# Patient Record
Sex: Male | Born: 1950 | Race: Black or African American | Hispanic: No | Smoking: Current every day smoker
Health system: Southern US, Community
[De-identification: ages and names within clinical notes are randomized; demographics above are authoritative.]

## PROBLEM LIST (undated history)

## (undated) DIAGNOSIS — Z8709 Personal history of other diseases of the respiratory system: Secondary | ICD-10-CM

## (undated) DIAGNOSIS — K219 Gastro-esophageal reflux disease without esophagitis: Secondary | ICD-10-CM

## (undated) DIAGNOSIS — J45909 Unspecified asthma, uncomplicated: Secondary | ICD-10-CM

## (undated) DIAGNOSIS — I10 Essential (primary) hypertension: Secondary | ICD-10-CM

## (undated) DIAGNOSIS — F1010 Alcohol abuse, uncomplicated: Secondary | ICD-10-CM

## (undated) DIAGNOSIS — M1990 Unspecified osteoarthritis, unspecified site: Secondary | ICD-10-CM

## (undated) DIAGNOSIS — R0600 Dyspnea, unspecified: Secondary | ICD-10-CM

## (undated) DIAGNOSIS — C801 Malignant (primary) neoplasm, unspecified: Secondary | ICD-10-CM

## (undated) DIAGNOSIS — K4090 Unilateral inguinal hernia, without obstruction or gangrene, not specified as recurrent: Secondary | ICD-10-CM

## (undated) DIAGNOSIS — R3912 Poor urinary stream: Secondary | ICD-10-CM

## (undated) DIAGNOSIS — Z9189 Other specified personal risk factors, not elsewhere classified: Secondary | ICD-10-CM

## (undated) DIAGNOSIS — R351 Nocturia: Secondary | ICD-10-CM

## (undated) SURGERY — SIGMOIDOSCOPY, FLEXIBLE
Anesthesia: Monitor Anesthesia Care

---

## 1998-03-15 ENCOUNTER — Emergency Department (HOSPITAL_COMMUNITY): Admission: EM | Admit: 1998-03-15 | Discharge: 1998-03-15 | Payer: Self-pay | Admitting: Emergency Medicine

## 2006-04-28 ENCOUNTER — Inpatient Hospital Stay (HOSPITAL_COMMUNITY): Admission: EM | Admit: 2006-04-28 | Discharge: 2006-05-01 | Payer: Self-pay | Admitting: Emergency Medicine

## 2006-04-29 IMAGING — CR DG CHEST 1V PORT
1 series · 1 of 1 positions shown · non-contrast
Comparison: [DATE]

CLINICAL DATA: Fall, no pneumothorax

PORTABLE CHEST - 1 VIEW:

[view not recorded]
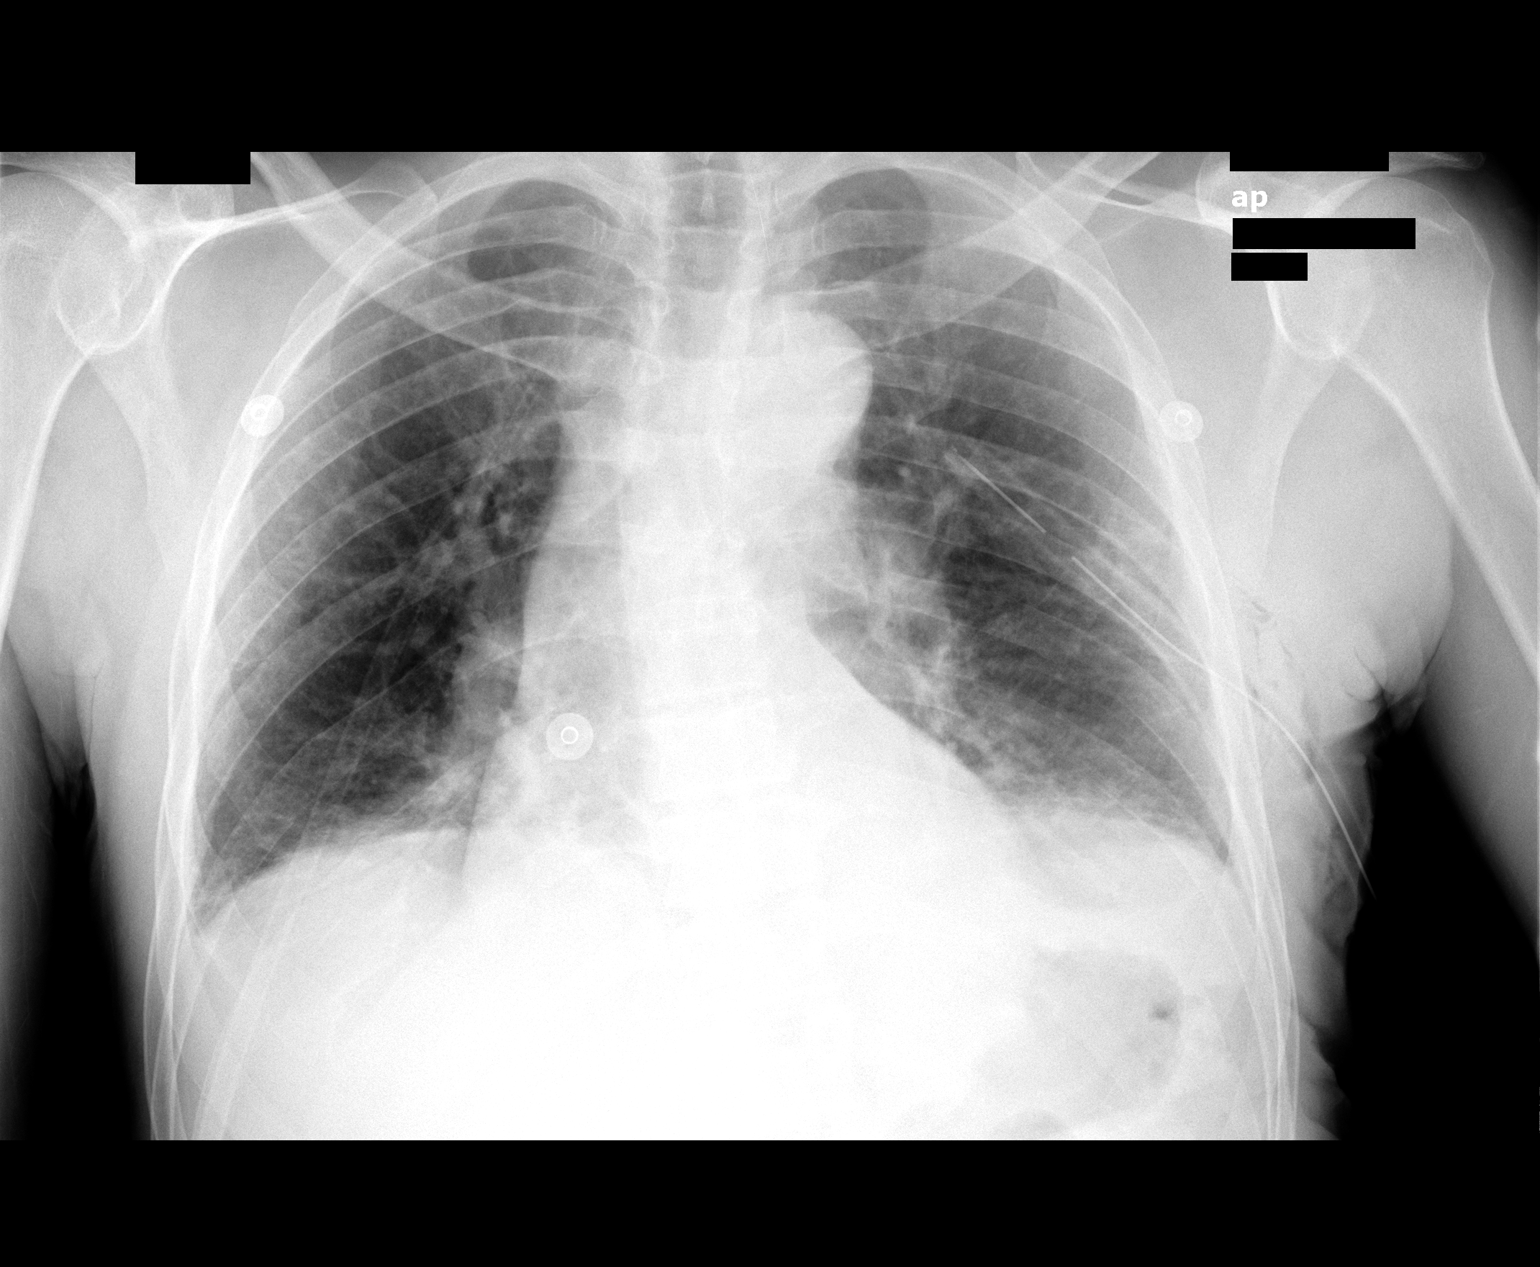

[1 of 1 positions shown; findings below may reference images not displayed]

FINDINGS: Left chest tube remains in place, unchanged. No pneumothorax. Small
amount of subcutaneous air noted on the left, unchanged. There are decreasing
lung volumes with increasing bibasilar atelectasis.
IMPRESSION: No pneumothorax.

Increasing bibasilar atelectasis.

## 2008-03-16 ENCOUNTER — Emergency Department (HOSPITAL_COMMUNITY): Admission: EM | Admit: 2008-03-16 | Discharge: 2008-03-16 | Payer: Self-pay | Admitting: Emergency Medicine

## 2010-10-26 NOTE — Op Note (Signed)
[name], [name] NO.:  [id number]   MEDICAL RECORD NO.:  [id number]          PATIENT TYPE:  INP   LOCATION:  5705                         FACILITY:  [facility]   PHYSICIAN:  Reesa Chew, MD       DATE OF BIRTH:  1951-05-26   DATE OF PROCEDURE:  DATE OF DISCHARGE:                                 OPERATIVE REPORT   PROCEDURE:  Left tube thoracostomy.   SURGEON:  Earney Hamburg, PA-C   ASSIST:  None.   ANESTHESIA:  Versed 5 mg IV, fentanyl 100 mcg IV, and 5 mL lidocaine 1%  without epinephrine subcutaneously.   ESTIMATED BLOOD LOSS:  None.   COMPLICATIONS:  None.   PROCEDURE:  The procedure, its risks and benefits were explained to the  patient, who expressed understanding and signed the consent form.  The  patient had his IV sedation administered, and he was prepped and draped in a  sterile fashion.  Following this, approximately 5 mL of lidocaine was  administered subcutaneously, periosteally, and down to the pleural line.  Then, a 1.5-cm incision was made over rib, and blunt dissection was taken  down to the pleural cavity.  The blunt hemostat was used to puncture the  pleura and was then used to widen the pleural opening.  There was an initial  rush of air from the opening.  This was removed, and a finger was inserted  and swept the chest wall and pleural lining, where there was no signs of  adherence.  Then, a 28-French chest tube with trocar was inserted into the  opening.  Once this made it through the ribs, the trocar was withdrawn and  the chest tube advanced.  This was then sutured in place and connected to a  Pleur-evac at 20 cm of suction.  The patient had a continuous air leak  noted.  Breath sounds improved afterwards, and a portable chest x-ray was  pending.      Earney Hamburg, P.A.    ______________________________  Reesa Chew, MD    MJ/MEDQ  D:  04/28/2006  T:  04/29/2006  Job:  [id number]

## 2010-10-26 NOTE — H&P (Signed)
NAMEDEIDRICK, RAINEY NO.:  [id number]   MEDICAL RECORD NO.:  [id number]          PATIENT TYPE:  INP   LOCATION:  5705                         FACILITY:  [facility]   PHYSICIAN:  Cherylynn Ridges, M.D.    DATE OF BIRTH:  [birth year]   DATE OF ADMISSION:  04/28/2006  DATE OF DISCHARGE:                                HISTORY & PHYSICAL   CHIEF COMPLAINT:  Fall.   HISTORY OF PRESENT ILLNESS:  This is a 60 year old black male who was  walking down a hill when he fell.  There was no loss of consciousness.  He  had immediate left chest wall pain.  However, he was able to get up and go  home.  This fall happened on April 27, 2006.  Over the next 24 hours he  had increasing shortness of breath and chest wall pain and came to the  emergency department for evaluation.  Chest x-ray showed a 30% pneumothorax  on the left side and we were consulted.   PAST MEDICAL HISTORY:  Negative.   SURGICAL HISTORY:  Significant for a left herniorrhaphy.   SOCIAL HISTORY:  Positive for tobacco use and alcohol use of about 40 ounces  of beer per day.  Patient works as a Health visitor.   ALLERGIES:  None.   MEDICATIONS:  None.   REVIEW OF SYSTEMS:  Left chest wall pain and shortness of breath, otherwise  negative for 15 systems.   PHYSICAL EXAM:  VITALS:  Temperature is 99.2, pulse 78, respirations 22 and  mildly labored, blood pressure 125/81, O2 sat is 93% on room air.  GENERAL:  Well-developed, well-nourished black male.  SKIN:  Was warm and dry without ecchymosis, laceration, or edema.  HEAD:  Normocephalic, atraumatic.  Eyes:  Pupils PERRL.  Extraocular  movements intact bilaterally.  No injection, hemorrhage, edema, ecchymosis,  and vision was grossly intact.  Ears:  TMs clear bilaterally.  IACs clear  bilaterally.  Oral mucosa without lesions.  Hearing grossly intact.  Face:  No lesions, edema, ecchymosis.  Facial movement and strength grossly intact.  No obvious oral  trauma or malocclusion.  NECK:  Nontender without lesions.  Range of motion grossly intact without  pain.  LUNGS:  Clear to auscultation but diminished on the left side.  Chest  excursion seemed normal and equal.  CV:  Normal S1, S2.  Regular rate and rhythm without murmurs, rubs, or  gallops.  No auscultated bruits.  Peripheral pulses were palpable.  ABDOMEN:  Soft and nontender with normoactive bowel sounds, no distention  noted.  PELVIC EXAM:  Deferred with the exception of gross pelvic girdle stability  which was intact without tenderness.  MUSCULOSKELETAL:  Patient moves all extremities and there were no deficits  in terms of sensation noted.  No tenderness or deformity.  Back exam was  normal.  NEURO:  GCS was 15.  Patient was alert and oriented x3 with no amnesia or  focal deficits.   X-RAYS:  Chest x-ray showed at least one left-sided rib fracture with a  proximal 30 to 35% pneumothorax noted on the  left side.  He also had a left  clavicle fracture which is likely old.   IMPRESSION:  1. Fall.  2. Left rib fractures with 30 to 35% pneumothorax.  3. Alcohol use.  4. Tobacco use.   PLAN:  Left chest tube will be placed.  Admit to the floor for observation  and chest tube management.      Earney Hamburg, P.A.      Cherylynn Ridges, M.D.  Electronically Signed    MJ/MEDQ  D:  04/28/2006  T:  04/28/2006  Job:  [phone] [phone]

## 2010-10-26 NOTE — Discharge Summary (Signed)
NAMEREADE, TREFZ NO.:  [id number]   MEDICAL RECORD NO.:  [id number]          PATIENT TYPE:  INP   LOCATION:  5705                         FACILITY:  [facility]   PHYSICIAN:  Cherylynn Ridges, M.D.    DATE OF BIRTH:  [birth year]   DATE OF ADMISSION:  04/28/2006  DATE OF DISCHARGE:  05/01/2006                                 DISCHARGE SUMMARY   CONSULTANTS:  None.   DISCHARGE DIAGNOSES:  1. Status post a fall.  2. Left rib fracture x1.  3. Left 30% pneumothorax.  4. EtOH abuse.  5. Tobacco abuse.   PROCEDURES:  Left tube thoracostomy per Dr. Lindie Spruce.   HISTORY ON ADMISSION:  This is a 60 year old black male who was walking down  his hill when he fell down.  There was no loss of consciousness.  He  complained of immediate left chest wall pain but was able to get up and  ambulate and go home.  The fall happened on the day prior to presentation.  He then developed increasing shortness of breath and chest wall pain and  presented to the emergency department for evaluation.  Chest x-ray showed a  left rib fracture x1 at least, and a 30% pneumothorax on the left.  Trauma  services was consulted, and the patient underwent placement of a left chest  tube.  This initially placed to suction but was able to be watersealed after  24 hours.  The chest tube was removed the day following this, and a follow-  up chest x-ray shows no residual pneumothorax.  The patient is ambulatory  and tolerating a regular diet and felt ready for discharge home.   MEDICATIONS ON DISCHARGE:  Vicodin 5/500 mg 1-2 p.o. q.6 h p.r.n. pain #60  no refill.   He will be followed up on May 15, 2006 in trauma clinic for assessment  for return to work.      Shawn Rayburn, P.A.      Cherylynn Ridges, M.D.  Electronically Signed    SR/MEDQ  D:  05/01/2006  T:  05/01/2006  Job:  [id number]

## 2011-03-12 LAB — URINALYSIS, ROUTINE W REFLEX MICROSCOPIC
Bilirubin Urine: NEGATIVE
Hgb urine dipstick: NEGATIVE
Nitrite: NEGATIVE
Protein, ur: NEGATIVE
Specific Gravity, Urine: 1.022
Urobilinogen, UA: 0.2

## 2012-01-18 ENCOUNTER — Encounter (HOSPITAL_COMMUNITY): Payer: Self-pay | Admitting: *Deleted

## 2012-01-18 ENCOUNTER — Emergency Department (HOSPITAL_COMMUNITY): Payer: No Typology Code available for payment source

## 2012-01-18 ENCOUNTER — Emergency Department (HOSPITAL_COMMUNITY): Payer: Self-pay

## 2012-01-18 ENCOUNTER — Emergency Department (HOSPITAL_COMMUNITY)
Admission: EM | Admit: 2012-01-18 | Discharge: 2012-01-18 | Disposition: A | Discharge status: Home / Self Care | Payer: No Typology Code available for payment source | Attending: Emergency Medicine | Admitting: Emergency Medicine

## 2012-01-18 DIAGNOSIS — F17200 Nicotine dependence, unspecified, uncomplicated: Secondary | ICD-10-CM | POA: Insufficient documentation

## 2012-01-18 DIAGNOSIS — R220 Localized swelling, mass and lump, head: Secondary | ICD-10-CM | POA: Insufficient documentation

## 2012-01-18 DIAGNOSIS — IMO0002 Reserved for concepts with insufficient information to code with codable children: Secondary | ICD-10-CM

## 2012-01-18 DIAGNOSIS — S01119A Laceration without foreign body of unspecified eyelid and periocular area, initial encounter: Secondary | ICD-10-CM | POA: Insufficient documentation

## 2012-01-18 DIAGNOSIS — J45909 Unspecified asthma, uncomplicated: Secondary | ICD-10-CM | POA: Insufficient documentation

## 2012-01-18 DIAGNOSIS — M25519 Pain in unspecified shoulder: Secondary | ICD-10-CM | POA: Insufficient documentation

## 2012-01-18 DIAGNOSIS — Z23 Encounter for immunization: Secondary | ICD-10-CM | POA: Insufficient documentation

## 2012-01-18 MED ORDER — SODIUM CHLORIDE 0.9 % IV BOLUS (SEPSIS)
1000.0000 mL | Freq: Once | INTRAVENOUS | Status: AC
  Administered 2012-01-18: 1000 mL via INTRAVENOUS

## 2012-01-18 MED ORDER — TETANUS-DIPHTH-ACELL PERTUSSIS 5-2.5-18.5 LF-MCG/0.5 IM SUSP
0.5000 mL | Freq: Once | INTRAMUSCULAR | Status: AC
  Administered 2012-01-18: 0.5 mL via INTRAMUSCULAR
  Filled 2012-01-18: qty 0.5

## 2012-01-18 MED ORDER — MORPHINE SULFATE 4 MG/ML IJ SOLN
4.0000 mg | Freq: Once | INTRAMUSCULAR | Status: AC
  Administered 2012-01-18: 4 mg via INTRAVENOUS
  Filled 2012-01-18: qty 1

## 2012-01-18 MED ORDER — ONDANSETRON HCL 4 MG/2ML IJ SOLN
4.0000 mg | Freq: Once | INTRAMUSCULAR | Status: AC
  Administered 2012-01-18: 4 mg via INTRAVENOUS
  Filled 2012-01-18: qty 2
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
## 2012-01-19 NOTE — ED Provider Notes (Signed)
Medical screening examination/treatment/procedure(s) were conducted as a shared visit with non-physician practitioner(s) and myself.  I personally evaluated the patient during the encounter.  Derwood Kaplan, MD 01/19/12 1721

## 2014-04-11 ENCOUNTER — Emergency Department (HOSPITAL_COMMUNITY)
Admission: EM | Admit: 2014-04-11 | Discharge: 2014-04-11 | Disposition: A | Discharge status: Home / Self Care | Payer: Medicaid Other | Attending: Emergency Medicine | Admitting: Emergency Medicine

## 2014-04-11 ENCOUNTER — Encounter (HOSPITAL_COMMUNITY): Payer: Self-pay

## 2014-04-11 DIAGNOSIS — L729 Follicular cyst of the skin and subcutaneous tissue, unspecified: Secondary | ICD-10-CM | POA: Diagnosis present

## 2014-04-11 DIAGNOSIS — D1779 Benign lipomatous neoplasm of other sites: Secondary | ICD-10-CM | POA: Insufficient documentation

## 2014-04-11 DIAGNOSIS — Z87828 Personal history of other (healed) physical injury and trauma: Secondary | ICD-10-CM | POA: Insufficient documentation

## 2014-04-11 DIAGNOSIS — Z720 Tobacco use: Secondary | ICD-10-CM | POA: Diagnosis not present

## 2014-04-11 DIAGNOSIS — D1720 Benign lipomatous neoplasm of skin and subcutaneous tissue of unspecified limb: Secondary | ICD-10-CM

## 2014-04-11 MED ORDER — TRAMADOL HCL 50 MG PO TABS: 50.0000 mg | ORAL_TABLET | Freq: Four times a day (QID) | ORAL | Status: DC | PRN

## 2014-04-11 NOTE — Discharge Instructions (Signed)
Take Tramadol as needed for pain. Refer to attached documents for more information. Follow up with a primary care provider from the resource guide below.    Emergency Department Resource Guide 1) Find a Doctor and Pay Out of Pocket Although you won't have to find out who is covered by your insurance plan, it is a good idea to ask around and get recommendations. You will then need to call the office and see if the doctor you have chosen will accept you as a new patient and what types of options they offer for patients who are self-pay. Some doctors offer discounts or will set up payment plans for their patients who do not have insurance, but you will need to ask so you aren't surprised when you get to your appointment.  2) Contact Your Local Health Department Not all health departments have doctors that can see patients for sick visits, but many do, so it is worth a call to see if yours does. If you don't know where your local health department is, you can check in your phone book. The CDC also has a tool to help you locate your state's health department, and many state websites also have listings of all of their local health departments.  3) Find a [facility] If your illness is not likely to be very severe or complicated, you may want to try a walk in clinic. These are popping up all over the country in pharmacies, drugstores, and shopping centers. They're usually staffed by nurse practitioners or physician assistants that have been trained to treat common illnesses and complaints. They're usually fairly quick and inexpensive. However, if you have serious medical issues or chronic medical problems, these are probably not your best option.  No Primary Care Doctor: - Call Health Connect at  [phone] - they can help you locate a primary care doctor that  accepts your insurance, provides certain services, etc. - Physician Referral Service- [phone]  Chronic Pain Problems: Organization          Address  Phone   Notes  [facility]  [phone] Patients need to be referred by their primary care doctor.   Medication Assistance: Organization         Address  Phone   Notes  [facility] Medication [facility] [address] [place], [place] [address] [phone] --Must be a resident of [facility] -- Must have NO insurance coverage whatsoever (no Medicaid/ Medicare, etc.) -- The pt. MUST have a primary care doctor that directs their care regularly and follows them in the community   MedAssist  [phone]   [employer]  [phone]    Agencies that provide inexpensive medical care: Organization         Address  Phone   Notes  [place]  [phone]   [facility]    [phone]   [facility] [place], [place] [address] [phone]   [place] [address] [place] [phone]   Planned Parenthood    [phone]   [facility]    [phone]   [place] and [place] [address],  Phone:  [phone], Fax:  [phone] Hours of Operation:  9 am - 6 pm, M-F.  Also accepts Medicaid/Medicare and self-pay.  [facility] for [place] [address], [address],  [place] Phone: [phone], Fax: [phone]. Hours of Operation:  8:30 am - 5:30 pm, M-F.  Also accepts Medicaid and self-pay.  [facility] [address], [place] Phone: [phone]   [place], [place], [place] [phone], Ext. [phone] Mondays & Thursdays: 7-9 AM.  First 15 patients are seen on a first come, first serve basis.    [place] Providers:  Organization         Address  Phone   Notes  [facility] [address], [place] [phone] Also accepts self-pay patients.  [facility] [address], [place]  [phone]   [place], [address], [place] [phone]   [facility] [address], [place] [phone]   Lucianne Lei [address], [place]   [phone] Only accepts [place] Access [place] patients after they have their name applied to their card.   Self-Pay (no insurance) in [facility]:  Organization         Address  Phone   Notes  Sickle Cell Patients, [facility] [place] [phone]   [facility] [place] [phone]   [facility] [place]  [place], [place], [place] [phone]   [facility]/Dr. Osei-Bonsu  [address], [place] or [address], [place] [phone] Phone number for both [place] and [place] locations is the same.  [facility] [address], [place] [phone]   [facility] [address] [place] or [address] [phone]   [facility] [address], [place] [phone], phone; [phone], fax Sees patients 1st and 3rd Saturday of every month.  Must not qualify for public or private insurance (i.e. Medicaid, Medicare, [place] Health Choice, Veterans' Benefits)  Household income should be no more than 200% of the poverty level The clinic cannot treat you if you are pregnant or think you are pregnant  Sexually transmitted diseases are not treated at the clinic.    Dental Care: Organization         Address  Phone  Notes  [facility] Department of [facility] [phone] Accepts children up to age 47 who are enrolled in [place] or [place]; pregnant women with a Medicaid card; and  children who have applied for Medicaid or [place] Health Choice, but were declined, whose parents can pay a reduced fee at time of service.  [facility] Department of Operating Room Services  [address], [place] [phone] Accepts children up to age 20 who are enrolled in [place] or [place]; pregnant women with a Medicaid card; and children who have applied for Medicaid or [place] Health Choice, but were declined, whose parents can pay a reduced fee at time of service.  [place] Adult [facility] Access PROGRAM  [place] [phone] Patients are seen by appointment only. Walk-ins are not accepted. [place] will see patients 24 years of age and older. Monday - Tuesday (8am-5pm) Most Wednesdays (8:30-5pm) $30 per visit, cash only  [place]  [place]  [address], [facility] [phone] Patients are seen by appointment only. Walk-ins are not accepted. [place] will see patients 60 years of age and older. One Wednesday Evening (Monthly: Volunteer Based).  $30 per visit, cash only  [place]  [phone] for adults; Children under age 70, call Graduate Pediatric Dentistry at [phone]. Children aged 74-14, please call [phone] to request a pediatric application.  Dental services are provided in all areas of dental care including fillings, crowns and bridges, complete and partial dentures, implants, gum treatment, root canals, and extractions. Preventive care is also provided. Treatment is provided to both adults and children. Patients are selected via a lottery and there is often a waiting list.   [facility] [address] [place]  [phone] [email or web address]   [facility] [address] [place], [place] [phone], Ext. [phone] Second and Fourth Thursday of each month, opens at 6:30 AM; Clinic ends at 9 AM.  Patients are seen on a first-come first-served  basis, and a limited number are seen during each clinic.   [facility]  [address] Hillard Danker [place], [place] [phone]   Eligibility Requirements You must have lived in [place], [place], or [place] counties for at least the last three months.   You cannot be eligible for state or federal sponsored [employer], including [employer], [place], or [employer].   You generally cannot be eligible for healthcare insurance through your employer.    How to apply: Eligibility screenings are held every Tuesday and Wednesday afternoon from 1:00 pm until 4:00 pm. You do not need an appointment for the interview!  [facility] [address] [place], [place]   [place]  [place] Department  [place]  [phone]    Behavioral Health Resources in the Community: Intensive Outpatient Programs Organization         Address  Phone  Notes  Floris Vancleave. [address] [place], [place] [phone]   [facility] [address] [place], [place]   ADS: Alcohol & Drug Svcs [address] [place], [place]   [place] [address],  [place], [place] or [phone]   Substance Abuse Resources Organization         Address  Phone  Notes  Alcohol and Drug Services  [phone]   [place]  [phone]   The [place]   Chinita Pester  [phone]   Residential & Outpatient Substance Abuse Program  [phone]   Psychological Services Organization         Address  Phone  Notes  [facility] [place]  [place]  [phone]   [place] [address], [place] or [phone]    Mobile Crisis Teams Organization          Address  Phone  Notes  [facility], Mobile Crisis Care Unit  [phone]   [facility]  [address] [place], [place]   [place] [address], [place] [phone]    Self-Help/Support Groups Organization         Address  Phone             Notes  [place]. of [place] - variety  of support groups  336- [phone] Call for more information  Narcotics Anonymous (NA), [facility] [address], [employer] [place]  2 meetings at this location   Residential Facilities manager         Address  Phone  Notes  [facility] [place],    [place]  [phone]   [facility]  [address], [place] [address], [place], [place]   [place], [place] [phone] Admissions: 8am-3pm M-F  Incentives Substance [place] [address]    [place], [place] [phone]   [facility] [address] [place], [place], [place]   [facility] [address],  [place], [place]   [facility] - Intensive Outpatient [address] Kristeen [address], [place], [place]   [facility] ([place].,  [place], [place] [phone] or [phone]   [facility] Services (RTS) [address] [place], [place] Accepts Medicaid  [facility] [place] [address],  [place] [phone] Substance Abuse/Addiction Treatment   [facility] Organization         Address  Phone  Notes  [facility]  [phone]   Domenic Schwab, PhD [address] Arlis Porta [place], [place]   [phone] or [phone]   [place], [place] [phone]   [facility] [address], [place], [place] [phone] Insurance/Medicaid/sponsorship  through [facility] [place] and Families [address] [place]                                    [place], [place] [phone] [place] [address], [place] [phone]    Dr. Adele Schilder  [phone]   [facility] Dept. 1) [address], [place] 2) [place] 3)  [place] [address], [place] [phone]  [phone]   [place] [phone] or [phone] (After Hours)
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
## 2015-01-26 ENCOUNTER — Ambulatory Visit: Payer: Self-pay | Admitting: General Surgery

## 2015-02-16 ENCOUNTER — Encounter (HOSPITAL_BASED_OUTPATIENT_CLINIC_OR_DEPARTMENT_OTHER): Payer: Self-pay | Admitting: *Deleted

## 2015-02-17 ENCOUNTER — Encounter (HOSPITAL_BASED_OUTPATIENT_CLINIC_OR_DEPARTMENT_OTHER): Payer: Self-pay | Admitting: *Deleted

## 2015-02-17 NOTE — Progress Notes (Addendum)
NPO AFTER MN. ARRIVE AT 1000. WHICH PT STATES IS COMING ON BUS.  AND HIS BROTHER Jason Jones IS PICKING HIM UP.  PT STATES IS GOING TO BACK OFF ON ALCOHOL INTAKE STARTING THIS WEEKEND, AND PT VERBALIZED UNDERSTANDING THAT IF HE CAME IN INTOXICATED , ANESTHESIA WOULD PROBABLY CANCEL HIS CASE.  NEEDS HG.  HAD PT REPEAT INSTRUCTIONS BACK TO ME, PT WAS ABLE TO RECALL THE INSTRUCTIONS.

## 2015-02-22 ENCOUNTER — Encounter (HOSPITAL_BASED_OUTPATIENT_CLINIC_OR_DEPARTMENT_OTHER)
Admission: RE | Disposition: A | Discharge status: Home / Self Care | Payer: Self-pay | Source: Ambulatory Visit | Attending: General Surgery

## 2015-02-22 ENCOUNTER — Ambulatory Visit (HOSPITAL_BASED_OUTPATIENT_CLINIC_OR_DEPARTMENT_OTHER): Payer: Medicare Other | Admitting: Anesthesiology

## 2015-02-22 ENCOUNTER — Encounter (HOSPITAL_BASED_OUTPATIENT_CLINIC_OR_DEPARTMENT_OTHER): Payer: Self-pay | Admitting: *Deleted

## 2015-02-22 ENCOUNTER — Ambulatory Visit (HOSPITAL_BASED_OUTPATIENT_CLINIC_OR_DEPARTMENT_OTHER)
Admission: RE | Admit: 2015-02-22 | Discharge: 2015-02-22 | Disposition: A | Discharge status: Home / Self Care | Payer: Medicare Other | Source: Ambulatory Visit | Attending: General Surgery | Admitting: General Surgery

## 2015-02-22 DIAGNOSIS — D1721 Benign lipomatous neoplasm of skin and subcutaneous tissue of right arm: Secondary | ICD-10-CM | POA: Diagnosis not present

## 2015-02-22 DIAGNOSIS — Z79899 Other long term (current) drug therapy: Secondary | ICD-10-CM | POA: Diagnosis not present

## 2015-02-22 DIAGNOSIS — K219 Gastro-esophageal reflux disease without esophagitis: Secondary | ICD-10-CM | POA: Diagnosis not present

## 2015-02-22 DIAGNOSIS — F17210 Nicotine dependence, cigarettes, uncomplicated: Secondary | ICD-10-CM | POA: Diagnosis not present

## 2015-02-22 DIAGNOSIS — R222 Localized swelling, mass and lump, trunk: Secondary | ICD-10-CM | POA: Diagnosis present

## 2015-02-22 HISTORY — DX: Personal history of other diseases of the respiratory system: Z87.09

## 2015-02-22 HISTORY — DX: Other specified personal risk factors, not elsewhere classified: Z91.89

## 2015-02-22 HISTORY — PX: LIPOMA EXCISION: SHX5283

## 2015-02-22 HISTORY — DX: Gastro-esophageal reflux disease without esophagitis: K21.9

## 2015-02-22 LAB — POCT HEMOGLOBIN-HEMACUE: Hemoglobin: 14.1 g/dL (ref 13.0–17.0)

## 2015-02-22 SURGERY — EXCISION LIPOMA
Anesthesia: General | Site: Shoulder | Laterality: Right

## 2015-02-22 MED ORDER — CEFAZOLIN SODIUM-DEXTROSE 2-3 GM-% IV SOLR
INTRAVENOUS | Status: AC
  Filled 2015-02-22: qty 50

## 2015-02-22 MED ORDER — KETOROLAC TROMETHAMINE 30 MG/ML IJ SOLN
INTRAMUSCULAR | Status: DC | PRN
  Administered 2015-02-22: 30 mg via INTRAVENOUS

## 2015-02-22 MED ORDER — CITRIC ACID-SODIUM CITRATE 334-500 MG/5ML PO SOLN
ORAL | Status: AC
  Filled 2015-02-22: qty 15

## 2015-02-22 MED ORDER — LIDOCAINE HCL (CARDIAC) 20 MG/ML IV SOLN
INTRAVENOUS | Status: DC | PRN
  Administered 2015-02-22: 80 mg via INTRAVENOUS

## 2015-02-22 MED ORDER — METOCLOPRAMIDE HCL 5 MG/ML IJ SOLN
INTRAMUSCULAR | Status: DC | PRN
  Administered 2015-02-22: 10 mg via INTRAVENOUS

## 2015-02-22 MED ORDER — FENTANYL CITRATE (PF) 100 MCG/2ML IJ SOLN
INTRAMUSCULAR | Status: AC
  Filled 2015-02-22: qty 4

## 2015-02-22 MED ORDER — GLYCOPYRROLATE 0.2 MG/ML IJ SOLN
INTRAMUSCULAR | Status: DC | PRN
  Administered 2015-02-22: 0.2 mg via INTRAVENOUS

## 2015-02-22 MED ORDER — FENTANYL CITRATE (PF) 100 MCG/2ML IJ SOLN
25.0000 ug | INTRAMUSCULAR | Status: DC | PRN
  Filled 2015-02-22: qty 1

## 2015-02-22 MED ORDER — CEFAZOLIN SODIUM-DEXTROSE 2-3 GM-% IV SOLR
2.0000 g | INTRAVENOUS | Status: AC
  Administered 2015-02-22: 2 g via INTRAVENOUS
  Filled 2015-02-22: qty 50

## 2015-02-22 MED ORDER — EPHEDRINE SULFATE 50 MG/ML IJ SOLN
INTRAMUSCULAR | Status: DC | PRN
  Administered 2015-02-22: 10 mg via INTRAVENOUS
  Administered 2015-02-22: 20 mg via INTRAVENOUS

## 2015-02-22 MED ORDER — MIDAZOLAM HCL 5 MG/5ML IJ SOLN
INTRAMUSCULAR | Status: DC | PRN
  Administered 2015-02-22 (×2): 1 mg via INTRAVENOUS

## 2015-02-22 MED ORDER — OXYCODONE-ACETAMINOPHEN 5-325 MG PO TABS: 1.0000 | ORAL_TABLET | ORAL | Status: DC | PRN

## 2015-02-22 MED ORDER — SUCCINYLCHOLINE CHLORIDE 20 MG/ML IJ SOLN
INTRAMUSCULAR | Status: DC | PRN
  Administered 2015-02-22: 100 mg via INTRAVENOUS

## 2015-02-22 MED ORDER — PROMETHAZINE HCL 25 MG/ML IJ SOLN
6.2500 mg | INTRAMUSCULAR | Status: DC | PRN
Start: 2015-02-22 — End: 2015-02-22
  Filled 2015-02-22: qty 1

## 2015-02-22 MED ORDER — DEXAMETHASONE SODIUM PHOSPHATE 4 MG/ML IJ SOLN
INTRAMUSCULAR | Status: DC | PRN
  Administered 2015-02-22: 10 mg via INTRAVENOUS

## 2015-02-22 MED ORDER — LACTATED RINGERS IV SOLN
INTRAVENOUS | Status: DC
  Filled 2015-02-22: qty 1000

## 2015-02-22 MED ORDER — PROPOFOL 10 MG/ML IV BOLUS
INTRAVENOUS | Status: DC | PRN
  Administered 2015-02-22: 200 mg via INTRAVENOUS

## 2015-02-22 MED ORDER — CITRIC ACID-SODIUM CITRATE 334-500 MG/5ML PO SOLN
30.0000 mL | Freq: Three times a day (TID) | ORAL | Status: DC
  Filled 2015-02-22: qty 30

## 2015-02-22 MED ORDER — CITRIC ACID-SODIUM CITRATE 334-500 MG/5ML PO SOLN
15.0000 mL | Freq: Three times a day (TID) | ORAL | Status: DC
  Administered 2015-02-22: 15 mL via ORAL
  Filled 2015-02-22: qty 15

## 2015-02-22 MED ORDER — MIDAZOLAM HCL 2 MG/2ML IJ SOLN
INTRAMUSCULAR | Status: AC
  Filled 2015-02-22: qty 2

## 2015-02-22 MED ORDER — FENTANYL CITRATE (PF) 100 MCG/2ML IJ SOLN
INTRAMUSCULAR | Status: DC | PRN
  Administered 2015-02-22: 50 ug via INTRAVENOUS
  Administered 2015-02-22 (×6): 25 ug via INTRAVENOUS

## 2015-02-22 MED ORDER — BUPIVACAINE-EPINEPHRINE (PF) 0.25% -1:200000 IJ SOLN
INTRAMUSCULAR | Status: DC | PRN
  Administered 2015-02-22: 10 mL

## 2015-02-22 MED ORDER — LACTATED RINGERS IV SOLN
INTRAVENOUS | Status: DC
  Administered 2015-02-22 (×3): via INTRAVENOUS
  Filled 2015-02-22: qty 1000

## 2015-02-22 MED ORDER — MEPERIDINE HCL 25 MG/ML IJ SOLN
6.2500 mg | INTRAMUSCULAR | Status: DC | PRN
  Filled 2015-02-22: qty 1

## 2015-02-22 MED ORDER — ONDANSETRON HCL 4 MG/2ML IJ SOLN
INTRAMUSCULAR | Status: DC | PRN
  Administered 2015-02-22: 4 mg via INTRAVENOUS

## 2015-02-22 SURGICAL SUPPLY — 42 items
APL SKNCLS STERI-STRIP NONHPOA (GAUZE/BANDAGES/DRESSINGS) ×1
BENZOIN TINCTURE PRP APPL 2/3 (GAUZE/BANDAGES/DRESSINGS) ×1 IMPLANT
BLADE HEX COATED 2.75 (ELECTRODE) ×2 IMPLANT
BLADE SURG 15 STRL LF DISP TIS (BLADE) ×1 IMPLANT
BLADE SURG 15 STRL SS (BLADE) ×2
CANISTER SUCTION 2500CC (MISCELLANEOUS) ×1 IMPLANT
CHLORAPREP W/TINT 26ML (MISCELLANEOUS) ×2 IMPLANT
COVER BACK TABLE 60X90IN (DRAPES) ×2 IMPLANT
COVER MAYO STAND STRL (DRAPES) ×2 IMPLANT
DRAPE PED LAPAROTOMY (DRAPES) ×2 IMPLANT
DRAPE UTILITY XL STRL (DRAPES) ×2 IMPLANT
DRSG TEGADERM 4X4.75 (GAUZE/BANDAGES/DRESSINGS) ×3 IMPLANT
ELECT REM PT RETURN 9FT ADLT (ELECTROSURGICAL) ×2
ELECTRODE REM PT RTRN 9FT ADLT (ELECTROSURGICAL) ×1 IMPLANT
GLOVE BIO SURGEON STRL SZ 6.5 (GLOVE) ×1 IMPLANT
GLOVE BIO SURGEON STRL SZ7 (GLOVE) ×2 IMPLANT
GLOVE INDICATOR 6.5 STRL GRN (GLOVE) ×1 IMPLANT
GLOVE INDICATOR 7.0 STRL GRN (GLOVE) ×1 IMPLANT
GLOVE INDICATOR 7.5 STRL GRN (GLOVE) ×1 IMPLANT
GOWN STRL REUS W/ TWL LRG LVL3 (GOWN DISPOSABLE) ×1 IMPLANT
GOWN STRL REUS W/TWL LRG LVL3 (GOWN DISPOSABLE) ×4
NDL HYPO 25X1 1.5 SAFETY (NEEDLE) ×1 IMPLANT
NEEDLE HYPO 25X1 1.5 SAFETY (NEEDLE) ×2 IMPLANT
NS IRRIG 500ML POUR BTL (IV SOLUTION) ×1 IMPLANT
PACK BASIN DAY SURGERY FS (CUSTOM PROCEDURE TRAY) ×2 IMPLANT
PENCIL BUTTON HOLSTER BLD 10FT (ELECTRODE) ×2 IMPLANT
SPONGE GAUZE 4X4 12PLY STER LF (GAUZE/BANDAGES/DRESSINGS) ×1 IMPLANT
SPONGE LAP 4X18 X RAY DECT (DISPOSABLE) ×1 IMPLANT
STRIP CLOSURE SKIN 1/2X4 (GAUZE/BANDAGES/DRESSINGS) ×2 IMPLANT
SUCTION FRAZIER TIP 10 FR DISP (SUCTIONS) ×1 IMPLANT
SUT MNCRL AB 4-0 PS2 18 (SUTURE) ×1 IMPLANT
SUT PROLENE 3 0 PS 2 (SUTURE) ×2 IMPLANT
SUT VIC AB 0 CT2 27 (SUTURE) ×1 IMPLANT
SUT VIC AB 2-0 SH 27 (SUTURE)
SUT VIC AB 2-0 SH 27XBRD (SUTURE) IMPLANT
SUT VIC AB 3-0 SH 27 (SUTURE)
SUT VIC AB 3-0 SH 27X BRD (SUTURE) IMPLANT
SYR BULB 3OZ (MISCELLANEOUS) ×1 IMPLANT
SYR CONTROL 10ML LL (SYRINGE) ×2 IMPLANT
TOWEL OR 17X24 6PK STRL BLUE (TOWEL DISPOSABLE) ×2 IMPLANT
TUBE CONNECTING 12X1/4 (SUCTIONS) ×1 IMPLANT
YANKAUER SUCT BULB TIP NO VENT (SUCTIONS) IMPLANT

## 2015-02-22 NOTE — H&P (Signed)
Jason Jones is an 64 y.o. male.   Chief Complaint: right shoulder mass HPI: 64 yo male with right shoulder mass. Mass has slowly enlarged over last 3 years. He notes discomfort and occasional shooting pains.  Past Medical History  Diagnosis Date  . History of pneumothorax     04-28-2006  fell, left fx rib--  resolved w/ chest tube  . Mass of skin of right shoulder   . Alcohol abuse   . GERD (gastroesophageal reflux disease)     PRN  ---  TAKES BAKING SODA IN WATER  . Poor dental hygiene     Past Surgical History  Procedure Laterality Date  . No past surgeries      History reviewed. No pertinent family history. Social History:  reports that he has been smoking Cigarettes.  He has a 67.5 pack-year smoking history. He has never used smokeless tobacco. He reports that he drinks alcohol. He reports that he uses illicit drugs (Marijuana).  Allergies: No Known Allergies  Medications Prior to Admission  Medication Sig Dispense Refill  . Multiple Vitamins-Minerals (CENTRUM SILVER ADULT 50+ PO) Take 1 tablet by mouth daily.    . traMADol (ULTRAM) 50 MG tablet Take 50 mg by mouth every 6 (six) hours as needed.      Results for orders placed or performed during the hospital encounter of 02/22/15 (from the past 48 hour(s))  Hemoglobin-hemacue, POC     Status: None   Collection Time: 02/22/15 11:04 AM  Result Value Ref Range   Hemoglobin 14.1 13.0 - 17.0 g/dL   No results found.  Review of Systems  Constitutional: Negative for fever and chills.  HENT: Negative for hearing loss and tinnitus.   Eyes: Negative for blurred vision and double vision.  Respiratory: Negative for cough and hemoptysis.   Cardiovascular: Negative for chest pain and palpitations.  Gastrointestinal: Negative for heartburn, nausea and abdominal pain.  Genitourinary: Negative for urgency.  Musculoskeletal: Negative for back pain and neck pain.  Skin: Negative for itching and rash.  Neurological: Negative for  dizziness and tingling.  Endo/Heme/Allergies: Negative for environmental allergies. Does not bruise/bleed easily.    Blood pressure 143/92, pulse 89, temperature 98.9 F (37.2 C), temperature source Oral, resp. rate 16, height 5\' 11"  (1.803 m), weight 67.359 kg (148 lb 8 oz), SpO2 98 %. Physical Exam  Constitutional: He is oriented to person, place, and time. He appears well-developed and well-nourished.  HENT:  Head: Normocephalic and atraumatic.  Eyes: Pupils are equal, round, and reactive to light.  Neck: Normal range of motion. Neck supple.  Cardiovascular: Normal rate and regular rhythm.   Respiratory: Effort normal and breath sounds normal.  GI: Soft. Bowel sounds are normal.  Musculoskeletal: Normal range of motion.  Neurological: He is alert and oriented to person, place, and time.  Skin: Skin is warm and dry. Lesion noted.  Right shoulder mass, mobile to underlying structures, 10cm in largest axis.  Psychiatric: He has a normal mood and affect. His behavior is normal.     Assessment/Plan 64 yo male with subcutaneous right shoulder mass. -discussed risks alternatives and benefits including recurrence, infection, postoperative pain, injury to joint capsule or tendons/ligaments/nerves and that this is likely not malignant. He showed understanding and agreed to proceed with surgery. -excise mass in day surgery.  Arta Bruce Kinsinger 02/22/2015, 11:13 AM

## 2015-02-22 NOTE — Anesthesia Preprocedure Evaluation (Signed)
Anesthesia Evaluation  Patient identified by MRN, date of birth, ID band Patient awake    Reviewed: Allergy & Precautions, NPO status , Patient's Chart, lab work & pertinent test results  Airway Mallampati: II  TM Distance: >3 FB Neck ROM: Full    Dental no notable dental hx. (+) Missing, Poor Dentition   Pulmonary Current Smoker,    Pulmonary exam normal breath sounds clear to auscultation       Cardiovascular negative cardio ROS Normal cardiovascular exam Rhythm:Regular Rate:Normal     Neuro/Psych negative neurological ROS  negative psych ROS   GI/Hepatic GERD  ,(+)     substance abuse  alcohol use,   Endo/Other  negative endocrine ROS  Renal/GU negative Renal ROS  negative genitourinary   Musculoskeletal negative musculoskeletal ROS (+)   Abdominal   Peds negative pediatric ROS (+)  Hematology negative hematology ROS (+)   Anesthesia Other Findings   Reproductive/Obstetrics negative OB ROS                             Anesthesia Physical Anesthesia Plan  ASA: III  Anesthesia Plan: General   Post-op Pain Management:    Induction: Intravenous, Rapid sequence and Cricoid pressure planned  Airway Management Planned: Oral ETT  Additional Equipment:   Intra-op Plan:   Post-operative Plan: Extubation in OR  Informed Consent: I have reviewed the patients History and Physical, chart, labs and discussed the procedure including the risks, benefits and alternatives for the proposed anesthesia with the patient or authorized representative who has indicated his/her understanding and acceptance.   Dental advisory given  Plan Discussed with: CRNA  Anesthesia Plan Comments: (bicitra pre-op)        Anesthesia Quick Evaluation

## 2015-02-22 NOTE — Anesthesia Postprocedure Evaluation (Signed)
  Anesthesia Post-op Note  Patient: Jason Jones  Procedure(s) Performed: Procedure(s) (LRB): RIGHT SHOULDER MASS EXCISION 15 CM SQ (Right)  Patient Location: PACU  Anesthesia Type: General  Level of Consciousness: awake and alert   Airway and Oxygen Therapy: Patient Spontanous Breathing  Post-op Pain: mild  Post-op Assessment: Post-op Vital signs reviewed, Patient's Cardiovascular Status Stable, Respiratory Function Stable, Patent Airway and No signs of Nausea or vomiting  Last Vitals:  Filed Vitals:   02/22/15 1400  BP: 133/101  Pulse: 98  Temp:   Resp: 15    Post-op Vital Signs: stable   Complications: No apparent anesthesia complications

## 2015-02-22 NOTE — Discharge Instructions (Signed)

## 2015-02-22 NOTE — Transfer of Care (Signed)
Immediate Anesthesia Transfer of Care Note  Patient: Jason Jones  Procedure(s) Performed: Procedure(s) (LRB): RIGHT SHOULDER MASS EXCISION 15 CM SQ (Right)  Patient Location: PACU  Anesthesia Type: General  Level of Consciousness: awake, sedated, patient cooperative and responds to stimulation  Airway & Oxygen Therapy: Patient Spontanous Breathing and Patient connected to face mask oxygen  Post-op Assessment: Report given to PACU RN, Post -op Vital signs reviewed and stable and Patient moving all extremities  Post vital signs: Reviewed and stable  Complications: No apparent anesthesia complications

## 2015-02-22 NOTE — Anesthesia Procedure Notes (Signed)
Procedure Name: Intubation Date/Time: 02/22/2015 12:30 PM Performed by: Justice Rocher Pre-anesthesia Checklist: Patient identified, Emergency Drugs available, Suction available and Patient being monitored Patient Re-evaluated:Patient Re-evaluated prior to inductionOxygen Delivery Method: Circle System Utilized Preoxygenation: Pre-oxygenation with 100% oxygen Intubation Type: IV induction, Rapid sequence and Cricoid Pressure applied Ventilation: Mask ventilation without difficulty Laryngoscope Size: Mac and 4 Tube type: Oral Tube size: 7.0 mm Number of attempts: 1 Airway Equipment and Method: Stylet and Oral airway Placement Confirmation: ETT inserted through vocal cords under direct vision,  positive ETCO2 and breath sounds checked- equal and bilateral Secured at: 22 cm Tube secured with: Tape Dental Injury: Teeth and Oropharynx as per pre-operative assessment

## 2015-02-22 NOTE — Op Note (Signed)
Preoperative diagnosis: R shoulder mass Postoperative diagnosis: lipoma of R shoulder  Procedure: excision of 15cm subcutaneous mass of right shoulder. Intermediate incision closure  Surgeon: Gurney Maxin, M.D.   Anesthesia: Gen.   Indications for procedure: Jason Jones is a 64 y.o. male with symptoms of R shoulder pain and slowly enlarging mass.  Description of procedure: The patient was brought into the operative suite, placed supine. Anesthesia was administered with endotracheal tube. Patient was strapped in place and foot board was secured. All pressure points were offloaded by foam padding. The patient was prepped and draped in the usual sterile fashion.  Next marcaine was infiltrated into the dermal layer and a longitudinal incision was made directly over the mass. Once past the dermis, blunt dissection was used to completely encircle the mass. There was a large fibrous capsule which was separated from the dermis and surrounding tissue with cautery. Finally, cautery was used to dissect the mass away from the fascia of the deltoid muscle without entering the muscle body. The mass was removed and sent to pathology in formalin. Hemostasis was applied with cautery. A 0 vicryl was used for a deep dermal suture and also sutured the dermal layer to the deltoid fascia in 2 places. Finally, a 4-0 monocryl was placed as a running subcuticular suture. Steristrips, gauze and tegaderm were used for dressing. Patient awoke from anesthesia and was brought to pacu in stable condition.   Findings: 15cm yellow mass  Specimen: right shoulder mass (short stitch superior, long stitch posterior)  Blood loss: <50cc  Local anesthesia: 10cc 0.25% marcaine with epi  Complications: none  Gurney Maxin, M.D. General, Bariatric, & Minimally Invasive Surgery [facility] [place]

## 2015-02-22 NOTE — OR Nursing (Signed)
Specimen: right shoulder mass: long stitch = posterior, short stitch = superior. Pathology notified of marking and written on printed requistion.

## 2015-02-23 ENCOUNTER — Encounter (HOSPITAL_BASED_OUTPATIENT_CLINIC_OR_DEPARTMENT_OTHER): Payer: Self-pay | Admitting: General Surgery

## 2017-03-27 ENCOUNTER — Ambulatory Visit: Payer: Self-pay | Admitting: General Surgery

## 2017-04-17 ENCOUNTER — Other Ambulatory Visit: Payer: Self-pay

## 2017-04-17 ENCOUNTER — Encounter (HOSPITAL_BASED_OUTPATIENT_CLINIC_OR_DEPARTMENT_OTHER): Payer: Self-pay | Admitting: *Deleted

## 2017-04-17 NOTE — Progress Notes (Signed)
NPO AFTER MN.  ARRIVE AT 0830.  GETTING  LAB WORK DONE Monday 04-21-2017 (CBCdiff, BMET).   PT ADVISED TO CUT DOWN ALCOHOL INTAKE NOW UNTIL SURGERY BECAUSE ANESTHESIA WILL POSSIBLY CANCEL HIS SURGERY IF THEY FEEL HE IS INTOXICATED.  PT VERBALIZED UNDERSTANDING OF ADVISEMENT AND INSTRUCTIONS.  PT BROTHER IS DROPPING HIM OFF AND PICKING UP AT DISCHARGE.

## 2017-04-21 ENCOUNTER — Encounter (HOSPITAL_COMMUNITY)
Admission: RE | Admit: 2017-04-21 | Discharge: 2017-04-21 | Disposition: A | Discharge status: Home / Self Care | Payer: Medicare HMO | Source: Ambulatory Visit | Attending: General Surgery | Admitting: General Surgery

## 2017-04-21 DIAGNOSIS — J449 Chronic obstructive pulmonary disease, unspecified: Secondary | ICD-10-CM | POA: Diagnosis not present

## 2017-04-21 DIAGNOSIS — Z7982 Long term (current) use of aspirin: Secondary | ICD-10-CM | POA: Diagnosis not present

## 2017-04-21 DIAGNOSIS — K469 Unspecified abdominal hernia without obstruction or gangrene: Secondary | ICD-10-CM | POA: Diagnosis present

## 2017-04-21 DIAGNOSIS — F17210 Nicotine dependence, cigarettes, uncomplicated: Secondary | ICD-10-CM | POA: Diagnosis not present

## 2017-04-21 DIAGNOSIS — K4090 Unilateral inguinal hernia, without obstruction or gangrene, not specified as recurrent: Secondary | ICD-10-CM | POA: Diagnosis not present

## 2017-04-21 LAB — BASIC METABOLIC PANEL
ANION GAP: 6 (ref 5–15)
BUN: 17 mg/dL (ref 6–20)
CALCIUM: 9.6 mg/dL (ref 8.9–10.3)
CO2: 28 mmol/L (ref 22–32)
Chloride: 106 mmol/L (ref 101–111)
Creatinine, Ser: 1.22 mg/dL (ref 0.61–1.24)
GFR calc non Af Amer: 60 mL/min — ABNORMAL LOW (ref >60)
Glucose, Bld: 101 mg/dL — ABNORMAL HIGH (ref 65–99)
Potassium: 4 mmol/L (ref 3.5–5.1)
Sodium: 140 mmol/L (ref 135–145)

## 2017-04-21 LAB — CBC WITH DIFFERENTIAL/PLATELET
BASOS ABS: 0 10*3/uL (ref 0.0–0.1)
BASOS PCT: 0 %
Eosinophils Absolute: 0.2 10*3/uL (ref 0.0–0.7)
Eosinophils Relative: 4 %
HEMATOCRIT: 44.5 % (ref 39.0–52.0)
HEMOGLOBIN: 15 g/dL (ref 13.0–17.0)
Lymphocytes Relative: 44 %
Lymphs Abs: 2.4 10*3/uL (ref 0.7–4.0)
MCH: 31.6 pg (ref 26.0–34.0)
MCHC: 33.7 g/dL (ref 30.0–36.0)
MCV: 93.7 fL (ref 78.0–100.0)
MONOS PCT: 10 %
Monocytes Absolute: 0.5 10*3/uL (ref 0.1–1.0)
NEUTROS ABS: 2.3 10*3/uL (ref 1.7–7.7)
NEUTROS PCT: 42 %
PLATELETS: 249 10*3/uL (ref 150–400)
RBC: 4.75 MIL/uL (ref 4.22–5.81)
RDW: 14.4 % (ref 11.5–15.5)
WBC: 5.5 10*3/uL (ref 4.0–10.5)

## 2017-04-23 ENCOUNTER — Ambulatory Visit (HOSPITAL_BASED_OUTPATIENT_CLINIC_OR_DEPARTMENT_OTHER)
Admission: RE | Admit: 2017-04-23 | Discharge: 2017-04-23 | Disposition: A | Discharge status: Home / Self Care | Payer: Medicare HMO | Source: Ambulatory Visit | Attending: General Surgery | Admitting: General Surgery

## 2017-04-23 ENCOUNTER — Encounter (HOSPITAL_BASED_OUTPATIENT_CLINIC_OR_DEPARTMENT_OTHER): Payer: Self-pay

## 2017-04-23 ENCOUNTER — Ambulatory Visit (HOSPITAL_BASED_OUTPATIENT_CLINIC_OR_DEPARTMENT_OTHER): Payer: Medicare HMO | Admitting: Anesthesiology

## 2017-04-23 ENCOUNTER — Encounter (HOSPITAL_BASED_OUTPATIENT_CLINIC_OR_DEPARTMENT_OTHER)
Admission: RE | Disposition: A | Discharge status: Home / Self Care | Payer: Self-pay | Source: Ambulatory Visit | Attending: General Surgery

## 2017-04-23 DIAGNOSIS — K4090 Unilateral inguinal hernia, without obstruction or gangrene, not specified as recurrent: Secondary | ICD-10-CM | POA: Diagnosis not present

## 2017-04-23 DIAGNOSIS — J449 Chronic obstructive pulmonary disease, unspecified: Secondary | ICD-10-CM | POA: Insufficient documentation

## 2017-04-23 DIAGNOSIS — F17210 Nicotine dependence, cigarettes, uncomplicated: Secondary | ICD-10-CM | POA: Diagnosis not present

## 2017-04-23 DIAGNOSIS — Z7982 Long term (current) use of aspirin: Secondary | ICD-10-CM | POA: Diagnosis not present

## 2017-04-23 HISTORY — DX: Unilateral inguinal hernia, without obstruction or gangrene, not specified as recurrent: K40.90

## 2017-04-23 HISTORY — PX: INGUINAL HERNIA REPAIR: SHX194

## 2017-04-23 HISTORY — PX: INSERTION OF MESH: SHX5868

## 2017-04-23 HISTORY — DX: Poor urinary stream: R39.12

## 2017-04-23 HISTORY — DX: Alcohol abuse, uncomplicated: F10.10

## 2017-04-23 HISTORY — DX: Unspecified osteoarthritis, unspecified site: M19.90

## 2017-04-23 HISTORY — DX: Nocturia: R35.1

## 2017-04-23 SURGERY — REPAIR, HERNIA, INGUINAL, ADULT
Anesthesia: General | Site: Inguinal | Laterality: Right

## 2017-04-23 MED ORDER — GABAPENTIN 300 MG PO CAPS
ORAL_CAPSULE | ORAL | Status: AC
  Filled 2017-04-23: qty 1

## 2017-04-23 MED ORDER — LABETALOL HCL 5 MG/ML IV SOLN
INTRAVENOUS | Status: AC
  Filled 2017-04-23: qty 4

## 2017-04-23 MED ORDER — ACETAMINOPHEN 500 MG PO TABS
ORAL_TABLET | ORAL | Status: AC
  Filled 2017-04-23: qty 2

## 2017-04-23 MED ORDER — PROPOFOL 10 MG/ML IV BOLUS
INTRAVENOUS | Status: AC
  Filled 2017-04-23: qty 40

## 2017-04-23 MED ORDER — IBUPROFEN 800 MG PO TABS: 800.0000 mg | ORAL_TABLET | Freq: Three times a day (TID) | ORAL | 0 refills | Status: DC | PRN

## 2017-04-23 MED ORDER — DEXAMETHASONE SODIUM PHOSPHATE 10 MG/ML IJ SOLN
INTRAMUSCULAR | Status: DC | PRN
  Administered 2017-04-23: 10 mg via INTRAVENOUS

## 2017-04-23 MED ORDER — MEPERIDINE HCL 25 MG/ML IJ SOLN
6.2500 mg | INTRAMUSCULAR | Status: DC | PRN
  Filled 2017-04-23: qty 1

## 2017-04-23 MED ORDER — FENTANYL CITRATE (PF) 100 MCG/2ML IJ SOLN
INTRAMUSCULAR | Status: AC
  Filled 2017-04-23: qty 2

## 2017-04-23 MED ORDER — LIDOCAINE 2% (20 MG/ML) 5 ML SYRINGE
INTRAMUSCULAR | Status: AC
  Filled 2017-04-23: qty 5

## 2017-04-23 MED ORDER — LIDOCAINE 2% (20 MG/ML) 5 ML SYRINGE
INTRAMUSCULAR | Status: DC | PRN
  Administered 2017-04-23: 100 mg via INTRAVENOUS

## 2017-04-23 MED ORDER — MIDAZOLAM HCL 2 MG/2ML IJ SOLN
0.5000 mg | Freq: Once | INTRAMUSCULAR | Status: DC | PRN
  Filled 2017-04-23: qty 2

## 2017-04-23 MED ORDER — BUPIVACAINE-EPINEPHRINE (PF) 0.5% -1:200000 IJ SOLN
INTRAMUSCULAR | Status: DC | PRN
  Administered 2017-04-23: 30 mL

## 2017-04-23 MED ORDER — SODIUM CHLORIDE 0.9 % IR SOLN
Status: DC | PRN
  Administered 2017-04-23: 500 mL

## 2017-04-23 MED ORDER — PHENYLEPHRINE 40 MCG/ML (10ML) SYRINGE FOR IV PUSH (FOR BLOOD PRESSURE SUPPORT)
PREFILLED_SYRINGE | INTRAVENOUS | Status: AC
  Filled 2017-04-23: qty 10

## 2017-04-23 MED ORDER — OXYCODONE HCL 5 MG PO TABS
5.0000 mg | ORAL_TABLET | Freq: Once | ORAL | Status: AC
  Administered 2017-04-23: 5 mg via ORAL
  Filled 2017-04-23: qty 1

## 2017-04-23 MED ORDER — ONDANSETRON HCL 4 MG/2ML IJ SOLN
INTRAMUSCULAR | Status: AC
Start: 2017-04-23 — End: ?
  Filled 2017-04-23: qty 2

## 2017-04-23 MED ORDER — HYDROCODONE-ACETAMINOPHEN 5-325 MG PO TABS: 1.0000 | ORAL_TABLET | Freq: Four times a day (QID) | ORAL | 0 refills | Status: DC | PRN

## 2017-04-23 MED ORDER — GABAPENTIN 300 MG PO CAPS
300.0000 mg | ORAL_CAPSULE | ORAL | Status: AC
  Administered 2017-04-23: 300 mg via ORAL
  Filled 2017-04-23: qty 1

## 2017-04-23 MED ORDER — CEFAZOLIN SODIUM-DEXTROSE 2-4 GM/100ML-% IV SOLN
2.0000 g | INTRAVENOUS | Status: AC
  Administered 2017-04-23: 2 g via INTRAVENOUS
  Filled 2017-04-23: qty 100

## 2017-04-23 MED ORDER — FENTANYL CITRATE (PF) 100 MCG/2ML IJ SOLN
INTRAMUSCULAR | Status: DC | PRN
  Administered 2017-04-23: 25 ug via INTRAVENOUS
  Administered 2017-04-23: 50 ug via INTRAVENOUS
  Administered 2017-04-23: 25 ug via INTRAVENOUS

## 2017-04-23 MED ORDER — MIDAZOLAM HCL 2 MG/2ML IJ SOLN
INTRAMUSCULAR | Status: AC
  Filled 2017-04-23: qty 2

## 2017-04-23 MED ORDER — PROMETHAZINE HCL 25 MG/ML IJ SOLN
6.2500 mg | INTRAMUSCULAR | Status: DC | PRN
  Filled 2017-04-23: qty 1

## 2017-04-23 MED ORDER — ACETAMINOPHEN 500 MG PO TABS
1000.0000 mg | ORAL_TABLET | ORAL | Status: AC
  Administered 2017-04-23: 1000 mg via ORAL
  Filled 2017-04-23: qty 2

## 2017-04-23 MED ORDER — PHENYLEPHRINE 40 MCG/ML (10ML) SYRINGE FOR IV PUSH (FOR BLOOD PRESSURE SUPPORT)
PREFILLED_SYRINGE | INTRAVENOUS | Status: DC | PRN
  Administered 2017-04-23: 80 ug via INTRAVENOUS

## 2017-04-23 MED ORDER — EPHEDRINE 5 MG/ML INJ
INTRAVENOUS | Status: AC
  Filled 2017-04-23: qty 10

## 2017-04-23 MED ORDER — LACTATED RINGERS IV SOLN
INTRAVENOUS | Status: DC
  Administered 2017-04-23 (×3): via INTRAVENOUS
  Filled 2017-04-23: qty 1000

## 2017-04-23 MED ORDER — FENTANYL CITRATE (PF) 100 MCG/2ML IJ SOLN
25.0000 ug | INTRAMUSCULAR | Status: DC | PRN
  Administered 2017-04-23: 50 ug via INTRAVENOUS
  Administered 2017-04-23: 25 ug via INTRAVENOUS
  Filled 2017-04-23: qty 1

## 2017-04-23 MED ORDER — LABETALOL HCL 5 MG/ML IV SOLN
5.0000 mg | INTRAVENOUS | Status: DC | PRN
  Administered 2017-04-23: 5 mg via INTRAVENOUS
  Filled 2017-04-23: qty 4

## 2017-04-23 MED ORDER — PROPOFOL 10 MG/ML IV BOLUS
INTRAVENOUS | Status: DC | PRN
  Administered 2017-04-23: 170 mg via INTRAVENOUS

## 2017-04-23 MED ORDER — OXYCODONE HCL 5 MG PO TABS
ORAL_TABLET | ORAL | Status: AC
  Filled 2017-04-23: qty 1

## 2017-04-23 MED ORDER — CEFAZOLIN SODIUM-DEXTROSE 2-4 GM/100ML-% IV SOLN
INTRAVENOUS | Status: AC
  Filled 2017-04-23: qty 100

## 2017-04-23 MED ORDER — EPHEDRINE SULFATE-NACL 50-0.9 MG/10ML-% IV SOSY
PREFILLED_SYRINGE | INTRAVENOUS | Status: DC | PRN
  Administered 2017-04-23 (×3): 10 mg via INTRAVENOUS

## 2017-04-23 MED ORDER — MIDAZOLAM HCL 2 MG/2ML IJ SOLN
2.0000 mg | Freq: Once | INTRAMUSCULAR | Status: AC
  Administered 2017-04-23: 2 mg via INTRAVENOUS
  Filled 2017-04-23: qty 2

## 2017-04-23 MED ORDER — FENTANYL CITRATE (PF) 100 MCG/2ML IJ SOLN
100.0000 ug | Freq: Once | INTRAMUSCULAR | Status: AC
  Administered 2017-04-23: 100 ug via INTRAVENOUS
  Filled 2017-04-23: qty 2

## 2017-04-23 MED ORDER — CHLORHEXIDINE GLUCONATE CLOTH 2 % EX PADS
6.0000 | MEDICATED_PAD | Freq: Once | CUTANEOUS | Status: DC
  Filled 2017-04-23: qty 6

## 2017-04-23 MED ORDER — ONDANSETRON HCL 4 MG/2ML IJ SOLN
INTRAMUSCULAR | Status: DC | PRN
  Administered 2017-04-23: 4 mg via INTRAVENOUS

## 2017-04-23 MED ORDER — DEXAMETHASONE SODIUM PHOSPHATE 10 MG/ML IJ SOLN
INTRAMUSCULAR | Status: AC
  Filled 2017-04-23: qty 1

## 2017-04-23 SURGICAL SUPPLY — 46 items
ADH SKN CLS APL DERMABOND .7 (GAUZE/BANDAGES/DRESSINGS) ×1
BLADE CLIPPER SENSICLIP SURGIC (BLADE) ×2 IMPLANT
BLADE HEX COATED 2.75 (ELECTRODE) ×2 IMPLANT
BLADE SURG 15 STRL LF DISP TIS (BLADE) ×1 IMPLANT
BLADE SURG 15 STRL SS (BLADE) ×2
CANISTER SUCT 3000ML PPV (MISCELLANEOUS) IMPLANT
CELLS DAT CNTRL 66122 CELL SVR (MISCELLANEOUS) IMPLANT
CHLORAPREP W/TINT 26ML (MISCELLANEOUS) ×2 IMPLANT
COVER BACK TABLE 60X90IN (DRAPES) ×2 IMPLANT
COVER MAYO STAND STRL (DRAPES) ×2 IMPLANT
DERMABOND ADVANCED (GAUZE/BANDAGES/DRESSINGS) ×1
DERMABOND ADVANCED .7 DNX12 (GAUZE/BANDAGES/DRESSINGS) ×1 IMPLANT
DRAIN PENROSE 18X1/4 LTX STRL (WOUND CARE) IMPLANT
DRAPE LAPAROSCOPIC ABDOMINAL (DRAPES) ×2 IMPLANT
DRAPE UTILITY XL STRL (DRAPES) ×2 IMPLANT
ELECT REM PT RETURN 9FT ADLT (ELECTROSURGICAL) ×2
ELECTRODE REM PT RTRN 9FT ADLT (ELECTROSURGICAL) ×1 IMPLANT
GLOVE BIOGEL PI IND STRL 7.0 (GLOVE) ×1 IMPLANT
GLOVE BIOGEL PI INDICATOR 7.0 (GLOVE) ×1
GLOVE SURG SS PI 7.0 STRL IVOR (GLOVE) ×2 IMPLANT
GOWN STRL REUS W/ TWL LRG LVL3 (GOWN DISPOSABLE) ×2 IMPLANT
GOWN STRL REUS W/TWL LRG LVL3 (GOWN DISPOSABLE) ×4
KIT RM TURNOVER CYSTO AR (KITS) ×2 IMPLANT
MESH BARD SOFT 3X6IN (Mesh General) ×1 IMPLANT
NDL HYPO 25X1 1.5 SAFETY (NEEDLE) ×1 IMPLANT
NEEDLE HYPO 25X1 1.5 SAFETY (NEEDLE) IMPLANT
NS IRRIG 500ML POUR BTL (IV SOLUTION) ×2 IMPLANT
PACK BASIN DAY SURGERY FS (CUSTOM PROCEDURE TRAY) ×2 IMPLANT
PAD ARMBOARD 7.5X6 YLW CONV (MISCELLANEOUS) ×1 IMPLANT
PENCIL BUTTON HOLSTER BLD 10FT (ELECTRODE) ×2 IMPLANT
RETRACTOR WND ALEXIS 18 MED (MISCELLANEOUS) IMPLANT
RTRCTR WOUND ALEXIS 18CM MED (MISCELLANEOUS)
SPONGE LAP 4X18 X RAY DECT (DISPOSABLE) ×1 IMPLANT
SUT MNCRL AB 4-0 PS2 18 (SUTURE) ×2 IMPLANT
SUT PROLENE 2 0 CT2 30 (SUTURE) ×4 IMPLANT
SUT SILK 3 0 TIES 17X18 (SUTURE)
SUT SILK 3-0 18XBRD TIE BLK (SUTURE) IMPLANT
SUT VIC AB 2-0 SH 27 (SUTURE) ×4
SUT VIC AB 2-0 SH 27XBRD (SUTURE) ×1 IMPLANT
SUT VIC AB 3-0 SH 27 (SUTURE) ×2
SUT VIC AB 3-0 SH 27X BRD (SUTURE) ×1 IMPLANT
SYR BULB IRRIGATION 50ML (SYRINGE) ×2 IMPLANT
SYR CONTROL 10ML LL (SYRINGE) ×1 IMPLANT
TOWEL OR 17X24 6PK STRL BLUE (TOWEL DISPOSABLE) ×3 IMPLANT
TUBE CONNECTING 12X1/4 (SUCTIONS) ×2 IMPLANT
YANKAUER SUCT BULB TIP NO VENT (SUCTIONS) ×2 IMPLANT

## 2017-04-23 NOTE — Op Note (Signed)
Preop diagnosis: right inguinal hernia  Postop diagnosis: right indirect inguinal hernia  Procedure: open Right inguinal hernia repair with mesh  Surgeon: Gurney Maxin, M.D.  Asst: none  Anesthesia: Gen.   Indications for procedure: Jason Jones is a 66 y.o. male with symptoms of pain and enlarging Right inguinal hernia(s). After discussing risks, alternatives and benefits he decided on open repair and was brought to day surgery for repair. He underwent preoperative block by anesthesia.  Description of procedure: The patient was brought into the operative suite, placed supine. Anesthesia was administered with endotracheal tube. Patient was strapped in place. The patient was prepped and draped in the usual sterile fashion.  The anterior superior iliac spine and pubic tubercle were identified on the Right side. An incision was made 1cm above the connecting line, representative of the location of the inguinal ligament. The subcutaneous tissue was bluntly dissected, scarpa's fascia was dissected away. The external abdominal oblique fascia was identified and sharply opened down to the external inguinal ring. The conjoint tendon and inguinal ligament were identified. The cord structures and sac were dissected free of the surrounding tissue in 360 degrees. A penrose drain was used to encircle the contents. The cremasteric fibers were dissected free of the contents of the cord and hernia sac. The cord structures (vessels and vas deferens) were identified and carefully dissected away from the hernia sac. The hernia sac was fatty in nature. The hernia sac was dissected down to the internal inguinal ring. Preperitoneal fat was identified showing appropriate dissection. The hernia sac was opened to ensure reduction and ligated and a running 2-0 used to close the sac. The sac was then reduced into the preperitoneal space. A 3x6 Bard Soft mesh was then used to close the defect and reinforce the floor. The mesh  was sutured to the lacunar ligament and inguinal ligament using a 2-0 prolene in running fashion. Next the superior edge of the mesh was sutured to the conjoined tendon using a 2-0 running Prolene. An additional 2-0 Prolene was used to suture the tail ends of the mesh together re-creating the deep ring. Cord structures are running in a neutral position through the mesh. Next the external abdominal oblique fascia was closed with a 2-0 Vicryl in running fashion to re-create the external inguinal ring. Scarpa's fascia was closed with 3-0 Vicryl in running fashion. Skin was closed with a 4-0 Monocryl subcuticular stitch in running fashion. Dermabond place for dressing. Patient woke from anesthesia and brought to PACU in stable condition. All counts are correct.    Findings: right indirect inguinal hernia  Specimen: none  Blood loss: <30 ml  Local anesthesia: none  Complications: none  Implant: bard soft 3x6in mesh  Gurney Maxin, M.D. General, Bariatric, & Minimally Invasive Surgery [facility], [place] 11:37 AM 04/23/2017

## 2017-04-23 NOTE — H&P (Signed)
Jason Jones is an 66 y.o. male.   Chief Complaint: hernia HPI: 66 yo male with mutliple years of hernia. It has been enlarging and more painful and he presents for repair.  Past Medical History:  Diagnosis Date  . Arthritis   . Chronic alcohol abuse   . GERD (gastroesophageal reflux disease)    PRN  ---  TAKES BAKING SODA IN WATER  . History of pneumothorax    04-28-2006  fell, left fx rib--  resolved w/ chest tube  . Nocturia   . Poor dental hygiene   . Right inguinal hernia   . Weak urinary stream     History reviewed. No pertinent surgical history.  History reviewed. No pertinent family history. Social History:  reports that he has been smoking cigarettes.  He has a 7.00 pack-year smoking history. he has never used smokeless tobacco. He reports that he drinks alcohol. He reports that he uses drugs. Drug: Marijuana.  Allergies: No Known Allergies  Medications Prior to Admission  Medication Sig Dispense Refill  . aspirin 325 MG EC tablet Take 325 mg every 6 (six) hours as needed by mouth for pain.    Marland Kitchen loratadine (ALLERGY) 10 MG tablet Take 10 mg daily as needed by mouth for allergies.    . Multiple Vitamins-Minerals (CENTRUM SILVER ADULT 50+ PO) Take 1 tablet by mouth daily.    . traMADol (ULTRAM) 50 MG tablet Take 50 mg by mouth every 6 (six) hours as needed.      Results for orders placed or performed during the hospital encounter of 04/23/17 (from the past 48 hour(s))  Basic metabolic panel     Status: Abnormal   Collection Time: 04/21/17  9:57 AM  Result Value Ref Range   Sodium 140 135 - 145 mmol/L   Potassium 4.0 3.5 - 5.1 mmol/L   Chloride 106 101 - 111 mmol/L   CO2 28 22 - 32 mmol/L   Glucose, Bld 101 (H) 65 - 99 mg/dL   BUN 17 6 - 20 mg/dL   Creatinine, Ser 1.22 0.61 - 1.24 mg/dL   Calcium 9.6 8.9 - 10.3 mg/dL   GFR calc non Af Amer 60 (L) >60 mL/min   GFR calc Af Amer >60 >60 mL/min    Comment: (NOTE) The eGFR has been calculated using the CKD EPI  equation. This calculation has not been validated in all clinical situations. eGFR's persistently <60 mL/min signify possible Chronic Kidney Disease.    Anion gap 6 5 - 15  CBC WITH DIFFERENTIAL     Status: None   Collection Time: 04/21/17  9:57 AM  Result Value Ref Range   WBC 5.5 4.0 - 10.5 K/uL   RBC 4.75 4.22 - 5.81 MIL/uL   Hemoglobin 15.0 13.0 - 17.0 g/dL   HCT 44.5 39.0 - 52.0 %   MCV 93.7 78.0 - 100.0 fL   MCH 31.6 26.0 - 34.0 pg   MCHC 33.7 30.0 - 36.0 g/dL   RDW 14.4 11.5 - 15.5 %   Platelets 249 150 - 400 K/uL   Neutrophils Relative % 42 %   Neutro Abs 2.3 1.7 - 7.7 K/uL   Lymphocytes Relative 44 %   Lymphs Abs 2.4 0.7 - 4.0 K/uL   Monocytes Relative 10 %   Monocytes Absolute 0.5 0.1 - 1.0 K/uL   Eosinophils Relative 4 %   Eosinophils Absolute 0.2 0.0 - 0.7 K/uL   Basophils Relative 0 %   Basophils Absolute 0.0 0.0 - 0.1  K/uL   No results found.  Review of Systems  Constitutional: Negative for chills and fever.  HENT: Negative for hearing loss.   Eyes: Negative for blurred vision and double vision.  Respiratory: Negative for cough and hemoptysis.   Cardiovascular: Negative for chest pain and palpitations.  Gastrointestinal: Negative for abdominal pain, nausea and vomiting.  Genitourinary: Negative for dysuria and urgency.  Musculoskeletal: Negative for myalgias and neck pain.  Skin: Negative for itching and rash.  Neurological: Negative for dizziness, tingling and headaches.  Endo/Heme/Allergies: Does not bruise/bleed easily.  Psychiatric/Behavioral: Negative for depression and suicidal ideas.    Blood pressure (!) 142/93, pulse 84, temperature 98.4 F (36.9 C), temperature source Oral, resp. rate 16, height 5' 11" (1.803 m), weight 66.2 kg (146 lb), SpO2 99 %. Physical Exam  Vitals reviewed. Constitutional: He is oriented to person, place, and time. He appears well-developed and well-nourished.  HENT:  Head: Normocephalic and atraumatic.  Eyes:  Conjunctivae and EOM are normal. Pupils are equal, round, and reactive to light.  Neck: Normal range of motion. Neck supple.  Cardiovascular: Normal rate and regular rhythm.  Respiratory: Effort normal and breath sounds normal.  GI: Soft. Bowel sounds are normal. He exhibits no distension. There is no tenderness.  Right inguinal hernia  Musculoskeletal: Normal range of motion.  Neurological: He is alert and oriented to person, place, and time.  Skin: Skin is warm and dry.  Psychiatric: He has a normal mood and affect. His behavior is normal.     Assessment/Plan 66 yo male with right inguinal hernia -open right inguinal hernia repair with mesh -planned outpatient procedure -He continues to smoke a small amount and he is aware this puts him at a higher risk for recurrence and other complications  Mickeal Skinner, MD 04/23/2017, 8:54 AM

## 2017-04-23 NOTE — Anesthesia Procedure Notes (Signed)
Procedure Name: LMA Insertion Date/Time: 04/23/2017 10:46 AM Performed by: Bonney Aid, CRNA Pre-anesthesia Checklist: Patient identified, Emergency Drugs available, Suction available and Patient being monitored Patient Re-evaluated:Patient Re-evaluated prior to induction Oxygen Delivery Method: Circle system utilized Preoxygenation: Pre-oxygenation with 100% oxygen Induction Type: IV induction Ventilation: Mask ventilation without difficulty LMA: LMA inserted LMA Size: 4.0 Number of attempts: 1 Airway Equipment and Method: Bite block Placement Confirmation: positive ETCO2 Tube secured with: Tape Dental Injury: Teeth and Oropharynx as per pre-operative assessment

## 2017-04-23 NOTE — Addendum Note (Signed)
Addendum  created 04/23/17 1251 by Annye Asa, MD   Order list changed

## 2017-04-23 NOTE — Anesthesia Procedure Notes (Signed)
Anesthesia Regional Block: TAP block   Pre-Anesthetic Checklist: ,, timeout performed, Correct Patient, Correct Site, Correct Laterality, Correct Procedure, Correct Position, site marked, Risks and benefits discussed,  Surgical consent,  Pre-op evaluation,  At surgeon's request and post-op pain management  Laterality: Right and Lower  Prep: chloraprep       Needles:  Injection technique: Single-shot  Needle Type: Echogenic Needle     Needle Length: 9cm  Needle Gauge: 21     Additional Needles:   Procedures:,,,, ultrasound used (permanent image in chart),,,,  Narrative:  Start time: 04/23/2017 10:10 AM End time: 04/23/2017 10:18 AM Injection made incrementally with aspirations every 5 mL.  Performed by: Personally  Anesthesiologist: Annye Asa, MD  Additional Notes: Pt identified in Holding room.  Monitors applied. Working IV access confirmed. Sterile prep R flank.  #21ga ECHOgenic needle into TAP with US guidance.  30cc 0.5% Bupivacaine with 1:200k epi injected incrementally after negative test dose.  Patient asymptomatic, VSS, no heme aspirated, tolerated well.  Raechel Ache, MD assisted.  Jenita Seashore, MD

## 2017-04-23 NOTE — Anesthesia Preprocedure Evaluation (Addendum)
Anesthesia Evaluation  Patient identified by MRN, date of birth, ID band Patient awake    Reviewed: Allergy & Precautions, NPO status , Patient's Chart, lab work & pertinent test results  History of Anesthesia Complications Negative for: history of anesthetic complications  Airway Mallampati: II  TM Distance: >3 FB Neck ROM: Full    Dental  (+) Edentulous Upper, Edentulous Lower   Pulmonary COPD, Current Smoker,    breath sounds clear to auscultation       Cardiovascular negative cardio ROS   Rhythm:Regular Rate:Normal     Neuro/Psych negative neurological ROS  negative psych ROS   GI/Hepatic GERD  Controlled,(+)     substance abuse  alcohol use and marijuana use,   Endo/Other  negative endocrine ROS  Renal/GU negative Renal ROS     Musculoskeletal   Abdominal   Peds  Hematology negative hematology ROS (+)   Anesthesia Other Findings   Reproductive/Obstetrics                            Anesthesia Physical Anesthesia Plan  ASA: II  Anesthesia Plan: General   Post-op Pain Management: GA combined w/ Regional for post-op pain   Induction: Intravenous  PONV Risk Score and Plan: 1 and Ondansetron and Dexamethasone  Airway Management Planned: LMA  Additional Equipment:   Intra-op Plan:   Post-operative Plan:   Informed Consent: I have reviewed the patients History and Physical, chart, labs and discussed the procedure including the risks, benefits and alternatives for the proposed anesthesia with the patient or authorized representative who has indicated his/her understanding and acceptance.   Dental advisory given  Plan Discussed with: CRNA and Surgeon  Anesthesia Plan Comments: (Plan routine monitors, GA- LMA OK, TAP block for post op analgesia)        Anesthesia Quick Evaluation

## 2017-04-23 NOTE — Discharge Instructions (Signed)

## 2017-04-23 NOTE — Progress Notes (Signed)
1017 monitors on with O2 at 2l/m time out for block with Dr. Jenita Seashore.  1018 Versed 2 mg and Fent. 100 mcg IV given for sedation. 1035 block comlpeted. Tolerated well.

## 2017-04-23 NOTE — Transfer of Care (Signed)
Immediate Anesthesia Transfer of Care Note  Patient: Jason Jones  Procedure(s) Performed: OPEN RIGHT INGUINAL HERNIA REPAIR WITH MESH (Right Inguinal) INSERTION OF MESH (Right Inguinal)  Patient Location: PACU  Anesthesia Type:General  Level of Consciousness: sedated and responds to stimulation  Airway & Oxygen Therapy: Patient Spontanous Breathing and Patient connected to nasal cannula oxygen  Post-op Assessment: Report given to RN  Post vital signs: Reviewed and stable  Last Vitals: 98% Vitals:   04/23/17 1027 04/23/17 1032  BP: 133/86 (!) 132/96  Pulse: (!) 52 (!) 53  Resp: 13 12  Temp:    SpO2:      Last Pain:  Vitals:   04/23/17 0834  TempSrc: Oral      Patients Stated Pain Goal: 2 (45/99/77 4142)  Complications: No apparent anesthesia complications

## 2017-04-23 NOTE — Anesthesia Postprocedure Evaluation (Signed)
Anesthesia Post Note  Patient: Wayman Hoard  Procedure(s) Performed: OPEN RIGHT INGUINAL HERNIA REPAIR WITH MESH (Right Inguinal) INSERTION OF MESH (Right Inguinal)     Patient location during evaluation: PACU Anesthesia Type: General and Regional Level of consciousness: awake and alert, patient cooperative and oriented Pain management: pain level controlled (pt eating, states is comfortable) Vital Signs Assessment: post-procedure vital signs reviewed and stable Respiratory status: spontaneous breathing, nonlabored ventilation, respiratory function stable and patient connected to nasal cannula oxygen Cardiovascular status: blood pressure returned to baseline and stable Postop Assessment: no apparent nausea or vomiting Anesthetic complications: no    Last Vitals:  Vitals:   04/23/17 1215 04/23/17 1230  BP: (!) 153/115 (!) 154/110  Pulse: 84 87  Resp: 19 16  Temp:    SpO2: 97% 98%    Last Pain:  Vitals:   04/23/17 1215  TempSrc:   PainSc: 8                  Yumiko Alkins,E. Loni Abdon

## 2017-04-24 ENCOUNTER — Encounter (HOSPITAL_BASED_OUTPATIENT_CLINIC_OR_DEPARTMENT_OTHER): Payer: Self-pay | Admitting: General Surgery

## 2020-04-20 ENCOUNTER — Other Ambulatory Visit: Payer: Self-pay | Admitting: Registered Nurse

## 2020-04-24 ENCOUNTER — Other Ambulatory Visit: Payer: Self-pay | Admitting: Registered Nurse

## 2020-04-24 DIAGNOSIS — R0989 Other specified symptoms and signs involving the circulatory and respiratory systems: Secondary | ICD-10-CM

## 2020-04-24 DIAGNOSIS — B351 Tinea unguium: Secondary | ICD-10-CM

## 2020-05-01 ENCOUNTER — Ambulatory Visit
Admission: RE | Admit: 2020-05-01 | Discharge: 2020-05-01 | Disposition: A | Discharge status: Home / Self Care | Payer: Medicare Other | Source: Ambulatory Visit | Attending: Registered Nurse | Admitting: Registered Nurse

## 2020-05-01 DIAGNOSIS — B351 Tinea unguium: Secondary | ICD-10-CM

## 2020-05-01 DIAGNOSIS — R0989 Other specified symptoms and signs involving the circulatory and respiratory systems: Secondary | ICD-10-CM

## 2020-12-07 ENCOUNTER — Other Ambulatory Visit: Payer: Self-pay | Admitting: Urology

## 2020-12-07 DIAGNOSIS — R972 Elevated prostate specific antigen [PSA]: Secondary | ICD-10-CM

## 2020-12-21 ENCOUNTER — Other Ambulatory Visit: Payer: Self-pay

## 2020-12-21 ENCOUNTER — Ambulatory Visit
Admission: RE | Admit: 2020-12-21 | Discharge: 2020-12-21 | Disposition: A | Discharge status: Home / Self Care | Payer: Medicare Other | Source: Ambulatory Visit | Attending: Urology | Admitting: Urology

## 2020-12-21 DIAGNOSIS — R972 Elevated prostate specific antigen [PSA]: Secondary | ICD-10-CM

## 2020-12-21 MED ORDER — GADOBENATE DIMEGLUMINE 529 MG/ML IV SOLN
14.0000 mL | Freq: Once | INTRAVENOUS | Status: AC | PRN
  Administered 2020-12-21: 14 mL via INTRAVENOUS

## 2021-01-08 HISTORY — PX: COLONOSCOPY: SHX174

## 2021-02-19 NOTE — Progress Notes (Signed)
GU Location of Tumor / Histology: Prostate Ca  If Prostate Cancer, Gleason Score is (4 + 3) and PSA is (13.30)   Biopsies    Past/Anticipated interventions by urology, if any:   Dr. Gilford Rile:   Consultation with Dr. Louis Meckel to discuss RALP.  Consultation with Dr. Tammi Klippel to discuss radiation treatment options.  F/U 03/13/2021.  Past/Anticipated interventions by medical oncology, if any:   IPSS:  7 SHIM: 6  Weight changes, if any: Patient states possibly loss/gain 20lbs over 90 days.  Bowel/Bladder complaints, if any: Constipation mostly is taking Colace.  Patient is taking Tamsulosin Bid and he has gross hematuria.   Nausea/Vomiting, if any: No  Pain issues, if any:  0  SAFETY ISSUES: Prior radiation? No Pacemaker/ICD? No Possible current pregnancy? No Is the patient on methotrexate? No  Current Complaints / other details:

## 2021-02-20 ENCOUNTER — Encounter: Payer: Self-pay | Admitting: Radiation Oncology

## 2021-02-20 ENCOUNTER — Ambulatory Visit
Admission: RE | Admit: 2021-02-20 | Discharge: 2021-02-20 | Disposition: A | Discharge status: Home / Self Care | Payer: Medicare Other | Source: Ambulatory Visit | Attending: Radiation Oncology | Admitting: Radiation Oncology

## 2021-02-20 ENCOUNTER — Other Ambulatory Visit: Payer: Self-pay

## 2021-02-20 VITALS — BP 124/72 | HR 99 | Temp 97.4°F | Resp 20 | Ht 71.0 in | Wt 150.1 lb

## 2021-02-20 DIAGNOSIS — C61 Malignant neoplasm of prostate: Secondary | ICD-10-CM

## 2021-02-20 NOTE — Progress Notes (Signed)
Radiation Oncology         [phone] ________________________________  Initial Outpatient Consultation  Name: Jason Jones MRN: [id number]  Date: 02/20/2021  DOB: 28-Jan-1951  [id number], Maudie Mercury, NP  Davis Gourd*   REFERRING PHYSICIAN: Davis Gourd*  DIAGNOSIS: 70 y.o. gentleman with Stage T1c adenocarcinoma of the prostate with Gleason score of 4+3, and PSA of 13.3.    ICD-10-CM   1. Malignant neoplasm of prostate ([place])  C61       HISTORY OF PRESENT ILLNESS: Jason Jones is a 70 y.o. male with a diagnosis of prostate cancer. He has been followed by Dr. Lovena Neighbours since at least 2019 for an elevated PSA in the 9 range. He previously underwent prostate biopsy on 05/18/18, which was entirely benign. PSA at the time of biopsy was 9.46. Since that time, his PSA reached 13.4 in 05/2020 where it has remained stable-elevated, most recently 13.3 in 11/2020. He underwent prostate MRI on 12/21/20 showing  two small PI-RADS 4 lesions bilaterally in the peripheral zone as well as a PI-RADS 3 lesion in the right peripheral zone.  The prostate volume was estimated at 99.55 cc. The patient proceeded to MRI fusion biopsy of the prostate on 01/23/21.  The prostate volume measured 81 cc by ultrasound.  Out of 21 core biopsies, 3 were positive.  The maximum Gleason score was 4+3, and this was seen in the right mid. Additionally, Gleason 3+4 was seen in the left base lateral, and Gleason 3+3 in the left apex lateral. All 9/9 ROI MRI lesion samples were benign.  The patient reviewed the biopsy results with his urologist and he has kindly been referred today for discussion of potential radiation treatment options.   PREVIOUS RADIATION THERAPY: No  PAST MEDICAL HISTORY:  Past Medical History:  Diagnosis Date   Arthritis    Chronic alcohol abuse    GERD (gastroesophageal reflux disease)    PRN  ---  TAKES BAKING SODA IN WATER   History of pneumothorax    04-28-2006  fell, left fx rib--   resolved w/ chest tube   Nocturia    Poor dental hygiene    Right inguinal hernia    Weak urinary stream       PAST SURGICAL HISTORY: Past Surgical History:  Procedure Laterality Date   COLONOSCOPY  01/08/2021   INGUINAL HERNIA REPAIR Right 04/23/2017   Procedure: OPEN RIGHT INGUINAL HERNIA REPAIR WITH MESH;  Surgeon: Kinsinger, Arta Bruce, MD;  Location: [place];  Service: General;  Laterality: Right;  GENERAL COMBINED WITH REGIONAL FOR POST OP PAIN    INSERTION OF MESH Right 04/23/2017   Procedure: INSERTION OF MESH;  Surgeon: Kinsinger, Arta Bruce, MD;  Location: [place];  Service: General;  Laterality: Right;  GENERAL COMBINED WITH REGIONAL FOR POST OP PAIN    LIPOMA EXCISION Right 02/22/2015   Procedure: RIGHT SHOULDER MASS EXCISION 15 CM SQ;  Surgeon: Arta Bruce Kinsinger, MD;  Location: [place];  Service: General;  Laterality: Right;    FAMILY HISTORY:  Family History  Problem Relation Age of Onset   Prostate cancer Cousin    Prostate cancer Cousin     SOCIAL HISTORY:  Social History   Socioeconomic History   Marital status: Divorced    Spouse name: Not on file   Number of children: Not on file   Years of education: Not on file   Highest education level: Not on file  Occupational History  Not on file  Tobacco Use   Smoking status: Every Day    Packs/day: 1.00    Years: 7.00    Pack years: 7.00    Types: Cigarettes   Smokeless tobacco: Never  Substance and Sexual Activity   Alcohol use: Yes    Comment: 40 beer and liquor per day or every other day   Drug use: Not Currently    Types: Marijuana    Comment: occasional use (per pt once every 4-5 months )   Sexual activity: Not on file  Other Topics Concern   Not on file  Social History Narrative   Not on file   Social Determinants of Health   Financial Resource Strain: Not on file  Food Insecurity: Not on file  Transportation Needs: Not on file   Physical Activity: Not on file  Stress: Not on file  Social Connections: Not on file  Intimate Partner Violence: Not At Risk   Fear of Current or Ex-Partner: No   Emotionally Abused: No   Physically Abused: No   Sexually Abused: No    ALLERGIES: Seasonal ic [cholestatin]  MEDICATIONS:  Current Outpatient Medications  Medication Sig Dispense Refill   amLODipine (NORVASC) 2.5 MG tablet Take 2.5 mg by mouth daily.     aspirin 325 MG EC tablet Take 325 mg every 6 (six) hours as needed by mouth for pain.     fluticasone (FLONASE) 50 MCG/ACT nasal spray SMARTSIG:1-2 Spray(s) Both Nares Daily PRN     HYDROcodone-acetaminophen (NORCO/VICODIN) 5-325 MG tablet Take 1-2 tablets every 6 (six) hours as needed by mouth for moderate pain. 30 tablet 0   ibuprofen (ADVIL,MOTRIN) 800 MG tablet Take 1 tablet (800 mg total) every 8 (eight) hours as needed by mouth. 30 tablet 0   loratadine (CLARITIN) 10 MG tablet Take 10 mg daily as needed by mouth for allergies.     meloxicam (MOBIC) 15 MG tablet Take 15 mg by mouth daily.     Multiple Vitamins-Minerals (CENTRUM SILVER ADULT 50+ PO) Take 1 tablet by mouth daily.     tamsulosin (FLOMAX) 0.4 MG CAPS capsule Take 0.4 mg by mouth every 12 (twelve) hours.     traMADol (ULTRAM) 50 MG tablet Take 50 mg by mouth every 6 (six) hours as needed.     No current facility-administered medications for this encounter.    REVIEW OF SYSTEMS:  On review of systems, the patient reports that he is doing well overall. He denies any chest pain, shortness of breath, cough, fevers, chills, night sweats, unintended weight changes. He denies abdominal pain, nausea or vomiting. He denies any new musculoskeletal or joint aches or pains. His IPSS was 7, indicating mild urinary symptoms with nocturia x3 despite taking Flomax BID as prescribed. He reports some residual hematuria from biopsy, now just an intermittent, scant pink tinge in the urine.  He also reports constipation, for  which he is using Colace prn. His SHIM was 6, indicating he has severe erectile dysfunction. A complete review of systems is obtained and is otherwise negative.    PHYSICAL EXAM:  Wt Readings from Last 3 Encounters:  02/20/21 150 lb 2 oz (68.1 kg)  04/23/17 146 lb (66.2 kg)  02/22/15 148 lb 8 oz (67.4 kg)   Temp Readings from Last 3 Encounters:  02/20/21 (!) 97.4 F (36.3 C) (Temporal)  04/23/17 97.7 F (36.5 C)  02/22/15 97.9 F (36.6 C) (Oral)   BP Readings from Last 3 Encounters:  02/20/21 124/72  04/23/17  117/85  02/22/15 (!) 144/81   Pulse Readings from Last 3 Encounters:  02/20/21 99  04/23/17 (!) 45  02/22/15 90   Pain Assessment Pain Score: 0-No pain/10  In general this is a well appearing African-American male in no acute distress. He's alert and oriented x4 and appropriate throughout the examination. Cardiopulmonary assessment is negative for acute distress, and he exhibits normal effort.     KPS = 100  100 - Normal; no complaints; no evidence of disease. 90   - Able to carry on normal activity; minor signs or symptoms of disease. 80   - Normal activity with effort; some signs or symptoms of disease. 75   - Cares for self; unable to carry on normal activity or to do active work. 60   - Requires occasional assistance, but is able to care for most of his personal needs. 50   - Requires considerable assistance and frequent medical care. 51   - Disabled; requires special care and assistance. 70   - Severely disabled; hospital admission is indicated although death not imminent. 60   - Very sick; hospital admission necessary; active supportive treatment necessary. 10   - Moribund; fatal processes progressing rapidly. 0     - Dead  Karnofsky DA, Abelmann [place], Craver LS and Burchenal [facility] [phone]) The use of the nitrogen mustards in the palliative treatment of carcinoma: with particular reference to bronchogenic carcinoma Cancer 1 634-56  LABORATORY DATA:  Lab Results   Component Value Date   WBC 5.5 04/21/2017   HGB 15.0 04/21/2017   HCT 44.5 04/21/2017   MCV 93.7 04/21/2017   PLT 249 04/21/2017   Lab Results  Component Value Date   NA 140 04/21/2017   K 4.0 04/21/2017   CL 106 04/21/2017   CO2 28 04/21/2017   No results found for: ALT, AST, GGT, ALKPHOS, BILITOT   RADIOGRAPHY: No results found.    IMPRESSION/PLAN: 1. 70 y.o. gentleman with Stage T1c adenocarcinoma of the prostate with Gleason Score of 4+3, and PSA of 13.3. We discussed the patient's workup and outlined the nature of prostate cancer in this setting. The patient's T stage, Gleason's score, and PSA put him into the unfavorable intermediate risk group. Accordingly, he is eligible for a variety of potential treatment options including brachytherapy, 5.5 weeks of external radiation, or prostatectomy. We discussed the available radiation techniques, and focused on the details and logistics of delivery. The patient is not felt to be an ideal candidate for brachytherapy with a prostate volume of 80-90 cc and LUTS despite maximal medical therapy. We discussed and outlined the risks, benefits, short and long-term effects associated with radiotherapy and compared and contrasted these with prostatectomy. We discussed the role of SpaceOAR gel in reducing the rectal toxicity associated with radiotherapy.  He appears to have a good understanding of his disease and our treatment recommendations which are of curative intent.  He was encouraged to ask questions that were answered to his stated satisfaction.  At the conclusion of our conversation, the patient is leaning towards moving forward with 5.5 weeks of prostate IMRT but plans to keep his scheduled consult visit with Dr. Louis Meckel on 03/13/21 to discuss RALP prior to making his final decision. We will share our discussion with Dr. Lovena Neighbours and look forward to following along in his care. He has our contact information and will let us know if he ultimately  decides to proceed with radiotherapy and we can coordinate for fiducial markers and SpaceOAR gel  at that time. He did mention that he would prefer to have those placed in the outpatient surgical setting, under anesthesia, if he decides to move forward with radiation.   We personally spent 70 minutes in this encounter including chart review, reviewing radiological studies, meeting face-to-face with the patient, entering orders and completing documentation.    Nicholos Johns, PA-C    Tyler Pita, MD  [facility] Direct Dial: [phone]  Fax: [phone] [email or web address]  Skype  LinkedIn   This document serves as a record of services personally performed by Tyler Pita, MD and Freeman Caldron, PA-C. It was created on their behalf by Wilburn Mylar, a trained medical scribe. The creation of this record is based on the scribe's personal observations and the provider's statements to them. This document has been checked and approved by the attending provider.

## 2021-03-19 ENCOUNTER — Telehealth: Payer: Self-pay | Admitting: *Deleted

## 2021-03-19 NOTE — Telephone Encounter (Signed)
RETURNED PATIENT'S PHONE CALL, SPOKE WITH PATIENT. ?

## 2021-03-29 ENCOUNTER — Other Ambulatory Visit: Payer: Self-pay | Admitting: Urology

## 2021-03-29 DIAGNOSIS — C61 Malignant neoplasm of prostate: Secondary | ICD-10-CM

## 2021-03-30 ENCOUNTER — Other Ambulatory Visit (HOSPITAL_COMMUNITY): Payer: Self-pay | Admitting: Radiation Oncology

## 2021-03-30 ENCOUNTER — Other Ambulatory Visit: Payer: Self-pay | Admitting: Radiation Oncology

## 2021-03-30 DIAGNOSIS — C61 Malignant neoplasm of prostate: Secondary | ICD-10-CM

## 2021-04-06 ENCOUNTER — Ambulatory Visit
Admission: RE | Admit: 2021-04-06 | Discharge: 2021-04-06 | Disposition: A | Discharge status: Home / Self Care | Payer: Medicare Other | Source: Ambulatory Visit | Attending: Radiation Oncology | Admitting: Radiation Oncology

## 2021-04-06 ENCOUNTER — Other Ambulatory Visit: Payer: Self-pay

## 2021-04-06 ENCOUNTER — Telehealth: Payer: Self-pay | Admitting: *Deleted

## 2021-04-06 ENCOUNTER — Ambulatory Visit
Admission: RE | Admit: 2021-04-06 | Discharge: 2021-04-06 | Disposition: A | Discharge status: Home / Self Care | Payer: Medicare Other | Source: Ambulatory Visit | Attending: Urology | Admitting: Urology

## 2021-04-06 ENCOUNTER — Encounter: Payer: Self-pay | Admitting: Urology

## 2021-04-06 VITALS — BP 143/89 | HR 100 | Temp 96.4°F | Resp 20 | Ht 71.0 in | Wt 150.5 lb

## 2021-04-06 DIAGNOSIS — Z8042 Family history of malignant neoplasm of prostate: Secondary | ICD-10-CM | POA: Diagnosis not present

## 2021-04-06 DIAGNOSIS — K219 Gastro-esophageal reflux disease without esophagitis: Secondary | ICD-10-CM | POA: Insufficient documentation

## 2021-04-06 DIAGNOSIS — C61 Malignant neoplasm of prostate: Secondary | ICD-10-CM | POA: Insufficient documentation

## 2021-04-06 DIAGNOSIS — F17210 Nicotine dependence, cigarettes, uncomplicated: Secondary | ICD-10-CM | POA: Insufficient documentation

## 2021-04-06 DIAGNOSIS — F1010 Alcohol abuse, uncomplicated: Secondary | ICD-10-CM | POA: Diagnosis not present

## 2021-04-06 DIAGNOSIS — R3912 Poor urinary stream: Secondary | ICD-10-CM | POA: Insufficient documentation

## 2021-04-06 DIAGNOSIS — K449 Diaphragmatic hernia without obstruction or gangrene: Secondary | ICD-10-CM | POA: Diagnosis not present

## 2021-04-06 DIAGNOSIS — M129 Arthropathy, unspecified: Secondary | ICD-10-CM | POA: Insufficient documentation

## 2021-04-06 NOTE — Progress Notes (Signed)
Radiation Oncology         [phone] ________________________________  Outpatient Re-Evaluation  Name: Jason Jones MRN: [id number]  Date: 04/06/2021  DOB: 10-Oct-1950  [id number], Maudie Mercury, NP  Ardis Hughs, MD   REFERRING PHYSICIAN: Ardis Hughs, MD  DIAGNOSIS: 70 y.o. gentleman with Stage T1c adenocarcinoma of the prostate with Gleason score of 4+3, and PSA of 13.3.    ICD-10-CM   1. Malignant neoplasm of prostate ([place])  C61       HISTORY OF PRESENT ILLNESS: Jason Jones is a 70 y.o. male with a diagnosis of prostate cancer. He has been followed by Dr. Lovena Neighbours since at least 2019 for an elevated PSA in the 9 range. He previously underwent prostate biopsy on 05/18/18, which was entirely benign. PSA at the time of biopsy was 9.46. Since that time, his PSA reached 13.4 in 05/2020 where it has remained stable-elevated, most recently 13.3 in 11/2020. He underwent prostate MRI on 12/21/20 showing  two small PI-RADS 4 lesions bilaterally in the peripheral zone as well as a PI-RADS 3 lesion in the right peripheral zone.  The prostate volume was estimated at 99.55 cc. The patient proceeded to MRI fusion biopsy of the prostate on 01/23/21.  The prostate volume measured 81 cc by ultrasound.  Out of 21 core biopsies, 3 were positive.  The maximum Gleason score was 4+3, and this was seen in the right mid. Additionally, Gleason 3+4 was seen in the left base lateral, and Gleason 3+3 in the left apex lateral. All 9/9 ROI MRI lesion samples were benign.  We met with the patient on 02/20/21. At that time, he was undecided but leaning towards proceeding with external beam radiotherapy. Since that time, he met with Dr. Louis Meckel to discuss RALP and following their discussion, he is most interested in radiation treatment.  The patient has kindly been referred back today to review radiation treatment options.  PREVIOUS RADIATION THERAPY: No  PAST MEDICAL HISTORY:  Past Medical History:  Diagnosis  Date   Arthritis    Chronic alcohol abuse    GERD (gastroesophageal reflux disease)    PRN  ---  TAKES BAKING SODA IN WATER   History of pneumothorax    04-28-2006  fell, left fx rib--  resolved w/ chest tube   Nocturia    Poor dental hygiene    Right inguinal hernia    Weak urinary stream       PAST SURGICAL HISTORY: Past Surgical History:  Procedure Laterality Date   COLONOSCOPY  01/08/2021   INGUINAL HERNIA REPAIR Right 04/23/2017   Procedure: OPEN RIGHT INGUINAL HERNIA REPAIR WITH MESH;  Surgeon: Kinsinger, Arta Bruce, MD;  Location: [place];  Service: General;  Laterality: Right;  GENERAL COMBINED WITH REGIONAL FOR POST OP PAIN    INSERTION OF MESH Right 04/23/2017   Procedure: INSERTION OF MESH;  Surgeon: Kinsinger, Arta Bruce, MD;  Location: [place];  Service: General;  Laterality: Right;  GENERAL COMBINED WITH REGIONAL FOR POST OP PAIN    LIPOMA EXCISION Right 02/22/2015   Procedure: RIGHT SHOULDER MASS EXCISION 15 CM SQ;  Surgeon: Arta Bruce Kinsinger, MD;  Location: [place];  Service: General;  Laterality: Right;    FAMILY HISTORY:  Family History  Problem Relation Age of Onset   Prostate cancer Cousin    Prostate cancer Cousin     SOCIAL HISTORY:  Social History   Socioeconomic History   Marital status: Divorced  Spouse name: Not on file   Number of children: Not on file   Years of education: Not on file   Highest education level: Not on file  Occupational History   Not on file  Tobacco Use   Smoking status: Every Day    Packs/day: 1.00    Years: 7.00    Pack years: 7.00    Types: Cigarettes   Smokeless tobacco: Never  Substance and Sexual Activity   Alcohol use: Yes    Comment: 40 beer and liquor per day or every other day   Drug use: Not Currently    Types: Marijuana    Comment: occasional use (per pt once every 4-5 months )   Sexual activity: Not on file  Other Topics Concern   Not on  file  Social History Narrative   Not on file   Social Determinants of Health   Financial Resource Strain: Not on file  Food Insecurity: Not on file  Transportation Needs: Not on file  Physical Activity: Not on file  Stress: Not on file  Social Connections: Not on file  Intimate Partner Violence: Not At Risk   Fear of Current or Ex-Partner: No   Emotionally Abused: No   Physically Abused: No   Sexually Abused: No    ALLERGIES: Seasonal ic [cholestatin]  MEDICATIONS:  Current Outpatient Medications  Medication Sig Dispense Refill   amLODipine (NORVASC) 2.5 MG tablet Take 2.5 mg by mouth daily.     aspirin 325 MG EC tablet Take 325 mg every 6 (six) hours as needed by mouth for pain.     fluticasone (FLONASE) 50 MCG/ACT nasal spray SMARTSIG:1-2 Spray(s) Both Nares Daily PRN     HYDROcodone-acetaminophen (NORCO/VICODIN) 5-325 MG tablet Take 1-2 tablets every 6 (six) hours as needed by mouth for moderate pain. 30 tablet 0   ibuprofen (ADVIL,MOTRIN) 800 MG tablet Take 1 tablet (800 mg total) every 8 (eight) hours as needed by mouth. 30 tablet 0   loratadine (CLARITIN) 10 MG tablet Take 10 mg daily as needed by mouth for allergies.     meloxicam (MOBIC) 15 MG tablet Take 15 mg by mouth daily.     Multiple Vitamins-Minerals (CENTRUM SILVER ADULT 50+ PO) Take 1 tablet by mouth daily.     tamsulosin (FLOMAX) 0.4 MG CAPS capsule Take 0.4 mg by mouth every 12 (twelve) hours.     traMADol (ULTRAM) 50 MG tablet Take 50 mg by mouth every 6 (six) hours as needed.     No current facility-administered medications for this encounter.    REVIEW OF SYSTEMS:  On review of systems, the patient reports that he is doing well overall. He denies any chest pain, shortness of breath, cough, fevers, chills, night sweats, unintended weight changes. He denies abdominal pain, nausea or vomiting. He denies any new musculoskeletal or joint aches or pains. His IPSS was 2 today, indicating mild urinary symptoms. He  is taking Flomax BID as prescribed. He reports upper abdominal pain, rated 4/10 today. A complete review of systems is obtained and is otherwise negative.    PHYSICAL EXAM:  Wt Readings from Last 3 Encounters:  04/06/21 150 lb 8 oz (68.3 kg)  02/20/21 150 lb 2 oz (68.1 kg)  04/23/17 146 lb (66.2 kg)   Temp Readings from Last 3 Encounters:  04/06/21 (!) 96.4 F (35.8 C) (Temporal)  02/20/21 (!) 97.4 F (36.3 C) (Temporal)  04/23/17 97.7 F (36.5 C)   BP Readings from Last 3 Encounters:  04/06/21 Marland Kitchen)  143/89  02/20/21 124/72  04/23/17 117/85   Pulse Readings from Last 3 Encounters:  04/06/21 100  02/20/21 99  04/23/17 (!) 45   Pain Assessment Pain Score: 0-No pain/10  In general this is a well appearing African-American male in no acute distress. He's alert and oriented x4 and appropriate throughout the examination. Cardiopulmonary assessment is negative for acute distress, and he exhibits normal effort.     KPS = 100  100 - Normal; no complaints; no evidence of disease. 90   - Able to carry on normal activity; minor signs or symptoms of disease. 80   - Normal activity with effort; some signs or symptoms of disease. 29   - Cares for self; unable to carry on normal activity or to do active work. 60   - Requires occasional assistance, but is able to care for most of his personal needs. 50   - Requires considerable assistance and frequent medical care. 47   - Disabled; requires special care and assistance. 70   - Severely disabled; hospital admission is indicated although death not imminent. 49   - Very sick; hospital admission necessary; active supportive treatment necessary. 10   - Moribund; fatal processes progressing rapidly. 0     - Dead  Karnofsky DA, Abelmann [place], Craver LS and Burchenal [facility] [phone]) The use of the nitrogen mustards in the palliative treatment of carcinoma: with particular reference to bronchogenic carcinoma Cancer 1 634-56  LABORATORY DATA:  Lab  Results  Component Value Date   WBC 5.5 04/21/2017   HGB 15.0 04/21/2017   HCT 44.5 04/21/2017   MCV 93.7 04/21/2017   PLT 249 04/21/2017   Lab Results  Component Value Date   NA 140 04/21/2017   K 4.0 04/21/2017   CL 106 04/21/2017   CO2 28 04/21/2017   No results found for: ALT, AST, GGT, ALKPHOS, BILITOT   RADIOGRAPHY: No results found.    IMPRESSION/PLAN: 1. 70 y.o. gentleman with Stage T1c adenocarcinoma of the prostate with Gleason Score of 4+3, and PSA of 13.3. We discussed the patient's workup and outlined the nature of prostate cancer in this setting. The patient's T stage, Gleason's score, and PSA put him into the unfavorable intermediate risk group. Accordingly, he is eligible for a variety of potential treatment options including brachytherapy, 5.5 weeks of external radiation, or prostatectomy. We again discussed the available radiation techniques, and focused on the details and logistics of delivery. The patient is not felt to be an ideal candidate for brachytherapy with a prostate volume of 100 cc and LUTS despite maximal medical therapy. We discussed and outlined the risks, benefits, short and long-term effects associated with radiotherapy and compared and contrasted these with prostatectomy. We discussed the role of SpaceOAR gel in reducing the rectal toxicity associated with radiotherapy. He was encouraged to ask questions that were answered to his stated satisfaction.  At the conclusion of our conversation, the patient is interested in proceeding with 5.5 weeks of external beam therapy.  We will share our discussion with Dr. Lovena Neighbours and proceed as planned with fiducial markers and SpaceOAR gel placement in the outpatient surgical setting, under anesthesia on 05/23/21, prior to simulation/treatment planning on 05/25/21, to reduce rectal toxicity from radiotherapy. The patient appears to have a good understanding of his disease and our treatment recommendations which are of  curative intent and is in agreement with the stated plan.  Therefore, we will move forward with treatment planning accordingly, in anticipation of beginning IMRT in  the near future.   We personally spent 45 minutes in this encounter including chart review, reviewing radiological studies, meeting face-to-face with the patient, entering orders and completing documentation.    Nicholos Johns, PA-C    Tyler Pita, MD  [facility] Direct Dial: [phone]  Fax: [phone] [email or web address]  Skype  LinkedIn   This document serves as a record of services personally performed by Tyler Pita, MD and Freeman Caldron, PA-C. It was created on their behalf by Wilburn Mylar, a trained medical scribe. The creation of this record is based on the scribe's personal observations and the provider's statements to them. This document has been checked and approved by the attending provider.

## 2021-04-06 NOTE — Progress Notes (Signed)
Patient reports RUQ and LUQ ABD pain 4/10. No other symptoms reported at this time.  I-PSS Score of 2 (mild).  Currently on Flomax 0.4mg  as directed. Urology follow-up w/ [facility] scheduled for 05/16/21 -per patient.  Meaningful use complete.  BP (!) 143/89 (BP Location: Left Arm, Patient Position: Sitting)   Pulse 100   Temp (!) 96.4 F (35.8 C) (Temporal)   Resp 20   Ht 5\' 11"  (1.803 m)   Wt 150 lb 8 oz (68.3 kg)   SpO2 97%   BMI 20.99 kg/m

## 2021-04-06 NOTE — Telephone Encounter (Signed)
CALLED PATIENT TO INFORM OF SIM APPT. FOR 05-25-21- ARRIVAL TIME- 8:15 AM @ [facility], SPOKE WITH PATIENT AND HE IS AWARE OF THIS APPT.

## 2021-05-01 NOTE — Progress Notes (Addendum)
Anesthesia Review:  PCP: [facility]  Cardiologist : none  Chest x-ray : EKG :05/07/21  Echo : Stress test: Cardiac Cath :  Activity level: can do a flight of stairs without difficulty  Sleep Study/ CPAP : none  Fasting Blood Sugar :      / Checks Blood Sugar -- times a day:   Blood Thinner/ Instructions /Last Dose: ASA / Instructions/ Last Dose :   No covid- ambulatory surgery  Positive smoker.  PT instructed to refrain from smoking for at least 24 hours prior to procedure.  PT voiced understanding.

## 2021-05-01 NOTE — Progress Notes (Addendum)
Jason Jones  05/01/2021   Your procedure is scheduled on:  05/23/2021   Report to [facility] Main  Entrance   Report to admitting at   1045 AM     Call this number if you have problems the morning of surgery [phone]    Remember: Do not eat food , candy gum or mints :After Midnight. You may have clear liquids from midnight until __  1000am   Fleets enema 2 hours prior to arrival am of surgery.   CLEAR LIQUID DIET   Foods Allowed                                                                       Coffee and tea, regular and decaf                              Plain Jell-O any favor except red or purple                                            Fruit ices (not with fruit pulp)                                      Iced Popsicles                                     Carbonated beverages, regular and diet                                    Cranberry, grape and apple juices Sports drinks like Gatorade Lightly seasoned clear broth or consume(fat free) Sugar   _____________________________________________________________________    BRUSH YOUR TEETH MORNING OF SURGERY AND RINSE YOUR MOUTH OUT, NO CHEWING GUM CANDY OR MINTS.     Take these medicines the morning of surgery with A SIP OF WATER:  amlodipine, flomax   DO NOT TAKE ANY DIABETIC MEDICATIONS DAY OF YOUR SURGERY                               You may not have any metal on your body including hair pins and              piercings  Do not wear jewelry, make-up, lotions, powders or perfumes, deodorant             Do not wear nail polish on your fingernails.  Do not shave  48 hours prior to surgery.              Men may shave face and neck.   Do not bring valuables to the hospital. [facility] IS NOT  RESPONSIBLE   FOR VALUABLES.  Contacts, dentures or bridgework may not be worn into surgery.  Leave suitcase in the car. After surgery it may be brought to your  room.     Patients discharged the day of surgery will not be allowed to drive home. IF YOU ARE HAVING SURGERY AND GOING HOME THE SAME DAY, YOU MUST HAVE AN ADULT TO DRIVE YOU HOME AND BE WITH YOU FOR 24 HOURS. YOU MAY GO HOME BY TAXI OR UBER OR ORTHERWISE, BUT AN ADULT MUST ACCOMPANY YOU HOME AND STAY WITH YOU FOR 24 HOURS.  Name and phone number of your driver:  Special Instructions: N/A              Please read over the following fact sheets you were given: _____________________________________________________________________  [facility] - Preparing for Surgery Before surgery, you can play an important role.  Because skin is not sterile, your skin needs to be as free of germs as possible.  You can reduce the number of germs on your skin by washing with CHG (chlorahexidine gluconate) soap before surgery.  CHG is an antiseptic cleaner which kills germs and bonds with the skin to continue killing germs even after washing. Please DO NOT use if you have an allergy to CHG or antibacterial soaps.  If your skin becomes reddened/irritated stop using the CHG and inform your nurse when you arrive at Short Stay. Do not shave (including legs and underarms) for at least 48 hours prior to the first CHG shower.  You may shave your face/neck. Please follow these instructions carefully:  1.  Shower with CHG Soap the night before surgery and the  morning of Surgery.  2.  If you choose to wash your hair, wash your hair first as usual with your  normal  shampoo.  3.  After you shampoo, rinse your hair and body thoroughly to remove the  shampoo.                           4.  Use CHG as you would any other liquid soap.  You can apply chg directly  to the skin and wash                       Gently with a scrungie or clean washcloth.  5.  Apply the CHG Soap to your body ONLY FROM THE NECK DOWN.   Do not use on face/ open                           Wound or open sores. Avoid contact with eyes, ears mouth and genitals  (private parts).                       Wash face,  Genitals (private parts) with your normal soap.             6.  Wash thoroughly, paying special attention to the area where your surgery  will be performed.  7.  Thoroughly rinse your body with warm water from the neck down.  8.  DO NOT shower/wash with your normal soap after using and rinsing off  the CHG Soap.                9.  Pat yourself dry with a clean towel.            10.  Wear clean pajamas.            11.  Place clean sheets on your bed the night of your first shower and do not  sleep with pets. Day of Surgery : Do not apply any lotions/deodorants the morning of surgery.  Please wear clean clothes to the hospital/surgery center.  FAILURE TO FOLLOW THESE INSTRUCTIONS MAY RESULT IN THE CANCELLATION OF YOUR SURGERY PATIENT SIGNATURE_________________________________  NURSE SIGNATURE__________________________________  ________________________________________________________________________

## 2021-05-07 ENCOUNTER — Encounter (HOSPITAL_COMMUNITY): Payer: Self-pay

## 2021-05-07 ENCOUNTER — Encounter (HOSPITAL_COMMUNITY)
Admission: RE | Admit: 2021-05-07 | Discharge: 2021-05-07 | Disposition: A | Discharge status: Home / Self Care | Payer: Medicare Other | Source: Ambulatory Visit | Attending: Urology | Admitting: Urology

## 2021-05-07 ENCOUNTER — Other Ambulatory Visit: Payer: Self-pay

## 2021-05-07 VITALS — BP 152/93 | HR 79 | Temp 98.2°F | Resp 16 | Ht 71.0 in | Wt 152.0 lb

## 2021-05-07 DIAGNOSIS — I10 Essential (primary) hypertension: Secondary | ICD-10-CM | POA: Diagnosis not present

## 2021-05-07 DIAGNOSIS — Z01818 Encounter for other preprocedural examination: Secondary | ICD-10-CM | POA: Insufficient documentation

## 2021-05-07 HISTORY — DX: Unspecified asthma, uncomplicated: J45.909

## 2021-05-07 HISTORY — DX: Essential (primary) hypertension: I10

## 2021-05-07 HISTORY — DX: Malignant (primary) neoplasm, unspecified: C80.1

## 2021-05-07 HISTORY — DX: Dyspnea, unspecified: R06.00

## 2021-05-07 LAB — BASIC METABOLIC PANEL
Anion gap: 8 (ref 5–15)
BUN: 14 mg/dL (ref 8–23)
CO2: 24 mmol/L (ref 22–32)
Calcium: 9.3 mg/dL (ref 8.9–10.3)
Chloride: 105 mmol/L (ref 98–111)
Creatinine, Ser: 1.03 mg/dL (ref 0.61–1.24)
GFR, Estimated: >60 mL/min (ref >60)
Glucose, Bld: 113 mg/dL — ABNORMAL HIGH (ref 70–99)
Potassium: 3.9 mmol/L (ref 3.5–5.1)
Sodium: 137 mmol/L (ref 135–145)

## 2021-05-07 LAB — CBC
HCT: 44.8 % (ref 39.0–52.0)
Hemoglobin: 14.7 g/dL (ref 13.0–17.0)
MCH: 29 pg (ref 26.0–34.0)
MCHC: 32.8 g/dL (ref 30.0–36.0)
MCV: 88.4 fL (ref 80.0–100.0)
Platelets: 239 10*3/uL (ref 150–400)
RBC: 5.07 MIL/uL (ref 4.22–5.81)
RDW: 14.2 % (ref 11.5–15.5)
WBC: 5.1 10*3/uL (ref 4.0–10.5)
nRBC: 0 % (ref 0.0–0.2)

## 2021-05-23 ENCOUNTER — Encounter (HOSPITAL_COMMUNITY)
Admission: RE | Disposition: A | Discharge status: Home / Self Care | Payer: Self-pay | Source: Ambulatory Visit | Attending: Urology

## 2021-05-23 ENCOUNTER — Ambulatory Visit (HOSPITAL_COMMUNITY): Payer: Medicare Other | Admitting: Certified Registered Nurse Anesthetist

## 2021-05-23 ENCOUNTER — Ambulatory Visit (HOSPITAL_COMMUNITY)
Admission: RE | Admit: 2021-05-23 | Discharge: 2021-05-23 | Disposition: A | Discharge status: Home / Self Care | Payer: Medicare Other | Source: Ambulatory Visit | Attending: Urology | Admitting: Urology

## 2021-05-23 ENCOUNTER — Ambulatory Visit (HOSPITAL_COMMUNITY): Payer: Medicare Other

## 2021-05-23 ENCOUNTER — Other Ambulatory Visit: Payer: Self-pay

## 2021-05-23 ENCOUNTER — Encounter (HOSPITAL_COMMUNITY): Payer: Self-pay | Admitting: Urology

## 2021-05-23 ENCOUNTER — Ambulatory Visit (HOSPITAL_COMMUNITY): Payer: Medicare Other | Admitting: Physician Assistant

## 2021-05-23 DIAGNOSIS — F17210 Nicotine dependence, cigarettes, uncomplicated: Secondary | ICD-10-CM | POA: Diagnosis not present

## 2021-05-23 DIAGNOSIS — C61 Malignant neoplasm of prostate: Secondary | ICD-10-CM | POA: Diagnosis present

## 2021-05-23 DIAGNOSIS — N401 Enlarged prostate with lower urinary tract symptoms: Secondary | ICD-10-CM | POA: Insufficient documentation

## 2021-05-23 DIAGNOSIS — R351 Nocturia: Secondary | ICD-10-CM | POA: Insufficient documentation

## 2021-05-23 DIAGNOSIS — R3912 Poor urinary stream: Secondary | ICD-10-CM | POA: Insufficient documentation

## 2021-05-23 HISTORY — PX: GOLD SEED IMPLANT: SHX6343

## 2021-05-23 HISTORY — PX: TRANSRECTAL ULTRASOUND: SHX5146

## 2021-05-23 HISTORY — PX: SPACE OAR INSTILLATION: SHX6769

## 2021-05-23 SURGERY — INSERTION, GOLD SEEDS
Anesthesia: General | Site: Prostate

## 2021-05-23 MED ORDER — CEFAZOLIN SODIUM-DEXTROSE 2-4 GM/100ML-% IV SOLN
2.0000 g | Freq: Once | INTRAVENOUS | Status: AC
  Administered 2021-05-23: 14:00:00 2 g via INTRAVENOUS

## 2021-05-23 MED ORDER — PROPOFOL 10 MG/ML IV BOLUS
INTRAVENOUS | Status: DC | PRN
  Administered 2021-05-23: 150 mg via INTRAVENOUS

## 2021-05-23 MED ORDER — MIDAZOLAM HCL 2 MG/2ML IJ SOLN
INTRAMUSCULAR | Status: AC
  Filled 2021-05-23: qty 2

## 2021-05-23 MED ORDER — MIDAZOLAM HCL 5 MG/5ML IJ SOLN
INTRAMUSCULAR | Status: DC | PRN
  Administered 2021-05-23 (×2): 1 mg via INTRAVENOUS

## 2021-05-23 MED ORDER — ONDANSETRON HCL 4 MG/2ML IJ SOLN
INTRAMUSCULAR | Status: AC
  Filled 2021-05-23: qty 2

## 2021-05-23 MED ORDER — 0.9 % SODIUM CHLORIDE (POUR BTL) OPTIME
TOPICAL | Status: DC | PRN
  Administered 2021-05-23: 14:00:00 1000 mL

## 2021-05-23 MED ORDER — SODIUM CHLORIDE FLUSH 0.9 % IV SOLN
INTRAVENOUS | Status: DC | PRN
  Administered 2021-05-23: 10 mL

## 2021-05-23 MED ORDER — DEXAMETHASONE SODIUM PHOSPHATE 10 MG/ML IJ SOLN
INTRAMUSCULAR | Status: DC | PRN
Start: 2021-05-23 — End: 2021-05-23
  Administered 2021-05-23: 10 mg via INTRAVENOUS

## 2021-05-23 MED ORDER — FENTANYL CITRATE (PF) 100 MCG/2ML IJ SOLN
INTRAMUSCULAR | Status: AC
  Filled 2021-05-23: qty 2

## 2021-05-23 MED ORDER — SUGAMMADEX SODIUM 200 MG/2ML IV SOLN
INTRAVENOUS | Status: DC | PRN
  Administered 2021-05-23: 300 mg via INTRAVENOUS

## 2021-05-23 MED ORDER — SODIUM CHLORIDE (PF) 0.9 % IJ SOLN
INTRAMUSCULAR | Status: AC
  Filled 2021-05-23: qty 10

## 2021-05-23 MED ORDER — PHENYLEPHRINE 40 MCG/ML (10ML) SYRINGE FOR IV PUSH (FOR BLOOD PRESSURE SUPPORT)
PREFILLED_SYRINGE | INTRAVENOUS | Status: AC
  Filled 2021-05-23: qty 10

## 2021-05-23 MED ORDER — LIDOCAINE 2% (20 MG/ML) 5 ML SYRINGE
INTRAMUSCULAR | Status: DC | PRN
  Administered 2021-05-23: 100 mg via INTRAVENOUS

## 2021-05-23 MED ORDER — ACETAMINOPHEN 500 MG PO TABS: 1000.0000 mg | ORAL_TABLET | Freq: Once | ORAL | Status: AC

## 2021-05-23 MED ORDER — DEXAMETHASONE SODIUM PHOSPHATE 10 MG/ML IJ SOLN
INTRAMUSCULAR | Status: AC
  Filled 2021-05-23: qty 1

## 2021-05-23 MED ORDER — PHENYLEPHRINE HCL-NACL 20-0.9 MG/250ML-% IV SOLN
INTRAVENOUS | Status: DC | PRN
  Administered 2021-05-23: 35 ug/min via INTRAVENOUS

## 2021-05-23 MED ORDER — PHENYLEPHRINE 40 MCG/ML (10ML) SYRINGE FOR IV PUSH (FOR BLOOD PRESSURE SUPPORT)
PREFILLED_SYRINGE | INTRAVENOUS | Status: DC | PRN
  Administered 2021-05-23: 120 ug via INTRAVENOUS

## 2021-05-23 MED ORDER — LIDOCAINE HCL (PF) 2 % IJ SOLN
INTRAMUSCULAR | Status: AC
  Filled 2021-05-23: qty 5

## 2021-05-23 MED ORDER — FENTANYL CITRATE PF 50 MCG/ML IJ SOSY: 25.0000 ug | PREFILLED_SYRINGE | INTRAMUSCULAR | Status: DC | PRN

## 2021-05-23 MED ORDER — CHLORHEXIDINE GLUCONATE 0.12 % MT SOLN
15.0000 mL | Freq: Once | OROMUCOSAL | Status: AC
  Administered 2021-05-23: 11:00:00 15 mL via OROMUCOSAL

## 2021-05-23 MED ORDER — LACTATED RINGERS IV SOLN: INTRAVENOUS | Status: DC

## 2021-05-23 MED ORDER — FENTANYL CITRATE (PF) 100 MCG/2ML IJ SOLN
INTRAMUSCULAR | Status: DC | PRN
  Administered 2021-05-23 (×2): 50 ug via INTRAVENOUS

## 2021-05-23 MED ORDER — ORAL CARE MOUTH RINSE: 15.0000 mL | Freq: Once | OROMUCOSAL | Status: AC

## 2021-05-23 MED ORDER — CEFAZOLIN SODIUM-DEXTROSE 2-4 GM/100ML-% IV SOLN
INTRAVENOUS | Status: AC
  Filled 2021-05-23: qty 100

## 2021-05-23 MED ORDER — OXYCODONE HCL 5 MG/5ML PO SOLN: 5.0000 mg | Freq: Once | ORAL | Status: DC | PRN

## 2021-05-23 MED ORDER — PROPOFOL 10 MG/ML IV BOLUS
INTRAVENOUS | Status: AC
  Filled 2021-05-23: qty 20

## 2021-05-23 MED ORDER — ROCURONIUM BROMIDE 10 MG/ML (PF) SYRINGE
PREFILLED_SYRINGE | INTRAVENOUS | Status: DC | PRN
  Administered 2021-05-23: 60 mg via INTRAVENOUS

## 2021-05-23 MED ORDER — ONDANSETRON HCL 4 MG/2ML IJ SOLN: 4.0000 mg | Freq: Once | INTRAMUSCULAR | Status: DC | PRN

## 2021-05-23 MED ORDER — ONDANSETRON HCL 4 MG/2ML IJ SOLN
INTRAMUSCULAR | Status: DC | PRN
  Administered 2021-05-23: 4 mg via INTRAVENOUS

## 2021-05-23 MED ORDER — OXYCODONE HCL 5 MG PO TABS: 5.0000 mg | ORAL_TABLET | Freq: Once | ORAL | Status: DC | PRN

## 2021-05-23 MED ORDER — AMISULPRIDE (ANTIEMETIC) 5 MG/2ML IV SOLN: 10.0000 mg | Freq: Once | INTRAVENOUS | Status: DC | PRN

## 2021-05-23 MED ORDER — ACETAMINOPHEN 500 MG PO TABS
ORAL_TABLET | ORAL | Status: AC
  Administered 2021-05-23: 11:00:00 1000 mg via ORAL
  Filled 2021-05-23: qty 2

## 2021-05-23 MED ORDER — FLEET ENEMA 7-19 GM/118ML RE ENEM
1.0000 | ENEMA | Freq: Once | RECTAL | Status: AC
  Administered 2021-05-23: 11:00:00 1 via RECTAL
  Filled 2021-05-23: qty 1

## 2021-05-23 SURGICAL SUPPLY — 27 items
BAG COUNTER SPONGE SURGICOUNT (BAG) IMPLANT
BAG SPNG CNTER NS LX DISP (BAG)
COVER SURGICAL LIGHT HANDLE (MISCELLANEOUS) ×2 IMPLANT
DRSG TEGADERM 8X12 (GAUZE/BANDAGES/DRESSINGS) ×2 IMPLANT
DRSG TELFA 3X8 NADH (GAUZE/BANDAGES/DRESSINGS) ×2 IMPLANT
GLOVE SURG ENC MOIS LTX SZ7.5 (GLOVE) ×2 IMPLANT
GLOVE SURG NEOPR MICRO LF SZ8 (GLOVE) ×1 IMPLANT
GOWN STRL REUS W/TWL LRG LVL3 (GOWN DISPOSABLE) ×2 IMPLANT
IMPL SPACEOAR VUE SYSTEM (Spacer) IMPLANT
IMPLANT SPACEOAR VUE SYSTEM (Spacer) ×2 IMPLANT
INST BIOPSY MAXCORE 18GX25 (NEEDLE) IMPLANT
INSTR BIOPSY MAXCORE 18GX20 (NEEDLE) IMPLANT
MARKER GOLD PRELOAD 1.2X3 (Urological Implant) ×1 IMPLANT
NDL SAFETY ECLIPSE 18X1.5 (NEEDLE) IMPLANT
NDL SPNL 22GX7 QUINCKE BK (NEEDLE) IMPLANT
NEEDLE HYPO 18GX1.5 SHARP (NEEDLE)
NEEDLE SPNL 22GX7 QUINCKE BK (NEEDLE) ×2 IMPLANT
PACK CYSTO (CUSTOM PROCEDURE TRAY) ×2 IMPLANT
PAD DRESSING TELFA 3X8 NADH (GAUZE/BANDAGES/DRESSINGS) ×1 IMPLANT
PENCIL SMOKE EVACUATOR (MISCELLANEOUS) IMPLANT
SEED GOLD PRELOAD 1.2X3 (Urological Implant) ×2 IMPLANT
SURGILUBE 2OZ TUBE FLIPTOP (MISCELLANEOUS) ×2 IMPLANT
SYR 20ML LL LF (SYRINGE) IMPLANT
TOWEL OR 17X26 10 PK STRL BLUE (TOWEL DISPOSABLE) ×2 IMPLANT
TUBING CONNECTING 10 (TUBING) IMPLANT
UNDERPAD 30X36 HEAVY ABSORB (UNDERPADS AND DIAPERS) ×4 IMPLANT
WATER STERILE IRR 500ML POUR (IV SOLUTION) ×2 IMPLANT

## 2021-05-23 NOTE — Transfer of Care (Signed)
Immediate Anesthesia Transfer of Care Note  Patient: Emoni Yang  Procedure(s) Performed: GOLD SEED IMPLANT (Prostate) SPACE OAR INSTILLATION (Prostate) TRANSRECTAL ULTRASOUND (Prostate)  Patient Location: PACU  Anesthesia Type:General  Level of Consciousness: awake, alert  and oriented  Airway & Oxygen Therapy: Patient Spontanous Breathing and Patient connected to face mask oxygen  Post-op Assessment: Report given to RN and Post -op Vital signs reviewed and stable  Post vital signs: Reviewed and stable  Last Vitals:  Vitals Value Taken Time  BP 142/97 05/23/21 1407  Temp    Pulse 82 05/23/21 1408  Resp 17 05/23/21 1408  SpO2 100 % 05/23/21 1408  Vitals shown include unvalidated device data.  Last Pain:  Vitals:   05/23/21 1109  TempSrc:   PainSc: 0-No pain      Patients Stated Pain Goal: 5 (19/82/42 [id number])  Complications: No notable events documented.

## 2021-05-23 NOTE — Anesthesia Procedure Notes (Signed)
Procedure Name: Intubation Date/Time: 05/23/2021 1:34 PM Performed by: Maxwell Caul, CRNA Pre-anesthesia Checklist: Patient identified, Emergency Drugs available, Suction available and Patient being monitored Patient Re-evaluated:Patient Re-evaluated prior to induction Oxygen Delivery Method: Circle system utilized Preoxygenation: Pre-oxygenation with 100% oxygen Induction Type: IV induction Ventilation: Mask ventilation without difficulty Laryngoscope Size: Mac and 4 Grade View: Grade I Tube type: Oral Number of attempts: 1 Airway Equipment and Method: Stylet Placement Confirmation: ETT inserted through vocal cords under direct vision, positive ETCO2 and breath sounds checked- equal and bilateral Secured at: 21 cm Tube secured with: Tape Dental Injury: Teeth and Oropharynx as per pre-operative assessment

## 2021-05-23 NOTE — Anesthesia Postprocedure Evaluation (Signed)
Anesthesia Post Note  Patient: Jason Jones  Procedure(s) Performed: GOLD SEED IMPLANT (Prostate) SPACE OAR INSTILLATION (Prostate) TRANSRECTAL ULTRASOUND (Prostate)     Patient location during evaluation: PACU Anesthesia Type: General Level of consciousness: awake and alert Pain management: pain level controlled Vital Signs Assessment: post-procedure vital signs reviewed and stable Respiratory status: spontaneous breathing, nonlabored ventilation and respiratory function stable Cardiovascular status: blood pressure returned to baseline and stable Postop Assessment: no apparent nausea or vomiting Anesthetic complications: no   No notable events documented.  Last Vitals:  Vitals:   05/23/21 1445 05/23/21 1500  BP: (!) 137/98 (!) 136/96  Pulse:  84  Resp: 18 20  Temp: 36.4 C 36.5 C  SpO2: 94% 94%    Last Pain:  Vitals:   05/23/21 1445  TempSrc:   PainSc: 0-No pain                 Lidia Collum

## 2021-05-23 NOTE — Anesthesia Preprocedure Evaluation (Addendum)
Anesthesia Evaluation  Patient identified by MRN, date of birth, ID band Patient awake    Reviewed: Allergy & Precautions, NPO status , Patient's Chart, lab work & pertinent test results  History of Anesthesia Complications Negative for: history of anesthetic complications  Airway Mallampati: II  TM Distance: >3 FB Neck ROM: Full    Dental  (+) Dental Advisory Given, Edentulous Upper,    Pulmonary asthma , Current SmokerPatient did not abstain from smoking.,    Pulmonary exam normal        Cardiovascular hypertension, Pt. on medications Normal cardiovascular exam     Neuro/Psych negative neurological ROS     GI/Hepatic Neg liver ROS, GERD  ,  Endo/Other  negative endocrine ROS  Renal/GU negative Renal ROS   Prostate cancer    Musculoskeletal negative musculoskeletal ROS (+)   Abdominal   Peds  Hematology negative hematology ROS (+)   Anesthesia Other Findings   Reproductive/Obstetrics                          Anesthesia Physical Anesthesia Plan  ASA: 2  Anesthesia Plan: General   Post-op Pain Management: Toradol IV (intra-op) and Tylenol PO (pre-op)   Induction: Intravenous  PONV Risk Score and Plan: 1 and Ondansetron, Dexamethasone, Treatment may vary due to age or medical condition and Midazolam  Airway Management Planned: Oral ETT  Additional Equipment: None  Intra-op Plan:   Post-operative Plan: Extubation in OR  Informed Consent: I have reviewed the patients History and Physical, chart, labs and discussed the procedure including the risks, benefits and alternatives for the proposed anesthesia with the patient or authorized representative who has indicated his/her understanding and acceptance.     Dental advisory given  Plan Discussed with:   Anesthesia Plan Comments:         Anesthesia Quick Evaluation

## 2021-05-23 NOTE — Progress Notes (Signed)
°  Radiation Oncology         [phone]) [phone] ________________________________  Name: Jason Jones MRN: [id number]  Date: 03/29/2021  DOB: 10/18/1950  Chart Note:  I assisted the Urologist using the radiation oncology transrectal ultrasound stepper in the OR.  The procedure went well with good placement and positioning of three fiducial markers and SpaceOAR gel.  Plan:  At this point, the patient is set up to proceed with IMRT simulation on 05/25/21.  ________________________________  Sheral Apley. Tammi Klippel, M.D.

## 2021-05-23 NOTE — Op Note (Signed)
Operative Note   Preoperative diagnosis:  1.  Grade 3 prostate cancer    Postoperative diagnosis: 1.  Same    Procedure(s): 1.  Transrectal ultrasound guided prostatic gold seed fiducial marker and Space OAR placement   Surgeon: Ellison Hughs, MD   Assistants:  None    Anesthesia: Endotracheal   Complications:  None   EBL:  <5 mL   Specimens: 1. None   Drains/Catheters: 1.  None   Intraoperative findings:   Fiducial markers were placed at the prostatic base, mid-gland and apex.  SpaceOAR was placed with good separation of the prostate and rectum.    Indication: Jason Jones is a 70 year old male with a history of grade 3 prostate cancer.  He is planning undergo external beam radiation with Dr. Tammi Klippel as primary treatment of his cancer.  He is here today for gold seed fiducial marker and SpaceOAR placement.  He has been consented for the above procedures, voices understanding and wishes to proceed.   Description of procedure:   After informed consent the patient was brought to the major OR, placed on the table and administered general anesthesia. He was then moved to the modified lithotomy position with his perineum perpendicular to the floor. His perineum and genitalia were then sterilely prepped. An official timeout was then performed.    Real time transrectal ultrasonography was used visualize the prostate.  Gold seed fiducial markers were then placed transperineally at the prostatic base, mid gland and apex.   I then proceeded with placement of SpaceOAR by introducing a needle with the bevel angled inferiorly approximately 2 cm superior to the anus. This was angled downward and under direct ultrasound was placed within the space between the prostatic capsule and rectum. This was confirmed with a small amount of sterile saline injected and this was performed under direct ultrasound. I then attached the SpaceOAR to the needle and injected this in the space between the  prostate and rectum with good placement noted.  The patient tolerated the procedure well and was transferred to the postanesthesia in stable condition.   Plan: Follow-up in 4 months with PSA

## 2021-05-23 NOTE — H&P (Signed)
PRE-OP H&P  Office Visit Report     05/16/2021   --------------------------------------------------------------------------------   Jason Jones  MRN: [id number]  DOB: 11-26-1950, 70 year old Male PRIMARY CARE:  Lillia Corporal Lady Gary), MD  REFERRING:  Lillia Corporal Lady Gary), MD  PROVIDER:  Ellison Hughs, M.D.  TREATING:  Jiles Crocker, NP  LOCATION:  [facility] [phone]     --------------------------------------------------------------------------------   CC/HPI: Prostate cancer profile  Stage: T1c  PSA:13.3  Biopsy 01/23/21 , 3/21 cores positive: Left lateral base - 3+4=7 (6%), left lateral apex 3+3=6 (3%), right medial mid 4+3=7 (13%) 3 regions of interest on the MRI, all negative Prostate volume: 90gm   Patient is intermittently able to obtain erections, often not good enough for intercourse.   The patient takes tamsulosin for his voiding symptoms. He intermittently has a weak stream, but denies incontinence. Has no history of recurring urinary tract infections.   The patient has past surgical history of inguinal hernia repair and shoulder repair. The patient had a MRI prior to his most recent biopsies demonstrating 3 regions of interest, 2 of these were PI-RADS 4 lesions 1 was a PI-RADS 3 lesion. These were all biopsied and were negative.   The patient smokes a half pack of cigarettes per day, but has reasonable exercise tolerance. Patient is current divorced and lives alone.   05/16/2021: Here for preoperative appointment prior to undergoing fiducial marker placement and SpaceOAR in anticipation of beginning XRT for management of his underlying prostate cancer diagnosis. Overall doing well, no changes in past medical history, no new prescription medications taken on a daily basis, no interval surgical or procedural intervention. Denies any recent fevers or chills, nausea/vomiting, interval treatment for UTI or other infection. Denies recent  chest pain, shortness of breath, headaches/lightheadedness or dizziness, numbness or tingling in the extremities. He continues tamsulosin for management of underlying BPH symptoms. He does have an intermittent weak stream but voiding symptoms are grossly stable with nonbothersome frequency and nocturia. He has had no interval dysuria or gross hematuria. He does have some intermittent constipation but managed well with over-the-counter stool softeners.     ALLERGIES: None   MEDICATIONS: Levaquin 500 mg tablet 1 tablet PO As Directed  Hydrocodone-Acetaminophen 5 mg-325 mg tablet 1 tablet PO As Directed  Levofloxacin 750 mg tablet 1 tablet PO As Directed Take one hour prior to your scheduled procedure.     GU PSH: Prostate Needle Biopsy - 01/23/2021, 2019     NON-GU PSH: Shoulder Arthroscopy/surgery, Right - 2018 Surgical Pathology, Gross And Microscopic Examination For Prostate Needle - 01/23/2021, 2019     GU PMH: ED due to arterial insufficiency - 03/13/2021 Prostate Cancer - 03/13/2021, - 02/05/2021 Weak Urinary Stream - 03/13/2021, - 02/05/2021, - 05/11/2020, - 2018 BPH w/LUTS - 02/05/2021, - 05/11/2020, - 2018 Elevated PSA - 01/23/2021, - 05/11/2020, - 2018 Nocturia - 2018    NON-GU PMH: Constipation, unspecified - 05/11/2020 Arthritis    FAMILY HISTORY: nephrolithiasis - Mother throat cancer - Mother   SOCIAL HISTORY: Marital Status: Married Preferred Language: English; Ethnicity: Not Hispanic Or Latino; Race: Black or African American Current Smoking Status: Patient smokes. Has smoked since 02/08/1970. Smokes 1/2 pack per day.   Tobacco Use Assessment Completed: Used Tobacco in last 30 days? Drinks 4 drinks per week. Types of alcohol consumed: Beer. Moderate Drinker.  Drinks 1 caffeinated drink per day. Patient's occupation is/was Retired.    REVIEW OF SYSTEMS:    GU Review Male:   Patient  reports get up at night to urinate and trouble starting your stream. Patient denies  frequent urination, hard to postpone urination, burning/ pain with urination, leakage of urine, stream starts and stops, have to strain to urinate , erection problems, and penile pain.  Gastrointestinal (Upper):   Patient denies vomiting, indigestion/ heartburn, and nausea.  Gastrointestinal (Lower):   Patient reports constipation. Patient denies diarrhea.  Constitutional:   Patient denies fever, night sweats, weight loss, and fatigue.  Skin:   Patient denies skin rash/ lesion and itching.  Eyes:   Patient denies blurred vision and double vision.  Ears/ Nose/ Throat:   Patient denies sore throat and sinus problems.  Hematologic/Lymphatic:   Patient denies swollen glands and easy bruising.  Cardiovascular:   Patient denies leg swelling and chest pains.  Respiratory:   Patient denies cough and shortness of breath.  Endocrine:   Patient denies excessive thirst.  Musculoskeletal:   Patient reports joint pain. Patient denies back pain.  Neurological:   Patient denies headaches and dizziness.  Psychologic:   Patient denies depression and anxiety.   Notes: pt c/o burning right side abdomen  Updated from previous visit 05/11/2020 with review from patient as noted above.   VITAL SIGNS:      05/16/2021 08:05 AM  Weight 153 lb / 69.4 kg  Height 71 in / 180.34 cm  BP 126/85 mmHg  Pulse 108 /min  Temperature 98.0 F / 36.6 C  BMI 21.3 kg/m   MULTI-SYSTEM PHYSICAL EXAMINATION:    Constitutional: Well-nourished. No physical deformities. Normally developed. Good grooming.  Neck: Neck symmetrical, not swollen. Normal tracheal position.  Respiratory: No labored breathing, no use of accessory muscles.   Cardiovascular: Normal temperature, normal extremity pulses, no swelling, no varicosities.  Skin: No paleness, no jaundice, no cyanosis. No lesion, no ulcer, no rash.  Neurologic / Psychiatric: Oriented to time, oriented to place, oriented to person. No depression, no anxiety, no agitation.   Gastrointestinal: No mass, no tenderness, no rigidity, non obese abdomen.  Musculoskeletal: Normal gait and station of head and neck.     Complexity of Data:  Source Of History:  Patient, Medical Record Summary  Lab Test Review:   PSA  Records Review:   Pathology Reports, Previous Doctor Records, Previous Hospital Records, Previous Patient Records  Urine Test Review:   Urinalysis   11/27/20 05/11/20 05/18/18 10/16/17 03/06/17  PSA  Total PSA 13.30 ng/mL 13.40 ng/mL 8.40 ng/mL 9.46 ng/mL 9.46 ng/mL  Free PSA     2.19 ng/mL  % Free PSA     23 % PSA    05/16/21  Urinalysis  Urine Appearance Clear   Urine Color Yellow   Urine Glucose Neg mg/dL  Urine Bilirubin Neg mg/dL  Urine Ketones Neg mg/dL  Urine Specific Gravity 1.015   Urine Blood Neg ery/uL  Urine pH 6.0   Urine Protein Neg mg/dL  Urine Urobilinogen 0.2 mg/dL  Urine Nitrites Neg   Urine Leukocyte Esterase Neg leu/uL   PROCEDURES:          Urinalysis Dipstick Dipstick Cont'd  Color: Yellow Bilirubin: Neg mg/dL  Appearance: Clear Ketones: Neg mg/dL  Specific Gravity: 1.015 Blood: Neg ery/uL  pH: 6.0 Protein: Neg mg/dL  Glucose: Neg mg/dL Urobilinogen: 0.2 mg/dL    Nitrites: Neg    Leukocyte Esterase: Neg leu/uL    ASSESSMENT:      ICD-10 Details  1 GU:   Prostate Cancer - C61 Chronic, Threat to Bodily Function  2  Weak Urinary Stream - R39.12 Chronic, Stable   PLAN:           Orders Labs Urine Culture          Document Letter(s):  Created for Patient: Clinical Summary         Notes: 70 year old male with T1c, grade 3 prostate cancer who has elected to proceed with external beam radiation therapy for primary treatment  The risk, benefits and alternatives of transrectal ultrasound guided cold seed fiducial marker and SpaceOAR was discussed in detail.  Risk include, but are not limited to, bleeding, infection, rectal injury, changes in bowel habits, MI, CVA, DVT and the inherent risk of general  anesthesia.  He voices understanding and wishes to proceed.

## 2021-05-24 ENCOUNTER — Telehealth: Payer: Self-pay | Admitting: *Deleted

## 2021-05-24 ENCOUNTER — Encounter (HOSPITAL_COMMUNITY): Payer: Self-pay | Admitting: Urology

## 2021-05-24 NOTE — Telephone Encounter (Signed)
CALLED PATIENT TO REMIND OF SIM APPT. FOR 05-25-21- ARRIVAL TIME- 8:45 AM @ [facility], SPOKE WITH PATIENT AND HE IS AWARE OF THIS APPT.

## 2021-05-24 NOTE — Progress Notes (Signed)
°  Radiation Oncology         [phone]) [phone] ________________________________  Name: Jason Jones MRN: [id number]  Date: 05/25/2021  DOB: [birth year]  SIMULATION AND TREATMENT PLANNING NOTE    ICD-10-CM   1. Malignant neoplasm of prostate ([place])  C61       DIAGNOSIS:  70 y.o. gentleman with Stage T1c adenocarcinoma of the prostate with Gleason score of 4+3, and PSA of 13.3.  NARRATIVE:  The patient was brought to the [facility].  Identity was confirmed.  All relevant records and images related to the planned course of therapy were reviewed.  The patient freely provided informed written consent to proceed with treatment after reviewing the details related to the planned course of therapy. The consent form was witnessed and verified by the simulation staff.  Then, the patient was set-up in a stable reproducible supine position for radiation therapy.  A vacuum lock pillow device was custom fabricated to position his legs in a reproducible immobilized position.  Then, I performed a urethrogram under sterile conditions to identify the prostatic apex.  CT images were obtained.  Surface markings were placed.  The CT images were loaded into the planning software.  Then the prostate target and avoidance structures including the rectum, bladder, bowel and hips were contoured.  Treatment planning then occurred.  The radiation prescription was entered and confirmed.  A total of one complex treatment devices was fabricated. I have requested : Intensity Modulated Radiotherapy (IMRT) is medically necessary for this case for the following reason:  Rectal sparing.Marland Kitchen  PLAN:  The patient will receive 70 Gy in 28 fractions.  ________________________________  Sheral Apley Tammi Klippel, M.D.

## 2021-05-25 ENCOUNTER — Other Ambulatory Visit: Payer: Self-pay

## 2021-05-25 ENCOUNTER — Ambulatory Visit
Admission: RE | Admit: 2021-05-25 | Discharge: 2021-05-25 | Disposition: A | Discharge status: Home / Self Care | Payer: Medicare Other | Source: Ambulatory Visit | Attending: Radiation Oncology | Admitting: Radiation Oncology

## 2021-05-25 DIAGNOSIS — C61 Malignant neoplasm of prostate: Secondary | ICD-10-CM | POA: Diagnosis present

## 2021-05-25 DIAGNOSIS — Z510 Encounter for antineoplastic radiation therapy: Secondary | ICD-10-CM | POA: Diagnosis present

## 2021-06-05 DIAGNOSIS — Z510 Encounter for antineoplastic radiation therapy: Secondary | ICD-10-CM | POA: Diagnosis not present

## 2021-06-06 ENCOUNTER — Other Ambulatory Visit: Payer: Self-pay

## 2021-06-06 ENCOUNTER — Ambulatory Visit
Admission: RE | Admit: 2021-06-06 | Discharge: 2021-06-06 | Disposition: A | Discharge status: Home / Self Care | Payer: Medicare Other | Source: Ambulatory Visit | Attending: Radiation Oncology | Admitting: Radiation Oncology

## 2021-06-06 DIAGNOSIS — Z510 Encounter for antineoplastic radiation therapy: Secondary | ICD-10-CM | POA: Diagnosis not present

## 2021-06-07 ENCOUNTER — Ambulatory Visit
Admission: RE | Admit: 2021-06-07 | Discharge: 2021-06-07 | Disposition: A | Discharge status: Home / Self Care | Payer: Medicare Other | Source: Ambulatory Visit | Attending: Radiation Oncology | Admitting: Radiation Oncology

## 2021-06-07 DIAGNOSIS — Z510 Encounter for antineoplastic radiation therapy: Secondary | ICD-10-CM | POA: Diagnosis not present

## 2021-06-08 ENCOUNTER — Ambulatory Visit
Admission: RE | Admit: 2021-06-08 | Discharge: 2021-06-08 | Disposition: A | Discharge status: Home / Self Care | Payer: Medicare Other | Source: Ambulatory Visit | Attending: Radiation Oncology | Admitting: Radiation Oncology

## 2021-06-08 ENCOUNTER — Other Ambulatory Visit: Payer: Self-pay

## 2021-06-08 DIAGNOSIS — Z510 Encounter for antineoplastic radiation therapy: Secondary | ICD-10-CM | POA: Diagnosis not present

## 2021-06-12 ENCOUNTER — Ambulatory Visit
Admission: RE | Admit: 2021-06-12 | Discharge: 2021-06-12 | Disposition: A | Discharge status: Home / Self Care | Payer: Medicare Other | Source: Ambulatory Visit | Attending: Radiation Oncology | Admitting: Radiation Oncology

## 2021-06-12 ENCOUNTER — Other Ambulatory Visit: Payer: Self-pay

## 2021-06-12 DIAGNOSIS — C61 Malignant neoplasm of prostate: Secondary | ICD-10-CM | POA: Insufficient documentation

## 2021-06-12 DIAGNOSIS — Z510 Encounter for antineoplastic radiation therapy: Secondary | ICD-10-CM | POA: Insufficient documentation

## 2021-06-13 ENCOUNTER — Ambulatory Visit
Admission: RE | Admit: 2021-06-13 | Discharge: 2021-06-13 | Disposition: A | Discharge status: Home / Self Care | Payer: Medicare Other | Source: Ambulatory Visit | Attending: Radiation Oncology | Admitting: Radiation Oncology

## 2021-06-13 DIAGNOSIS — Z510 Encounter for antineoplastic radiation therapy: Secondary | ICD-10-CM | POA: Diagnosis not present

## 2021-06-14 ENCOUNTER — Other Ambulatory Visit: Payer: Self-pay

## 2021-06-14 ENCOUNTER — Ambulatory Visit
Admission: RE | Admit: 2021-06-14 | Discharge: 2021-06-14 | Disposition: A | Discharge status: Home / Self Care | Payer: Medicare Other | Source: Ambulatory Visit | Attending: Radiation Oncology | Admitting: Radiation Oncology

## 2021-06-14 DIAGNOSIS — Z510 Encounter for antineoplastic radiation therapy: Secondary | ICD-10-CM | POA: Diagnosis not present

## 2021-06-15 ENCOUNTER — Ambulatory Visit
Admission: RE | Admit: 2021-06-15 | Discharge: 2021-06-15 | Disposition: A | Discharge status: Home / Self Care | Payer: Medicare Other | Source: Ambulatory Visit | Attending: Radiation Oncology | Admitting: Radiation Oncology

## 2021-06-15 DIAGNOSIS — Z510 Encounter for antineoplastic radiation therapy: Secondary | ICD-10-CM | POA: Diagnosis not present

## 2021-06-18 ENCOUNTER — Ambulatory Visit
Admission: RE | Admit: 2021-06-18 | Discharge: 2021-06-18 | Disposition: A | Discharge status: Home / Self Care | Payer: Medicare Other | Source: Ambulatory Visit | Attending: Radiation Oncology | Admitting: Radiation Oncology

## 2021-06-18 ENCOUNTER — Other Ambulatory Visit: Payer: Self-pay

## 2021-06-18 DIAGNOSIS — Z510 Encounter for antineoplastic radiation therapy: Secondary | ICD-10-CM | POA: Diagnosis not present

## 2021-06-19 ENCOUNTER — Ambulatory Visit
Admission: RE | Admit: 2021-06-19 | Discharge: 2021-06-19 | Disposition: A | Discharge status: Home / Self Care | Payer: Medicare Other | Source: Ambulatory Visit | Attending: Radiation Oncology | Admitting: Radiation Oncology

## 2021-06-19 DIAGNOSIS — Z510 Encounter for antineoplastic radiation therapy: Secondary | ICD-10-CM | POA: Diagnosis not present

## 2021-06-20 ENCOUNTER — Other Ambulatory Visit: Payer: Self-pay

## 2021-06-20 ENCOUNTER — Ambulatory Visit
Admission: RE | Admit: 2021-06-20 | Discharge: 2021-06-20 | Disposition: A | Discharge status: Home / Self Care | Payer: Medicare Other | Source: Ambulatory Visit | Attending: Radiation Oncology | Admitting: Radiation Oncology

## 2021-06-20 DIAGNOSIS — Z510 Encounter for antineoplastic radiation therapy: Secondary | ICD-10-CM | POA: Diagnosis not present

## 2021-06-21 ENCOUNTER — Ambulatory Visit
Admission: RE | Admit: 2021-06-21 | Discharge: 2021-06-21 | Disposition: A | Discharge status: Home / Self Care | Payer: Medicare Other | Source: Ambulatory Visit | Attending: Radiation Oncology | Admitting: Radiation Oncology

## 2021-06-21 DIAGNOSIS — Z510 Encounter for antineoplastic radiation therapy: Secondary | ICD-10-CM | POA: Diagnosis not present

## 2021-06-22 ENCOUNTER — Ambulatory Visit
Admission: RE | Admit: 2021-06-22 | Discharge: 2021-06-22 | Disposition: A | Discharge status: Home / Self Care | Payer: Medicare Other | Source: Ambulatory Visit | Attending: Radiation Oncology | Admitting: Radiation Oncology

## 2021-06-22 ENCOUNTER — Other Ambulatory Visit: Payer: Self-pay

## 2021-06-22 DIAGNOSIS — Z510 Encounter for antineoplastic radiation therapy: Secondary | ICD-10-CM | POA: Diagnosis not present

## 2021-06-25 ENCOUNTER — Other Ambulatory Visit: Payer: Self-pay

## 2021-06-25 ENCOUNTER — Ambulatory Visit
Admission: RE | Admit: 2021-06-25 | Discharge: 2021-06-25 | Disposition: A | Discharge status: Home / Self Care | Payer: Medicare Other | Source: Ambulatory Visit | Attending: Radiation Oncology | Admitting: Radiation Oncology

## 2021-06-25 DIAGNOSIS — Z510 Encounter for antineoplastic radiation therapy: Secondary | ICD-10-CM | POA: Diagnosis not present

## 2021-06-26 ENCOUNTER — Ambulatory Visit
Admission: RE | Admit: 2021-06-26 | Discharge: 2021-06-26 | Disposition: A | Discharge status: Home / Self Care | Payer: Medicare Other | Source: Ambulatory Visit | Attending: Radiation Oncology | Admitting: Radiation Oncology

## 2021-06-26 DIAGNOSIS — Z510 Encounter for antineoplastic radiation therapy: Secondary | ICD-10-CM | POA: Diagnosis not present

## 2021-06-27 ENCOUNTER — Other Ambulatory Visit: Payer: Self-pay

## 2021-06-27 ENCOUNTER — Ambulatory Visit
Admission: RE | Admit: 2021-06-27 | Discharge: 2021-06-27 | Disposition: A | Discharge status: Home / Self Care | Payer: Medicare Other | Source: Ambulatory Visit | Attending: Radiation Oncology | Admitting: Radiation Oncology

## 2021-06-27 DIAGNOSIS — Z510 Encounter for antineoplastic radiation therapy: Secondary | ICD-10-CM | POA: Diagnosis not present

## 2021-06-28 ENCOUNTER — Ambulatory Visit
Admission: RE | Admit: 2021-06-28 | Discharge: 2021-06-28 | Disposition: A | Discharge status: Home / Self Care | Payer: Medicare Other | Source: Ambulatory Visit | Attending: Radiation Oncology | Admitting: Radiation Oncology

## 2021-06-28 DIAGNOSIS — Z510 Encounter for antineoplastic radiation therapy: Secondary | ICD-10-CM | POA: Diagnosis not present

## 2021-06-29 ENCOUNTER — Ambulatory Visit
Admission: RE | Admit: 2021-06-29 | Discharge: 2021-06-29 | Disposition: A | Discharge status: Home / Self Care | Payer: Medicare Other | Source: Ambulatory Visit | Attending: Radiation Oncology | Admitting: Radiation Oncology

## 2021-06-29 ENCOUNTER — Other Ambulatory Visit: Payer: Self-pay

## 2021-06-29 DIAGNOSIS — Z510 Encounter for antineoplastic radiation therapy: Secondary | ICD-10-CM | POA: Diagnosis not present

## 2021-07-02 ENCOUNTER — Ambulatory Visit
Admission: RE | Admit: 2021-07-02 | Discharge: 2021-07-02 | Disposition: A | Discharge status: Home / Self Care | Payer: Medicare Other | Source: Ambulatory Visit | Attending: Radiation Oncology | Admitting: Radiation Oncology

## 2021-07-02 ENCOUNTER — Other Ambulatory Visit: Payer: Self-pay

## 2021-07-02 DIAGNOSIS — Z510 Encounter for antineoplastic radiation therapy: Secondary | ICD-10-CM | POA: Diagnosis not present

## 2021-07-03 ENCOUNTER — Ambulatory Visit
Admission: RE | Admit: 2021-07-03 | Discharge: 2021-07-03 | Disposition: A | Discharge status: Home / Self Care | Payer: Medicare Other | Source: Ambulatory Visit | Attending: Radiation Oncology | Admitting: Radiation Oncology

## 2021-07-03 DIAGNOSIS — Z510 Encounter for antineoplastic radiation therapy: Secondary | ICD-10-CM | POA: Diagnosis not present

## 2021-07-04 ENCOUNTER — Other Ambulatory Visit: Payer: Self-pay

## 2021-07-04 ENCOUNTER — Ambulatory Visit
Admission: RE | Admit: 2021-07-04 | Discharge: 2021-07-04 | Disposition: A | Discharge status: Home / Self Care | Payer: Medicare Other | Source: Ambulatory Visit | Attending: Radiation Oncology | Admitting: Radiation Oncology

## 2021-07-04 DIAGNOSIS — Z510 Encounter for antineoplastic radiation therapy: Secondary | ICD-10-CM | POA: Diagnosis not present

## 2021-07-05 ENCOUNTER — Ambulatory Visit
Admission: RE | Admit: 2021-07-05 | Discharge: 2021-07-05 | Disposition: A | Discharge status: Home / Self Care | Payer: Medicare Other | Source: Ambulatory Visit | Attending: Radiation Oncology | Admitting: Radiation Oncology

## 2021-07-05 DIAGNOSIS — Z510 Encounter for antineoplastic radiation therapy: Secondary | ICD-10-CM | POA: Diagnosis not present

## 2021-07-06 ENCOUNTER — Ambulatory Visit
Admission: RE | Admit: 2021-07-06 | Discharge: 2021-07-06 | Disposition: A | Discharge status: Home / Self Care | Payer: Medicare Other | Source: Ambulatory Visit | Attending: Radiation Oncology | Admitting: Radiation Oncology

## 2021-07-06 DIAGNOSIS — Z510 Encounter for antineoplastic radiation therapy: Secondary | ICD-10-CM | POA: Diagnosis not present

## 2021-07-09 ENCOUNTER — Other Ambulatory Visit: Payer: Self-pay

## 2021-07-09 ENCOUNTER — Ambulatory Visit
Admission: RE | Admit: 2021-07-09 | Discharge: 2021-07-09 | Disposition: A | Discharge status: Home / Self Care | Payer: Medicare Other | Source: Ambulatory Visit | Attending: Radiation Oncology | Admitting: Radiation Oncology

## 2021-07-09 DIAGNOSIS — Z510 Encounter for antineoplastic radiation therapy: Secondary | ICD-10-CM | POA: Diagnosis not present

## 2021-07-10 ENCOUNTER — Ambulatory Visit
Admission: RE | Admit: 2021-07-10 | Discharge: 2021-07-10 | Disposition: A | Discharge status: Home / Self Care | Payer: Medicare Other | Source: Ambulatory Visit | Attending: Radiation Oncology | Admitting: Radiation Oncology

## 2021-07-10 DIAGNOSIS — Z510 Encounter for antineoplastic radiation therapy: Secondary | ICD-10-CM | POA: Diagnosis not present

## 2021-07-11 ENCOUNTER — Other Ambulatory Visit: Payer: Self-pay

## 2021-07-11 ENCOUNTER — Ambulatory Visit
Admission: RE | Admit: 2021-07-11 | Discharge: 2021-07-11 | Disposition: A | Discharge status: Home / Self Care | Payer: Medicare Other | Source: Ambulatory Visit | Attending: Radiation Oncology | Admitting: Radiation Oncology

## 2021-07-11 DIAGNOSIS — C61 Malignant neoplasm of prostate: Secondary | ICD-10-CM | POA: Diagnosis present

## 2021-07-11 DIAGNOSIS — Z510 Encounter for antineoplastic radiation therapy: Secondary | ICD-10-CM | POA: Insufficient documentation

## 2021-07-12 ENCOUNTER — Ambulatory Visit
Admission: RE | Admit: 2021-07-12 | Discharge: 2021-07-12 | Disposition: A | Discharge status: Home / Self Care | Payer: Medicare Other | Source: Ambulatory Visit | Attending: Radiation Oncology | Admitting: Radiation Oncology

## 2021-07-12 DIAGNOSIS — Z510 Encounter for antineoplastic radiation therapy: Secondary | ICD-10-CM | POA: Diagnosis not present

## 2021-07-13 ENCOUNTER — Ambulatory Visit
Admission: RE | Admit: 2021-07-13 | Discharge: 2021-07-13 | Disposition: A | Discharge status: Home / Self Care | Payer: Medicare Other | Source: Ambulatory Visit | Attending: Radiation Oncology | Admitting: Radiation Oncology

## 2021-07-13 ENCOUNTER — Other Ambulatory Visit: Payer: Self-pay

## 2021-07-13 DIAGNOSIS — Z510 Encounter for antineoplastic radiation therapy: Secondary | ICD-10-CM | POA: Diagnosis not present

## 2021-07-16 ENCOUNTER — Ambulatory Visit
Admission: RE | Admit: 2021-07-16 | Discharge: 2021-07-16 | Disposition: A | Discharge status: Home / Self Care | Payer: Medicare Other | Source: Ambulatory Visit | Attending: Radiation Oncology | Admitting: Radiation Oncology

## 2021-07-16 ENCOUNTER — Encounter: Payer: Self-pay | Admitting: Urology

## 2021-07-16 ENCOUNTER — Other Ambulatory Visit: Payer: Self-pay

## 2021-07-16 DIAGNOSIS — Z510 Encounter for antineoplastic radiation therapy: Secondary | ICD-10-CM | POA: Diagnosis not present

## 2021-07-16 DIAGNOSIS — C61 Malignant neoplasm of prostate: Secondary | ICD-10-CM

## 2021-08-07 ENCOUNTER — Encounter: Payer: Self-pay | Admitting: Urology

## 2021-08-07 NOTE — Progress Notes (Signed)
Spoke w/ patient, verified identity, and began nursing interview. Patient reports slight dysuria post eating spicy foods. No other issues reported at this time. ? ?Meaningful use complete. ?I-PSS score of 6 (mild). ?Currently on Flomax 0.4 as directed. ?Urology appointment- None currently -per patient. ? ?Reminded patient of his 10:00am-08/15/21 telephone appointment w/ Ashlyn Bruning PA-C. I left my extension [phone] in case patient needs anything. Patient verbalized understanding of information. ? ?Patient contact [phone] ?

## 2021-08-15 ENCOUNTER — Ambulatory Visit
Admission: RE | Admit: 2021-08-15 | Discharge: 2021-08-15 | Disposition: A | Discharge status: Home / Self Care | Payer: Medicare Other | Source: Ambulatory Visit | Attending: Urology | Admitting: Urology

## 2021-08-15 ENCOUNTER — Other Ambulatory Visit: Payer: Self-pay

## 2021-08-15 DIAGNOSIS — C61 Malignant neoplasm of prostate: Secondary | ICD-10-CM | POA: Insufficient documentation

## 2021-08-15 NOTE — Progress Notes (Signed)
?Radiation Oncology         ([phone]) [phone] ?________________________________ ? ?Name: Jason Jones MRN: [id number]  ?Date: 08/15/2021  DOB: [birth year] ? ?Post Treatment Note ? ?CC: Arthur Holms, NP  [facility]* ? ?Diagnosis:     71 y.o. gentleman with Stage T1c adenocarcinoma of the prostate with Gleason score of 4+3, and PSA of 13.3. ? ?Interval Since Last Radiation:  4.5 weeks  ?06/06/21 - 07/16/21:   The prostate was treated to 70 Gy in 28 fractions of 2.5 Gy ? ?Narrative:  I spoke with the patient to conduct his routine scheduled 1 month follow up visit via telephone to spare the patient unnecessary potential exposure in the healthcare setting during the current COVID-19 pandemic.  The patient was notified in advance and gave permission to proceed with this visit format. ? ?He tolerated radiation treatment relatively well with only minor urinary irritation and modest fatigue.  He did report increased nocturia 4-5 times per night despite continuing to take his Flomax twice daily as prescribed.  He also reported mild skin irritation and mild diarrhea with occasional abdominal bloating and cramping.                             ? ?On review of systems, the patient states that he is doing well in general.  He reports gradual improvement in his LUTS and is almost back to his baseline at this point, still taking the Flomax twice daily as prescribed.  He denies dysuria, gross hematuria, straining to void, incomplete bladder emptying or incontinence.  He has continued with occasional loose stool/diarrhea but reports that this is gradually improving as well.  He specifically denies abdominal pain, nausea, vomiting or constipation.  He reports a healthy appetite and is maintaining his weight.  He continues with mild fatigue but reports that the skin irritation has completely resolved and overall, he is quite pleased with his progress to date. ? ?ALLERGIES:  has No Known Allergies. ? ?Meds: ?Current Outpatient  Medications  ?Medication Sig Dispense Refill  ? amLODipine (NORVASC) 2.5 MG tablet Take 2.5 mg by mouth daily.    ? cholecalciferol (VITAMIN D3) 25 MCG (1000 UNIT) tablet Take 1,000 Units by mouth daily.    ? fluticasone (FLONASE) 50 MCG/ACT nasal spray Place 1 spray into both nostrils daily as needed for allergies.    ? meloxicam (MOBIC) 15 MG tablet Take 15 mg by mouth daily.    ? sodium chloride (OCEAN) 0.65 % SOLN nasal spray Place 1 spray into both nostrils as needed for congestion.    ? tamsulosin (FLOMAX) 0.4 MG CAPS capsule Take 0.4 mg by mouth every 12 (twelve) hours.    ? ?No current facility-administered medications for this encounter.  ? ? ?Physical Findings: ? vitals were not taken for this visit.  ?Pain Assessment ?Pain Score: 0-No pain/10 ?Unable to assess due to telephone follow-up visit format. ? ?Lab Findings: ?Lab Results  ?Component Value Date  ? WBC 5.1 05/07/2021  ? HGB 14.7 05/07/2021  ? HCT 44.8 05/07/2021  ? MCV 88.4 05/07/2021  ? PLT 239 05/07/2021  ? ? ? ?Radiographic Findings: ?No results found. ? ?Impression/Plan: ?1.   71 y.o. gentleman with Stage T1c adenocarcinoma of the prostate with Gleason score of 4+3, and PSA of 13.3. ?He will continue to follow up with urology for ongoing PSA determinations but does not currently have any follow-up appointment scheduled with Dr. Lovena Neighbours to his knowledge.  He understands what to expect with regards to PSA monitoring going forward and will continue taking the Flomax twice daily as prescribed. I will look forward to following his response to treatment via correspondence with urology, and would be happy to continue to participate in his care if clinically indicated. I talked to the patient about what to expect in the future, including his risk for erectile dysfunction and rectal bleeding. I encouraged him to call or return to the office if he has any questions regarding his previous radiation or possible radiation side effects. He was comfortable with  this plan and will follow up as needed.  ? ? ? ?Nicholos Johns, PA-C  ?

## 2021-08-15 NOTE — Progress Notes (Signed)
°  Radiation Oncology         [phone]) [phone] ________________________________  Name: Jason Jones MRN: [id number]  Date: 07/16/2021  DOB: 08/28/1950  End of Treatment Note  Diagnosis:   71 y.o. gentleman with Stage T1c adenocarcinoma of the prostate with Gleason score of 4+3, and PSA of 13.3.     Indication for treatment:  Curative, Definitive Radiotherapy       Radiation treatment dates:   06/06/21 - 07/16/21  Site/dose:   The prostate was treated to 70 Gy in 28 fractions of 2.5 Gy  Beams/energy:   The patient was treated with IMRT using volumetric arc therapy delivering 6 MV X-rays to clockwise and counterclockwise circumferential arcs with a 90 degree collimator offset to avoid dose scalloping.  Image guidance was performed with daily cone beam CT prior to each fraction to align to gold markers in the prostate and assure proper bladder and rectal fill volumes.  Immobilization was achieved with [employer] custom mold.  Narrative: The patient tolerated radiation treatment relatively well with only minor urinary irritation and modest fatigue.  He did report increased nocturia 4-5 times per night despite continuing to take his Flomax twice daily as prescribed.  He also reported mild skin irritation and mild diarrhea with occasional abdominal bloating and cramping.  Plan: The patient has completed radiation treatment. He will return to radiation oncology clinic for routine followup in one month. I advised him to call or return sooner if he has any questions or concerns related to his recovery or treatment. ________________________________  Sheral Apley. Tammi Klippel, M.D.

## 2021-09-10 ENCOUNTER — Telehealth: Payer: Self-pay | Admitting: *Deleted

## 2021-10-09 ENCOUNTER — Telehealth: Payer: Self-pay | Admitting: *Deleted

## 2021-11-16 ENCOUNTER — Telehealth: Payer: Self-pay | Admitting: *Deleted

## 2021-11-23 ENCOUNTER — Inpatient Hospital Stay: Payer: Medicare Other | Admitting: *Deleted

## 2021-11-23 ENCOUNTER — Telehealth: Payer: Self-pay | Admitting: *Deleted

## 2021-11-23 NOTE — Telephone Encounter (Signed)
Rescheduled appointment per provider. Patient is aware of the changes made to his upcoming appointment.

## 2021-11-30 ENCOUNTER — Other Ambulatory Visit: Payer: Self-pay

## 2021-11-30 ENCOUNTER — Inpatient Hospital Stay: Payer: Medicare Other | Attending: Adult Health | Admitting: *Deleted

## 2021-11-30 DIAGNOSIS — C61 Malignant neoplasm of prostate: Secondary | ICD-10-CM | POA: Diagnosis not present

## 2021-11-30 NOTE — Progress Notes (Signed)
  2 Identifiers used for verification purposes only. No vitals taken as this was a telephone visit. Allergies and medications reviewed/ updated. Pt denies pain today. Pt states he is doing well. He has no issues with urine flow nor urgency. He is using Flomax and says it is working well for him. Denies burning with urination. He is sleeping through the night mostly, only getting up to bathroom maybe once, he says. Pt is smoking cigarettes, a pack every 2 days. He has been smoking over 50 years. I have  placed an order for a low dose CT of lungs today. Pt does not exercise regularly. We discussed the importance of exercise and I sent him a copy of some online chair exercises he can do at home. Last saw PCP a month ago. Last colonoscopy 06/2020 at [facility]. SP reviewed and completed.

## 2021-12-18 ENCOUNTER — Ambulatory Visit (HOSPITAL_COMMUNITY)
Admission: RE | Admit: 2021-12-18 | Discharge: 2021-12-18 | Disposition: A | Discharge status: Home / Self Care | Payer: Medicare Other | Source: Ambulatory Visit | Attending: Adult Health | Admitting: Adult Health

## 2021-12-18 DIAGNOSIS — K209 Esophagitis, unspecified without bleeding: Secondary | ICD-10-CM | POA: Diagnosis not present

## 2021-12-18 DIAGNOSIS — J439 Emphysema, unspecified: Secondary | ICD-10-CM | POA: Insufficient documentation

## 2021-12-18 DIAGNOSIS — K2289 Other specified disease of esophagus: Secondary | ICD-10-CM | POA: Insufficient documentation

## 2021-12-18 DIAGNOSIS — I700 Atherosclerosis of aorta: Secondary | ICD-10-CM | POA: Insufficient documentation

## 2021-12-18 DIAGNOSIS — Z122 Encounter for screening for malignant neoplasm of respiratory organs: Secondary | ICD-10-CM | POA: Insufficient documentation

## 2021-12-18 DIAGNOSIS — C61 Malignant neoplasm of prostate: Secondary | ICD-10-CM | POA: Diagnosis present

## 2021-12-18 DIAGNOSIS — Q408 Other specified congenital malformations of upper alimentary tract: Secondary | ICD-10-CM | POA: Insufficient documentation

## 2021-12-18 DIAGNOSIS — F17210 Nicotine dependence, cigarettes, uncomplicated: Secondary | ICD-10-CM | POA: Insufficient documentation

## 2021-12-19 ENCOUNTER — Encounter: Payer: Self-pay | Admitting: Surgery

## 2021-12-19 NOTE — Progress Notes (Signed)
Per Wilber Bihari, NP, I faxed the pt's primary care provider, Arthur Holms at [facility], the results of the pt's low dose chest CT.  Mendel Ryder asked [facility] to repeat the low dose chest CT next year when due per the recommendations listed in the scan results.

## 2021-12-21 ENCOUNTER — Other Ambulatory Visit: Payer: Self-pay | Admitting: Student

## 2021-12-21 DIAGNOSIS — L602 Onychogryphosis: Secondary | ICD-10-CM

## 2021-12-27 ENCOUNTER — Ambulatory Visit
Admission: RE | Admit: 2021-12-27 | Discharge: 2021-12-27 | Disposition: A | Discharge status: Home / Self Care | Payer: Medicare Other | Source: Ambulatory Visit | Attending: Student | Admitting: Student

## 2021-12-27 DIAGNOSIS — L602 Onychogryphosis: Secondary | ICD-10-CM

## 2022-01-11 ENCOUNTER — Ambulatory Visit (INDEPENDENT_AMBULATORY_CARE_PROVIDER_SITE_OTHER): Payer: Medicare Other | Admitting: Podiatry

## 2022-01-11 ENCOUNTER — Encounter: Payer: Self-pay | Admitting: Podiatry

## 2022-01-11 DIAGNOSIS — L600 Ingrowing nail: Secondary | ICD-10-CM | POA: Diagnosis not present

## 2022-01-11 DIAGNOSIS — M21619 Bunion of unspecified foot: Secondary | ICD-10-CM | POA: Diagnosis not present

## 2022-01-11 NOTE — Patient Instructions (Signed)

## 2022-01-11 NOTE — Progress Notes (Signed)
Subjective:   Patient ID: Jason Jones, male   DOB: 71 y.o.   MRN: [id number]   HPI Patient presents with nails that are generalized thick but the right hallux is very thickened deformed and painful from a dorsal direction.  Patient states that they cannot cut it and it is digging into the second toe and they like it removed.  Patient does smoke a half a pack of cigarettes per day and is not especially active   Review of Systems  All other systems reviewed and are negative.       Objective:  Physical Exam Vitals and nursing note reviewed.  Constitutional:      Appearance: He is well-developed.  Pulmonary:     Effort: Pulmonary effort is normal.  Musculoskeletal:        General: Normal range of motion.  Skin:    General: Skin is warm.  Neurological:     Mental Status: He is alert.     Neurovascular status intact muscle strength found to be adequate range of motion adequate with severe thickening dystrophic changes of the right hallux nail bed that is moderately tender when pressed and digging into the second toe due to the structural deformity.  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  They want this fixed understanding correction and I will out them to review and then signed consent form and I infiltrated the right hallux 60 mg like Marcaine mixture sterile prep done using sterile instrumentation remove the hallux nail exposed matrix applied phenol 5 applications 30 seconds followed by alcohol by sterile dressing gave instructions on soaks and to leave dressing on 24 hours take it off earlier if it should start throbbing and patient will call if any issues were  to occur

## 2022-03-24 ENCOUNTER — Inpatient Hospital Stay (HOSPITAL_COMMUNITY): Payer: Medicare Other

## 2022-03-24 ENCOUNTER — Inpatient Hospital Stay (HOSPITAL_COMMUNITY)
Admission: EM | Admit: 2022-03-24 | Discharge: 2022-03-28 | DRG: 065 | Disposition: A | Discharge status: Skilled Nursing Facility | Payer: Medicare Other | Attending: Internal Medicine | Admitting: Internal Medicine

## 2022-03-24 ENCOUNTER — Emergency Department (HOSPITAL_COMMUNITY): Payer: Medicare Other

## 2022-03-24 DIAGNOSIS — Z8546 Personal history of malignant neoplasm of prostate: Secondary | ICD-10-CM

## 2022-03-24 DIAGNOSIS — R29810 Facial weakness: Secondary | ICD-10-CM | POA: Diagnosis present

## 2022-03-24 DIAGNOSIS — G8194 Hemiplegia, unspecified affecting left nondominant side: Secondary | ICD-10-CM | POA: Diagnosis present

## 2022-03-24 DIAGNOSIS — B182 Chronic viral hepatitis C: Secondary | ICD-10-CM | POA: Diagnosis present

## 2022-03-24 DIAGNOSIS — K760 Fatty (change of) liver, not elsewhere classified: Secondary | ICD-10-CM | POA: Diagnosis present

## 2022-03-24 DIAGNOSIS — I69322 Dysarthria following cerebral infarction: Secondary | ICD-10-CM | POA: Diagnosis not present

## 2022-03-24 DIAGNOSIS — R748 Abnormal levels of other serum enzymes: Secondary | ICD-10-CM | POA: Diagnosis present

## 2022-03-24 DIAGNOSIS — I69352 Hemiplegia and hemiparesis following cerebral infarction affecting left dominant side: Secondary | ICD-10-CM | POA: Diagnosis not present

## 2022-03-24 DIAGNOSIS — M6282 Rhabdomyolysis: Secondary | ICD-10-CM | POA: Diagnosis present

## 2022-03-24 DIAGNOSIS — R338 Other retention of urine: Secondary | ICD-10-CM | POA: Diagnosis present

## 2022-03-24 DIAGNOSIS — Z8042 Family history of malignant neoplasm of prostate: Secondary | ICD-10-CM | POA: Diagnosis not present

## 2022-03-24 DIAGNOSIS — I10 Essential (primary) hypertension: Secondary | ICD-10-CM | POA: Diagnosis present

## 2022-03-24 DIAGNOSIS — R1312 Dysphagia, oropharyngeal phase: Secondary | ICD-10-CM | POA: Diagnosis present

## 2022-03-24 DIAGNOSIS — F17210 Nicotine dependence, cigarettes, uncomplicated: Secondary | ICD-10-CM | POA: Diagnosis present

## 2022-03-24 DIAGNOSIS — I69391 Dysphagia following cerebral infarction: Secondary | ICD-10-CM

## 2022-03-24 DIAGNOSIS — N401 Enlarged prostate with lower urinary tract symptoms: Secondary | ICD-10-CM | POA: Diagnosis present

## 2022-03-24 DIAGNOSIS — N179 Acute kidney failure, unspecified: Secondary | ICD-10-CM | POA: Diagnosis present

## 2022-03-24 DIAGNOSIS — Z0189 Encounter for other specified special examinations: Secondary | ICD-10-CM

## 2022-03-24 DIAGNOSIS — Z79899 Other long term (current) drug therapy: Secondary | ICD-10-CM

## 2022-03-24 DIAGNOSIS — J45909 Unspecified asthma, uncomplicated: Secondary | ICD-10-CM | POA: Diagnosis present

## 2022-03-24 DIAGNOSIS — E785 Hyperlipidemia, unspecified: Secondary | ICD-10-CM | POA: Diagnosis present

## 2022-03-24 DIAGNOSIS — Z7902 Long term (current) use of antithrombotics/antiplatelets: Secondary | ICD-10-CM | POA: Diagnosis not present

## 2022-03-24 DIAGNOSIS — Z8249 Family history of ischemic heart disease and other diseases of the circulatory system: Secondary | ICD-10-CM | POA: Diagnosis not present

## 2022-03-24 DIAGNOSIS — F17200 Nicotine dependence, unspecified, uncomplicated: Secondary | ICD-10-CM | POA: Diagnosis not present

## 2022-03-24 DIAGNOSIS — I639 Cerebral infarction, unspecified: Secondary | ICD-10-CM | POA: Diagnosis present

## 2022-03-24 DIAGNOSIS — N319 Neuromuscular dysfunction of bladder, unspecified: Secondary | ICD-10-CM | POA: Diagnosis present

## 2022-03-24 DIAGNOSIS — Z923 Personal history of irradiation: Secondary | ICD-10-CM | POA: Diagnosis not present

## 2022-03-24 DIAGNOSIS — I63531 Cerebral infarction due to unspecified occlusion or stenosis of right posterior cerebral artery: Secondary | ICD-10-CM | POA: Diagnosis present

## 2022-03-24 DIAGNOSIS — R29713 NIHSS score 13: Secondary | ICD-10-CM | POA: Diagnosis present

## 2022-03-24 DIAGNOSIS — Z7982 Long term (current) use of aspirin: Secondary | ICD-10-CM | POA: Diagnosis not present

## 2022-03-24 LAB — RAPID URINE DRUG SCREEN, HOSP PERFORMED
Amphetamines: NOT DETECTED
Barbiturates: NOT DETECTED
Benzodiazepines: NOT DETECTED
Cocaine: NOT DETECTED
Opiates: NOT DETECTED
Tetrahydrocannabinol: NOT DETECTED

## 2022-03-24 LAB — DIFFERENTIAL
Abs Immature Granulocytes: 0.02 10*3/uL (ref 0.00–0.07)
Basophils Absolute: 0 10*3/uL (ref 0.0–0.1)
Basophils Relative: 0 %
Eosinophils Absolute: 0.1 10*3/uL (ref 0.0–0.5)
Eosinophils Relative: 1 %
Immature Granulocytes: 0 %
Lymphocytes Relative: 11 %
Lymphs Abs: 0.9 10*3/uL (ref 0.7–4.0)
Monocytes Absolute: 0.5 10*3/uL (ref 0.1–1.0)
Monocytes Relative: 7 %
Neutro Abs: 6.3 10*3/uL (ref 1.7–7.7)
Neutrophils Relative %: 81 %

## 2022-03-24 LAB — COMPREHENSIVE METABOLIC PANEL
ALT: 96 U/L — ABNORMAL HIGH (ref 0–44)
AST: 201 U/L — ABNORMAL HIGH (ref 15–41)
Albumin: 4.1 g/dL (ref 3.5–5.0)
Alkaline Phosphatase: 44 U/L (ref 38–126)
Anion gap: 10 (ref 5–15)
BUN: 35 mg/dL — ABNORMAL HIGH (ref 8–23)
CO2: 23 mmol/L (ref 22–32)
Calcium: 9.8 mg/dL (ref 8.9–10.3)
Chloride: 113 mmol/L — ABNORMAL HIGH (ref 98–111)
Creatinine, Ser: 1.25 mg/dL — ABNORMAL HIGH (ref 0.61–1.24)
GFR, Estimated: >60 mL/min (ref >60)
Glucose, Bld: 118 mg/dL — ABNORMAL HIGH (ref 70–99)
Potassium: 3.8 mmol/L (ref 3.5–5.1)
Sodium: 146 mmol/L — ABNORMAL HIGH (ref 135–145)
Total Bilirubin: 0.8 mg/dL (ref 0.3–1.2)
Total Protein: 7.7 g/dL (ref 6.5–8.1)

## 2022-03-24 LAB — URINALYSIS, ROUTINE W REFLEX MICROSCOPIC
Bilirubin Urine: NEGATIVE
Glucose, UA: NEGATIVE mg/dL
Ketones, ur: 20 mg/dL — AB
Nitrite: NEGATIVE
Protein, ur: 100 mg/dL — AB
Specific Gravity, Urine: 1.024 (ref 1.005–1.030)
pH: 5 (ref 5.0–8.0)

## 2022-03-24 LAB — I-STAT CHEM 8, ED
BUN: 41 mg/dL — ABNORMAL HIGH (ref 8–23)
Calcium, Ion: 1.19 mmol/L (ref 1.15–1.40)
Chloride: 116 mmol/L — ABNORMAL HIGH (ref 98–111)
Creatinine, Ser: 1.1 mg/dL (ref 0.61–1.24)
Glucose, Bld: 114 mg/dL — ABNORMAL HIGH (ref 70–99)
HCT: 43 % (ref 39.0–52.0)
Hemoglobin: 14.6 g/dL (ref 13.0–17.0)
Potassium: 4 mmol/L (ref 3.5–5.1)
Sodium: 150 mmol/L — ABNORMAL HIGH (ref 135–145)
TCO2: 25 mmol/L (ref 22–32)

## 2022-03-24 LAB — CBC
HCT: 43 % (ref 39.0–52.0)
Hemoglobin: 14.3 g/dL (ref 13.0–17.0)
MCH: 30.6 pg (ref 26.0–34.0)
MCHC: 33.3 g/dL (ref 30.0–36.0)
MCV: 91.9 fL (ref 80.0–100.0)
Platelets: 287 10*3/uL (ref 150–400)
RBC: 4.68 MIL/uL (ref 4.22–5.81)
RDW: 14.6 % (ref 11.5–15.5)
WBC: 7.8 10*3/uL (ref 4.0–10.5)
nRBC: 0 % (ref 0.0–0.2)

## 2022-03-24 LAB — PROTIME-INR
INR: 1.2 (ref 0.8–1.2)
Prothrombin Time: 14.6 seconds (ref 11.4–15.2)

## 2022-03-24 LAB — APTT: aPTT: 26 seconds (ref 24–36)

## 2022-03-24 LAB — CK: Total CK: 8094 U/L — ABNORMAL HIGH (ref 49–397)

## 2022-03-24 LAB — ETHANOL: Alcohol, Ethyl (B): <10 mg/dL (ref <10)

## 2022-03-24 MED ORDER — ENOXAPARIN SODIUM 30 MG/0.3ML IJ SOSY
30.0000 mg | PREFILLED_SYRINGE | INTRAMUSCULAR | Status: DC
  Administered 2022-03-24 – 2022-03-28 (×5): 30 mg via SUBCUTANEOUS
  Filled 2022-03-24 (×5): qty 0.3

## 2022-03-24 MED ORDER — LORAZEPAM 2 MG/ML IJ SOLN
0.5000 mg | Freq: Once | INTRAMUSCULAR | Status: AC
  Administered 2022-03-24: 0.5 mg via INTRAVENOUS
  Filled 2022-03-24: qty 1

## 2022-03-24 MED ORDER — ACETAMINOPHEN 650 MG RE SUPP: 650.0000 mg | Freq: Four times a day (QID) | RECTAL | Status: DC | PRN

## 2022-03-24 MED ORDER — ACETAMINOPHEN 325 MG PO TABS
650.0000 mg | ORAL_TABLET | Freq: Four times a day (QID) | ORAL | Status: DC | PRN
  Filled 2022-03-24: qty 2

## 2022-03-24 MED ORDER — LACTATED RINGERS IV BOLUS
1000.0000 mL | Freq: Once | INTRAVENOUS | Status: AC
  Administered 2022-03-25: 1000 mL via INTRAVENOUS

## 2022-03-24 MED ORDER — TAMSULOSIN HCL 0.4 MG PO CAPS
0.4000 mg | ORAL_CAPSULE | Freq: Every day | ORAL | Status: DC
  Administered 2022-03-26: 0.4 mg via ORAL
  Filled 2022-03-24: qty 1

## 2022-03-24 MED ORDER — SODIUM CHLORIDE 0.9 % IV BOLUS
500.0000 mL | Freq: Once | INTRAVENOUS | Status: AC
  Administered 2022-03-24: 500 mL via INTRAVENOUS

## 2022-03-24 MED ORDER — SODIUM CHLORIDE 0.9 % IV BOLUS
1000.0000 mL | Freq: Once | INTRAVENOUS | Status: AC
  Administered 2022-03-24: 1000 mL via INTRAVENOUS

## 2022-03-24 MED ORDER — LACTATED RINGERS IV BOLUS
1000.0000 mL | Freq: Once | INTRAVENOUS | Status: AC
  Administered 2022-03-24: 1000 mL via INTRAVENOUS

## 2022-03-24 MED ORDER — ASPIRIN 325 MG PO TABS: 325.0000 mg | ORAL_TABLET | Freq: Every day | ORAL | Status: DC

## 2022-03-24 MED ORDER — LACTATED RINGERS IV SOLN: INTRAVENOUS | Status: AC

## 2022-03-24 NOTE — ED Provider Notes (Signed)
[facility] EMERGENCY DEPARTMENT Provider Note   CSN: [id number] Arrival date & time: 03/24/22  1450     History  Chief Complaint  Patient presents with   Fall   Extremity Weakness    Jason Jones is a 71 y.o. male.  71 year old male brought in by EMS from home where reportedly he had a stroke 2 days ago and has been laying on the floor, was able to crawl to the kitchen and pound on the floor where his neighbors heard him and called for help.  Denies cardiac history, is not anticoagulated, denies any chest pain, shortness of breath, belly pain or injuries from his fall.  Found to have left-sided deficits with decreased sensation to left arm and leg, left arm and leg weakness, visual disturbance affecting right eye, difficulty with speech.       Home Medications Prior to Admission medications   Medication Sig Start Date End Date Taking? Authorizing Provider  amLODipine (NORVASC) 2.5 MG tablet Take 2.5 mg by mouth daily. 02/11/21   [provider]  meloxicam (MOBIC) 15 MG tablet Take 15 mg by mouth daily. 02/11/21   [provider]  tamsulosin (FLOMAX) 0.4 MG CAPS capsule Take 0.4 mg by mouth daily. 01/30/21   [provider]      Allergies    No known allergies    Review of Systems   Review of Systems Level 5 caveat for difficulty with speech Physical Exam Updated Vital Signs BP 137/72 (BP Location: Right Arm)   Pulse 70   Resp 20   SpO2 95%  Physical Exam Vitals and nursing note reviewed.  Constitutional:      General: He is not in acute distress.    Appearance: He is well-developed. He is not diaphoretic.  HENT:     Head: Normocephalic and atraumatic.     Mouth/Throat:     Mouth: Mucous membranes are dry.  Cardiovascular:     Rate and Rhythm: Normal rate and regular rhythm.     Heart sounds: Normal heart sounds.  Pulmonary:     Effort: Pulmonary effort is normal.     Breath sounds: Normal breath sounds.  Abdominal:      Palpations: Abdomen is soft.     Tenderness: There is no abdominal tenderness.  Musculoskeletal:        General: No swelling or deformity.     Cervical back: Neck supple.  Skin:    General: Skin is warm and dry.     Findings: No erythema or rash.  Neurological:     Mental Status: He is alert.     Cranial Nerves: Cranial nerve deficit present.     Sensory: Sensory deficit present.     Motor: Weakness present.     Coordination: Coordination abnormal.     Comments: Left arm and leg weakness (significant- falls to gravity) with reported decreased sensation, facial asymmetry,   Psychiatric:        Behavior: Behavior normal.     ED Results / Procedures / Treatments   Labs (all labs ordered are listed, but only abnormal results are displayed) Labs Reviewed  COMPREHENSIVE METABOLIC PANEL - Abnormal; Notable for the following components:      Result Value   Sodium 146 (*)    Chloride 113 (*)    Glucose, Bld 118 (*)    BUN 35 (*)    Creatinine, Ser 1.25 (*)    AST 201 (*)    ALT 96 (*)  All other components within normal limits  CK - Abnormal; Notable for the following components:   Total CK 8,094 (*)    All other components within normal limits  I-STAT CHEM 8, ED - Abnormal; Notable for the following components:   Sodium 150 (*)    Chloride 116 (*)    BUN 41 (*)    Glucose, Bld 114 (*)    All other components within normal limits  ETHANOL  PROTIME-INR  APTT  CBC  DIFFERENTIAL  RAPID URINE DRUG SCREEN, HOSP PERFORMED  URINALYSIS, ROUTINE W REFLEX MICROSCOPIC  TROPONIN I (HIGH SENSITIVITY)  TROPONIN I (HIGH SENSITIVITY)    EKG EKG Interpretation  Date/Time:  Sunday March 24 2022 16:33:04 EDT Ventricular Rate:  61 PR Interval:  124 QRS Duration: 86 QT Interval:  436 QTC Calculation: 438 R Axis:   70 Text Interpretation: Normal sinus rhythm Nonspecific T wave abnormality  T wave changes new since 2022 Confirmed by Sherwood Gambler [phone]) on 03/24/2022  4:38:30 PM  Radiology CT HEAD WO CONTRAST  Result Date: 03/24/2022 CLINICAL DATA:  Found down, stroke 2 days ago EXAM: CT HEAD WITHOUT CONTRAST TECHNIQUE: Contiguous axial images were obtained from the base of the skull through the vertex without intravenous contrast. RADIATION DOSE REDUCTION: This exam was performed according to the departmental dose-optimization program which includes automated exposure control, adjustment of the mA and/or kV according to patient size and/or use of iterative reconstruction technique. COMPARISON:  01/18/2012 FINDINGS: Brain: No evidence of hemorrhage, hydrocephalus, extra-axial collection or mass lesion/mass effect. Hypodensity in the medial right occipital lobe, reflecting a subacute right PCA distribution infarct (series 3/image 14), concordant with the clinical history. Vascular: Intracranial atherosclerosis. Skull: Normal. Negative for fracture or focal lesion. Sinuses/Orbits: The visualized paranasal sinuses are essentially clear. The mastoid air cells are unopacified. Other: None. IMPRESSION: Subacute right PCA distribution infarct, concordant with the clinical history. No evidence of hemorrhagic transformation. Electronically Signed   By: Julian Hy M.D.   On: 03/24/2022 17:54    Procedures .Critical Care  Performed by: Tacy Learn, PA-C Authorized by: Tacy Learn, PA-C   Critical care provider statement:    Critical care time (minutes):  30   Critical care was time spent personally by me on the following activities:  Development of treatment plan with patient or surrogate, discussions with consultants, evaluation of patient's response to treatment, examination of patient, ordering and review of laboratory studies, ordering and review of radiographic studies, ordering and performing treatments and interventions, pulse oximetry, re-evaluation of patient's condition and review of old charts     Medications Ordered in ED Medications  sodium  chloride 0.9 % bolus 500 mL (0 mLs Intravenous Stopped 03/24/22 1733)  sodium chloride 0.9 % bolus 1,000 mL (1,000 mLs Intravenous New Bag/Given 03/24/22 1726)    ED Course/ Medical Decision Making/ A&P                           Medical Decision Making Amount and/or Complexity of Data Reviewed Labs: ordered. Radiology: ordered.  Risk Decision regarding hospitalization.   This patient presents to the ED for concern of left-sided weakness after fall 2 days ago, this involves an extensive number of treatment options, and is a complaint that carries with it a high risk of complications and morbidity.  The differential diagnosis includes but not limited to CVA, metabolic derangement, rhabdomyolysis   Co morbidities that complicate the patient evaluation  Hypertension, prior alcohol  use, quit drinking 3 years ago   Additional history obtained:  Additional history obtained from EMS who notes patient fell 2 days ago, had left-sided weakness that provided him from getting up to walk, was able to crawl to the kitchen where he pounded on the floor to get neighbors attention and call for help External records from outside source obtained and reviewed including prior note from podiatry visit dated 01/11/2022 for ingrown toenail.  At that visit, case was discussed with patient's wife, does not have wife on file in his demographics.   Lab Tests:  I Ordered, and personally interpreted labs.  The pertinent results include: CBC unremarkable.  CMP with sodium of 146, creatinine mildly elevated at 1.25.  Elevated AST at 201, ALT of 96.  INR normal, alcohol negative.  CK elevated at 8,094   Imaging Studies ordered:  I ordered imaging studies including CT head I independently visualized and interpreted imaging which showed subacute right PCA infarct I agree with the radiologist interpretation   Cardiac Monitoring: / EKG:  The patient was maintained on a cardiac monitor.  I personally viewed and  interpreted the cardiac monitored which showed an underlying rhythm of: Normal sinus rhythm, rate 61, T wave changes, add on troponin   Consultations Obtained:  I requested consultation with the neuro hospitalist, Dr. Curly Shores,  and discussed lab and imaging findings as well as pertinent plan - they recommend: Neuro will see, request hospitalist to admit Discussed with internal medicine teaching service who will consult for admission   Problem List / ED Course / Critical interventions / Medication management  71 year old male brought in by EMS from patient's apartment.  Patient states that he had a stroke 2 days ago, fell and was unable to call for help due to profound left-sided weakness.  Patient was able to crawl along the floor today to the kitchen area where he banged on the floor, got his neighbor's attention and EMS was contacted.  He is found to have significant left arm and leg weakness.  He reports altered sensation in his left arm and leg.  He has some left-sided facial weakness as well and reports diminished vision out of his right eye.  Reports prior alcohol use, states he has not had any alcohol in 3 years, no history of seizures related to alcohol use.  He is found to have an acute right PCA infarct, I discussed with neuro hospitalist Dr. Curly Shores who will see the patient, request hospitalist admit.  Patient is also found to be in rhabdo with CK of 8,000, treated with IV fluids.  Discussed with internal medicine service who will consult for admission. I ordered medication including IV fluids for dehydration/rhabdomyolysis Reevaluation of the patient after these medicines showed that the patient stayed the same I have reviewed the patients home medicines and have made adjustments as needed   Social Determinants of Health:  Lives alone, has out of network PCP   Test / Admission - Considered:  Admit for further management         Final Clinical Impression(s) / ED  Diagnoses Final diagnoses:  Non-traumatic rhabdomyolysis  Cerebrovascular accident (CVA), unspecified mechanism ([place])    Rx / DC Orders ED Discharge Orders     None         Tacy Learn, PA-C 03/24/22 1819    Sherwood Gambler, MD 03/26/22 [phone]

## 2022-03-24 NOTE — Consult Note (Signed)
NEUROLOGY CONSULTATION NOTE   Date of service: March 24, 2022 Patient Name: Jason Jones MRN:  [id number] DOB:  [birth year] Reason for consult: "R PCA stroke with L sided weakness" Requesting Provider: Aldine Contes, MD _ _ _   _ __   _ __ _ _  __ __   _ __   __ _  History of Present Illness  Jason Jones is a 71 y.o. male with PMH significant for alcohol use, GERD, hypertension hepatitis C, smoker who was brought in by EMS for being down on the floor at his home for the last 2 days.  Patient reports that on Friday 10/13, he had sudden onset left-sided weakness and slid out of his couch and onto the floor.  For the last 2 days has been banging on the wall and his neighbors eventually heard him and called 911.  He was noted to be weak on the left side.  CT head demonstrated a subacute right PCA infarct.  He received Ativan before MRI and is somnolent and somewhat difficult to get history out of.  LKW: 03/22/2022. mRS: 0 tNKASE: Not offered he is outside the window. Thrombectomy: Not on for days outside the window. NIHSS components Score: Comment  1a Level of Conscious 0'[]'$  1'[x]'$  2'[]'$  3'[]'$      1b LOC Questions 0'[]'$  1'[]'$  2'[x]'$       1c LOC Commands 0'[x]'$  1'[]'$  2'[]'$       2 Best Gaze 0'[x]'$  1'[]'$  2'[]'$       3 Visual 0'[]'$  1'[]'$  2'[x]'$  3'[]'$      4 Facial Palsy 0'[]'$  1'[x]'$  2'[]'$  3'[]'$      5a Motor Arm - left 0'[]'$  1'[]'$  2'[]'$  3'[x]'$  4'[]'$  UN'[]'$    5b Motor Arm - Right 0'[x]'$  1'[]'$  2'[]'$  3'[]'$  4'[]'$  UN'[]'$    6a Motor Leg - Left 0'[]'$  1'[]'$  2'[]'$  3'[x]'$  4'[]'$  UN'[]'$    6b Motor Leg - Right 0'[x]'$  1'[]'$  2'[]'$  3'[]'$  4'[]'$  UN'[]'$    7 Limb Ataxia 0'[x]'$  1'[]'$  2'[]'$  3'[]'$  UN'[]'$     8 Sensory 0'[x]'$  1'[]'$  2'[]'$  UN'[]'$      9 Best Language 0'[x]'$  1'[]'$  2'[]'$  3'[]'$      10 Dysarthria 0'[]'$  1'[x]'$  2'[]'$  UN'[]'$      11 Extinct. and Inattention 0'[x]'$  1'[]'$  2'[]'$       TOTAL: 13       ROS   Constitutional Denies weight loss, fever and chills.   HEENT + changes in vision with intact hearing.   Respiratory Denies SOB and cough.   CV Denies palpitations and CP   GI Denies abdominal pain, nausea, vomiting and  diarrhea.   GU Denies dysuria and urinary frequency.   MSK Denies myalgia and joint pain.   Skin Denies rash and pruritus.   Neurological Denies headache and syncope.  Psychiatric Denies recent changes in mood. Denies anxiety and depression.   Past History   Past Medical History:  Diagnosis Date   Arthritis    Asthma    Cancer ([place])    Chronic alcohol abuse    Dyspnea    GERD (gastroesophageal reflux disease)    PRN  ---  TAKES BAKING SODA IN WATER   History of pneumothorax    04-28-2006  fell, left fx rib--  resolved w/ chest tube   Hypertension    Nocturia    Poor dental hygiene    Right inguinal hernia    Weak urinary stream    Past Surgical History:  Procedure Laterality Date   COLONOSCOPY  01/08/2021   GOLD SEED IMPLANT N/A 05/23/2021   Procedure: GOLD  SEED IMPLANT;  Surgeon: Ceasar Mons, MD;  Location: [facility];  Service: Urology;  Laterality: N/A;   INGUINAL HERNIA REPAIR Right 04/23/2017   Procedure: OPEN RIGHT INGUINAL HERNIA REPAIR WITH MESH;  Surgeon: Kinsinger, Arta Bruce, MD;  Location: [place];  Service: General;  Laterality: Right;  GENERAL COMBINED WITH REGIONAL FOR POST OP PAIN    INSERTION OF MESH Right 04/23/2017   Procedure: INSERTION OF MESH;  Surgeon: Kinsinger, Arta Bruce, MD;  Location: [place];  Service: General;  Laterality: Right;  GENERAL COMBINED WITH REGIONAL FOR POST OP PAIN    LIPOMA EXCISION Right 02/22/2015   Procedure: RIGHT SHOULDER MASS EXCISION 15 CM SQ;  Surgeon: Mickeal Skinner, MD;  Location: [facility];  Service: General;  Laterality: Right;   SPACE OAR INSTILLATION N/A 05/23/2021   Procedure: SPACE OAR INSTILLATION;  Surgeon: Ceasar Mons, MD;  Location: [facility];  Service: Urology;  Laterality: N/A;   TRANSRECTAL ULTRASOUND N/A 05/23/2021   Procedure: TRANSRECTAL ULTRASOUND;  Surgeon: Ceasar Mons, MD;  Location: [facility];  Service: Urology;   Laterality: N/A;   Family History  Problem Relation Age of Onset   Prostate cancer Cousin    Prostate cancer Cousin    Social History   Socioeconomic History   Marital status: Divorced    Spouse name: Not on file   Number of children: Not on file   Years of education: Not on file   Highest education level: Not on file  Occupational History   Not on file  Tobacco Use   Smoking status: Every Day    Packs/day: 0.50    Years: 50.00    Total pack years: 25.00    Types: Cigarettes   Smokeless tobacco: Never  Vaping Use   Vaping Use: Never used  Substance and Sexual Activity   Alcohol use: Not Currently    Comment: last use 2-3 months ago ,   Drug use: Not Currently    Types: Marijuana    Comment: last use 2 years ago per pt   Sexual activity: Not on file  Other Topics Concern   Not on file  Social History Narrative   Not on file   Social Determinants of Health   Financial Resource Strain: Not on file  Food Insecurity: Not on file  Transportation Needs: Not on file  Physical Activity: Not on file  Stress: Not on file  Social Connections: Not on file   Allergies  Allergen Reactions   No Known Allergies     Medications  (Not in a hospital admission)    Vitals   Vitals:   03/24/22 1450 03/24/22 1500 03/24/22 1614 03/24/22 2029  BP:   137/72   Pulse:   70   Resp:   20   Temp:    98.5 F (36.9 C)  TempSrc:    Oral  SpO2: 96% 100% 95%      There is no height or weight on file to calculate BMI.  Physical Exam   General: Laying comfortably in bed; in no acute distress.  HENT: Normal oropharynx and mucosa. Normal external appearance of ears and nose.  Neck: Supple, no pain or tenderness  CV: No JVD. No peripheral edema.  Pulmonary: Symmetric Chest rise. Normal respiratory effort.  Abdomen: Soft to touch, non-tender.  Ext: No cyanosis, edema, or deformity  Skin: No rash. Normal palpation of skin.   Musculoskeletal: Normal digits and nails by inspection.  No  clubbing.   Neurologic Examination  Mental status/Cognition: Somnolent with poor attention.  Opens eyes to vigorous tactile stimulation and will stay awake for a few seconds before dozing off again.  oriented to self, place, thinks this is July 1920.   Speech/language: Limited due to somnolence.  Nonfluent, dysarthric, comprehension intact to simple commands.  Able to name a few objects.  Able to repeat.   Cranial nerves:   CN II Pupils equal and reactive to light, left hemianopsia.   CN III,IV,VI EOM intact, no gaze preference or deviation, no nystagmus    CN V normal sensation in V1, V2, and V3 segments bilaterally    CN VII Mild left facial droop.   CN VIII normal hearing to speech   CN IX & X normal palatal elevation, no uvular deviation. Mouth dry   CN XI 5/5 head turn and 5/5 shoulder shrug bilaterally   CN XII midline tongue protrusion   Motor:  Muscle bulk: poor, tone flaccid in LUE  Mvmt Root Nerve  Muscle Right Left Comments  SA C5/6 Ax Deltoid 4+ 1   EF C5/6 Mc Biceps 5 0   EE C6/7/8 Rad Triceps 5 0   WF C6/7 Med FCR     WE C7/8 PIN ECU     F Ab C8/T1 U ADM/FDI 5 0   HF L1/2/3 Fem Illopsoas 5 1   KE L2/3/4 Fem Quad 5 0   DF L4/5 D Peron Tib Ant 5 0   PF S1/2 Tibial Grc/Sol 5 0    Sensation:  Light touch Grossly intact to light touch.   Pin prick    Temperature    Vibration   Proprioception    Coordination/Complex Motor:  - Finger to Nose intact in the right upper extremity.  Unable to do the left upper extremity. - Heel to shin unable to do - Rapid alternating movement unable to do - Gait: Deferred for patient's safety. Labs   CBC:  Recent Labs  Lab 03/24/22 1521 03/24/22 1550  WBC 7.8  --   NEUTROABS 6.3  --   HGB 14.3 14.6  HCT 43.0 43.0  MCV 91.9  --   PLT 287  --     Basic Metabolic Panel:  Lab Results  Component Value Date   NA 150 (H) 03/24/2022   K 4.0 03/24/2022   CO2 23 03/24/2022   GLUCOSE 114 (H) 03/24/2022   BUN 41 (H)  03/24/2022   CREATININE 1.10 03/24/2022   CALCIUM 9.8 03/24/2022   GFRNONAA >60 03/24/2022   GFRAA >60 04/21/2017   Lipid Panel: No results found for: "LDLCALC" HgbA1c: No results found for: "HGBA1C" Urine Drug Screen:     Component Value Date/Time   LABOPIA NONE DETECTED 03/24/2022 1510   COCAINSCRNUR NONE DETECTED 03/24/2022 1510   LABBENZ NONE DETECTED 03/24/2022 1510   AMPHETMU NONE DETECTED 03/24/2022 1510   THCU NONE DETECTED 03/24/2022 1510   LABBARB NONE DETECTED 03/24/2022 1510    Alcohol Level     Component Value Date/Time   ETH <10 03/24/2022 1521    CT Head without contrast(Personally reviewed): Subacute right PCA territory infarct.  MR Angio head without contrast and MR angio neck with without contrast (Personally reviewed): Right PCA P1 occlusion.  PCA does not appear to be a fetal origin.  MRI Brain(Personally reviewed): R PCA stroke  Impression   Garner Dullea is a 71 y.o. male with PMH significant for with PMH significant for alcohol use, GERD, hypertension hepatitis C, smoker  who was brought in by EMS for being down on the floor at his home for the last 2 days with left hemianopsia and left-sided weakness.  He was found to have a large right PCA infarct with right PCA P1 occlusion.  Etiology of his stroke is either large artery atherosclerosis versus embolic.  Recommendations   - Frequent Neuro checks per stroke unit protocol - Recommend obtaining TTE  - Recommend obtaining Lipid panel with LDL - Please start statin if LDL > 70 - Recommend HbA1c - Antithrombotic -aspirin 81 mg daily along with Plavix 75 mg daily for 21 days, followed by aspirin 81 mg daily alone. However, failed swallow so ordered Aspirin '300mg'$  rectal daily. - Recommend DVT ppx - SBP goal - permissive hypertension first 24 h < 220/110. Held home meds.  - Recommend Telemetry monitoring for arrythmia - Recommend bedside swallow screen prior to PO intake. - Stroke education  booklet - Recommend PT/OT/SLP consult - Recommend Urine Tox screen.  ______________________________________________________________________   Thank you for the opportunity to take part in the care of this patient. If you have any further questions, please contact the neurology consultation attending.  Signed,  [place] Pager Number [phone] _ _ _   _ __   _ __ _ _  __ __   _ __   __ _

## 2022-03-24 NOTE — ED Triage Notes (Signed)
Pt BIB [facility]. Pt found by neighbor in floor in kitchen. Pt reports "I had a stroke two days ago and had to crawl to the kitchen to get help."

## 2022-03-24 NOTE — ED Notes (Signed)
Patient transported to MRI 

## 2022-03-25 ENCOUNTER — Inpatient Hospital Stay (HOSPITAL_COMMUNITY): Payer: Medicare Other

## 2022-03-25 ENCOUNTER — Encounter (HOSPITAL_COMMUNITY): Payer: Medicare Other

## 2022-03-25 DIAGNOSIS — I63531 Cerebral infarction due to unspecified occlusion or stenosis of right posterior cerebral artery: Secondary | ICD-10-CM | POA: Diagnosis present

## 2022-03-25 DIAGNOSIS — F17210 Nicotine dependence, cigarettes, uncomplicated: Secondary | ICD-10-CM

## 2022-03-25 DIAGNOSIS — I69391 Dysphagia following cerebral infarction: Secondary | ICD-10-CM

## 2022-03-25 DIAGNOSIS — M6282 Rhabdomyolysis: Secondary | ICD-10-CM | POA: Diagnosis present

## 2022-03-25 DIAGNOSIS — F17200 Nicotine dependence, unspecified, uncomplicated: Secondary | ICD-10-CM

## 2022-03-25 DIAGNOSIS — N179 Acute kidney failure, unspecified: Secondary | ICD-10-CM | POA: Diagnosis present

## 2022-03-25 DIAGNOSIS — R748 Abnormal levels of other serum enzymes: Secondary | ICD-10-CM | POA: Diagnosis present

## 2022-03-25 DIAGNOSIS — I10 Essential (primary) hypertension: Secondary | ICD-10-CM | POA: Diagnosis present

## 2022-03-25 HISTORY — DX: Cerebral infarction due to unspecified occlusion or stenosis of right posterior cerebral artery: I63.531

## 2022-03-25 LAB — CBC
HCT: 40.4 % (ref 39.0–52.0)
Hemoglobin: 12.8 g/dL — ABNORMAL LOW (ref 13.0–17.0)
MCH: 29.8 pg (ref 26.0–34.0)
MCHC: 31.7 g/dL (ref 30.0–36.0)
MCV: 94.2 fL (ref 80.0–100.0)
Platelets: 255 10*3/uL (ref 150–400)
RBC: 4.29 MIL/uL (ref 4.22–5.81)
RDW: 14.6 % (ref 11.5–15.5)
WBC: 7 10*3/uL (ref 4.0–10.5)
nRBC: 0 % (ref 0.0–0.2)

## 2022-03-25 LAB — URINALYSIS, COMPLETE (UACMP) WITH MICROSCOPIC
Bilirubin Urine: NEGATIVE
Glucose, UA: NEGATIVE mg/dL
Ketones, ur: 5 mg/dL — AB
Nitrite: NEGATIVE
Protein, ur: 100 mg/dL — AB
Specific Gravity, Urine: 1.023 (ref 1.005–1.030)
pH: 6 (ref 5.0–8.0)

## 2022-03-25 LAB — LIPID PANEL
Cholesterol: 130 mg/dL (ref 0–200)
HDL: 29 mg/dL — ABNORMAL LOW (ref >40)
LDL Cholesterol: 84 mg/dL (ref 0–99)
Total CHOL/HDL Ratio: 4.5 RATIO
Triglycerides: 83 mg/dL (ref <150)
VLDL: 17 mg/dL (ref 0–40)

## 2022-03-25 LAB — ECHOCARDIOGRAM COMPLETE
Area-P 1/2: 5.66 cm2
S' Lateral: 3.1 cm

## 2022-03-25 LAB — CK
Total CK: 4695 U/L — ABNORMAL HIGH (ref 49–397)
Total CK: 3695 U/L — ABNORMAL HIGH (ref 49–397)

## 2022-03-25 LAB — TSH: TSH: 0.299 u[IU]/mL — ABNORMAL LOW (ref 0.350–4.500)

## 2022-03-25 LAB — COMPREHENSIVE METABOLIC PANEL
ALT: 70 U/L — ABNORMAL HIGH (ref 0–44)
AST: 123 U/L — ABNORMAL HIGH (ref 15–41)
Albumin: 3 g/dL — ABNORMAL LOW (ref 3.5–5.0)
Alkaline Phosphatase: 35 U/L — ABNORMAL LOW (ref 38–126)
Anion gap: 9 (ref 5–15)
BUN: 26 mg/dL — ABNORMAL HIGH (ref 8–23)
CO2: 21 mmol/L — ABNORMAL LOW (ref 22–32)
Calcium: 8.6 mg/dL — ABNORMAL LOW (ref 8.9–10.3)
Chloride: 115 mmol/L — ABNORMAL HIGH (ref 98–111)
Creatinine, Ser: 0.9 mg/dL (ref 0.61–1.24)
GFR, Estimated: >60 mL/min (ref >60)
Glucose, Bld: 101 mg/dL — ABNORMAL HIGH (ref 70–99)
Potassium: 4.3 mmol/L (ref 3.5–5.1)
Sodium: 145 mmol/L (ref 135–145)
Total Bilirubin: 1 mg/dL (ref 0.3–1.2)
Total Protein: 5.9 g/dL — ABNORMAL LOW (ref 6.5–8.1)

## 2022-03-25 LAB — HEMOGLOBIN A1C
Hgb A1c MFr Bld: 6 % — ABNORMAL HIGH (ref 4.8–5.6)
Mean Plasma Glucose: 125.5 mg/dL

## 2022-03-25 LAB — PHOSPHORUS: Phosphorus: 3.4 mg/dL (ref 2.5–4.6)

## 2022-03-25 LAB — T4, FREE: Free T4: 0.78 ng/dL (ref 0.61–1.12)

## 2022-03-25 LAB — URIC ACID: Uric Acid, Serum: 6.8 mg/dL (ref 3.7–8.6)

## 2022-03-25 LAB — MAGNESIUM: Magnesium: 2.3 mg/dL (ref 1.7–2.4)

## 2022-03-25 LAB — TROPONIN I (HIGH SENSITIVITY): Troponin I (High Sensitivity): 12 ng/L (ref <18)

## 2022-03-25 MED ORDER — AMLODIPINE BESYLATE 5 MG PO TABS
5.0000 mg | ORAL_TABLET | Freq: Every day | ORAL | Status: DC
  Administered 2022-03-26 – 2022-03-28 (×3): 5 mg via ORAL
  Filled 2022-03-25 (×3): qty 1

## 2022-03-25 MED ORDER — FOOD THICKENER (SIMPLYTHICK): 1.0000 | ORAL | Status: DC | PRN

## 2022-03-25 MED ORDER — ROSUVASTATIN CALCIUM 20 MG PO TABS: 20.0000 mg | ORAL_TABLET | Freq: Every day | ORAL | Status: DC

## 2022-03-25 MED ORDER — STROKE: EARLY STAGES OF RECOVERY BOOK
Freq: Once | Status: AC
  Filled 2022-03-25: qty 1

## 2022-03-25 MED ORDER — ASPIRIN 300 MG RE SUPP
300.0000 mg | Freq: Every day | RECTAL | Status: DC
  Administered 2022-03-25: 300 mg via RECTAL
  Filled 2022-03-25: qty 1

## 2022-03-25 MED ORDER — ALBUTEROL SULFATE HFA 108 (90 BASE) MCG/ACT IN AERS: 2.0000 | INHALATION_SPRAY | RESPIRATORY_TRACT | Status: DC | PRN

## 2022-03-25 MED ORDER — ALBUTEROL SULFATE (2.5 MG/3ML) 0.083% IN NEBU
2.5000 mg | INHALATION_SOLUTION | RESPIRATORY_TRACT | Status: DC | PRN
  Administered 2022-03-25: 2.5 mg via RESPIRATORY_TRACT
  Filled 2022-03-25: qty 3

## 2022-03-25 MED ORDER — ASPIRIN 81 MG PO CHEW
81.0000 mg | CHEWABLE_TABLET | Freq: Every day | ORAL | Status: DC
  Administered 2022-03-26 – 2022-03-28 (×3): 81 mg via ORAL
  Filled 2022-03-25 (×3): qty 1

## 2022-03-25 MED ORDER — LACTATED RINGERS IV SOLN: INTRAVENOUS | Status: AC

## 2022-03-25 MED ORDER — GADOBUTROL 1 MMOL/ML IV SOLN
6.5000 mL | Freq: Once | INTRAVENOUS | Status: AC | PRN
  Administered 2022-03-25: 6.5 mL via INTRAVENOUS

## 2022-03-25 MED ORDER — CLOPIDOGREL BISULFATE 75 MG PO TABS
75.0000 mg | ORAL_TABLET | Freq: Every day | ORAL | Status: DC
  Administered 2022-03-26 – 2022-03-28 (×3): 75 mg via ORAL
  Filled 2022-03-25 (×3): qty 1

## 2022-03-25 NOTE — Progress Notes (Addendum)
Subjective:   Hospital day 1.  Interval events: Patient agitated due to thirst.  Patient is upset and requesting something to drink. He is aware of the reason for his hospital admission. We discussed his diagnosis of stroke. I was at bedside to watch him eat some ice and take a sip of water, which caused some choking.  Patient reports numbness on his left side. He reports a complete inability to move his left arm or left leg.  Objective:  Vital signs: Blood pressure 129/74, pulse (!) 44, temperature 98.2 F (36.8 C), temperature source Oral, resp. rate 19, SpO2 99 %.  Weight change:   Supplemental O2: 2 L [place]  Physical exam: Constitutional: Laying in bed, tugging at cords and lines, grossly weak on left side. Cardiovascular: Regular rate and rhythm. Pulmonary: Normal work of breathing. Anterior lung fields clear to auscultation. Skin: Warm and dry. Extremities: No lower extremity swelling. Neuro: Alert and oriented. Left sided hemiparesis. Left sided facial droop. Dysarthric speech. Tongue midline.   Intake/Output Summary (Last 24 hours) at 03/25/2022 1442 Last data filed at 03/25/2022 0253 Gross per 24 hour  Intake 3300 ml  Output --  Net 3300 ml    Pertinent Labs:    Latest Ref Rng & Units 03/25/2022    4:13 AM 03/24/2022    3:50 PM 03/24/2022    3:21 PM  CBC  WBC 4.0 - 10.5 K/uL 7.0   7.8   Hemoglobin 13.0 - 17.0 g/dL 12.8  14.6  14.3   Hematocrit 39.0 - 52.0 % 40.4  43.0  43.0   Platelets 150 - 400 K/uL 255   287        Latest Ref Rng & Units 03/25/2022    4:13 AM 03/24/2022    3:50 PM 03/24/2022    3:21 PM  CMP  Glucose 70 - 99 mg/dL 101  114  118   BUN 8 - 23 mg/dL 26  41  35   Creatinine 0.61 - 1.24 mg/dL 0.90  1.10  1.25   Sodium 135 - 145 mmol/L 145  150  146   Potassium 3.5 - 5.1 mmol/L 4.3  4.0  3.8   Chloride 98 - 111 mmol/L 115  116  113   CO2 22 - 32 mmol/L 21   23   Calcium 8.9 - 10.3 mg/dL 8.6   9.8   Total Protein  6.5 - 8.1 g/dL 5.9   7.7   Total Bilirubin 0.3 - 1.2 mg/dL 1.0   0.8   Alkaline Phos 38 - 126 U/L 35   44   AST 15 - 41 U/L 123   201   ALT 0 - 44 U/L 70   96    CK: 3695 TSH: 0.299  Assessment/Plan:   Principal Problem:   Acute ischemic right PCA stroke (HCC) Active Problems:   Hypertension   Dysphagia due to recent cerebrovascular accident (CVA)   Elevated liver enzymes   Rhabdomyolysis   Patient Summary: Jason Jones is a 71 y.o. with a PMH of hypertension, tobacco use disorder, prostate cancer, who presented with left-sided hemiparesis, left-sided facial droop and dysarthria, found on the floor of his home after 2 days down and was admitted for acute stroke and AKI secondary to rhabdomyolysis.   Acute CVA right-sided PCA territory Dysarthria and left-sided hemiparesis MRI brain shows large acute stroke in right PCA territory that correlates with  the patient's focal neurologic deficits.  MRA shows occlusion of right PCA P1 segment and 50% stenosis of proximal left ICA, suggestive of significant ASCVD burden.  Risk factors for same include tobacco use disorder and hypertension.  TTE without evidence of cardioembolic source.  No known history of atrial fibrillation. - Continue aspirin 81 mg daily indefinitely - Continue Plavix 75 mg daily until 04/15/2022 - Holding atorvastatin for elevated liver enzymes - Telemetry for arrhythmia detection - Appreciate PT/OT/SLP recommendations  Oropharyngeal dysphagia Failed bedside swallow.  SLP recommending no more than ice chips and crushed meds for now.  Pending modified barium swallow study.  Continue IVF while patient is n.p.o. - LR IV infusion 100 mL/h  Hypertension Outside of the permissive hypertension window.  Recommend slow normalization of blood pressure with ultimate goal < 130 SBP. - Continue amlodipine 5 mg daily  BPH Chronic and stable. - Continue tamsulosin 0.4 mg daily  Elevated liver enzymes Hepatic  steatosis Chronic hepatitis C Improving from yesterday.  Patient has a history of hepatic steatosis and alcohol use disorder.  Per family, he has not been drinking as much since his cancer diagnosis.  Thus I do not think this is due to alcoholic hepatitis.  In 2021, and hepatitis C RNA was undetectable.  Elevated liver enzymes may be due to rhabdomyolysis. - Follow-up morning CMP  AKI secondary to rhabdomyolysis, resolved Serum creatinine back to normal.  CK downtrending to 3695 from a high of approximately 8000 on admission.  Diet: NPO IVF: LR,100cc/hr VTE: Enoxaparin Code: Full PT/OT recs: Pending. Family Update: Called and spoke to daughter, Jason Jones  Dispo: Anticipate discharge to SNF or acute rehab pending work-up and treatment of acute stroke.   Nani Gasser MD 03/25/2022, 2:42 PM  Pager: [phone] After 5pm on weekdays and 1pm on weekends: On Call pager: [phone]

## 2022-03-25 NOTE — Plan of Care (Signed)
  Problem: Education: Goal: Knowledge of General Education information will improve Description: Including pain rating scale, medication(s)/side effects and non-pharmacologic comfort measures Outcome: Progressing   Problem: Health Behavior/Discharge Planning: Goal: Ability to manage health-related needs will improve Outcome: Progressing   Problem: Clinical Measurements: Goal: Ability to maintain clinical measurements within normal limits will improve Outcome: Progressing Goal: Will remain free from infection Outcome: Progressing Goal: Diagnostic test results will improve Outcome: Progressing Goal: Respiratory complications will improve Outcome: Progressing Goal: Cardiovascular complication will be avoided Outcome: Progressing   Problem: Activity: Goal: Risk for activity intolerance will decrease Outcome: Progressing   Problem: Nutrition: Goal: Adequate nutrition will be maintained Outcome: Progressing   Problem: Coping: Goal: Level of anxiety will decrease Outcome: Progressing   Problem: Elimination: Goal: Will not experience complications related to bowel motility Outcome: Progressing Goal: Will not experience complications related to urinary retention Outcome: Progressing   Problem: Pain Managment: Goal: General experience of comfort will improve Outcome: Progressing   Problem: Safety: Goal: Ability to remain free from injury will improve Outcome: Progressing   Problem: Skin Integrity: Goal: Risk for impaired skin integrity will decrease Outcome: Progressing   Problem: Education: Goal: Knowledge of disease or condition will improve Outcome: Progressing Goal: Knowledge of secondary prevention will improve (SELECT ALL) Outcome: Progressing Goal: Knowledge of patient specific risk factors will improve (INDIVIDUALIZE FOR PATIENT) Outcome: Progressing Goal: Individualized Educational Video(s) Outcome: Progressing   Problem: Coping: Goal: Will verbalize  positive feelings about self Outcome: Progressing Goal: Will identify appropriate support needs Outcome: Progressing   Problem: Health Behavior/Discharge Planning: Goal: Ability to manage health-related needs will improve Outcome: Progressing   Problem: Self-Care: Goal: Ability to participate in self-care as condition permits will improve Outcome: Progressing Goal: Verbalization of feelings and concerns over difficulty with self-care will improve Outcome: Progressing Goal: Ability to communicate needs accurately will improve Outcome: Progressing   Problem: Nutrition: Goal: Risk of aspiration will decrease Outcome: Progressing Goal: Dietary intake will improve Outcome: Progressing   Problem: Ischemic Stroke/TIA Tissue Perfusion: Goal: Complications of ischemic stroke/TIA will be minimized Outcome: Progressing

## 2022-03-25 NOTE — ED Notes (Signed)
Unit Secretary, Rose called at this time. Pt brother at bedside requesting pt keys and wallet. Spoke to pt and pt family at this time and discussed pt belongings upon arrival consisting of long john pants, a cut off shirt via EMS, a cell phone and a charger that pt kept at bedside. No wallet or keys were found upon arrival via EMS. Pt brother verbalizes understanding.

## 2022-03-25 NOTE — Progress Notes (Signed)
Pt noted to have difficulty chewing dys 3 diet - pt stated he only has 2 teeth.  Diet downgraded to Dys 2 diet for easier chewing.

## 2022-03-25 NOTE — Evaluation (Addendum)
Clinical/Bedside Swallow Evaluation Patient Details  Name: Jason Jones MRN: [id number] Date of Birth: 1950-08-21  Today's Date: 03/25/2022 Time: SLP Start Time (ACUTE ONLY): 1042 SLP Stop Time (ACUTE ONLY): 1100 SLP Time Calculation (min) (ACUTE ONLY): 18 min  Past Medical History:  Past Medical History:  Diagnosis Date   Arthritis    Asthma    Cancer ([place])    Chronic alcohol abuse    Dyspnea    GERD (gastroesophageal reflux disease)    PRN  ---  TAKES BAKING SODA IN WATER   History of pneumothorax    04-28-2006  fell, left fx rib--  resolved w/ chest tube   Hypertension    Nocturia    Poor dental hygiene    Right inguinal hernia    Weak urinary stream    Past Surgical History:  Past Surgical History:  Procedure Laterality Date   COLONOSCOPY  01/08/2021   GOLD SEED IMPLANT N/A 05/23/2021   Procedure: GOLD SEED IMPLANT;  Surgeon: Ceasar Mons, MD;  Location: [facility];  Service: Urology;  Laterality: N/A;   INGUINAL HERNIA REPAIR Right 04/23/2017   Procedure: OPEN RIGHT INGUINAL HERNIA REPAIR WITH MESH;  Surgeon: Kinsinger, Arta Bruce, MD;  Location: [facility];  Service: General;  Laterality: Right;  GENERAL COMBINED WITH REGIONAL FOR POST OP PAIN    INSERTION OF MESH Right 04/23/2017   Procedure: INSERTION OF MESH;  Surgeon: Kinsinger, Arta Bruce, MD;  Location: [facility];  Service: General;  Laterality: Right;  GENERAL COMBINED WITH REGIONAL FOR POST OP PAIN    LIPOMA EXCISION Right 02/22/2015   Procedure: RIGHT SHOULDER MASS EXCISION 15 CM SQ;  Surgeon: Mickeal Skinner, MD;  Location[facility];  Service: General;  Laterality: Right;   SPACE OAR INSTILLATION N/A 05/23/2021   Procedure: SPACE OAR INSTILLATION;  Surgeon: Ceasar Mons, MD;  Location: [facility];  Service: Urology;  Laterality: N/A;   TRANSRECTAL ULTRASOUND N/A 05/23/2021   Procedure: TRANSRECTAL ULTRASOUND;  Surgeon: Ceasar Mons, MD;  Location: [facility];  Service: Urology;  Laterality: N/A;   HPI:  Pt is a 71 yo male wtih acute onset L sided weakness and fall 10/13, found down  and brought in by EMS 10/15. MRI revealed large R PCA CVA. PMH includes: GERD, alcohol use, prostate ca (2022) s/p external beam radiation (completed Feb 2023), prior Hepatitis C, HTN, tobacco use disorder    Assessment / Plan / Recommendation  Clinical Impression  Pt presents with L facial weakness (CN VII) that impacts labial seal, resulting in anterior loss of thin liquids via cup more than spoon, improving the most with straw. Pt also has prolonged mastication and oral residue with regular solid, for which pt asks for multiple liquid washes. Thin liquids, primarily after solids, result in coughing though. Question if his mastication abilities may be near baseline given his edentulous status, but in the setting of acute neurological changes, it is likely that he is not managing boluses as well as he might typically at home. Recommend proceeding with MBS as can be scheduled with radiology. In the interim, would provide meds crushed in puree. Pt can also have small amounts of ice chips, but would perform oral care provide ice chips only with staff given pt's impulsivity. SLP Visit Diagnosis: Dysphagia, unspecified (R13.10)    Aspiration Risk  Moderate aspiration risk    Diet Recommendation NPO except meds;Ice chips PRN after oral care   Medication Administration: Crushed with  puree Supervision: Staff to assist with self feeding;Full supervision/cueing for compensatory strategies Compensations: Minimize environmental distractions;Slow rate;Small sips/bites Postural Changes: Seated upright at 90 degrees;Remain upright for at least 30 minutes after po intake    Other  Recommendations Oral Care Recommendations: Oral care BID    Recommendations for follow up therapy are one component of a multi-disciplinary discharge planning  process, led by the attending physician.  Recommendations may be updated based on patient status, additional functional criteria and insurance authorization.  Follow up Recommendations  (tba)      Assistance Recommended at Discharge Frequent or constant Supervision/Assistance  Functional Status Assessment Patient has had a recent decline in their functional status and demonstrates the ability to make significant improvements in function in a reasonable and predictable amount of time.  Frequency and Duration            Prognosis Prognosis for Safe Diet Advancement: Good      Swallow Study   General HPI: Pt is a 71 yo male wtih acute onset L sided weakness and fall 10/13, found down  and brought in by EMS 10/15. MRI revealed large R PCA CVA. PMH includes: GERD, alcohol use, prostate ca (2022) s/p external beam radiation (completed Feb 2023), prior Hepatitis C, HTN, tobacco use disorder Type of Study: Bedside Swallow Evaluation Previous Swallow Assessment: none in chart Diet Prior to this Study: NPO Temperature Spikes Noted: No Respiratory Status: Nasal cannula History of Recent Intubation: No Behavior/Cognition: Alert;Cooperative;Impulsive Oral Cavity Assessment: Within Functional Limits Oral Care Completed by SLP: No Oral Cavity - Dentition: Edentulous Self-Feeding Abilities: Needs assist Patient Positioning: Upright in bed Baseline Vocal Quality: Hoarse Volitional Cough: Weak Volitional Swallow: Able to elicit    Oral/Motor/Sensory Function Overall Oral Motor/Sensory Function: Moderate impairment Facial ROM: Reduced left;Suspected CN VII (facial) dysfunction Facial Symmetry: Abnormal symmetry left;Suspected CN VII (facial) dysfunction Facial Strength: Reduced left;Suspected CN VII (facial) dysfunction Lingual ROM: Within Functional Limits Lingual Symmetry: Within Functional Limits Lingual Strength: Reduced (bilaterally)   Ice Chips Ice chips: Within functional  limits Presentation: Spoon   Thin Liquid Thin Liquid: Impaired Presentation: Cup;Spoon;Straw Oral Phase Functional Implications: Left anterior spillage Pharyngeal  Phase Impairments: Cough - Immediate    Nectar Thick Nectar Thick Liquid: Not tested   Honey Thick Honey Thick Liquid: Not tested   Puree Puree: Within functional limits Presentation: Spoon   Solid     Solid: Impaired Oral Phase Impairments: Impaired mastication Oral Phase Functional Implications: Oral residue;Impaired mastication      Osie Bond., M.A. [place] Office [phone]  Secure chat preferred  03/25/2022,11:20 AM

## 2022-03-25 NOTE — ED Notes (Signed)
Speech at bedside

## 2022-03-25 NOTE — Evaluation (Signed)
Speech Language Pathology Evaluation Patient Details Name: Jason Jones MRN: [id number] DOB: 22-Jul-1950 Today's Date: 03/25/2022 Time: 1100-1113 SLP Time Calculation (min) (ACUTE ONLY): 13 min  Problem List:  Patient Active Problem List   Diagnosis Date Noted   Acute ischemic right PCA stroke ([place]) 03/25/2022   Stroke ([place]) 03/24/2022   Malignant neoplasm of prostate ([place]) 02/20/2021   Past Medical History:  Past Medical History:  Diagnosis Date   Arthritis    Asthma    Cancer ([place])    Chronic alcohol abuse    Dyspnea    GERD (gastroesophageal reflux disease)    PRN  ---  TAKES BAKING SODA IN WATER   History of pneumothorax    04-28-2006  fell, left fx rib--  resolved w/ chest tube   Hypertension    Nocturia    Poor dental hygiene    Right inguinal hernia    Weak urinary stream    Past Surgical History:  Past Surgical History:  Procedure Laterality Date   COLONOSCOPY  01/08/2021   GOLD SEED IMPLANT N/A 05/23/2021   Procedure: GOLD SEED IMPLANT;  Surgeon: Ceasar Mons, MD;  Location: [facility];  Service: Urology;  Laterality: N/A;   INGUINAL HERNIA REPAIR Right 04/23/2017   Procedure: OPEN RIGHT INGUINAL HERNIA REPAIR WITH MESH;  Surgeon: Kinsinger, Arta Bruce, MD;  Location: [facility];  Service: General;  Laterality: Right;  GENERAL COMBINED WITH REGIONAL FOR POST OP PAIN    INSERTION OF MESH Right 04/23/2017   Procedure: INSERTION OF MESH;  Surgeon: Kinsinger, Arta Bruce, MD;  Location: [facility];  Service: General;  Laterality: Right;  GENERAL COMBINED WITH REGIONAL FOR POST OP PAIN    LIPOMA EXCISION Right 02/22/2015   Procedure: RIGHT SHOULDER MASS EXCISION 15 CM SQ;  Surgeon: Mickeal Skinner, MD;  Location: [facility];  Service: General;  Laterality: Right;   SPACE OAR INSTILLATION N/A 05/23/2021   Procedure: SPACE OAR INSTILLATION;  Surgeon: Ceasar Mons, MD;  Location: [facility];  Service:  Urology;  Laterality: N/A;   TRANSRECTAL ULTRASOUND N/A 05/23/2021   Procedure: TRANSRECTAL ULTRASOUND;  Surgeon: Ceasar Mons, MD;  Location: [facility];  Service: Urology;  Laterality: N/A;   HPI:  Pt is a 71 yo male wtih acute onset L sided weakness and fall 10/13, found down  and brought in by EMS 10/15. MRI revealed large R PCA CVA. PMH includes: GERD, alcohol use, prostate ca (2022) s/p external beam radiation (completed Feb 2023), prior Hepatitis C, HTN, tobacco use disorder   Assessment / Plan / Recommendation Clinical Impression  Pt is impulsive, disoriented to time, and mixing up his left vs his right. He's partially oriented to situation, acknowleding that he's had a stroke but with some confusion in trying to recall other details. He demonstrates some intellectual awareness of acute deficits, without as much evidence of emergent or anticipatory awareness. He has significant difficulty with functional verbal problem solving, in part related to working memory. He recalled 1/4 words on delayed recall task which increased to 2/4 with cueing. His speech is characterized by rough vocal quality and imprecise articulation, impacting intelligibility of speech especially at the sentence level. Pt will benefit from ongoing SLP f/u for cognition, communication, and safety.    SLP Assessment  SLP Recommendation/Assessment: Patient needs continued Speech Lanaguage Pathology Services SLP Visit Diagnosis: Cognitive communication deficit (R41.841);Dysarthria and anarthria (R47.1)    Recommendations for follow up therapy are one component of a  multi-disciplinary discharge planning process, led by the attending physician.  Recommendations may be updated based on patient status, additional functional criteria and insurance authorization.    Follow Up Recommendations  Acute inpatient rehab (3hours/day)    Assistance Recommended at Discharge  Frequent or constant Supervision/Assistance   Functional Status Assessment Patient has had a recent decline in their functional status and demonstrates the ability to make significant improvements in function in a reasonable and predictable amount of time.  Frequency and Duration min 2x/week  2 weeks      SLP Evaluation Cognition  Overall Cognitive Status: No family/caregiver present to determine baseline cognitive functioning Orientation Level: Oriented to person;Oriented to place;Oriented to situation;Disoriented to time Month: September Day of Week: Incorrect Attention: Sustained Sustained Attention: Impaired Sustained Attention Impairment: Verbal basic Memory: Impaired Memory Impairment: Retrieval deficit;Decreased recall of new information Awareness: Impaired Awareness Impairment: Emergent impairment;Anticipatory impairment Problem Solving: Impaired Problem Solving Impairment: Verbal complex Behaviors: Impulsive Safety/Judgment: Impaired       Comprehension  Auditory Comprehension Overall Auditory Comprehension: Appears within functional limits for tasks assessed    Expression Expression Primary Mode of Expression: Verbal Verbal Expression Overall Verbal Expression: Appears within functional limits for tasks assessed   Oral / Motor  Oral Motor/Sensory Function Overall Oral Motor/Sensory Function: Moderate impairment Facial ROM: Reduced left;Suspected CN VII (facial) dysfunction Facial Symmetry: Abnormal symmetry left;Suspected CN VII (facial) dysfunction Facial Strength: Reduced left;Suspected CN VII (facial) dysfunction Lingual ROM: Within Functional Limits Lingual Symmetry: Within Functional Limits Lingual Strength: Reduced Motor Speech Overall Motor Speech: Impaired Respiration: Within functional limits Phonation: Hoarse Resonance: Within functional limits Articulation: Impaired Level of Impairment: Sentence Intelligibility: Intelligibility reduced Sentence: 50-74% accurate Motor Planning: Witnin  functional limits Motor Speech Errors: Not applicable            Osie Bond., M.A. [place] Office [phone]  Secure chat preferred  03/25/2022, 11:29 AM

## 2022-03-25 NOTE — Progress Notes (Addendum)
STROKE TEAM PROGRESS NOTE   INTERVAL HISTORY Seen in ED, he has been impulsive and easily agitated with staff, however is pleasant upon my exam. He does say that he is hungry  SLP to plan for MBS today May need a CTA head and neck  Vitals:   03/25/22 1230 03/25/22 1300 03/25/22 1315 03/25/22 1330  BP: 133/80 (!) 148/90 (!) 142/76 129/74  Pulse: (!) 49 (!) 56 (!) 41 (!) 44  Resp: '16 19 14 19  '$ Temp:      TempSrc:      SpO2: 98% 97% 99% 99%   CBC:  Recent Labs  Lab 03/24/22 1521 03/24/22 1550 03/25/22 0413  WBC 7.8  --  7.0  NEUTROABS 6.3  --   --   HGB 14.3 14.6 12.8*  HCT 43.0 43.0 40.4  MCV 91.9  --  94.2  PLT 287  --  664   Basic Metabolic Panel:  Recent Labs  Lab 03/24/22 1521 03/24/22 1550 03/25/22 0014 03/25/22 0413  NA 146* 150*  --  145  K 3.8 4.0  --  4.3  CL 113* 116*  --  115*  CO2 23  --   --  21*  GLUCOSE 118* 114*  --  101*  BUN 35* 41*  --  26*  CREATININE 1.25* 1.10  --  0.90  CALCIUM 9.8  --   --  8.6*  MG  --   --  2.3  --   PHOS  --   --  3.4  --    Lipid Panel:  Recent Labs  Lab 03/25/22 0011  CHOL 130  TRIG 83  HDL 29*  CHOLHDL 4.5  VLDL 17  LDLCALC 84   HgbA1c:  Recent Labs  Lab 03/25/22 0014  HGBA1C 6.0*   Urine Drug Screen:  Recent Labs  Lab 03/24/22 1510  LABOPIA NONE DETECTED  COCAINSCRNUR NONE DETECTED  LABBENZ NONE DETECTED  AMPHETMU NONE DETECTED  THCU NONE DETECTED  LABBARB NONE DETECTED    Alcohol Level  Recent Labs  Lab 03/24/22 1521  ETH <10    IMAGING past 24 hours DG Swallowing Func-Speech Pathology  Result Date: 03/25/2022 Table formatting from the original result was not included. Objective Swallowing Evaluation: Type of Study: Bedside Swallow Evaluation  Patient Details Name: Jason Jones MRN: [id number] Date of Birth: Jun 08, 1951 Today's Date: 03/25/2022 Time: SLP Start Time (ACUTE ONLY): 5638 -SLP Stop Time (ACUTE ONLY): 7564 SLP Time Calculation (min) (ACUTE ONLY): 20 min Past Medical History:  Past Medical History: Diagnosis Date  Arthritis   Asthma   Cancer ([place])   Chronic alcohol abuse   Dyspnea   GERD (gastroesophageal reflux disease)   PRN  ---  TAKES BAKING SODA IN WATER  History of pneumothorax   04-28-2006  fell, left fx rib--  resolved w/ chest tube  Hypertension   Nocturia   Poor dental hygiene   Right inguinal hernia   Weak urinary stream  Past Surgical History: Past Surgical History: Procedure Laterality Date  COLONOSCOPY  01/08/2021  GOLD SEED IMPLANT N/A 05/23/2021  Procedure: GOLD SEED IMPLANT;  Surgeon: Ceasar Mons, MD;  Location: [facility];  Service: Urology;  Laterality: N/A;  INGUINAL HERNIA REPAIR Right 04/23/2017  Procedure: OPEN RIGHT INGUINAL HERNIA REPAIR WITH MESH;  Surgeon: Kinsinger, Arta Bruce, MD;  Location: [facility];  Service: General;  Laterality: Right;  GENERAL COMBINED WITH REGIONAL FOR POST OP PAIN   INSERTION OF MESH Right 04/23/2017  Procedure: INSERTION  OF MESH;  Surgeon: Kinsinger, Arta Bruce, MD;  Location: [facility];  Service: General;  Laterality: Right;  GENERAL COMBINED WITH REGIONAL FOR POST OP PAIN   LIPOMA EXCISION Right 02/22/2015  Procedure: RIGHT SHOULDER MASS EXCISION 15 CM SQ;  Surgeon: Mickeal Skinner, MD;  Location: [facility];  Service: General;  Laterality: Right;  SPACE OAR INSTILLATION N/A 05/23/2021  Procedure: SPACE OAR INSTILLATION;  Surgeon: Ceasar Mons, MD;  Location: [facility];  Service: Urology;  Laterality: N/A;  TRANSRECTAL ULTRASOUND N/A 05/23/2021  Procedure: TRANSRECTAL ULTRASOUND;  Surgeon: Ceasar Mons, MD;  Location: [facility];  Service: Urology;  Laterality: N/A; HPI: Pt is a 71 yo male wtih acute onset L sided weakness and fall 10/13, found down  and brought in by EMS 10/15. MRI revealed large R PCA CVA. PMH includes: GERD, alcohol use, prostate ca (2022) s/p external beam radiation (completed Feb 2023), prior Hepatitis C, HTN, tobacco use  disorder  Subjective: asking for ice chips  Recommendations for follow up therapy are one component of a multi-disciplinary discharge planning process, led by the attending physician.  Recommendations may be updated based on patient status, additional functional criteria and insurance authorization. Assessment / Plan / Recommendation   03/25/2022   3:05 PM Clinical Impressions Clinical Impression Pt presents with oropharyngeal dysphagia characterized by a pharyngeal delay and reduction in labial seal, poterior propulsion, tongue base retraction, and bolus cohesion. Pt exhibited vallecular residue, difficulty with A-P transport (regular texture solids and of the barium tablet), and liquid boluses were often triggered with the head of the bolus at the level of the pyriform sinuses. Pt demonstrated penetration (PAS 3) with thin liquids via cup when boluses were administered by the SLP, but this progressed to PAS 5 and intermittently to aspiration (PAS 7) when pt self-fed. A single instance of aspiration (PAS 7) was noted with a large bolus of nectar thick liquids, due to spillover of prematurely spilled liquid in the pyriform sinuses, but this could not be replicated. Laryngeal invasion was eliminated with use of smaller bolus sizes combined with a chin tuck posture. However, often due to impulsivity, pt consistently demonstrated difficulty implementing one or both of these. A dysphagia 3 diet with nectar thick liquids will be initiated at this time. However, SLP is hopeful that the pt will be able to advanced to thin liquids this week with training for use of compensatory strategies. SLP will continue to follow pt. SLP Visit Diagnosis Dysphagia, oropharyngeal phase (R13.12) Impact on safety and function Mild aspiration risk     03/25/2022   3:05 PM Treatment Recommendations Treatment Recommendations Therapy as outlined in treatment plan below     03/25/2022   3:05 PM Prognosis Prognosis for Safe Diet Advancement Good  Barriers to Reach Goals Cognitive deficits   03/25/2022   3:05 PM Diet Recommendations SLP Diet Recommendations Dysphagia 3 (Mech soft) solids;Nectar thick liquid Liquid Administration via Cup;Straw Medication Administration Whole meds with liquid Compensations Minimize environmental distractions;Slow rate;Small sips/bites Postural Changes Seated upright at 90 degrees     03/25/2022   3:05 PM Other Recommendations Oral Care Recommendations Oral care BID;Staff/trained caregiver to provide oral care Other Recommendations Order thickener from pharmacy Follow Up Recommendations -- Assistance recommended at discharge Frequent or constant Supervision/Assistance Functional Status Assessment Patient has had a recent decline in their functional status and demonstrates the ability to make significant improvements in function in a reasonable and predictable amount of time.   03/25/2022  3:05 PM Frequency and Duration  Speech Therapy Frequency (ACUTE ONLY) min 2x/week Treatment Duration 2 weeks     03/25/2022   3:05 PM Oral Phase Oral Phase Impaired Oral - Nectar Cup Weak lingual manipulation;Reduced posterior propulsion;Decreased bolus cohesion;Premature spillage Oral - Nectar Straw Weak lingual manipulation;Reduced posterior propulsion;Decreased bolus cohesion;Premature spillage Oral - Thin Cup Weak lingual manipulation;Reduced posterior propulsion;Decreased bolus cohesion;Premature spillage Oral - Thin Straw Weak lingual manipulation;Reduced posterior propulsion;Decreased bolus cohesion;Premature spillage Oral - Puree Weak lingual manipulation;Reduced posterior propulsion;Decreased bolus cohesion;Premature spillage Oral - Regular Weak lingual manipulation;Reduced posterior propulsion;Decreased bolus cohesion;Premature spillage;Impaired mastication Oral - Pill Weak lingual manipulation;Reduced posterior propulsion;Decreased bolus cohesion;Premature spillage    03/25/2022   3:05 PM Pharyngeal Phase Pharyngeal Phase Impaired  Pharyngeal- Nectar Cup Delayed swallow initiation-pyriform sinuses;Pharyngeal residue - valleculae;Reduced tongue base retraction Pharyngeal- Nectar Straw Delayed swallow initiation-pyriform sinuses;Pharyngeal residue - valleculae;Reduced tongue base retraction;Penetration/Aspiration during swallow Pharyngeal Material enters airway, remains ABOVE vocal cords and not ejected out Pharyngeal- Thin Cup Delayed swallow initiation-pyriform sinuses;Pharyngeal residue - valleculae;Reduced tongue base retraction;Penetration/Aspiration during swallow Pharyngeal Material enters airway, remains ABOVE vocal cords and not ejected out;Material enters airway, CONTACTS cords and not ejected out;Material enters airway, passes BELOW cords and not ejected out despite cough attempt by patient Pharyngeal- Thin Straw Delayed swallow initiation-pyriform sinuses;Reduced tongue base retraction;Penetration/Aspiration during swallow Pharyngeal Material enters airway, remains ABOVE vocal cords and not ejected out;Material enters airway, CONTACTS cords and not ejected out;Material enters airway, passes BELOW cords and not ejected out despite cough attempt by patient Pharyngeal- Puree Delayed swallow initiation-pyriform sinuses;Pharyngeal residue - valleculae;Reduced tongue base retraction Pharyngeal- Regular Delayed swallow initiation-pyriform sinuses;Pharyngeal residue - valleculae;Reduced tongue base retraction Pharyngeal- Pill Delayed swallow initiation-pyriform sinuses;Reduced tongue base retraction    03/25/2022   3:05 PM Cervical Esophageal Phase  Cervical Esophageal Phase [facility] [address]ika I[address] Hardin Negus, [place], [place] Office number [phone] Horton Marshall 03/25/2022, 3:19 PM                     ECHOCARDIOGRAM COMPLETE  Result Date: 03/25/2022    ECHOCARDIOGRAM REPORT   Patient Name:   Jason Jones Date of Exam: 03/25/2022 Medical Rec #:  [id number]      Height:       71.0 in Accession #:    [id number]      Weight:       143.6 lb Date of Birth:  [birth year]      BSA:          1.832 m Patient Age:    19 years       BP:           159/96 mmHg Patient Gender: M              HR:           42 bpm. Exam Location:  Inpatient Procedure: 2D Echo, Color Doppler and Cardiac Doppler Indications:    stroke  History:        Patient has no prior history of Echocardiogram examinations.  Sonographer:    Melissa Morford RDCS (AE, PE) Referring Phys: [place]  1. Left ventricular ejection fraction, by estimation, is 60 to 65%. The left ventricle has normal function. The left ventricle has no regional wall motion abnormalities. Left ventricular diastolic parameters were normal.  2. Right ventricular systolic function is normal. The right ventricular size is normal.  3. The mitral valve is normal in structure. Trivial mitral valve regurgitation. No evidence of  mitral stenosis.  4. The aortic valve is tricuspid. Aortic valve regurgitation is not visualized. Aortic valve sclerosis/calcification is present, without any evidence of aortic stenosis. Comparison(s): No prior Echocardiogram. Conclusion(s)/Recommendation(s): No intracardiac source of embolism detected on this transthoracic study. Consider a transesophageal echocardiogram to exclude cardiac source of embolism if clinically indicated. FINDINGS  Left Ventricle: Left ventricular ejection fraction, by estimation, is 60 to 65%. The left ventricle has normal function. The left ventricle has no regional wall motion abnormalities. The left ventricular internal cavity size was normal in size. There is  no left ventricular hypertrophy. Left ventricular diastolic parameters were normal. Right Ventricle: The right ventricular size is normal. No increase in right ventricular wall thickness. Right ventricular systolic function is normal. Left Atrium: Left atrial size was normal in size. Right Atrium: Right atrial size was normal in size. Pericardium: There is no evidence of  pericardial effusion. Mitral Valve: The mitral valve is normal in structure. Trivial mitral valve regurgitation. No evidence of mitral valve stenosis. Tricuspid Valve: The tricuspid valve is normal in structure. Tricuspid valve regurgitation is trivial. Aortic Valve: The aortic valve is tricuspid. Aortic valve regurgitation is not visualized. Aortic valve sclerosis/calcification is present, without any evidence of aortic stenosis. Pulmonic Valve: The pulmonic valve was not well visualized. Pulmonic valve regurgitation is trivial. Aorta: The aortic root and ascending aorta are structurally normal, with no evidence of dilitation. Venous: The inferior vena cava was not well visualized. IAS/Shunts: The atrial septum is grossly normal.  LEFT VENTRICLE PLAX 2D LVIDd:         4.50 cm   Diastology LVIDs:         3.10 cm   LV e' medial:    11.20 cm/s LV PW:         0.80 cm   LV E/e' medial:  9.4 LV IVS:        0.90 cm   LV e' lateral:   12.90 cm/s LVOT diam:     2.30 cm   LV E/e' lateral: 8.1 LV SV:         86 LV SV Index:   47 LVOT Area:     4.15 cm  RIGHT VENTRICLE RV S prime:     14.80 cm/s TAPSE (M-mode): 2.4 cm LEFT ATRIUM           Index        RIGHT ATRIUM           Index LA diam:      3.00 cm 1.64 cm/m   RA Area:     13.10 cm LA Vol (A4C): 36.9 ml 20.15 ml/m  RA Volume:   32.70 ml  17.85 ml/m  AORTIC VALVE LVOT Vmax:   71.10 cm/s LVOT Vmean:  51.400 cm/s LVOT VTI:    0.208 m  AORTA Ao Root diam: 3.40 cm Ao Asc diam:  3.10 cm MITRAL VALVE                TRICUSPID VALVE MV Area (PHT): 5.66 cm     TR Peak grad:   30.9 mmHg MV Decel Time: 134 msec     TR Vmax:        278.00 cm/s MV E velocity: 105.00 cm/s MV A velocity: 68.60 cm/s   SHUNTS MV E/A ratio:  1.53         Systemic VTI:  0.21 m  Systemic Diam: 2.30 cm Gwyndolyn Kaufman MD Electronically signed by Gwyndolyn Kaufman MD Signature Date/Time: 03/25/2022/11:33:15 AM    Final    MR BRAIN WO CONTRAST  Result Date:  03/25/2022 CLINICAL DATA:  Acute neurologic deficit EXAM: MRI HEAD WITHOUT CONTRAST MRA HEAD WITHOUT CONTRAST MRA NECK WITHOUT AND WITH CONTRAST TECHNIQUE: Multiplanar, multi-echo pulse sequences of the brain and surrounding structures were acquired without intravenous contrast. Angiographic images of the Circle of Willis were acquired using MRA technique without intravenous contrast. Angiographic images of the neck were acquired using MRA technique without and with intravenous contrast. Carotid stenosis measurements (when applicable) are obtained utilizing NASCET criteria, using the distal internal carotid diameter as the denominator. CONTRAST:  6.24m GADAVIST GADOBUTROL 1 MMOL/ML IV SOLN COMPARISON:  None Available. 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 No acute or chronic hemorrhage. There is multifocal hyperintense T2-weighted signal within the white matter. Generalized volume loss. The midline structures are normal. Vascular: Major flow voids are preserved. Skull and upper cervical spine: Normal calvarium and skull base. Visualized upper cervical spine and soft tissues are normal. Sinuses/Orbits:No paranasal sinus fluid levels or advanced mucosal thickening. No mastoid or middle ear effusion. Normal orbits. MRA HEAD FINDINGS POSTERIOR CIRCULATION: --Vertebral arteries: Normal --Inferior cerebellar arteries: Normal. --Basilar artery: Normal. --Superior cerebellar arteries: Normal. --Posterior cerebral arteries: Right P1 segment is occluded. Left PCA is normal. ANTERIOR CIRCULATION: --Intracranial internal carotid arteries: Normal. --Anterior cerebral arteries (ACA): Normal. Absent left A1 segment, normal variant --Middle cerebral arteries (MCA): Normal. ANATOMIC VARIANTS: None MRA NECK FINDINGS Examination is degraded by motion and poor contrast. Aortic arch: Unremarkable Right  carotid system: Antegrade flow without significant stenosis. Left carotid system: Antegrade flow with probable 50% stenosis of the proximal ICA Vertebral arteries: Codominant.  Patent with antegrade flow. Other: None IMPRESSION: 1. 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 No hemorrhage or mass effect. 2. Occlusion of the right PCA P1 segment. 3. 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 Electronically Signed   By: KUlyses JarredM.D.   On: 03/25/2022 00:15   MR ANGIO NECK W WO CONTRAST  Result Date: 03/25/2022 CLINICAL DATA:  Acute neurologic deficit EXAM: MRI HEAD WITHOUT CONTRAST MRA HEAD WITHOUT CONTRAST MRA NECK WITHOUT AND WITH CONTRAST TECHNIQUE: Multiplanar, multi-echo pulse sequences of the brain and surrounding structures were acquired without intravenous contrast. Angiographic images of the Circle of Willis were acquired using MRA technique without intravenous contrast. Angiographic images of the neck were acquired using MRA technique without and with intravenous contrast. Carotid stenosis measurements (when applicable) are obtained utilizing NASCET criteria, using the distal internal carotid diameter as the denominator. CONTRAST:  6.562mGADAVIST GADOBUTROL 1 MMOL/ML IV SOLN COMPARISON:  None Available. 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 No acute or chronic hemorrhage. There is multifocal hyperintense T2-weighted signal within the white matter. Generalized volume loss. The midline structures are normal. Vascular: Major flow voids are preserved. Skull and upper cervical spine: Normal calvarium and skull base. Visualized upper cervical spine and soft tissues are normal. Sinuses/Orbits:No paranasal sinus fluid levels or advanced mucosal thickening. No mastoid or middle ear  effusion. Normal orbits. MRA HEAD FINDINGS POSTERIOR CIRCULATION: --Vertebral arteries: Normal --Inferior cerebellar arteries: Normal. --  Basilar artery: Normal. --Superior cerebellar arteries: Normal. --Posterior cerebral arteries: Right P1 segment is occluded. Left PCA is normal. ANTERIOR CIRCULATION: --Intracranial internal carotid arteries: Normal. --Anterior cerebral arteries (ACA): Normal. Absent left A1 segment, normal variant --Middle cerebral arteries (MCA): Normal. ANATOMIC VARIANTS: None MRA NECK FINDINGS Examination is degraded by motion and poor contrast. Aortic arch: Unremarkable Right carotid system: Antegrade flow without significant stenosis. Left carotid system: Antegrade flow with probable 50% stenosis of the proximal ICA Vertebral arteries: Codominant.  Patent with antegrade flow. Other: None IMPRESSION: 1. 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 No hemorrhage or mass effect. 2. Occlusion of the right PCA P1 segment. 3. 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 Electronically Signed   By: Ulyses Jarred M.D.   On: 03/25/2022 00:15   MR ANGIO HEAD WO CONTRAST  Result Date: 03/25/2022 CLINICAL DATA:  Acute neurologic deficit EXAM: MRI HEAD WITHOUT CONTRAST MRA HEAD WITHOUT CONTRAST MRA NECK WITHOUT AND WITH CONTRAST TECHNIQUE: Multiplanar, multi-echo pulse sequences of the brain and surrounding structures were acquired without intravenous contrast. Angiographic images of the Circle of Willis were acquired using MRA technique without intravenous contrast. Angiographic images of the neck were acquired using MRA technique without and with intravenous contrast. Carotid stenosis measurements (when applicable) are obtained utilizing NASCET criteria, using the distal internal carotid diameter as the denominator. CONTRAST:  6.69m GADAVIST GADOBUTROL 1 MMOL/ML IV SOLN COMPARISON:  None Available. FINDINGS:  MRI HEAD FINDINGS Brain: Large area of acute infarction in the right PCA territory, including portions of the right thalamus and the right cerebral peduncle. No acute or chronic hemorrhage. There is multifocal hyperintense T2-weighted signal within the white matter. Generalized volume loss. The midline structures are normal. Vascular: Major flow voids are preserved. Skull and upper cervical spine: Normal calvarium and skull base. Visualized upper cervical spine and soft tissues are normal. Sinuses/Orbits:No paranasal sinus fluid levels or advanced mucosal thickening. No mastoid or middle ear effusion. Normal orbits. MRA HEAD FINDINGS POSTERIOR CIRCULATION: --Vertebral arteries: Normal --Inferior cerebellar arteries: Normal. --Basilar artery: Normal. --Superior cerebellar arteries: Normal. --Posterior cerebral arteries: Right P1 segment is occluded. Left PCA is normal. ANTERIOR CIRCULATION: --Intracranial internal carotid arteries: Normal. --Anterior cerebral arteries (ACA): Normal. Absent left A1 segment, normal variant --Middle cerebral arteries (MCA): Normal. ANATOMIC VARIANTS: None MRA NECK FINDINGS Examination is degraded by motion and poor contrast. Aortic arch: Unremarkable Right carotid system: Antegrade flow without significant stenosis. Left carotid system: Antegrade flow with probable 50% stenosis of the proximal ICA Vertebral arteries: Codominant.  Patent with antegrade flow. Other: None IMPRESSION: 1. 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 No hemorrhage or mass effect. 2. Occlusion of the right PCA P1 segment. 3. 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 Electronically Signed   By: KUlyses JarredM.D.   On: 03/25/2022 00:15   DG Chest 1 View  Result Date: 03/24/2022 CLINICAL DATA:  [id number] for imaging to screen for metal prior to magnetic resonance imaging (MRI) [id number]: CHEST  1 VIEW  COMPARISON:  None Available. FINDINGS: Heart and mediastinal contours are within normal limits. No focal opacities or effusions. No acute bony abnormality. No radiopaque foreign bodies. Old healed left rib fractures. IMPRESSION: No active cardiopulmonary disease. Electronically Signed   By: KRolm BaptiseM.D.   On: 03/24/2022 20:10   DG Abd  1 View  Result Date: 03/24/2022 CLINICAL DATA:  [id number] Encounter for imaging to screen for metal prior to magnetic resonance imaging (MRI) [id number] EXAM: ABDOMEN - 1 VIEW COMPARISON:  None Available. FINDINGS: Small clips project over the lower pelvis. No additional radiopaque foreign bodies. No organomegaly or free air. Nonobstructive bowel gas pattern. IMPRESSION: Small surgical clips project over the lower pelvis which are likely safe for MRI imaging. Electronically Signed   By: Rolm Baptise M.D.   On: 03/24/2022 20:10   CT HEAD WO CONTRAST  Result Date: 03/24/2022 CLINICAL DATA:  Found down, stroke 2 days ago EXAM: CT HEAD WITHOUT CONTRAST TECHNIQUE: Contiguous axial images were obtained from the base of the skull through the vertex without intravenous contrast. RADIATION DOSE REDUCTION: This exam was performed according to the departmental dose-optimization program which includes automated exposure control, adjustment of the mA and/or kV according to patient size and/or use of iterative reconstruction technique. COMPARISON:  01/18/2012 FINDINGS: Brain: No evidence of hemorrhage, hydrocephalus, extra-axial collection or mass lesion/mass effect. Hypodensity in the medial right occipital lobe, reflecting a subacute right PCA distribution infarct (series 3/image 14), concordant with the clinical history. Vascular: Intracranial atherosclerosis. Skull: Normal. Negative for fracture or focal lesion. Sinuses/Orbits: The visualized paranasal sinuses are essentially clear. The mastoid air cells are unopacified. Other: None. IMPRESSION: Subacute right PCA distribution infarct,  concordant with the clinical history. No evidence of hemorrhagic transformation. Electronically Signed   By: Julian Hy M.D.   On: 03/24/2022 17:54    PHYSICAL EXAM  Physical Exam  Constitutional: Appears well-developed and well-nourished.   Cardiovascular: Normal rate and regular rhythm.  Respiratory: Effort normal, non-labored breathing  Neuro: Mental Status: Patient is awake, alert, oriented to person, place, month, and general situation. Initially states its 2022 and then corrects himself and he tells me he is in the hospital for a stroke.  Speech is dysarthric.  Cranial Nerves: II: Visual Fields are full. Pupils are equal, round, and reactive to light.   III,IV, VI: EOMI without ptosis or diploplia.  V: Facial sensation is symmetric to temperature VII: Left facial droop VIII: Hearing is intact to voice X: Palate elevates symmetrically XI: Shoulder shrug is symmetric. XII: Tongue protrudes midline without atrophy or fasciculations.  Motor: Bulk is poor Full movement in right upper and lower extremities.  Flaccid in LUE LLE 1/5 Sensory: Sensation is symmetric to light touch and temperature in the arms and legs. No extinction to DSS present.  Unable to do HKS or FNF on the left Able to complete on the right with no ataxia   ASSESSMENT/PLAN Jason Jones is a 71 y.o. male with history of PMH significant for alcohol use, GERD, hypertension hepatitis C, smoker who was brought in by EMS for being down on the floor at his home for the last 2 days. MRI shows a right PCA territory infarct. DAPT initiated with ASA'81mg'$  and plavix '75mg'$ . MBS done by SLP today and he is now on a dysphagia 3 with nectar thickened liquids.   Stroke:  Right PCA territory infarct Etiology:  large vessel disease  Code Stroke CT head Subacute right PCA distribution infarct   MRI  Large area of acute infarction in the right PCA territory, including portions of the right thalamus and the right  cerebral peduncle MRA  Occlusion of the right PCA P1 segment. 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 2D Echo EF 60-65% Consider Ziopatch at discharge.  LDL 84 HgbA1c 6.0 VTE prophylaxis - lovenox  No antithrombotic prior to admission, now on aspirin 81 mg daily and clopidogrel 75 mg daily DAPT for 3 months and then aspirin alone given intracranial vascular occlusion. Therapy recommendations:  pending Disposition:  pending  Hypertension Home meds:  amlodipine Stable Permissive hypertension (OK if < 220/120) but gradually normalize in 5-7 days Long-term BP goal normotensive  Hyperlipidemia LDL 84, goal < 70 Will start crestor 20 once LFT improves Continue statin at discharge  Dysphagia Speech on board Passed swallow on dysphagia 3 and nectar thick liquid Aspiration precaution  Tobacco abuse Current smoker Smoking cessation counseling provided Pt is willing to quit  Other Stroke Risk Factors Advanced Age >/= 53  Alcohol abuse - family said he has cut down a lot  Other Active Problems Severe hepatic steatosis Increased Liver enzymes 2021 Fibroscan negative for fibrosis. His liver chemistries are significant for AST 201, ALT 96 on admission, increased from 2021. CK trending down  Chronic Hep C Patient was last evaluated by transplant hepatology in 2021. At the time, 2021 Hep C RNA undetectable, positive for Hep C antibody. No current concern for reinfection, thus no repeat Hep C viral load at this time.   Prostate adenocarcinoma , 2022 S/p external beam radiation 03/2021 -07/2021 Followed in Urology by Dr. Gilford Rile and Rad onc by Dr. Tammi Klippel. Unable to see all external documents, but per chart review patient was found to have T1c, grade 3 prostate cancer, and elected to undergo external beam radiation therapy for primary treatment. First treatment in December 2022, last treatment 07/2021. Last follow up was on 12/18/2021; patient s/p low dose CT chest without signs of  malignancy. Patient is to follow up with surveillance low dose CT scan in July 2024.   BPH Resume Tamsulosin 0.4 mg daily  Hospital day # 1  Patient seen and examined by NP/APP with MD. MD to update note as needed.   Janine Ores, DNP, FNP-BC [facility] Pager: [phone]  ATTENDING NOTE: I reviewed above note and agree with the assessment and plan. Pt was seen and examined.   71 year old male with history of smoker, hypertension, hep C, prostate cancer status post seed, alcohol abuse admitted for left-sided weakness, found down at home for 2 days.  CT showed right PCA subacute infarct.  MRI showed right PCA infarct involving right thalamus and right cerebral peduncle.  MRA head and neck showed right P1 occlusion and left ICA 50% stenosis.  EF 60 to 65%, LDL 84, A1c 6.0, UDS negative.  Creatinine 0.9, AST/ALT 123/70.  CK elevated.  On exam, patient nephew at bedside.  Patient lethargic, however awake alert, orientated x3 but severe dysarthria, however able to name and repeat no aphasia follows all simple commands.  Right hemianopia, no gaze palsy, peripheral left CN VII palsy, left upper extremity 0/5, left lower extremity 2/5 proximal, 0/5 distal.  Left lower extremity decreased light touch sensation.  Etiology for patient right PCA infarct likely due to large vessel disease with right PCA occlusion and uncontrolled risk factors.  Still recommend Zio patch at discharge to rule out A-fib.  Passed swallow, on diet, recommend aspirin 81 and Plavix 75 DAPT for 3 months and then aspirin alone given intracranial stenosis/occlusion.  We will start statin once AST/ALT normalized.  Smoking cessation education provided.  Aggressive risk factor modification and PT/OT.  On IV fluids for rhabdomyolysis which is improving.  May consider a Captiva trial if interested.  We will follow.  For detailed assessment and plan, please refer to above/below  as I have made changes wherever appropriate.    Rosalin Hawking, MD PhD Stroke Neurology 03/25/2022 6:37 PM   To contact Stroke Continuity provider, please refer to [email or web address] After hours, contact General Neurology

## 2022-03-25 NOTE — ED Notes (Signed)
Pt transferred into room 21. Pt cleaned up of urine at this time. Turned and rolled. Pt noted to have abrasion to R knee and L hip. Pt denies any pain with movement. Pt placed in a gown. An dback into the hallway. Side rails up bed in lowest position.

## 2022-03-25 NOTE — ED Notes (Signed)
Pt O2 sats dropping, placed on 2L oxygen

## 2022-03-25 NOTE — H&P (Addendum)
Date: 03/25/2022               Patient Name:  Jason Jones MRN: [id number]  DOB: 25-Sep-1950 Age / Sex: 71 y.o., male   PCP: Arthur Holms, NP         Medical Service: Internal Medicine Teaching Service         Attending Physician: : Dr. Dareen Piano    First Contact: Nani Gasser, MD      Pager: MM [phone]      Second Contact: Lorine Bears      Pager: Liliane Shi [phone]           After Hours (After 5p/  First Contact Pager: [phone]  weekends / holidays): Second Contact Pager: [phone]   SUBJECTIVE   Chief Complaint: Left sided weakness  History of Present Illness:  71yo male with hx of prostate cancer 2022  s/p external beam radiation completed in feb 2023, prior Hepatitis C, HTN, and tobacco use disorder presenting to ED after being found down at home with left-sided deficits concerning for acute stroke   Patient was in his usual state of health until Friday 10/13, patient was watching TV and felt weak on the L side of his face and arm. He could not move his body as usual, so she slid off the bed and attempted to crawl to his kitchen, where he started pounding on the wall. Patient was laying on the ground for 2 days until Sunday 10/15, when the neighbors heard him and called 911.  Patient's brother present at bedside reports that he was with patient earlier on Friday morning. Patient had been complaining of balance problems before he was dropped off at home.  Patient denies experiencing chest pain, sob, lightheadedness, chest palpitations, changes in vision, HA prior to noting his acute weakness. He denied hitting his head or any other body part.   Other than increased thirst, patient denies pain or any other acute symptom.   ED Course: Patient was found to be dysarthric, with L facial droop, L upper and lower extremity weakness.  CBC wnl, CMP: Na 146, Cr 1.25, mild transaminates, CK 8094, U/A+hbg, -RBC, +ketones, hyaline and granular casts. CT head with subacute R PCA  infarct. MR brain with large acute infarct of R PCA, R thalamus, R cerebral peduncle, and occlusion of the R PCA segment. 50% stenosis of the proximal L  ICA. Neurology was consulted; given timeline of stroke, patient not a candidate for acute interventions. Patient was given 1.5L IVF fluids and ASA 324 mg. Patient was admitted to IMTS for further work up and management.  Meds:  No outpatient medications have been marked as taking for the 03/24/22 encounter [facility] Encounter).    Past Medical History  Past Surgical History:  Procedure Laterality Date   COLONOSCOPY  01/08/2021   GOLD SEED IMPLANT N/A 05/23/2021   Procedure: GOLD SEED IMPLANT;  Surgeon: Ceasar Mons, MD;  Location: [facility];  Service: Urology;  Laterality: N/A;   INGUINAL HERNIA REPAIR Right 04/23/2017   Procedure: OPEN RIGHT INGUINAL HERNIA REPAIR WITH MESH;  Surgeon: Kinsinger, Arta Bruce, MD;  Location: [place];  Service: General;  Laterality: Right;  GENERAL COMBINED WITH REGIONAL FOR POST OP PAIN    INSERTION OF MESH Right 04/23/2017   Procedure: INSERTION OF MESH;  Surgeon: Kinsinger, Arta Bruce, MD;  Location: [place];  Service: General;  Laterality: Right;  GENERAL COMBINED WITH REGIONAL FOR POST OP PAIN    LIPOMA EXCISION Right  02/22/2015   Procedure: RIGHT SHOULDER MASS EXCISION 15 CM SQ;  Surgeon: Arta Bruce Kinsinger, MD;  Location: [facility];  Service: General;  Laterality: Right;   SPACE OAR INSTILLATION N/A 05/23/2021   Procedure: SPACE OAR INSTILLATION;  Surgeon: Ceasar Mons, MD;  Location: [facility];  Service: Urology;  Laterality: N/A;   TRANSRECTAL ULTRASOUND N/A 05/23/2021   Procedure: TRANSRECTAL ULTRASOUND;  Surgeon: Ceasar Mons, MD;  Location: [facility];  Service: Urology;  Laterality: N/A;    Social:  Lives by himself in [place], [place] Occupation: used to work at [employer] at [employer], now retired Support: His  brother and family Level of Function: Prior to stroke, patient independent with ADL and iADLs PCP: Arthur Holms, NP Substances: Current smoker, 2packs a day for the past 30+ years. Former EtOH user, 2 40oz per day, quit 3y ago, none since. No other drug use reported.  Family History:  Mother and siblings with history of CVA, HTN, HLD, and smoking.  Allergies: Allergies as of 03/24/2022 - Review Complete 01/11/2022  Allergen Reaction Noted   No known allergies  03/24/2022    Review of Systems: A complete ROS was negative except as per HPI.   OBJECTIVE:   Physical Exam: Blood pressure 137/72, pulse 70, temperature 98.5 F (36.9 C), temperature source Oral, resp. rate 20, SpO2 95 %.  Constitutional: ill-appearing man laying in bed, in no acute distress HENT: normocephalic atraumatic, mucous membranes moist Cardiovascular: regular rate and rhythm, no m/r/g, no JVD. 2+ bilateral  and symmetrical radial and DP pulses on exam Pulmonary/Chest: normal work of breathing on room air, lungs clear to auscultation bilaterally. No crackles  Abdominal: soft, non-tender, non-distended. No fluid wave Neurological: alert & oriented x 3, dysarthric speech, L sided facial weakness, and L sided tongue deviation on protrusion exam. Patient is able to speak in full sentences, responding appropriately. Full EOM and equal sensation bilaterally. Delayed FTN test on with R arm. No movement against gravity or at the joints on L upper and lower extremity. 5/5 strength on R upper and lower extremity. Bilateral sensation inctact and symmetrical. MSK: no gross abnormalities. No pitting edema Skin: warm and dry Psych: Normal mood and affect  Labs: CBC    Component Value Date/Time   WBC 7.8 03/24/2022 1521   RBC 4.68 03/24/2022 1521   HGB 14.6 03/24/2022 1550   HCT 43.0 03/24/2022 1550   PLT 287 03/24/2022 1521   MCV 91.9 03/24/2022 1521   MCH 30.6 03/24/2022 1521   MCHC 33.3 03/24/2022 1521   RDW 14.6  03/24/2022 1521   LYMPHSABS 0.9 03/24/2022 1521   MONOABS 0.5 03/24/2022 1521   EOSABS 0.1 03/24/2022 1521   BASOSABS 0.0 03/24/2022 1521     CMP     Component Value Date/Time   NA 150 (H) 03/24/2022 1550   K 4.0 03/24/2022 1550   CL 116 (H) 03/24/2022 1550   CO2 23 03/24/2022 1521   GLUCOSE 114 (H) 03/24/2022 1550   BUN 41 (H) 03/24/2022 1550   CREATININE 1.10 03/24/2022 1550   CALCIUM 9.8 03/24/2022 1521   PROT 7.7 03/24/2022 1521   ALBUMIN 4.1 03/24/2022 1521   AST 201 (H) 03/24/2022 1521   ALT 96 (H) 03/24/2022 1521   ALKPHOS 44 03/24/2022 1521   BILITOT 0.8 03/24/2022 1521   GFRNONAA >60 03/24/2022 1521   GFRAA >60 04/21/2017 0957    Imaging: MR BRAIN WO CONTRAST  Result Date: 03/25/2022 CLINICAL DATA:  Acute neurologic deficit  EXAM: MRI HEAD WITHOUT CONTRAST MRA HEAD WITHOUT CONTRAST MRA NECK WITHOUT AND WITH CONTRAST TECHNIQUE: Multiplanar, multi-echo pulse sequences of the brain and surrounding structures were acquired without intravenous contrast. Angiographic images of the Circle of Willis were acquired using MRA technique without intravenous contrast. Angiographic images of the neck were acquired using MRA technique without and with intravenous contrast. Carotid stenosis measurements (when applicable) are obtained utilizing NASCET criteria, using the distal internal carotid diameter as the denominator. CONTRAST:  6.62m GADAVIST GADOBUTROL 1 MMOL/ML IV SOLN COMPARISON:  None Available. 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 No acute or chronic hemorrhage. There is multifocal hyperintense T2-weighted signal within the white matter. Generalized volume loss. The midline structures are normal. Vascular: Major flow voids are preserved. Skull and upper cervical spine: Normal calvarium and skull base. Visualized upper cervical spine and soft tissues are  normal. Sinuses/Orbits:No paranasal sinus fluid levels or advanced mucosal thickening. No mastoid or middle ear effusion. Normal orbits. MRA HEAD FINDINGS POSTERIOR CIRCULATION: --Vertebral arteries: Normal --Inferior cerebellar arteries: Normal. --Basilar artery: Normal. --Superior cerebellar arteries: Normal. --Posterior cerebral arteries: Right P1 segment is occluded. Left PCA is normal. ANTERIOR CIRCULATION: --Intracranial internal carotid arteries: Normal. --Anterior cerebral arteries (ACA): Normal. Absent left A1 segment, normal variant --Middle cerebral arteries (MCA): Normal. ANATOMIC VARIANTS: None MRA NECK FINDINGS Examination is degraded by motion and poor contrast. Aortic arch: Unremarkable Right carotid system: Antegrade flow without significant stenosis. Left carotid system: Antegrade flow with probable 50% stenosis of the proximal ICA Vertebral arteries: Codominant.  Patent with antegrade flow. Other: None IMPRESSION: 1. 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 No hemorrhage or mass effect. 2. Occlusion of the right PCA P1 segment. 3. 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 Electronically Signed   By: KUlyses JarredM.D.   On: 03/25/2022 00:15   MR ANGIO NECK W WO CONTRAST  Result Date: 03/25/2022 CLINICAL DATA:  Acute neurologic deficit EXAM: MRI HEAD WITHOUT CONTRAST MRA HEAD WITHOUT CONTRAST MRA NECK WITHOUT AND WITH CONTRAST TECHNIQUE: Multiplanar, multi-echo pulse sequences of the brain and surrounding structures were acquired without intravenous contrast. Angiographic images of the Circle of Willis were acquired using MRA technique without intravenous contrast. Angiographic images of the neck were acquired using MRA technique without and with intravenous contrast. Carotid stenosis measurements (when applicable) are obtained utilizing NASCET criteria, using the distal internal carotid diameter  as the denominator. CONTRAST:  6.555mGADAVIST GADOBUTROL 1 MMOL/ML IV SOLN COMPARISON:  None Available. 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 No acute or chronic hemorrhage. There is multifocal hyperintense T2-weighted signal within the white matter. Generalized volume loss. The midline structures are normal. Vascular: Major flow voids are preserved. Skull and upper cervical spine: Normal calvarium and skull base. Visualized upper cervical spine and soft tissues are normal. Sinuses/Orbits:No paranasal sinus fluid levels or advanced mucosal thickening. No mastoid or middle ear effusion. Normal orbits. MRA HEAD FINDINGS POSTERIOR CIRCULATION: --Vertebral arteries: Normal --Inferior cerebellar arteries: Normal. --Basilar artery: Normal. --Superior cerebellar arteries: Normal. --Posterior cerebral arteries: Right P1 segment is occluded. Left PCA is normal. ANTERIOR CIRCULATION: --Intracranial internal carotid arteries: Normal. --Anterior cerebral arteries (ACA): Normal. Absent left A1 segment, normal variant --Middle cerebral  arteries (MCA): Normal. ANATOMIC VARIANTS: None MRA NECK FINDINGS Examination is degraded by motion and poor contrast. Aortic arch: Unremarkable Right carotid system: Antegrade flow without significant stenosis. Left carotid system: Antegrade flow with probable 50% stenosis of the proximal ICA Vertebral arteries: Codominant.  Patent with antegrade flow. Other: None IMPRESSION: 1. 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 No hemorrhage or mass effect. 2. Occlusion of the right PCA P1 segment. 3. 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 Electronically Signed   By: Ulyses Jarred M.D.   On: 03/25/2022 00:15   MR ANGIO HEAD WO CONTRAST  Result Date: 03/25/2022 CLINICAL DATA:   Acute neurologic deficit EXAM: MRI HEAD WITHOUT CONTRAST MRA HEAD WITHOUT CONTRAST MRA NECK WITHOUT AND WITH CONTRAST TECHNIQUE: Multiplanar, multi-echo pulse sequences of the brain and surrounding structures were acquired without intravenous contrast. Angiographic images of the Circle of Willis were acquired using MRA technique without intravenous contrast. Angiographic images of the neck were acquired using MRA technique without and with intravenous contrast. Carotid stenosis measurements (when applicable) are obtained utilizing NASCET criteria, using the distal internal carotid diameter as the denominator. CONTRAST:  6.53m GADAVIST GADOBUTROL 1 MMOL/ML IV SOLN COMPARISON:  None Available. 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 No acute or chronic hemorrhage. There is multifocal hyperintense T2-weighted signal within the white matter. Generalized volume loss. The midline structures are normal. Vascular: Major flow voids are preserved. Skull and upper cervical spine: Normal calvarium and skull base. Visualized upper cervical spine and soft tissues are normal. Sinuses/Orbits:No paranasal sinus fluid levels or advanced mucosal thickening. No mastoid or middle ear effusion. Normal orbits. MRA HEAD FINDINGS POSTERIOR CIRCULATION: --Vertebral arteries: Normal --Inferior cerebellar arteries: Normal. --Basilar artery: Normal. --Superior cerebellar arteries: Normal. --Posterior cerebral arteries: Right P1 segment is occluded. Left PCA is normal. ANTERIOR CIRCULATION: --Intracranial internal carotid arteries: Normal. --Anterior cerebral arteries (ACA): Normal. Absent left A1 segment, normal variant --Middle cerebral arteries (MCA): Normal. ANATOMIC VARIANTS: None MRA NECK FINDINGS Examination is degraded by motion and poor contrast. Aortic arch: Unremarkable Right carotid system: Antegrade flow  without significant stenosis. Left carotid system: Antegrade flow with probable 50% stenosis of the proximal ICA Vertebral arteries: Codominant.  Patent with antegrade flow. Other: None IMPRESSION: 1. 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 No hemorrhage or mass effect. 2. Occlusion of the right PCA P1 segment. 3. 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 Electronically Signed   By: KUlyses JarredM.D.   On: 03/25/2022 00:15   DG Chest 1 View  Result Date: 03/24/2022 CLINICAL DATA:  [id number] for imaging to screen for metal prior to magnetic resonance imaging (MRI) [id number]: CHEST  1 VIEW COMPARISON:  None Available. FINDINGS: Heart and mediastinal contours are within normal limits. No focal opacities or effusions. No acute bony abnormality. No radiopaque foreign bodies. Old healed left rib fractures. IMPRESSION: No active cardiopulmonary disease. Electronically Signed   By: KRolm BaptiseM.D.   On: 03/24/2022 20:10   DG Abd 1 View  Result Date: 03/24/2022 CLINICAL DATA:  [id number] for imaging to screen for metal prior to magnetic resonance imaging (MRI) [id number]: ABDOMEN - 1 VIEW COMPARISON:  None Available. FINDINGS: Small clips project over the  lower pelvis. No additional radiopaque foreign bodies. No organomegaly or free air. Nonobstructive bowel gas pattern. IMPRESSION: Small surgical clips project over the lower pelvis which are likely safe for MRI imaging. Electronically Signed   By: Rolm Baptise M.D.   On: 03/24/2022 20:10   CT HEAD WO CONTRAST  Result Date: 03/24/2022 CLINICAL DATA:  Found down, stroke 2 days ago EXAM: CT HEAD WITHOUT CONTRAST TECHNIQUE: Contiguous axial images were obtained from the base of the skull through the vertex without intravenous contrast. RADIATION DOSE REDUCTION: This exam was performed according to the departmental dose-optimization program which  includes automated exposure control, adjustment of the mA and/or kV according to patient size and/or use of iterative reconstruction technique. COMPARISON:  01/18/2012 FINDINGS: Brain: No evidence of hemorrhage, hydrocephalus, extra-axial collection or mass lesion/mass effect. Hypodensity in the medial right occipital lobe, reflecting a subacute right PCA distribution infarct (series 3/image 14), concordant with the clinical history. Vascular: Intracranial atherosclerosis. Skull: Normal. Negative for fracture or focal lesion. Sinuses/Orbits: The visualized paranasal sinuses are essentially clear. The mastoid air cells are unopacified. Other: None. IMPRESSION: Subacute right PCA distribution infarct, concordant with the clinical history. No evidence of hemorrhagic transformation. Electronically Signed   By: Julian Hy M.D.   On: 03/24/2022 17:54      EKG: personally reviewed my interpretation is NSR with nonspecific T wave abnormalities and no acute ischemia. T wave abnormalities not seen on previous EKG 2022. ASSESSMENT & PLAN:   Assessment & Plan by Problem: Principal Problem:   Stroke ([place])   Earl Losee is a 71 y.o. person living with a history of prostate cancer s/p external beam radiation completed in feb 2023, chronic hepatitis C, hepatic steatosis, HTN, and BPH who presenting with L sided weakness and admitted for R PCA CVA on hospital day 1  R PCA CVA Dysarthria and L sided weakness Patient  found down at home s/p 2 days of L sided weakness. CT head with subacute R PCA infarct and MR brain with large area of acute R PCA infarct, including portions of the R thalamus and the R cerebral peduncle, which correlate with patient's clinical presentation. Patient was also noted to have occlusion of the R PCA P1 segment. Given known tobacco use hx, HTN, and occlusion of the R PCA, CVA likely in the setting of athero-thrombotic event. Neurology was consulted, appreciate recommendations. Patient  not a candidate for acute interventions at this time. No baseline lipid panel, A1c on record. Patient is not on lipid lowering medications currently. Will initiate stroke work up and manage patient for secondary prevention. Patient did not pass swallow study at bedside. Will consult PT/OT/SLP for further management and discharge planning -A1c: 6.0 -Lipid panel and TSH, pending -TTE, pending -q4 neuro checks -Fall precautions -PT/OT/SLP, pending  AKI, resolved Rhabdomyolysis Cr. 1.25, K 3.8 on admission. CK 8094, U/A with +hemoglobin and -RBC, consistent with myoglobinuria. Granular and hyaline casts were also present on U/A. EKG with nonspecific T waves abnormalities since prior EKG but normal intervals. Patient is s/p AKI resolved after IV fluid resuscitation. Pt received 1.5L IVF in the ED, followed by 2L IVF by IMTS team. He is currently on 131m continuous infusion for rhabdomyolysis. Repeat CK and uric acid, pending.  -Continue 150cc/HR -Strict I/O -Uric acid and repeat CK, pending   HTN SBP 130s since admission. Patient is outside window for permissive hypertension. Home regimen includes Amlodipine 2.5 mg. Patient BP goal should be <130/80. -CTM -Start therapy with 5 mg Amlodipine  in AM  Severe hepatic steatosis Prior EtOH use disorder Increased Liver enzymes 2021 Fibroscan negative for fibrosis. Patient with chronic HTN. Per transplant hepatology note in 2021, patient to be yearly screened for HLD and DM. Unable to see whether patient has followed up with PCP or elsewhere. Current A1c is 6.0/ HLD is pending. His liver chemistries are significant for AST 201, ALT 96 on admission, increased from 2021. Suspect rhabdomyolysis is contributing to current transaminitis, though difficult to predict trend without recent liver chemistries.  -CTM liver function  Chronic Hep C Patient was last evaluated by transplant hepatology in 2021. At the time, 2021 Hep C RNA undetectable, positive for Hep  C antibody. No current concern for reinfection, thus no repeat Hep C viral load at this time.   Prostate adenocarcinoma , 2022 S/p external beam radiation 03/2021 -07/2021 Followed in Urology by Dr. Gilford Rile and Rad onc by Dr. Tammi Klippel. Unable to see all external documents, but per chart review patient was found to have T1c, grade 3 prostate cancer, and elected to undergo external beam radiation therapy for primary treatment. First treatment in December 2022, last treatment 07/2021. Last follow up was on 12/18/2021; patient s/p low dose CT chest without signs of malignancy. Patient is to follow up with surveillance low dose CT scan in July 2024. No current lesions on brain scan.  BPH Chronic, stable as seen per MR prostate on 12/21/2020. -Resume Tamsulosin 0.4 mg daily  Diet: NPO VTE: Enoxaparin Code: Full  Prior to Admission Living Arrangement: Home, living alone Anticipated Discharge Location:  pending PT/OT recommendations Barriers to Discharge: Clinical stability/improvement  Dispo: Admit patient to Observation with expected length of stay less than 2 midnights.  Signed:   Romana Juniper, MD Internal Medicine Resident PGY-1 03/25/2022, 12:23 AM

## 2022-03-25 NOTE — Progress Notes (Addendum)
OT Cancellation Note  Patient Details Name: Jason Jones MRN: [id number] DOB: 1951-02-21   Cancelled Treatment:    Reason Eval/Treat Not Completed: Other (comment) MD team rounding on pt now. Will follow up as schedule permits to initiate OT eval.  Re-attempted around 1:45PM for OT eval with pt currently out of room  Layla Maw 03/25/2022, 9:08 AM

## 2022-03-25 NOTE — Progress Notes (Signed)
Modified Barium Swallow Progress Note  Patient Details  Name: Jason Jones MRN: [id number] Date of Birth: 1950-07-14  Today's Date: 03/25/2022  Modified Barium Swallow completed.  Full report located under Chart Review in the Imaging Section.  Brief recommendations include the following:  Clinical Impression  Pt presents with oropharyngeal dysphagia characterized by a pharyngeal delay and reduction in labial seal, poterior propulsion, tongue base retraction, and bolus cohesion. Pt exhibited vallecular residue, difficulty with A-P transport (regular texture solids and of the barium tablet), and liquid boluses were often triggered with the head of the bolus at the level of the pyriform sinuses. Pt demonstrated penetration (PAS 3) with thin liquids via cup when boluses were administered by the SLP, but this progressed to PAS 5 and intermittently to aspiration (PAS 7) when pt self-fed. A single instance of aspiration (PAS 7) was noted with a large bolus of nectar thick liquids, due to spillover of prematurely spilled liquid in the pyriform sinuses, but this could not be replicated. Laryngeal invasion was eliminated with use of smaller bolus sizes combined with a chin tuck posture. However, often due to impulsivity, pt consistently demonstrated difficulty implementing one or both of these. A dysphagia 3 diet with nectar thick liquids will be initiated at this time. However, SLP is hopeful that the pt will be able to advanced to thin liquids this week with training for use of compensatory strategies. SLP will continue to follow pt.     Swallow Evaluation Recommendations       SLP Diet Recommendations: Dysphagia 3 (Mech soft) solids;Nectar thick liquid   Liquid Administration via: Cup;Straw   Medication Administration: Whole meds with liquid   Supervision: Full supervision/cueing for compensatory strategies;Staff to assist with self feeding   Compensations: Minimize environmental  distractions;Slow rate;Small sips/bites   Postural Changes: Seated upright at 90 degrees   Oral Care Recommendations: Oral care BID;Staff/trained caregiver to provide oral care   Other Recommendations: Order thickener from [place] [address], [place], [place] Office number [phone]  Horton Marshall 03/25/2022,3:14 PM

## 2022-03-25 NOTE — ED Notes (Signed)
MD gave pt a cup of ice, RN went to check on pt, pt covered in ice and soaked bed. RN cleaned pt, new sheets, gown, chuck applied. Condom cath in place. Pt repositioned and given warm blankets.

## 2022-03-26 DIAGNOSIS — I69322 Dysarthria following cerebral infarction: Secondary | ICD-10-CM

## 2022-03-26 DIAGNOSIS — I69352 Hemiplegia and hemiparesis following cerebral infarction affecting left dominant side: Secondary | ICD-10-CM

## 2022-03-26 DIAGNOSIS — R1312 Dysphagia, oropharyngeal phase: Secondary | ICD-10-CM

## 2022-03-26 LAB — COMPREHENSIVE METABOLIC PANEL
ALT: 69 U/L — ABNORMAL HIGH (ref 0–44)
AST: 84 U/L — ABNORMAL HIGH (ref 15–41)
Albumin: 3.1 g/dL — ABNORMAL LOW (ref 3.5–5.0)
Alkaline Phosphatase: 38 U/L (ref 38–126)
Anion gap: 7 (ref 5–15)
BUN: 22 mg/dL (ref 8–23)
CO2: 24 mmol/L (ref 22–32)
Calcium: 9.2 mg/dL (ref 8.9–10.3)
Chloride: 113 mmol/L — ABNORMAL HIGH (ref 98–111)
Creatinine, Ser: 1.01 mg/dL (ref 0.61–1.24)
GFR, Estimated: >60 mL/min (ref >60)
Glucose, Bld: 103 mg/dL — ABNORMAL HIGH (ref 70–99)
Potassium: 3.3 mmol/L — ABNORMAL LOW (ref 3.5–5.1)
Sodium: 144 mmol/L (ref 135–145)
Total Bilirubin: 0.5 mg/dL (ref 0.3–1.2)
Total Protein: 6.3 g/dL — ABNORMAL LOW (ref 6.5–8.1)

## 2022-03-26 LAB — CBC
HCT: 37.6 % — ABNORMAL LOW (ref 39.0–52.0)
Hemoglobin: 12.5 g/dL — ABNORMAL LOW (ref 13.0–17.0)
MCH: 30.2 pg (ref 26.0–34.0)
MCHC: 33.2 g/dL (ref 30.0–36.0)
MCV: 90.8 fL (ref 80.0–100.0)
Platelets: 243 10*3/uL (ref 150–400)
RBC: 4.14 MIL/uL — ABNORMAL LOW (ref 4.22–5.81)
RDW: 14.2 % (ref 11.5–15.5)
WBC: 6.3 10*3/uL (ref 4.0–10.5)
nRBC: 0 % (ref 0.0–0.2)

## 2022-03-26 LAB — CK: Total CK: 1712 U/L — ABNORMAL HIGH (ref 49–397)

## 2022-03-26 MED ORDER — DOXAZOSIN MESYLATE 1 MG PO TABS
1.0000 mg | ORAL_TABLET | Freq: Every day | ORAL | Status: DC
  Administered 2022-03-27 – 2022-03-28 (×2): 1 mg via ORAL
  Filled 2022-03-26 (×2): qty 1

## 2022-03-26 MED ORDER — LIDOCAINE HCL URETHRAL/MUCOSAL 2 % EX GEL
1.0000 | Freq: Once | CUTANEOUS | Status: DC
  Filled 2022-03-26: qty 6

## 2022-03-26 MED ORDER — POTASSIUM CHLORIDE 20 MEQ PO PACK
40.0000 meq | PACK | Freq: Four times a day (QID) | ORAL | Status: AC
  Administered 2022-03-26 (×2): 40 meq via ORAL
  Filled 2022-03-26 (×2): qty 2

## 2022-03-26 MED ORDER — ROSUVASTATIN CALCIUM 20 MG PO TABS
20.0000 mg | ORAL_TABLET | Freq: Every day | ORAL | Status: DC
  Administered 2022-03-26 – 2022-03-28 (×3): 20 mg via ORAL
  Filled 2022-03-26 (×3): qty 1

## 2022-03-26 NOTE — Progress Notes (Signed)
STROKE TEAM PROGRESS NOTE   INTERVAL HISTORY No acute event overnight, neuro stable, still has left hemianopia and left hemiplegia. LFT improving, will put on statin. Continue DAPT. PT/OT recommend CIR.   Vitals:   03/26/22 0412 03/26/22 0831 03/26/22 1140 03/26/22 1631  BP: (!) 140/89 130/69 (!) 142/103 (!) 141/86  Pulse: (!) 54 66 67 79  Resp: '20 16 17 17  '$ Temp: 99 F (37.2 C) 98.3 F (36.8 C) 98.4 F (36.9 C) 98.1 F (36.7 C)  TempSrc: Oral Oral Axillary Oral  SpO2: 94% 91% 97% 92%   CBC:  Recent Labs  Lab 03/24/22 1521 03/24/22 1550 03/25/22 0413 03/26/22 0336  WBC 7.8  --  7.0 6.3  NEUTROABS 6.3  --   --   --   HGB 14.3   < > 12.8* 12.5*  HCT 43.0   < > 40.4 37.6*  MCV 91.9  --  94.2 90.8  PLT 287  --  255 243   < > = values in this interval not displayed.   Basic Metabolic Panel:  Recent Labs  Lab 03/25/22 0014 03/25/22 0413 03/26/22 0336  NA  --  145 144  K  --  4.3 3.3*  CL  --  115* 113*  CO2  --  21* 24  GLUCOSE  --  101* 103*  BUN  --  26* 22  CREATININE  --  0.90 1.01  CALCIUM  --  8.6* 9.2  MG 2.3  --   --   PHOS 3.4  --   --    Lipid Panel:  Recent Labs  Lab 03/25/22 0011  CHOL 130  TRIG 83  HDL 29*  CHOLHDL 4.5  VLDL 17  LDLCALC 84   HgbA1c:  Recent Labs  Lab 03/25/22 0014  HGBA1C 6.0*   Urine Drug Screen:  Recent Labs  Lab 03/24/22 1510  LABOPIA NONE DETECTED  COCAINSCRNUR NONE DETECTED  LABBENZ NONE DETECTED  AMPHETMU NONE DETECTED  THCU NONE DETECTED  LABBARB NONE DETECTED    Alcohol Level  Recent Labs  Lab 03/24/22 1521  ETH <10    IMAGING past 24 hours No results found.  PHYSICAL EXAM  Physical Exam  Constitutional: Appears well-developed and well-nourished.   Cardiovascular: Normal rate and regular rhythm.  Respiratory: Effort normal, non-labored breathing  Neuro: lethargic, however awake alert, orientated x3 but severe dysarthria, however able to name and repeat no aphasia follows all simple  commands.  Right hemianopia, no gaze palsy, peripheral left CN VII palsy, left upper extremity 0/5, left lower extremity 2/5 proximal, 0/5 distal.  Left lower extremity decreased light touch sensation.   ASSESSMENT/PLAN [name] is a 71 y.o. male with history of PMH significant for alcohol use, GERD, hypertension hepatitis C, smoker who was brought in by EMS for being down on the floor at his home for the last 2 days. MRI shows a right PCA territory infarct. DAPT initiated with ASA'81mg'$  and plavix '75mg'$ . MBS done by SLP today and he is now on a dysphagia 3 with nectar thickened liquids.   Stroke:  Right PCA territory infarct with right P1 occlusion, likely due to large vessel disease  Code Stroke CT head Subacute right PCA distribution infarct   MRI  Large area of acute infarction in the right PCA territory, including portions of the right thalamus and the right cerebral peduncle MRA  Occlusion of the right PCA P1 segment. 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 2D Echo EF 60-65%  Recommend Ziopatch at discharge to rule out afib  LDL 84 HgbA1c 6.0 VTE prophylaxis - lovenox No antithrombotic prior to admission, now on aspirin 81 mg daily and clopidogrel 75 mg daily DAPT for 3 months and then aspirin alone given intracranial vascular occlusion. Therapy recommendations:  CIR Disposition:  pending  Hypertension Home meds:  amlodipine Stable Gradually normalize in 2-3 days Long-term BP goal normotensive  Hyperlipidemia LDL 84, goal < 70 AST/ALT 123/70->84/69 Start crestor 20  Continue statin at discharge  Dysphagia Speech on board Passed swallow on dysphagia 3 and nectar thick liquid Aspiration precaution  Tobacco abuse Current smoker Smoking cessation counseling provided Pt is willing to quit  Other Stroke Risk Factors Advanced Age >/= 54  Alcohol abuse - family said he has cut down a lot  Other Active Problems Severe hepatic steatosis Increased Liver  enzymes 2021 Fibroscan negative for fibrosis. His liver chemistries are significant for AST 201, ALT 96 on admission, increased from 2021. CK trending down  Chronic Hep C Patient was last evaluated by transplant hepatology in 2021. At the time, 2021 Hep C RNA undetectable, positive for Hep C antibody. No current concern for reinfection, thus no repeat Hep C viral load at this time.   Prostate adenocarcinoma , 2022 S/p external beam radiation 03/2021 -07/2021 Followed in Urology by Dr. Gilford Jones and Rad onc by Dr. Tammi Jones. Unable to see all external documents, but per chart review patient was found to have T1c, grade 3 prostate cancer, and elected to undergo external beam radiation therapy for primary treatment. First treatment in December 2022, last treatment 07/2021. Last follow up was on 12/18/2021; patient s/p low dose CT chest without signs of malignancy. Patient is to follow up with surveillance low dose CT scan in July 2024.   BPH Resume Tamsulosin 0.4 mg daily  Hospital day # 2  Neurology will sign off. Please call with questions. Pt will follow up with stroke clinic NP at [facility] in about 4 weeks. Thanks for the consult.   Jason Hawking, MD PhD Stroke Neurology 03/26/2022 5:41 PM   To contact Stroke Continuity provider, please refer to [email or web address] After hours, contact General Neurology

## 2022-03-26 NOTE — Progress Notes (Addendum)
Subjective:   Hospital day 2.  Interval events: None.  Feeling better today. Left sided weakness and facial droop have not improved. He still feels numbness of tongue. He also reports sensation of urinary retention.  Objective:  Vital signs: Blood pressure (!) 142/103, pulse 67, temperature 98.4 F (36.9 C), temperature source Axillary, resp. rate 17, SpO2 97 %.  Physical exam: Constitutional: No apparent distress. Left-sided hemiparesis. Cardiovascular: Regular rate and rhythm.  Left radial pulse normal. Pulmonary: Normal work of breathing.  Anterior lung fields clear to auscultation. Abdominal: Suprapubic tenderness. Skin: Warm and dry. Neuro: Gross left-sided hemiparesis.  Left-sided facial droop.  Dysarthric speech.   Intake/Output Summary (Last 24 hours) at 03/26/2022 1333 Last data filed at 03/26/2022 1100 Gross per 24 hour  Intake 1308.33 ml  Output 250 ml  Net 1058.33 ml    Pertinent Labs:    Latest Ref Rng & Units 03/26/2022    3:36 AM 03/25/2022    4:13 AM 03/24/2022    3:50 PM  CBC  WBC 4.0 - 10.5 K/uL 6.3  7.0    Hemoglobin 13.0 - 17.0 g/dL 12.5  12.8  14.6   Hematocrit 39.0 - 52.0 % 37.6  40.4  43.0   Platelets 150 - 400 K/uL 243  255         Latest Ref Rng & Units 03/26/2022    3:36 AM 03/25/2022    4:13 AM 03/24/2022    3:50 PM  CMP  Glucose 70 - 99 mg/dL 103  101  114   BUN 8 - 23 mg/dL 22  26  41   Creatinine 0.61 - 1.24 mg/dL 1.01  0.90  1.10   Sodium 135 - 145 mmol/L 144  145  150   Potassium 3.5 - 5.1 mmol/L 3.3  4.3  4.0   Chloride 98 - 111 mmol/L 113  115  116   CO2 22 - 32 mmol/L 24  21    Calcium 8.9 - 10.3 mg/dL 9.2  8.6    Total Protein 6.5 - 8.1 g/dL 6.3  5.9    Total Bilirubin 0.3 - 1.2 mg/dL 0.5  1.0    Alkaline Phos 38 - 126 U/L 38  35    AST 15 - 41 U/L 84  123    ALT 0 - 44 U/L 69  70     CK 1712  Assessment/Plan:   Principal Problem:   Acute ischemic right PCA stroke (HCC) Active Problems:    Hypertension   Dysphagia due to recent cerebrovascular accident (CVA)   Elevated liver enzymes   Rhabdomyolysis   Patient Summary: Jason Jones is a 71 y.o. with a PMH of hypertension, tobacco use disorder, and prostate cancer who presented with left-sided hemiparesis, left-sided facial droop, dysarthria, and was found to have a acute right PCA CVA.  Acute right-sided PCA CVA Dysarthria and left-sided hemiparesis Oropharyngeal dysphagia Neurologic exam grossly unchanged from yesterday.  Patient is in better spirits since he is able to eat and drink.  Still struggling with the dysphagia diet and has been downgraded to dysphagia 1 per SLP.  Will need Zio patch on discharge to complete work-up for stroke, though etiology is likely thromboembolic in the setting of ASCVD, tobacco use, poorly controlled hypertension.  CIR consult pending. - Continue Plavix and aspirin for 21 days then aspirin alone - Start rosuvastatin 20 mg daily - Continue telemetry -  PT/OT/SLP recommendations are appreciated  Urinary retention BPH Patient complains of difficulty urinating.  He reports a history of BPH.  Setting of acute stroke, I will evaluate whether this could also be due to neurogenic bladder.  Bladder scan showed approximately 850 mL in the bladder. - In and out cath today - Repeat bladder scan at 8 PM - Doxazosin 1 mg daily for BPH given patient has difficulty swallowing flomax tabs and doxazosin can be crushed  Hypertension Ultimate goal is slow normalization of blood pressure with systolic <115.  If still hypertensive tomorrow we will add a second agent - Continue amlodipine 5 mg daily  Diet:  dysphagia 1 IVF: None VTE: Enoxaparin Code: Full PT/OT recs: CIR TOC recs: CIR consult pending  Dispo: Anticipated discharge to  CIR  pending CIR consult and evaluation.   Nani Gasser MD 03/26/2022, 1:33 PM  Pager: [phone] After 5pm on weekdays and 1pm on weekends: On Call pager:  [phone]

## 2022-03-26 NOTE — Progress Notes (Deleted)
? ?  Inpatient Rehab Admissions Coordinator : ? ?Per therapy recommendations patient was screened for CIR candidacy by Porter Nakama RN MSN. Patient does not appear to demonstrate the medical neccesity for a Hospital Rehabilitation /CIR admit. I will not place a Rehab Consult. Recommend other Rehab Venues to be pursued. Please contact me with any questions. ? ?Teyana Pierron RN MSN ?Admissions Coordinator ?[phone]  ?

## 2022-03-26 NOTE — Evaluation (Signed)
Physical Therapy Evaluation  Patient Details Name: Jason Jones MRN: [id number] DOB: January 17, 1951 Today's Date: 03/26/2022  History of Present Illness  Pt is a 71 y/o male presenting on 10/15 with L sided weakness, fall 10/13.  MRI with Large R PCA CVA. PMH includes: alcohol use, prostate CA (2022) s/p external beam radiation (completed feb 2023), prior hepatitis C, HTN, tobacco use disorder.  Clinical Impression  Pt admitted with above diagnosis. Pt currently with functional limitations due to the deficits listed below (see PT Problem List). At the time of PT eval pt was able to perform transfers with up to +2 max assist. He was unable to progress to gait training at this time. Recommending AIR level follow up as pt was independent PTA. If AIR denies, will need SNF level rehab. Pt will benefit from skilled PT to increase their independence and safety with mobility to allow discharge to the venue listed below.          Recommendations for follow up therapy are one component of a multi-disciplinary discharge planning process, led by the attending physician.  Recommendations may be updated based on patient status, additional functional criteria and insurance authorization.  Follow Up Recommendations Acute inpatient rehab (3hours/day)      Assistance Recommended at Discharge Frequent or constant Supervision/Assistance  Patient can return home with the following  Two people to help with walking and/or transfers;Two people to help with bathing/dressing/bathroom;Assistance with cooking/housework;Assist for transportation;Help with stairs or ramp for entrance    Equipment Recommendations Other (comment) (TBD by next venue of care)  Recommendations for Other Services  Rehab consult    Functional Status Assessment Patient has had a recent decline in their functional status and demonstrates the ability to make significant improvements in function in a reasonable and predictable amount of time.      Precautions / Restrictions Precautions Precautions: Fall Precaution Comments: L hemiparesis, R hemianopsia (?) Restrictions Weight Bearing Restrictions: No      Mobility  Bed Mobility Overal bed mobility: Needs Assistance Bed Mobility: Supine to Sit     Supine to sit: Mod assist, +2 for physical assistance, +2 for safety/equipment     General bed mobility comments: cueing for sequencing, and use of rail, mod assist for L LE, scooting and trunk support on L side to ascend. bed pad utilized for transition around to sit fully at EOB    Transfers Overall transfer level: Needs assistance Equipment used: 2 person hand held assist Transfers: Sit to/from Stand, Bed to chair/wheelchair/BSC Sit to Stand: Max assist, +2 physical assistance, +2 safety/equipment Stand pivot transfers: Max assist, +2 physical assistance, +2 safety/equipment         General transfer comment: to power up and steady, pivot to recliner towards R side due to L sided hemiparesis. Needs L knee blocked and support at L hip during facilitation of weight shift.    Ambulation/Gait               General Gait Details: Unable to progress to gait training at this time.  Stairs            Wheelchair Mobility    Modified Rankin (Stroke Patients Only) Modified Rankin (Stroke Patients Only) Pre-Morbid Rankin Score: No symptoms Modified Rankin: Severe disability     Balance Overall balance assessment: Needs assistance Sitting-balance support: No upper extremity supported, Feet supported, Single extremity supported Sitting balance-Leahy Scale: Poor Sitting balance - Comments: preference to R UE support, without UE support max assist fading to min  with a few bouts of min guard for approx 30 seconds Postural control: Posterior lean, Left lateral lean Standing balance support: Bilateral upper extremity supported, During functional activity Standing balance-Leahy Scale: Zero Standing balance comment: +2  assist required                             Pertinent Vitals/Pain Pain Assessment Pain Assessment: 0-10 Pain Score: 9  Pain Location: lower abdomen, "he has to pee" Pain Descriptors / Indicators: Pressure Pain Intervention(s): Limited activity within patient's tolerance, Monitored during session, Repositioned    Home Living Family/patient expects to be discharged to:: Private residence Living Arrangements: Alone   Type of Home: Apartment Home Access: Elevator (16th)       Home Layout: One level Home Equipment: Grab bars - tub/shower      Prior Function Prior Level of Function : Independent/Modified Independent             Mobility Comments: no AD ADLs Comments: uses road scooter, independent ADLs, IADLs, med mgmt     Hand Dominance   Dominant Hand: Right    Extremity/Trunk Assessment   Upper Extremity Assessment Upper Extremity Assessment: LUE deficits/detail;Defer to OT evaluation    Lower Extremity Assessment Lower Extremity Assessment: LLE deficits/detail LLE Deficits / Details: No quad activation noted with isolated testing. Pt able to activate glutes/hamstrings however, approximately 2/5 strength. LLE Sensation: decreased light touch;decreased proprioception    Cervical / Trunk Assessment Cervical / Trunk Assessment: Other exceptions Cervical / Trunk Exceptions: Forward head posture with rounded shoulders.  Communication   Communication: Expressive difficulties  Cognition Arousal/Alertness: Awake/alert Behavior During Therapy: WFL for tasks assessed/performed Overall Cognitive Status: Impaired/Different from baseline Area of Impairment: Awareness, Problem solving, Orientation, Memory, Following commands, Attention, Safety/judgement                 Orientation Level: Disoriented to, Time Current Attention Level: Sustained Memory: Decreased short-term memory Following Commands: Follows one step commands consistently, Follows one  step commands with increased time, Follows multi-step commands inconsistently Safety/Judgement: Decreased awareness of safety, Decreased awareness of deficits Awareness: Emergent Problem Solving: Slow processing, Difficulty sequencing, Requires verbal cues, Decreased initiation General Comments: patient initally voicing august, but then corrects to october when cued.  Follows simple commands with delay but difficulty with sequencing, problem solving and requires verbal cueing.        General Comments      Exercises     Assessment/Plan    PT Assessment Patient needs continued PT services  PT Problem List Decreased strength;Decreased range of motion;Decreased activity tolerance;Decreased balance;Decreased mobility;Decreased safety awareness;Decreased knowledge of precautions;Pain;Decreased coordination;Decreased cognition;Decreased knowledge of use of DME       PT Treatment Interventions DME instruction;Gait training;Functional mobility training;Therapeutic activities;Therapeutic exercise;Balance training;Neuromuscular re-education;Cognitive remediation;Patient/family education    PT Goals (Current goals can be found in the Care Plan section)  Acute Rehab PT Goals Patient Stated Goal: Eventually return to his home PT Goal Formulation: With patient/family Time For Goal Achievement: 04/09/22 Potential to Achieve Goals: Good    Frequency Min 3X/week     Co-evaluation PT/OT/SLP Co-Evaluation/Treatment: Yes Reason for Co-Treatment: Complexity of the patient's impairments (multi-system involvement);For patient/therapist safety;To address functional/ADL transfers PT goals addressed during session: Mobility/safety with mobility;Balance;Strengthening/ROM         AM-PAC PT "6 Clicks" Mobility  Outcome Measure Help needed turning from your back to your side while in a flat bed without using bedrails?: Total Help needed moving  from lying on your back to sitting on the side of a flat  bed without using bedrails?: Total Help needed moving to and from a bed to a chair (including a wheelchair)?: Total Help needed standing up from a chair using your arms (e.g., wheelchair or bedside chair)?: Total Help needed to walk in hospital room?: Total Help needed climbing 3-5 steps with a railing? : Total 6 Click Score: 6    End of Session Equipment Utilized During Treatment: Gait belt Activity Tolerance: Patient tolerated treatment well Patient left: in chair;with chair alarm set;with call bell/phone within reach Nurse Communication: Mobility status;Need for lift equipment [facility] for back to bed) PT Visit Diagnosis: Unsteadiness on feet (R26.81);Pain Pain - part of body:  (abdomen)    Time: 7340-3709 PT Time Calculation (min) (ACUTE ONLY): 29 min   Charges:   PT Evaluation $PT Eval Moderate Complexity: 1 Mod          Rolinda Roan, PT, DPT Acute Rehabilitation Services Secure Chat Preferred Office: [phone]   Thelma Comp 03/26/2022, 1:39 PM

## 2022-03-26 NOTE — Progress Notes (Signed)
? ?  Inpatient Rehab Admissions Coordinator : ? ?Per therapy recommendations, patient was screened for CIR candidacy by Oval Cavazos RN MSN.  At this time patient appears to be a potential candidate for CIR. I will place a rehab consult per protocol for full assessment. Please call me with any questions. ? ?Izzy Doubek RN MSN ?Admissions Coordinator ?[phone] ?  ?

## 2022-03-26 NOTE — Progress Notes (Signed)
Speech Language Pathology Treatment: Dysphagia  Patient Details Name: Jason Jones MRN: [id number] DOB: 10-21-1950 Today's Date: 03/26/2022 Time: 1003-1020 SLP Time Calculation (min) (ACUTE ONLY): 17 min  Assessment / Plan / Recommendation Clinical Impression  Note that diet had been downgraded by nursing from Dys 3 to Dys 2 due to pt c/o food being too hard to chew. Today, pt and daughter both note increased difficulty eating chopped food (sausage) with associated coughing. He describes it as being "too dry" and "too hard." He occasional throat clearing with nectar thick liquids, even with Mod cues provided for him to try to use a chin tuck and take small sips. He ate pureed pineapple with no overt difficulties, and he would like to try getting a tray that contains only pureed foods. Will adjust diet further to Dys 1 (pureed) solids and nectar thick liquids. Will continue to follow.    HPI HPI: Pt is a 71 yo male wtih acute onset L sided weakness and fall 10/13, found down  and brought in by EMS 10/15. MRI revealed large R PCA CVA. PMH includes: GERD, alcohol use, prostate ca (2022) s/p external beam radiation (completed Feb 2023), prior Hepatitis C, HTN, tobacco use disorder      SLP Plan  Continue with current plan of care      Recommendations for follow up therapy are one component of a multi-disciplinary discharge planning process, led by the attending physician.  Recommendations may be updated based on patient status, additional functional criteria and insurance authorization.    Recommendations  Diet recommendations: Dysphagia 1 (puree);Nectar-thick liquid Liquids provided via: Cup;Straw Medication Administration: Whole meds with puree Supervision: Staff to assist with self feeding;Full supervision/cueing for compensatory strategies Compensations: Minimize environmental distractions;Slow rate;Small sips/bites Postural Changes and/or Swallow Maneuvers: Seated upright 90 degrees                 Oral Care Recommendations: Oral care BID Follow Up Recommendations: Acute inpatient rehab (3hours/day) Assistance recommended at discharge: Frequent or constant Supervision/Assistance SLP Visit Diagnosis: Dysphagia, oropharyngeal phase (R13.12) Plan: Continue with current plan of care           Osie Bond., M.A. [place] Office [phone]  Secure chat preferred   03/26/2022, 11:38 AM

## 2022-03-26 NOTE — Evaluation (Addendum)
Occupational Therapy Evaluation Patient Details Name: Jason Jones MRN: [id number] DOB: 1950/11/02 Today's Date: 03/26/2022   History of Present Illness Pt is a 71 y/o male presenting on 10/15 with L sided weakness, fall 10/13.  MRI with Large R PCA CVA. PMH includes: alcohol use, prostate CA (2022) s/p external beam radiation (completed feb 2023), prior hepatitis C, HTN, tobacco use disorder.   Clinical Impression   PTA patient independent with ADLs, mobility, IADLs.  Admitted for above and presents with problem list below, including L hemiparesis, decreased activity tolerance, impaired balance, and impaired cognition.  Pt initially disoriented to month but corrects when cued, follows simple commands with increased but demonstrating poor attention, problem solving, and safety.  Pt denies visual changes, not formally assessed but per chart R hemianopsia per neurology. He currently requires min to total assist +2 for ADLs, max assist +2 for transfers to recliner.  Based on performance today, believe he will best benefit from intensive AIR level rehab to optimize independence, safety for ADLs and mobility. Will follow.      Recommendations for follow up therapy are one component of a multi-disciplinary discharge planning process, led by the attending physician.  Recommendations may be updated based on patient status, additional functional criteria and insurance authorization.   Follow Up Recommendations  Acute inpatient rehab (3hours/day)    Assistance Recommended at Discharge Frequent or constant Supervision/Assistance  Patient can return home with the following Two people to help with walking and/or transfers;Two people to help with bathing/dressing/bathroom;Assistance with cooking/housework;Direct supervision/assist for medications management;Direct supervision/assist for financial management;Assist for transportation;Help with stairs or ramp for entrance;Assistance with feeding     Functional Status Assessment  Patient has had a recent decline in their functional status and demonstrates the ability to make significant improvements in function in a reasonable and predictable amount of time.  Equipment Recommendations  Other (comment) (defer)    Recommendations for Other Services Rehab consult     Precautions / Restrictions Precautions Precautions: Fall Precaution Comments: L hemiparesis Restrictions Weight Bearing Restrictions: No      Mobility Bed Mobility Overal bed mobility: Needs Assistance Bed Mobility: Supine to Sit     Supine to sit: Mod assist, +2 for physical assistance, +2 for safety/equipment     General bed mobility comments: cueing for sequencing, and use of rail, mod assist for L LE, scooting and trunk support on L side to ascend    Transfers Overall transfer level: Needs assistance Equipment used: 2 person hand held assist Transfers: Sit to/from Stand, Bed to chair/wheelchair/BSC Sit to Stand: Max assist, +2 physical assistance, +2 safety/equipment Stand pivot transfers: Max assist, +2 physical assistance, +2 safety/equipment         General transfer comment: to power up and steady, pivot to recliner towards R side due to L sided hemiparesis      Balance Overall balance assessment: Needs assistance Sitting-balance support: No upper extremity supported, Feet supported, Single extremity supported Sitting balance-Leahy Scale: Poor Sitting balance - Comments: preference to R UE support, without UE support max assist fading to min with a few bouts of min guard for approx 30 seconds Postural control: Posterior lean, Left lateral lean Standing balance support: Bilateral upper extremity supported, During functional activity Standing balance-Leahy Scale: Poor Standing balance comment: +2 assist required                           ADL either performed or assessed with clinical judgement  ADL Overall ADL's : Needs  assistance/impaired     Grooming: Moderate assistance;Sitting           Upper Body Dressing : Moderate assistance;Sitting   Lower Body Dressing: Total assistance;+2 for physical assistance;+2 for safety/equipment;Sit to/from stand   Toilet Transfer: Maximal assistance;+2 for physical assistance;+2 for safety/equipment;Stand-pivot Toilet Transfer Details (indicate cue type and reason): simulated to recliner Toileting- Clothing Manipulation and Hygiene: Total assistance;+2 for physical assistance;+2 for safety/equipment;Sit to/from stand       Functional mobility during ADLs: Maximal assistance;+2 for physical assistance;+2 for safety/equipment;Cueing for safety;Cueing for sequencing       Vision Baseline Vision/History: 0 No visual deficits Ability to See in Adequate Light: 0 Adequate Patient Visual Report: No change from baseline Additional Comments: continue to assess, able to scan to L and R but not formally assessed     Perception     Praxis      Pertinent Vitals/Pain Pain Assessment Pain Assessment: 0-10 Pain Score: 9  Pain Location: lower abdomen, "he as to pee" Pain Descriptors / Indicators: Pressure Pain Intervention(s): Limited activity within patient's tolerance, Monitored during session, Repositioned     Hand Dominance Right   Extremity/Trunk Assessment Upper Extremity Assessment Upper Extremity Assessment: LUE deficits/detail LUE Deficits / Details: grossly flaccid, able to elevate shoulder 2+/5 LUE Sensation: decreased light touch;decreased proprioception LUE Coordination: decreased gross motor;decreased fine motor   Lower Extremity Assessment Lower Extremity Assessment: Defer to PT evaluation       Communication Communication Communication: Expressive difficulties   Cognition Arousal/Alertness: Awake/alert Behavior During Therapy: WFL for tasks assessed/performed Overall Cognitive Status: Impaired/Different from baseline Area of Impairment:  Awareness, Problem solving, Orientation, Memory, Following commands, Attention, Safety/judgement                 Orientation Level: Disoriented to, Time Current Attention Level: Sustained Memory: Decreased short-term memory Following Commands: Follows one step commands consistently, Follows one step commands with increased time, Follows multi-step commands inconsistently Safety/Judgement: Decreased awareness of safety, Decreased awareness of deficits Awareness: Emergent Problem Solving: Slow processing, Difficulty sequencing, Requires verbal cues, Decreased initiation General Comments: patient initally voicing august, but then corrects to october when cued.  Follows simple commands with delay but difficulty with sequencing, problem solving and requires verbal cueing.     General Comments  daughter arrives during session, supportive    Exercises     Shoulder Instructions      Home Living Family/patient expects to be discharged to:: Private residence Living Arrangements: Alone   Type of Home: Apartment Home Access: Elevator (16th)     Home Layout: One level     Bathroom Shower/Tub: Teacher, early years/pre: Standard     Home Equipment: Grab bars - tub/shower          Prior Functioning/Environment Prior Level of Function : Independent/Modified Independent             Mobility Comments: no AD ADLs Comments: uses scooter, independent ADLs, IADLs, med mgmt        OT Problem List: Decreased strength;Decreased range of motion;Decreased activity tolerance;Impaired balance (sitting and/or standing);Impaired vision/perception;Decreased coordination;Decreased cognition;Decreased safety awareness;Decreased knowledge of use of DME or AE;Decreased knowledge of precautions;Impaired sensation;Impaired tone;Impaired UE functional use;Pain      OT Treatment/Interventions: Self-care/ADL training;Neuromuscular education;DME and/or AE instruction;Therapeutic  activities;Cognitive remediation/compensation;Visual/perceptual remediation/compensation;Patient/family education;Balance training    OT Goals(Current goals can be found in the care plan section) Acute Rehab OT Goals Patient Stated Goal: get better OT Goal Formulation:  With patient Time For Goal Achievement: 04/09/22 Potential to Achieve Goals: Good  OT Frequency: Min 2X/week    Co-evaluation PT/OT/SLP Co-Evaluation/Treatment: Yes Reason for Co-Treatment: For patient/therapist safety;To address functional/ADL transfers   OT goals addressed during session: ADL's and self-care      AM-PAC OT "6 Clicks" Daily Activity     Outcome Measure Help from another person eating meals?: A Little Help from another person taking care of personal grooming?: A Lot Help from another person toileting, which includes using toliet, bedpan, or urinal?: Total Help from another person bathing (including washing, rinsing, drying)?: A Lot Help from another person to put on and taking off regular upper body clothing?: A Lot Help from another person to put on and taking off regular lower body clothing?: Total 6 Click Score: 11   End of Session Equipment Utilized During Treatment: Gait belt Nurse Communication: Mobility status;Need for lift equipment  Activity Tolerance: Patient tolerated treatment well Patient left: in chair;with call bell/phone within reach;with chair alarm set;with family/visitor present  OT Visit Diagnosis: Other abnormalities of gait and mobility (R26.89);Muscle weakness (generalized) (M62.81);Hemiplegia and hemiparesis;Other symptoms and signs involving cognitive function;History of falling (Z91.81) Hemiplegia - Right/Left: Left Hemiplegia - dominant/non-dominant: Non-Dominant Hemiplegia - caused by: Cerebral infarction                Time: 9166-0600 OT Time Calculation (min): 28 min Charges:  OT General Charges $OT Visit: 1 Visit OT Evaluation $OT Eval Moderate Complexity: 1  Mod  Jolaine Artist, OT Acute Rehabilitation Services Office [phone]   Delight Stare 03/26/2022, 10:01 AM

## 2022-03-26 NOTE — TOC Initial Note (Addendum)
Transition of Care [facility]) - Initial/Assessment Note    Patient Details  Name: Jason Jones MRN: [id number] Date of Birth: 02-19-1951  [facility]) CM/SW Contact:    Pollie Friar, RN Phone Number: 03/26/2022, 1:07 PM  Clinical Narrative:                 Pt is from home alone. His daughter does check on him occasionally. No DME.  Pt walks to get to most places but his PCP: [facility] picks him up for his appointments. Pt was in agreement with CM entering him into [employer] for transportation assistance. Pt also has medicaid so information for DSS transportation line placed on AVS. Pt manages his own medications and denies any issues. Current recommendations are for CIR. If CIR doesn't accept he is willing to go to SNF for rehab and agreed to be faxed out in the [facility] area.  Pt will need ambulance to SNF if this is his d/c disposition. TOC following.  Expected Discharge Plan: IP Rehab Facility Barriers to Discharge: Continued Medical Work up   Patient Goals and CMS Choice   CMS Medicare.gov Compare Post Acute Care list provided to:: Patient Choice offered to / list presented to : Patient  Expected Discharge Plan and Services Expected Discharge Plan: [place] In-house Referral: Clinical Social Work Discharge Planning Services: CM Consult Post Acute Care Choice: IP Rehab Living arrangements for the past 2 months: Apartment                                      Prior Living Arrangements/Services Living arrangements for the past 2 months: Apartment Lives with:: Self Patient language and need for interpreter reviewed:: Yes Do you feel safe going back to the place where you live?: Yes      Need for Family Participation in Patient Care: Yes (Comment) Care giver support system in place?: No (comment)   Criminal Activity/Legal Involvement Pertinent to Current Situation/Hospitalization: No - Comment as needed  Activities of Daily  Living      Permission Sought/Granted                  Emotional Assessment Appearance:: Appears stated age Attitude/Demeanor/Rapport: Engaged Affect (typically observed): Accepting Orientation: : Oriented to Self, Oriented to Place, Oriented to  Time, Oriented to Situation   Psych Involvement: No (comment)  Admission diagnosis:  Stroke [facility]) [I63.9] Acute ischemic right PCA stroke ([place]) [I63.531] Encounter for imaging to screen for metal prior to magnetic resonance imaging (MRI) [Z01.89] Non-traumatic rhabdomyolysis [M62.82] Cerebrovascular accident (CVA), unspecified mechanism ([place]) [I63.9] Patient Active Problem List   Diagnosis Date Noted   Acute ischemic right PCA stroke ([place]) 03/25/2022   Hypertension 03/25/2022   Dysphagia due to recent cerebrovascular accident (CVA) 03/25/2022   Elevated liver enzymes 03/25/2022   Rhabdomyolysis 03/25/2022   Malignant neoplasm of prostate ([place]) 02/20/2021   PCP:  Arthur Holms, NP Pharmacy:   [id number] [place] [id number]: [phone]: [phone]    Social Determinants of Health (SDOH) Interventions    Readmission Risk Interventions     No data to display

## 2022-03-26 NOTE — Progress Notes (Signed)
  Transition of Care [facility]) Screening Note   Patient Details  Name: Jason Jones Date of Birth: 1950-09-01   Transition of Care [facility]) CM/SW Contact:    Geralynn Ochs, LCSW Phone Number: 03/26/2022, 1:05 PM    Transition of Care Department [facility]) has reviewed patient, from home alone and current recommendation for CIR, pending review. We will continue to monitor patient advancement through interdisciplinary progression rounds. If new patient transition needs arise, please place a TOC consult.

## 2022-03-27 LAB — BASIC METABOLIC PANEL
Anion gap: 11 (ref 5–15)
BUN: 22 mg/dL (ref 8–23)
CO2: 25 mmol/L (ref 22–32)
Calcium: 9.9 mg/dL (ref 8.9–10.3)
Chloride: 108 mmol/L (ref 98–111)
Creatinine, Ser: 1.16 mg/dL (ref 0.61–1.24)
GFR, Estimated: >60 mL/min (ref >60)
Glucose, Bld: 108 mg/dL — ABNORMAL HIGH (ref 70–99)
Potassium: 3.4 mmol/L — ABNORMAL LOW (ref 3.5–5.1)
Sodium: 144 mmol/L (ref 135–145)

## 2022-03-27 MED ORDER — POTASSIUM CHLORIDE 20 MEQ PO PACK
40.0000 meq | PACK | Freq: Four times a day (QID) | ORAL | Status: AC
  Administered 2022-03-27 (×2): 40 meq via ORAL
  Filled 2022-03-27 (×2): qty 2

## 2022-03-27 MED ORDER — POTASSIUM CHLORIDE 20 MEQ PO PACK
40.0000 meq | PACK | Freq: Two times a day (BID) | ORAL | Status: DC
  Administered 2022-03-27: 40 meq via ORAL
  Filled 2022-03-27: qty 2

## 2022-03-27 MED ORDER — NICOTINE 21 MG/24HR TD PT24
21.0000 mg | MEDICATED_PATCH | Freq: Every day | TRANSDERMAL | Status: DC
  Administered 2022-03-27 – 2022-03-28 (×2): 21 mg via TRANSDERMAL
  Filled 2022-03-27 (×2): qty 1

## 2022-03-27 MED ORDER — ENSURE ENLIVE PO LIQD
237.0000 mL | Freq: Two times a day (BID) | ORAL | Status: DC
  Administered 2022-03-27 – 2022-03-28 (×3): 237 mL via ORAL

## 2022-03-27 NOTE — Progress Notes (Signed)
Mobility Specialist: Progress Note   03/27/22 1715  Mobility  Activity Transferred from chair to bed  Level of Assistance +2 (takes two people)  Assistive Device Stedy  Activity Response Tolerated well  Mobility Referral Yes  $Mobility charge 1 Mobility   Pt received in the chair and assisted back to bed with help from RN and NT. ModA to stand. No c/o during transfer. Pt back in bed with call bell and phone in reach.   Jason Jones Mobility Specialist Secure Chat Only

## 2022-03-27 NOTE — TOC Progression Note (Signed)
Transition of Care [facility]) - Progression Note    Patient Details  Name: Atari Novick MRN: [id number] Date of Birth: November 29, 1950  Transition of Care [facility] CM/SW [place], [place] Phone Number: 03/27/2022, 2:38 PM  Clinical Narrative:   CSW spoke with daughter, [name], to discuss SNF and provide CMS choice list. [name] in agreement with SNF. CSW texted [place] choice list and [name] to review options available. CSW to follow.    Expected Discharge Plan: Skilled Nursing Facility Barriers to Discharge: Continued Medical Work up, Ship broker  Expected Discharge Plan and Services Expected Discharge Plan: [place] In-house Referral: Clinical Social Work Discharge Planning Services: CM Consult Post Acute Care Choice: IP Rehab Living arrangements for the past 2 months: Apartment                                       Social Determinants of Health (SDOH) Interventions    Readmission Risk Interventions     No data to display

## 2022-03-27 NOTE — Progress Notes (Addendum)
Inpatient Rehab Admissions Coordinator:    I met with Pt to discuss potential CIR admit. He is interested but unsure if he will have adequate support at discharge. I will reach out to family to see what support they can provide.   Clemens Catholic, [place], [place] Admissions Coordinator  [phone] ([place]) [phone] (office)

## 2022-03-27 NOTE — Progress Notes (Signed)
Inpatient Rehab Admissions Coordinator:    I spoke with pt.'s daughter and she states that she is not able to provide 24/7 support at d/c and Pt.'s only other family is his brother who is not able to assist physically, as he is older than Pt.  Daughter prefers SNF with option to transition to LTC. I will not pursue CIR for this pt I will notify TOC.   Clemens Catholic, [place], [place] Admissions Coordinator  [phone] ([place]) [phone] (office)

## 2022-03-27 NOTE — Progress Notes (Signed)
Orthopedic Tech Progress Note Patient Details:  Jason Jones 17-Apr-1951 [id number]  Ortho Devices Type of Ortho Device: Arm sling Ortho Device/Splint Location: LUE Ortho Device/Splint Interventions: Ordered, Application, Adjustment   Post Interventions Patient Tolerated: Well Instructions Provided: Care of device  Janit Pagan 03/27/2022, 4:40 PM

## 2022-03-27 NOTE — Care Management Important Message (Signed)
Important Message  Patient Details  Name: Jason Jones MRN: [id number] Date of Birth: 11-Jan-1951   Medicare Important Message Given:  Yes     [name] 03/27/2022, 2:59 PM

## 2022-03-27 NOTE — NC FL2 (Signed)
[place] LEVEL OF CARE SCREENING TOOL     IDENTIFICATION  Patient Name: Jason Jones Birthdate: August 19, 1950 Sex: male Admission Date (Current Location): 03/24/2022  [facility] and [place] Number:  Herbalist and Address:  [facility] [place] [address] [place], [place] [address]      Provider Number: [id number]  Attending Physician Name and Address:  Aldine Contes, MD  Relative Name and Phone Number:       Current Level of Care: Hospital Recommended Level of Care: [place] Prior Approval Number:    Date Approved/Denied:   PASRR Number: [id number]  Discharge Plan: SNF    Current Diagnoses: Patient Active Problem List   Diagnosis Date Noted   Acute ischemic right PCA stroke ([place]) 03/25/2022   Hypertension 03/25/2022   Dysphagia due to recent cerebrovascular accident (CVA) 03/25/2022   Elevated liver enzymes 03/25/2022   Rhabdomyolysis 03/25/2022   Malignant neoplasm of prostate ([place]) 02/20/2021    Orientation RESPIRATION BLADDER Height & Weight     Self, Time, Situation, Place  Normal Incontinent, Continent (incontinent at times) Weight:   Height:     BEHAVIORAL SYMPTOMS/MOOD NEUROLOGICAL BOWEL NUTRITION STATUS      Continent Diet (see DC summary)  AMBULATORY STATUS COMMUNICATION OF NEEDS Skin   Extensive Assist Verbally Normal                       Personal Care Assistance Level of Assistance  Bathing, Feeding, Dressing Bathing Assistance: Maximum assistance Feeding assistance: Limited assistance Dressing Assistance: Maximum assistance     Functional Limitations Info  Speech     Speech Info: Impaired (dysarthria)    SPECIAL CARE FACTORS FREQUENCY  PT (By licensed PT), OT (By licensed OT), Speech therapy     PT Frequency: 5x/wk OT Frequency: 5x/wk     Speech Therapy Frequency: 5x/wk      Contractures Contractures Info: Not present    Additional Factors Info  Code  Status, Allergies Code Status Info: Full Allergies Info: NKA           Current Medications (03/27/2022):  This is the current hospital active medication list Current Facility-Administered Medications  Medication Dose Route Frequency Provider Last Rate Last Admin   acetaminophen (TYLENOL) tablet 650 mg  650 mg Oral Q6H PRN Rick Duff, MD       Or   acetaminophen (TYLENOL) suppository 650 mg  650 mg Rectal Q6H PRN Rick Duff, MD       albuterol (PROVENTIL) (2.5 MG/3ML) 0.083% nebulizer solution 2.5 mg  2.5 mg Nebulization Q4H PRN Aldine Contes, MD   2.5 mg at 03/25/22 1914   amLODipine (NORVASC) tablet 5 mg  5 mg Oral Daily Lacinda Axon, MD   5 mg at 03/27/22 0946   aspirin chewable tablet 81 mg  81 mg Oral Daily Donnetta Simpers, MD   81 mg at 03/27/22 0946   clopidogrel (PLAVIX) tablet 75 mg  75 mg Oral Daily Donnetta Simpers, MD   75 mg at 03/27/22 0946   doxazosin (CARDURA) tablet 1 mg  1 mg Oral Daily Nani Gasser, MD   1 mg at 03/27/22 0946   enoxaparin (LOVENOX) injection 30 mg  30 mg Subcutaneous Q24H Rick Duff, MD   30 mg at 03/26/22 2023   feeding supplement (ENSURE ENLIVE / ENSURE PLUS) liquid 237 mL  237 mL Oral BID BM Nani Gasser, MD       food thickener (SIMPLYTHICK (  NECTAR/LEVEL 2/MILDLY THICK)) 1 packet  1 packet Oral PRN Narendra, Nischal, MD       lidocaine (XYLOCAINE) 2 % jelly 1 Application  1 Application Urethral Once Romana Juniper, MD       nicotine (NICODERM CQ - dosed in mg/24 hours) patch 21 mg  21 mg Transdermal Daily Nani Gasser, MD       potassium chloride (KLOR-CON) packet 40 mEq  40 mEq Oral Q6H Nani Gasser, MD       rosuvastatin (CRESTOR) tablet 20 mg  20 mg Oral Daily Nani Gasser, MD   20 mg at 03/27/22 4035     Discharge Medications: Please see discharge summary for a list of discharge medications.  Relevant Imaging Results:  Relevant Lab Results:   Additional  Information SS#: [id number]  Geralynn Ochs, LCSW

## 2022-03-27 NOTE — Progress Notes (Signed)
Physical Therapy Treatment Patient Details Name: Jason Jones MRN: [id number] DOB: August 14, 1950 Today's Date: 03/27/2022   History of Present Illness Pt is a 71 y/o male presenting on 10/15 with L sided weakness, fall 10/13.  MRI with Large R PCA CVA. PMH includes: alcohol use, prostate CA (2022) s/p external beam radiation (completed feb 2023), prior hepatitis C, HTN, tobacco use disorder.    PT Comments    Pt progressing towards physical therapy goals. Pt motivated to participate and eager for OOB mobility. Stedy utilized for optimal safety with standing activity. Pt was able to demonstrate corrective changes to L lateral lean both in sitting and standing, maintaining for short bouts. The mirror may be beneficial next session for visual feedback. Educated pt on positioning recommendations for LUE/LLE, attending to L side, providing tactile stimulus to L side, and how to utilize RLE to move LLE around in bed or to extend L knee. Pt receptive to all education and demonstrated with teach back. Feel he is becoming more aware of his deficits. Asked pt about visual changes and he endorsed feeling he is "missing" part of his vision which he did not endorse on eval. Noted that AIR denied and pt's family prefers SNF level rehab. Updated recommendations and frequency to reflect this change. Will continue to follow.     Recommendations for follow up therapy are one component of a multi-disciplinary discharge planning process, led by the attending physician.  Recommendations may be updated based on patient status, additional functional criteria and insurance authorization.  Follow Up Recommendations  Skilled nursing-short term rehab (<3 hours/day) Can patient physically be transported by private vehicle: No   Assistance Recommended at Discharge Frequent or constant Supervision/Assistance  Patient can return home with the following Two people to help with walking and/or transfers;Two people to help with  bathing/dressing/bathroom;Assistance with cooking/housework;Assist for transportation;Help with stairs or ramp for entrance   Equipment Recommendations  Other (comment) (TBD by next venue of care)    Recommendations for Other Services       Precautions / Restrictions Precautions Precautions: Fall Precaution Comments: L hemiparesis, R hemianopsia Restrictions Weight Bearing Restrictions: No     Mobility  Bed Mobility Overal bed mobility: Needs Assistance Bed Mobility: Supine to Sit     Supine to sit: Mod assist, +2 for physical assistance, +2 for safety/equipment     General bed mobility comments: VC's for sequencing throughout. Pt was able to reach across with RUE for the railing on the L and assist with transition to EOB. Bed pad utilized for pivot around to fully sit EOB.    Transfers Overall transfer level: Needs assistance Equipment used: 2 person hand held assist Transfers: Sit to/from Stand, Bed to chair/wheelchair/BSC Sit to Stand: Max assist, +2 physical assistance, +2 safety/equipment           General transfer comment: Pt pulling to stand from center bar of Stedy. +2 mod assist initially and required +2 max assist to complete full power up to stand and fully extend hips. Pt able to correct L lateral lean in standing with cues but unable to maintain. Transfer via Lift Equipment: Stedy  Ambulation/Gait               General Gait Details: Unable to progress to gait training at this time.   Stairs             Wheelchair Mobility    Modified Rankin (Stroke Patients Only) Modified Rankin (Stroke Patients Only) Pre-Morbid Rankin Score: No symptoms  Modified Rankin: Severe disability     Balance Overall balance assessment: Needs assistance Sitting-balance support: No upper extremity supported, Feet supported, Single extremity supported Sitting balance-Leahy Scale: Poor Sitting balance - Comments: preference to R UE support, without UE support  max assist fading to min with a few bouts of min guard for approx 30 seconds Postural control: Posterior lean, Left lateral lean Standing balance support: Bilateral upper extremity supported, During functional activity Standing balance-Leahy Scale: Zero Standing balance comment: +2 assist required                            Cognition Arousal/Alertness: Awake/alert Behavior During Therapy: WFL for tasks assessed/performed Overall Cognitive Status: Impaired/Different from baseline Area of Impairment: Awareness, Problem solving, Orientation, Memory, Following commands, Attention, Safety/judgement                 Orientation Level: Disoriented to, Time Current Attention Level: Sustained Memory: Decreased short-term memory Following Commands: Follows one step commands consistently, Follows one step commands with increased time, Follows multi-step commands inconsistently Safety/Judgement: Decreased awareness of safety, Decreased awareness of deficits Awareness: Emergent Problem Solving: Slow processing, Difficulty sequencing, Requires verbal cues, Decreased initiation          Exercises      General Comments        Pertinent Vitals/Pain Pain Assessment Pain Assessment: No/denies pain    Home Living                          Prior Function            PT Goals (current goals can now be found in the care plan section) Acute Rehab PT Goals Patient Stated Goal: Eventually return to his home PT Goal Formulation: With patient/family Time For Goal Achievement: 04/09/22 Potential to Achieve Goals: Good Progress towards PT goals: Progressing toward goals    Frequency    Min 2X/week      PT Plan Discharge plan needs to be updated;Frequency needs to be updated    Co-evaluation              AM-PAC PT "6 Clicks" Mobility   Outcome Measure  Help needed turning from your back to your side while in a flat bed without using bedrails?:  Total Help needed moving from lying on your back to sitting on the side of a flat bed without using bedrails?: Total Help needed moving to and from a bed to a chair (including a wheelchair)?: Total Help needed standing up from a chair using your arms (e.g., wheelchair or bedside chair)?: Total Help needed to walk in hospital room?: Total Help needed climbing 3-5 steps with a railing? : Total 6 Click Score: 6    End of Session Equipment Utilized During Treatment: Gait belt Activity Tolerance: Patient tolerated treatment well Patient left: in chair;with chair alarm set;with call bell/phone within reach Nurse Communication: Mobility status;Need for lift equipment [facility] for back to bed; O2 status) PT Visit Diagnosis: Unsteadiness on feet (R26.81)     Time: 1440-1520 PT Time Calculation (min) (ACUTE ONLY): 40 min  Charges:  $Gait Training: 38-52 mins                     Rolinda Roan, PT, DPT Acute Rehabilitation Services Secure Chat Preferred Office: [phone]    Thelma Comp 03/27/2022, 4:07 PM

## 2022-03-27 NOTE — Progress Notes (Addendum)
Subjective:   Hospital day 3.  Interval events: Indwelling catheter placed for urinary retention.  Patient reports no improvement in his left-sided hemiparesis. He continues to try to move the fingers on his left hand. He wonders when his strength will return. He denies abdominal pain. Urinary catheter was uncomfortable when it was being placed but now it does not bother him.  Objective:  Vital signs: Blood pressure 139/83, pulse 80, temperature 98.7 F (37.1 C), temperature source Oral, resp. rate 18, SpO2 96 %.  Physical exam: Constitutional: Semireclined in bed.  No apparent distress. Cardiovascular: Regular rate and rhythm. Pulmonary: Normal work of breathing.  Anterior lung fields clear. Skin: Warm and dry. Neuro: Alert and follows simple instructions.  Left-sided hemiparesis.  Left-sided facial droop.  Dysarthric speech.   Intake/Output Summary (Last 24 hours) at 03/27/2022 1046 Last data filed at 03/27/2022 0531 Gross per 24 hour  Intake 260 ml  Output 1925 ml  Net -1665 ml    Pertinent Labs:    Latest Ref Rng & Units 03/26/2022    3:36 AM 03/25/2022    4:13 AM 03/24/2022    3:50 PM  CBC  WBC 4.0 - 10.5 K/uL 6.3  7.0    Hemoglobin 13.0 - 17.0 g/dL 12.5  12.8  14.6   Hematocrit 39.0 - 52.0 % 37.6  40.4  43.0   Platelets 150 - 400 K/uL 243  255         Latest Ref Rng & Units 03/27/2022    3:32 AM 03/26/2022    3:36 AM 03/25/2022    4:13 AM  CMP  Glucose 70 - 99 mg/dL 108  103  101   BUN 8 - 23 mg/dL '22  22  26   '$ Creatinine 0.61 - 1.24 mg/dL 1.16  1.01  0.90   Sodium 135 - 145 mmol/L 144  144  145   Potassium 3.5 - 5.1 mmol/L 3.4  3.3  4.3   Chloride 98 - 111 mmol/L 108  113  115   CO2 22 - 32 mmol/L '25  24  21   '$ Calcium 8.9 - 10.3 mg/dL 9.9  9.2  8.6   Total Protein 6.5 - 8.1 g/dL  6.3  5.9   Total Bilirubin 0.3 - 1.2 mg/dL  0.5  1.0   Alkaline Phos 38 - 126 U/L  38  35   AST 15 - 41 U/L  84  123   ALT 0 - 44 U/L  69  70     Assessment/Plan:   Principal Problem:   Acute ischemic right PCA stroke (HCC) Active Problems:   Hypertension   Dysphagia due to recent cerebrovascular accident (CVA)   Elevated liver enzymes   Rhabdomyolysis   Patient Summary: Jason Jones is a 71 y.o. with a PMH of hypertension, tobacco use disorder, and prostate cancer, who presented with  left-sided hemiparesis, left-sided facial droop, dysarthria, and was found to have a acute right PCA CVA.   Right PCA territory infarct with right P1 occlusion, likely due to large vessel disease Left-sided hemiparesis Oropharyngeal dysphagia Neurologic exam grossly unchanged.  On dysphagia 1 diet.  He ate half of his breakfast today.  We will try protein shakes as well.  Zio patch on discharge to rule out A-fib and cardioembolic etiology.  Not a candidate for CIR because of lack of support in the outpatient setting.  Search for SNF  is ongoing.  For large vessel thromboembolic stroke, will treat with an extended course of DAPT. - Continue Plavix and aspirin for 3 months, then aspirin alone - Continue rosuvastatin daily - Continue telemetry - Start Ensure feeding supplements - PT/OT/SLP  Urinary retention History of BPH.  Was having trouble urinating yesterday.  Serial bladder scans with large volume of urine.  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  Status post indwelling catheter placement overnight. - Can continue doxazosin 1 milligram daily for BPH  Hypertension Blood pressure under good control.  983J to 825K systolic. - Continue amlodipine 5 mg daily  Tobacco use disorder Patient is a heavy smoker, using 2 packs/day for 30+ years. - Start nicotine patch  Diet:  Dysphagia 1 VTE: Enoxaparin Code: Full TOC recs: Pending SNF placement.  Dispo: Medically stable for discharge.  Pending SNF placement.  Nani Gasser MD 03/27/2022, 10:46 AM  Pager: [phone] After 5pm on weekdays and 1pm on  weekends: On Call pager: [phone]

## 2022-03-28 ENCOUNTER — Other Ambulatory Visit: Payer: Self-pay | Admitting: Cardiology

## 2022-03-28 DIAGNOSIS — I639 Cerebral infarction, unspecified: Secondary | ICD-10-CM

## 2022-03-28 LAB — BASIC METABOLIC PANEL
Anion gap: 7 (ref 5–15)
BUN: 18 mg/dL (ref 8–23)
CO2: 27 mmol/L (ref 22–32)
Calcium: 9 mg/dL (ref 8.9–10.3)
Chloride: 106 mmol/L (ref 98–111)
Creatinine, Ser: 0.95 mg/dL (ref 0.61–1.24)
GFR, Estimated: >60 mL/min (ref >60)
Glucose, Bld: 113 mg/dL — ABNORMAL HIGH (ref 70–99)
Potassium: 3.3 mmol/L — ABNORMAL LOW (ref 3.5–5.1)
Sodium: 140 mmol/L (ref 135–145)

## 2022-03-28 MED ORDER — DOXAZOSIN MESYLATE 1 MG PO TABS: 1.0000 mg | ORAL_TABLET | Freq: Every day | ORAL | Status: DC

## 2022-03-28 MED ORDER — ENSURE ENLIVE PO LIQD: 237.0000 mL | Freq: Two times a day (BID) | ORAL | 12 refills | Status: DC

## 2022-03-28 MED ORDER — AMLODIPINE BESYLATE 5 MG PO TABS: 5.0000 mg | ORAL_TABLET | Freq: Every day | ORAL | Status: DC

## 2022-03-28 MED ORDER — ASPIRIN 81 MG PO CHEW: 81.0000 mg | CHEWABLE_TABLET | Freq: Every day | ORAL | Status: DC

## 2022-03-28 MED ORDER — POTASSIUM CHLORIDE 20 MEQ PO PACK
40.0000 meq | PACK | Freq: Three times a day (TID) | ORAL | Status: AC
  Administered 2022-03-28 (×3): 40 meq via ORAL
  Filled 2022-03-28 (×3): qty 2

## 2022-03-28 MED ORDER — NICOTINE 21 MG/24HR TD PT24: 21.0000 mg | MEDICATED_PATCH | Freq: Every day | TRANSDERMAL | 0 refills | Status: DC

## 2022-03-28 MED ORDER — CLOPIDOGREL BISULFATE 75 MG PO TABS: 75.0000 mg | ORAL_TABLET | Freq: Every day | ORAL | 2 refills | Status: DC

## 2022-03-28 MED ORDER — ALBUTEROL SULFATE (2.5 MG/3ML) 0.083% IN NEBU: 2.5000 mg | INHALATION_SOLUTION | RESPIRATORY_TRACT | 12 refills | Status: AC | PRN

## 2022-03-28 MED ORDER — FOOD THICKENER (SIMPLYTHICK): 1.0000 | ORAL | Status: DC | PRN

## 2022-03-28 MED ORDER — ROSUVASTATIN CALCIUM 20 MG PO TABS: 20.0000 mg | ORAL_TABLET | Freq: Every day | ORAL | Status: AC

## 2022-03-28 NOTE — Discharge Instructions (Addendum)
[name]  You were admitted for a stroke. You were treated with a combination of medicines that you will continue after you leave the hospital. You were also treated with physical and occupational therapy. Your therapy will be continued in a rehabilitation facility. We are discharging you home now that you are doing better. To help assist you on your road to recovery, I have written the following recommendations:   Preventing future strokes is an important priority. To do this, continue the following medications: - Clopidogrel (Plavix) 1 tablet daily until June 25, 2022. - Aspirin 1 tablet daily indefinitely - Rosuvastatin 1 tablet daily indefinitely  Blood pressure control is an important part of preventing strokes.  Continue the following medications: - Amlodipine 5 mg daily  Smoking cessation is also very important to prevent future strokes.  If you need help to stop smoking, please talk to your doctor.  For your urinary retention, I have arranged a follow-up appointment with a urologist.  Please go to [facility] on 04/17/2022 for an appointment at 8:15 AM.  It was a privilege to be a part of your hospital care team, and I hope you feel better as a result of your stay.  All the best, Nani Gasser, MD

## 2022-03-28 NOTE — Progress Notes (Signed)
30 day event monitor ordered to rule out afib as cause of CVA. Dr. Sallyanne Kuster to read.

## 2022-03-28 NOTE — Progress Notes (Signed)
Report given to [facility] of [facility]. Awaiting PTAR.

## 2022-03-28 NOTE — Progress Notes (Addendum)
Attempted to call report to [facility], no answer will try again later.  Attempted twice more once at 17:29, and once at shift change without success.

## 2022-03-28 NOTE — TOC Transition Note (Signed)
Transition of Care [facility]) - CM/SW Discharge Note   Patient Details  Name: Jason Jones MRN: [id number] Date of Birth: 24-May-1951  Transition of Care [facility]) CM/SW Contact:  Geralynn Ochs, LCSW Phone Number: 03/28/2022, 3:35 PM   Clinical Narrative:   CSW received insurance approval for patient, and [facility] able to admit today. CSW sent discharge information to [facility]. Daughter, [name], updated and agreeable to transfer today. Transport scheduled with PTAR for next available.  Nurse to call report to [phone], Room 104.    Final next level of care: Skilled Nursing Facility Barriers to Discharge: Barriers Resolved   Patient Goals and CMS Choice   CMS Medicare.gov Compare Post Acute Care list provided to:: Patient Choice offered to / list presented to : Patient  Discharge Placement              Patient chooses bed at: [facility] Patient to be transferred to facility by: [facility] Name of family member notified: [name] Patient and family notified of of transfer: 03/28/22  Discharge Plan and Services In-house Referral: Clinical Social Work Discharge Planning Services: CM Consult Post Acute Care Choice: IP Rehab                               Social Determinants of Health (SDOH) Interventions     Readmission Risk Interventions     No data to display

## 2022-03-28 NOTE — TOC Progression Note (Signed)
Transition of Care [facility]) - Progression Note    Patient Details  Name: Jason Jones MRN: [id number] Date of Birth: [birth year]  Transition of Care [facility]) CM/SW [place], [place] Phone Number: 03/28/2022, 9:43 AM  Clinical Narrative:   CSW updated by daughter that she'd like to choose [facility]. CSW confirmed with [facility] that they would have bed available for patient. CSW initiated insurance authorization request. CSW to follow.    Expected Discharge Plan: Skilled Nursing Facility Barriers to Discharge: Continued Medical Work up, Ship broker  Expected Discharge Plan and Services Expected Discharge Plan: [place] In-house Referral: Clinical Social Work Discharge Planning Services: CM Consult Post Acute Care Choice: IP Rehab Living arrangements for the past 2 months: Apartment                                       Social Determinants of Health (SDOH) Interventions    Readmission Risk Interventions     No data to display

## 2022-03-28 NOTE — Progress Notes (Signed)
Speech Language Pathology Treatment: Cognitive-Linquistic;Dysphagia  Patient Details Name: Jason Jones MRN: [id number] DOB: January 12, 1951 Today's Date: 03/28/2022 Time: 9407-6808 SLP Time Calculation (min) (ACUTE ONLY): 23 min  Assessment / Plan / Recommendation Clinical Impression  Pt was seen for treatment. He was alert and cooperative during the session. He was educated regarding the nature of dysarthria, and compensatory strategies to improve speech intelligibility. Pt verbalized understanding regarding all areas of education. He used compensatory strategies at the word level with 75% accuracy given intermittent prompts and models. He achieved 50% accuracy at the phrase level increasing to 100% with prompts for vocal intensity and overarticulation. A medication management task was stated, but then discontinued to allow pt to engage with a visitor who arrived towards the end of the session. Some generalization of increased vocal intensity was noted during conversation with his friend and this improved speech intelligibility. Pt stated that he has been managing the puree solids better than dysphagia 2/3 and that he would like to remain on this diet. Pt was seen with part of lunch since he was eating unsupervised upon SLP's entry. With support, pt was able to reduce bolus size and use chin tuck posture, but frequent prompts were needed for the latter. Coughing was intermittently noted with consecutive swallows of nectar thick liquids via straw, but not with individual boluses or when a chin tuck was used while consuming consecutive swallows. SLP will continue to follow pt.     HPI HPI: Pt is a 71 yo male wtih acute onset L sided weakness and fall 10/13, found down  and brought in by EMS 10/15. MRI revealed large R PCA CVA. PMH includes: GERD, alcohol use, prostate ca (2022) s/p external beam radiation (completed Feb 2023), prior Hepatitis C, HTN, tobacco use disorder      SLP Plan  Continue with  current plan of care      Recommendations for follow up therapy are one component of a multi-disciplinary discharge planning process, led by the attending physician.  Recommendations may be updated based on patient status, additional functional criteria and insurance authorization.    Recommendations  Diet recommendations: Dysphagia 1 (puree);Nectar-thick liquid Liquids provided via: Cup;Straw Medication Administration: Whole meds with puree Supervision: Staff to assist with self feeding;Full supervision/cueing for compensatory strategies Compensations: Minimize environmental distractions;Slow rate;Small sips/bites Postural Changes and/or Swallow Maneuvers: Seated upright 90 degrees                Oral Care Recommendations: Oral care BID Follow Up Recommendations: Skilled nursing-short term rehab (<3 hours/day) Assistance recommended at discharge: Frequent or constant Supervision/Assistance SLP Visit Diagnosis: Dysphagia, oropharyngeal phase (R13.12) Plan: Continue with current plan of care         Jason Jones I. Hardin Negus, [place], [place] Office number [phone]  Jason Jones  03/28/2022, 3:09 PM

## 2022-03-28 NOTE — Progress Notes (Signed)
Mobility Specialist: Progress Note   03/28/22 1558  Mobility  Activity Transferred from chair to bed  Level of Assistance +2 (takes two people)  Assistive Device Stedy  Activity Response Tolerated well  Mobility Referral Yes  $Mobility charge 1 Mobility   Pt assisted back to bed per request. ModA +2 and assisted with pericare with help from NT. Pt back to bed with NT and RN present in the room.   [place] Uziah Sorter Mobility Specialist Secure Chat Only

## 2022-03-28 NOTE — Progress Notes (Signed)
Mobility Specialist: Progress Note   03/28/22 1128  Mobility  Activity Transferred from bed to chair  Level of Assistance +2 (takes two people)  Assistive Device Stedy  Activity Response Tolerated well  Mobility Referral Yes  $Mobility charge 1 Mobility   Pt received in the bed and agreeable to mobility. Required maxA for bed mobility and +2 modA to stand in the stedy. Utilized sling for L arm for safety. Pt with heavy L lateral lean requiring modA to correct. STS x2 to be assisted with pericare with help from NT, then transferred to the chair. Pt has call bell and phone in reach.   [place] Giavanni Zeitlin Mobility Specialist Secure Chat Only

## 2022-03-28 NOTE — Discharge Summary (Signed)
Name: Jason Jones MRN: [id number] DOB: Apr 16, 1951 71 y.o. PCP: Arthur Holms, NP  Date of Admission: 03/24/2022  2:50 PM Date of Discharge: 03/28/2022 2:27 PM Attending Physician: Aldine Contes, MD  Discharge Diagnosis: Principal Problem:   Acute ischemic right PCA stroke [facility]) Active Problems:   Hypertension   Dysphagia due to recent cerebrovascular accident (CVA)   Elevated liver enzymes   Rhabdomyolysis   Discharge Medications: Allergies as of 03/28/2022       Reactions   No Known Allergies         Medication List     STOP taking these medications    meloxicam 15 MG tablet Commonly known as: MOBIC   tamsulosin 0.4 MG Caps capsule Commonly known as: FLOMAX       TAKE these medications    albuterol (2.5 MG/3ML) 0.083% nebulizer solution Commonly known as: PROVENTIL Take 3 mLs (2.5 mg total) by nebulization every 4 (four) hours as needed for wheezing or shortness of breath.   amLODipine 5 MG tablet Commonly known as: NORVASC Take 1 tablet (5 mg total) by mouth daily. Start taking on: March 29, 2022 What changed:  medication strength how much to take   aspirin 81 MG chewable tablet Chew 1 tablet (81 mg total) by mouth daily. Start taking on: March 29, 2022   clopidogrel 75 MG tablet Commonly known as: PLAVIX Take 1 tablet (75 mg total) by mouth daily. Take 1 tablet by mouth until 06/25/22 to complete a 3 month course. Start taking on: March 29, 2022   doxazosin 1 MG tablet Commonly known as: CARDURA Take 1 tablet (1 mg total) by mouth daily. For BPH Start taking on: March 29, 2022   feeding supplement Liqd Take 237 mLs by mouth 2 (two) times daily between meals.   food thickener Gel Commonly known as: SIMPLYTHICK (NECTAR/LEVEL 2/MILDLY THICK) Take 1 packet by mouth as needed.   nicotine 21 mg/24hr patch Commonly known as: NICODERM CQ - dosed in mg/24 hours Place 1 patch (21 mg total) onto the skin daily. Start taking on:  March 29, 2022   rosuvastatin 20 MG tablet Commonly known as: CRESTOR Take 1 tablet (20 mg total) by mouth daily. Start taking on: March 29, 2022        Disposition and follow-up:   Jason Jones is a 71 y.o. year old male admitted to [facility] for acute CVA due to occlusion of P1 branch of right PCA. They were discharged from [facility] on hospital day 4 in Stable condition.  At the hospital follow up visit please address:  Acute CVA LVO. Not a candidate for thrombolytics or thrombectomy due to presentation outside of therapeutic window. L sided hemiparesis, oropharyngeal dysphagia, dysarthria as a result. Also may have new neurogenic bladder as patient was unable to void while hospitalized. Please ensure adherence to antiplatelet therapy, antihypertensives. Also encourage smoking cessation - Plavix through 06/25/21 - Aspirin indefinitely - Follow up with [facility] - [facility] to rule out atrial fibrillation - Recommend discontinuation of meloxicam at discharge  Urinary retention History of BPH. 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 Discharged with indwelling catheter in place. Will follow up with [facility] for trial of void. Tamsulosin switched to doxazosin because of patient's dysphagia, requiring medications to be crushed/broken. - Doxazosin 1 mg daily  Abnormal thyroid tests TSH of 0.299. - Repeat studies  Labs / imaging needed at time of follow-up: - TSH - C[facility] - [facility]  Follow-up Appointments:  Follow-up Information     [employer] for Medicaid transport Follow up.   Why: Call to arrange transportation. They need 2-3 days notice to arrange. Contact information: [phone]        [facility]. Schedule an appointment as soon as possible for a visit in 1 month(s).   Specialty: Neurology Why: stroke clinic Contact information: [address] [place]. Go on 04/17/2022.   Why: Please arrive 15 minutes early for your appointment at 8:15 AM. Contact information: [place] Hospital Course by problem list:  Patient 71 year old male with history of hypertension, tobacco use disorder, prostate cancer, who presented with left-sided hemiparesis, left-sided facial droop, dysarthria, and was found to have an acute right PCA CVA.  Right PCA territory infarct with right P1 occlusion, likely due to large vessel disease Left-sided hemiparesis Oropharyngeal dysphagia Was found down on the floor of his home.  Had been there for 2 days after acute onset of left-sided weakness that caused him to fall off of his couch.  NIH stroke scale 13 on arrival.  Imaging showed large acute infarct in right PCA territory.  Angiogram showed occlusion of right PCA P1 segment.  No thrombolytics or thrombectomy indicated as patient was outside the window for both.  He was admitted for work-up and treatment of acute stroke.  Condition remained stable.  He received physical, occupational, and speech therapy while he was hospitalized.  They recommended SNF for rehab.  He was discharged on a regimen of aspirin, Plavix, and rosuvastatin.  Urinary retention History of BPH.  Reported trouble urinating on hospital day 2.  Serial bladder scans showed large volume of urine.  Thought to be secondary to new neurogenic bladder complication of stroke.  Indwelling catheter was placed.  Patient was discharged with urology follow-up in the outpatient setting.  Severe hypertension Initially presented with systolics to 841Y.  Thought to be poorly controlled for some time prior to his stroke.  On low-dose of amlodipine prior to admission.  Was started on amlodipine 5 mg daily to good effect while  hospitalized.  AKI Increased liver enzymes Rhabdomyolysis All thought to be due to prolonged downtime at home.  Resolved rapidly over the course of this hospitalization.  Discharge Exam:  Subjective: Feels well today.  Still weak on the left side.  No new complaints.  Discussed smoking cessation, blood pressure control, and rehabilitation after discharge.   Blood pressure 108/64, pulse 70, temperature 98.1 F (36.7 C), temperature source Oral, resp. rate 20, SpO2 100 %. General: Sitting in chair.  Alert. Cardiovascular: Regular rate and rhythm.  Left radial pulse 2+. Respiratory: Anterior lung fields clear to auscultation.  Normal work of breathing. Skin: Warm and dry. Neuro: Gross left-sided hemiparesis.  Dysarthric speech.  Facial asymmetry.  Pertinent studies and procedures:   Latest Reference Range & Units 03/28/22 03:27  Sodium 135 - 145 mmol/L 140  Potassium 3.5 - 5.1 mmol/L 3.3 (L)  Chloride 98 - 111 mmol/L 106  CO2 22 - 32 mmol/L 27  Glucose 70 - 99 mg/dL 113 (H)  BUN 8 - 23 mg/dL 18  Creatinine 0.61 - 1.24 mg/dL 0.95  Calcium 8.9 - 10.3 mg/dL 9.0  Anion gap 5 - 15  7  (  L): Data is abnormally low (H): Data is abnormally high   Latest Reference Range & Units 03/26/22 03:36  Alkaline Phosphatase 38 - 126 U/L 38  Albumin 3.5 - 5.0 g/dL 3.1 (L)  AST 15 - 41 U/L 84 (H)  ALT 0 - 44 U/L 69 (H)  Total Protein 6.5 - 8.1 g/dL 6.3 (L)  Total Bilirubin 0.3 - 1.2 mg/dL 0.5  (L): Data is abnormally low (H): Data is abnormally high   Latest Reference Range & Units 03/24/22 15:21 03/25/22 00:11 03/25/22 04:13 03/26/22 03:36  CK Total 49 - 397 U/L 8,094 (H) 4,695 (H) 3,695 (H) 1,712 (H)  (H): Data is abnormally high   Latest Reference Range & Units 03/26/22 03:36  WBC 4.0 - 10.5 K/uL 6.3  RBC 4.22 - 5.81 MIL/uL 4.14 (L)  Hemoglobin 13.0 - 17.0 g/dL 12.5 (L)  HCT 39.0 - 52.0 % 37.6 (L)  MCV 80.0 - 100.0 fL 90.8  MCH 26.0 - 34.0 pg 30.2  MCHC 30.0 - 36.0 g/dL 33.2  RDW 11.5  - 15.5 % 14.2  Platelets 150 - 400 K/uL 243  nRBC 0.0 - 0.2 % 0.0  (L): Data is abnormally low  Modified barium swallow study: Pt presents with oropharyngeal dysphagia characterized by a pharyngeal delay and reduction in labial seal, poterior propulsion, tongue base retraction, and bolus cohesion. Pt exhibited vallecular residue, difficulty with A-P transport (regular texture solids and of the barium tablet), and liquid boluses were often triggered with the head of the bolus at the level of the pyriform sinuses. Pt demonstrated penetration (PAS 3) with thin liquids via cup when boluses were administered by the SLP, but this progressed to PAS 5 and intermittently to aspiration (PAS 7) when pt self-fed. A single instance of aspiration (PAS 7) was noted with a large bolus of nectar thick liquids, due to spillover of prematurely spilled liquid in the pyriform sinuses, but this could not be replicated. Laryngeal invasion was eliminated with use of smaller bolus sizes combined with a chin tuck posture. However, often due to impulsivity, pt consistently demonstrated difficulty implementing one or both of these. A dysphagia 3 diet with nectar thick liquids will be initiated at this time. However, SLP is hopeful that the pt will be able to advanced to thin liquids this week with training for use of compensatory strategies. SLP will continue to follow pt.   MRI/MRA head/neck IMPRESSION: 1. 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 No hemorrhage or mass effect. 2. Occlusion of the right PCA P1 segment. 3. 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   Discharge Instructions      Jason Jones  You were admitted for a stroke. You were treated with a combination of medicines that you will continue after you leave the hospital. You were also treated with physical and occupational therapy. Your therapy will be  continued in a rehabilitation facility. We are discharging you home now that you are doing better. To help assist you on your road to recovery, I have written the following recommendations:   Preventing future strokes is an important priority. To do this, continue the following medications: - Clopidogrel (Plavix) 1 tablet daily until June 25, 2022. - Aspirin 1 tablet daily indefinitely - Rosuvastatin 1 tablet daily indefinitely  Blood pressure control is an important part of preventing strokes.  Continue the following medications: - Amlodipine 5 mg daily  Smoking cessation  is also very important to prevent future strokes.  If you need help to stop smoking, please talk to your doctor.  For your urinary retention, I have arranged a follow-up appointment with a urologist.  Please go to [facility] on 04/17/2022 for an appointment at 8:15 AM.  It was a privilege to be a part of your hospital care team, and I hope you feel better as a result of your stay.  All the best, Nani Gasser, MD     Signed: Nani Gasser MD 03/28/2022, 2:27 PM   Pager: [phone]

## 2022-03-28 NOTE — Progress Notes (Signed)
Occupational Therapy Treatment Patient Details Name: Jason Jones MRN: [id number] DOB: 07-30-1950 Today's Date: 03/28/2022   History of present illness Pt is a 71 y/o male presenting on 10/15 with L sided weakness, fall 10/13.  MRI with Large R PCA CVA. PMH includes: alcohol use, prostate CA (2022) s/p external beam radiation (completed feb 2023), prior hepatitis C, HTN, tobacco use disorder.   OT comments  Pt supine in bed and agreeable to OT session. Sling ON upon entry, reports wearing it all night.  Therapist removed and educated to only wear during transfers to protect UE.  Patient engaged in supine L UE PROM exercises, AAROM to shoulder elevation- reports not completing elevation exercises as instructed last session.  Visually, patient with significant L visual field deficits with pt scanning to compensate, but only locating items on L side once reaching midline. Requires mod assist for bed mobility, but min-max assist for dynamic sitting balance without UE support at EOB with L posterior losses.  Worked on L lateral leans, ADLs at EOB.  Setup to wash face supine, total assist for LB dressing due to L hemiparesis, impaired balance, and cognition.  Will follow acutely, updated dc plan to SNF.    Recommendations for follow up therapy are one component of a multi-disciplinary discharge planning process, led by the attending physician.  Recommendations may be updated based on patient status, additional functional criteria and insurance authorization.    Follow Up Recommendations  Skilled nursing-short term rehab (<3 hours/day)    Assistance Recommended at Discharge Frequent or constant Supervision/Assistance  Patient can return home with the following  Two people to help with walking and/or transfers;Two people to help with bathing/dressing/bathroom;Assistance with cooking/housework;Direct supervision/assist for medications management;Direct supervision/assist for financial management;Assist  for transportation;Help with stairs or ramp for entrance;Assistance with feeding   Equipment Recommendations  Other (comment) (defer)    Recommendations for Other Services      Precautions / Restrictions Precautions Precautions: Fall Precaution Comments: L hemiparesis, L hemianopsia Restrictions Weight Bearing Restrictions: No       Mobility Bed Mobility Overal bed mobility: Needs Assistance Bed Mobility: Supine to Sit, Sit to Sidelying, Rolling Rolling: Min assist, Max assist   Supine to sit: Mod assist, HOB elevated   Sit to sidelying: Mod assist General bed mobility comments: cueing to sequence, using R LE to assist L LE towards EOB then mod assist to elevate trunk. Returning to supine with trunk and LB support (LLE) via sidelying. ROlling in bed with min assist towards L side but max assist towards R side.    Transfers                   General transfer comment: deferred +1 assist only     Balance Overall balance assessment: Needs assistance Sitting-balance support: No upper extremity supported, Feet supported Sitting balance-Leahy Scale: Poor Sitting balance - Comments: preference to R UE support, without UE support min to mod assist but very limited dynamically Postural control: Posterior lean, Left lateral lean                                 ADL either performed or assessed with clinical judgement   ADL Overall ADL's : Needs assistance/impaired     Grooming: Set up;Bed level;Moderate assistance Grooming Details (indicate cue type and reason): to wash face, mod assist required for bimanual tasks due to L hemiparesis  Upper Body Dressing : Moderate assistance;Sitting   Lower Body Dressing: Total assistance;Sitting/lateral leans;Bed level     Toilet Transfer Details (indicate cue type and reason): deferred                Extremity/Trunk Assessment              Vision   Vision Assessment?: Yes Eye Alignment:  Within Functional Limits Ocular Range of Motion: Within Functional Limits Alignment/Gaze Preference: Within Defined Limits Tracking/Visual Pursuits: Able to track stimulus in all quads without difficulty;Other (comment) (cueing to avoid head turns) Visual Fields: Left visual field deficit Additional Comments: vision tested bilaterally only, noted L visual deficit and poor awareness to deficit- only seeing stimuli on L side when reaching midline   Perception     Praxis      Cognition Arousal/Alertness: Awake/alert Behavior During Therapy: WFL for tasks assessed/performed Overall Cognitive Status: Impaired/Different from baseline Area of Impairment: Awareness, Problem solving, Orientation, Memory, Following commands, Attention, Safety/judgement                   Current Attention Level: Sustained Memory: Decreased recall of precautions, Decreased short-term memory Following Commands: Follows one step commands consistently, Follows one step commands with increased time, Follows multi-step commands inconsistently Safety/Judgement: Decreased awareness of safety, Decreased awareness of deficits Awareness: Emergent Problem Solving: Slow processing, Difficulty sequencing, Requires verbal cues, Requires tactile cues, Decreased initiation General Comments: pt with decreased awareness of deficits and safety, slow processing and requires cueing for sequencing.        Exercises Exercises: Other exercises Other Exercises Other Exercises: L shoulder elevation x 5 reps AAROM Other Exercises: PROM to L UE from shoulder to hand, remains flaccid    Shoulder Instructions       General Comments      Pertinent Vitals/ Pain       Pain Assessment Pain Assessment: No/denies pain  Home Living                                          Prior Functioning/Environment              Frequency  Min 2X/week        Progress Toward Goals  OT Goals(current goals can  now be found in the care plan section)  Progress towards OT goals: Progressing toward goals  Acute Rehab OT Goals Patient Stated Goal: get better OT Goal Formulation: With patient Time For Goal Achievement: 04/09/22 Potential to Achieve Goals: Good  Plan Discharge plan needs to be updated;Frequency remains appropriate    Co-evaluation                 AM-PAC OT "6 Clicks" Daily Activity     Outcome Measure   Help from another person eating meals?: A Little Help from another person taking care of personal grooming?: A Lot Help from another person toileting, which includes using toliet, bedpan, or urinal?: Total Help from another person bathing (including washing, rinsing, drying)?: A Lot Help from another person to put on and taking off regular upper body clothing?: A Lot Help from another person to put on and taking off regular lower body clothing?: Total 6 Click Score: 11    End of Session    OT Visit Diagnosis: Other abnormalities of gait and mobility (R26.89);Muscle weakness (generalized) (M62.81);Hemiplegia and hemiparesis;Other symptoms and signs involving cognitive function;History of  falling (Z91.81) Hemiplegia - Right/Left: Left Hemiplegia - dominant/non-dominant: Non-Dominant Hemiplegia - caused by: Cerebral infarction   Activity Tolerance Patient tolerated treatment well   Patient Left in bed;with call bell/phone within reach;with bed alarm set   Nurse Communication Mobility status;Other (comment);Need for lift equipment (breakfast)        Time: 5146-0479 OT Time Calculation (min): 25 min  Charges: OT General Charges $OT Visit: 1 Visit OT Treatments $Self Care/Home Management : 23-37 mins  [place] Office [phone]   Delight Stare 03/28/2022, 9:23 AM

## 2022-04-01 ENCOUNTER — Other Ambulatory Visit: Payer: Self-pay | Admitting: Cardiology

## 2022-04-01 ENCOUNTER — Encounter: Payer: Self-pay | Admitting: *Deleted

## 2022-04-01 DIAGNOSIS — I639 Cerebral infarction, unspecified: Secondary | ICD-10-CM

## 2022-04-01 DIAGNOSIS — I4891 Unspecified atrial fibrillation: Secondary | ICD-10-CM

## 2022-04-01 NOTE — Progress Notes (Signed)
Patient ID: Jason Jones, male   DOB: 07/08/1950, 71 y.o.   MRN: [id number] Patient enrolled for [employer] to ship a 30 day cardiac event monitor to:  [facility] in care of Nicklaus Alviar [address], [place], [place]  [address] Attn: Director of Nursing  Contact phone # [phone]  Letter with instruction mailed to same address.  Dr.Croitoru to read.

## 2022-04-12 ENCOUNTER — Encounter (HOSPITAL_COMMUNITY): Payer: Self-pay | Admitting: Internal Medicine

## 2022-04-12 ENCOUNTER — Inpatient Hospital Stay (HOSPITAL_COMMUNITY)
Admission: EM | Admit: 2022-04-12 | Discharge: 2022-04-17 | DRG: 699 | Disposition: A | Discharge status: Skilled Nursing Facility | Payer: Medicare Other | Source: Skilled Nursing Facility | Attending: Family Medicine | Admitting: Family Medicine

## 2022-04-12 ENCOUNTER — Emergency Department (HOSPITAL_COMMUNITY): Payer: Medicare Other

## 2022-04-12 ENCOUNTER — Other Ambulatory Visit: Payer: Self-pay

## 2022-04-12 DIAGNOSIS — K5900 Constipation, unspecified: Secondary | ICD-10-CM | POA: Diagnosis present

## 2022-04-12 DIAGNOSIS — N320 Bladder-neck obstruction: Secondary | ICD-10-CM | POA: Diagnosis present

## 2022-04-12 DIAGNOSIS — I69391 Dysphagia following cerebral infarction: Secondary | ICD-10-CM

## 2022-04-12 DIAGNOSIS — N401 Enlarged prostate with lower urinary tract symptoms: Secondary | ICD-10-CM | POA: Diagnosis not present

## 2022-04-12 DIAGNOSIS — Z923 Personal history of irradiation: Secondary | ICD-10-CM

## 2022-04-12 DIAGNOSIS — I69354 Hemiplegia and hemiparesis following cerebral infarction affecting left non-dominant side: Secondary | ICD-10-CM

## 2022-04-12 DIAGNOSIS — R1312 Dysphagia, oropharyngeal phase: Secondary | ICD-10-CM | POA: Diagnosis present

## 2022-04-12 DIAGNOSIS — Z7982 Long term (current) use of aspirin: Secondary | ICD-10-CM

## 2022-04-12 DIAGNOSIS — N179 Acute kidney failure, unspecified: Secondary | ICD-10-CM | POA: Diagnosis present

## 2022-04-12 DIAGNOSIS — Y92230 Patient room in hospital as the place of occurrence of the external cause: Secondary | ICD-10-CM | POA: Diagnosis not present

## 2022-04-12 DIAGNOSIS — Y846 Urinary catheterization as the cause of abnormal reaction of the patient, or of later complication, without mention of misadventure at the time of the procedure: Secondary | ICD-10-CM | POA: Diagnosis present

## 2022-04-12 DIAGNOSIS — Z8546 Personal history of malignant neoplasm of prostate: Secondary | ICD-10-CM

## 2022-04-12 DIAGNOSIS — I10 Essential (primary) hypertension: Secondary | ICD-10-CM | POA: Diagnosis present

## 2022-04-12 DIAGNOSIS — W19XXXA Unspecified fall, initial encounter: Secondary | ICD-10-CM | POA: Diagnosis not present

## 2022-04-12 DIAGNOSIS — Z681 Body mass index (BMI) 19 or less, adult: Secondary | ICD-10-CM | POA: Diagnosis not present

## 2022-04-12 DIAGNOSIS — I69322 Dysarthria following cerebral infarction: Secondary | ICD-10-CM

## 2022-04-12 DIAGNOSIS — Z79899 Other long term (current) drug therapy: Secondary | ICD-10-CM

## 2022-04-12 DIAGNOSIS — Z7902 Long term (current) use of antithrombotics/antiplatelets: Secondary | ICD-10-CM | POA: Diagnosis not present

## 2022-04-12 DIAGNOSIS — E440 Moderate protein-calorie malnutrition: Secondary | ICD-10-CM | POA: Diagnosis present

## 2022-04-12 DIAGNOSIS — M25512 Pain in left shoulder: Secondary | ICD-10-CM | POA: Diagnosis present

## 2022-04-12 DIAGNOSIS — Z8042 Family history of malignant neoplasm of prostate: Secondary | ICD-10-CM | POA: Diagnosis not present

## 2022-04-12 DIAGNOSIS — N400 Enlarged prostate without lower urinary tract symptoms: Secondary | ICD-10-CM | POA: Diagnosis present

## 2022-04-12 DIAGNOSIS — Z8673 Personal history of transient ischemic attack (TIA), and cerebral infarction without residual deficits: Secondary | ICD-10-CM | POA: Diagnosis not present

## 2022-04-12 DIAGNOSIS — N3041 Irradiation cystitis with hematuria: Secondary | ICD-10-CM | POA: Diagnosis present

## 2022-04-12 DIAGNOSIS — J45909 Unspecified asthma, uncomplicated: Secondary | ICD-10-CM | POA: Diagnosis present

## 2022-04-12 DIAGNOSIS — K219 Gastro-esophageal reflux disease without esophagitis: Secondary | ICD-10-CM | POA: Diagnosis present

## 2022-04-12 DIAGNOSIS — F17210 Nicotine dependence, cigarettes, uncomplicated: Secondary | ICD-10-CM | POA: Diagnosis present

## 2022-04-12 DIAGNOSIS — N138 Other obstructive and reflux uropathy: Secondary | ICD-10-CM | POA: Insufficient documentation

## 2022-04-12 DIAGNOSIS — R338 Other retention of urine: Secondary | ICD-10-CM | POA: Diagnosis present

## 2022-04-12 DIAGNOSIS — T8383XA Hemorrhage of genitourinary prosthetic devices, implants and grafts, initial encounter: Secondary | ICD-10-CM | POA: Diagnosis not present

## 2022-04-12 DIAGNOSIS — N1330 Unspecified hydronephrosis: Principal | ICD-10-CM

## 2022-04-12 DIAGNOSIS — N3091 Cystitis, unspecified with hematuria: Secondary | ICD-10-CM

## 2022-04-12 LAB — URINALYSIS, ROUTINE W REFLEX MICROSCOPIC
Bilirubin Urine: NEGATIVE
Glucose, UA: NEGATIVE mg/dL
Ketones, ur: NEGATIVE mg/dL
Leukocytes,Ua: NEGATIVE
Nitrite: NEGATIVE
Protein, ur: >=300 mg/dL — AB
RBC / HPF: >50 RBC/hpf — ABNORMAL HIGH (ref 0–5)
Specific Gravity, Urine: 1.017 (ref 1.005–1.030)
pH: 8 (ref 5.0–8.0)

## 2022-04-12 LAB — CBC WITH DIFFERENTIAL/PLATELET
Abs Immature Granulocytes: 0.04 10*3/uL (ref 0.00–0.07)
Basophils Absolute: 0 10*3/uL (ref 0.0–0.1)
Basophils Relative: 0 %
Eosinophils Absolute: 0 10*3/uL (ref 0.0–0.5)
Eosinophils Relative: 0 %
HCT: 41.1 % (ref 39.0–52.0)
Hemoglobin: 13.5 g/dL (ref 13.0–17.0)
Immature Granulocytes: 0 %
Lymphocytes Relative: 4 %
Lymphs Abs: 0.4 10*3/uL — ABNORMAL LOW (ref 0.7–4.0)
MCH: 29.6 pg (ref 26.0–34.0)
MCHC: 32.8 g/dL (ref 30.0–36.0)
MCV: 90.1 fL (ref 80.0–100.0)
Monocytes Absolute: 0.6 10*3/uL (ref 0.1–1.0)
Monocytes Relative: 5 %
Neutro Abs: 10.5 10*3/uL — ABNORMAL HIGH (ref 1.7–7.7)
Neutrophils Relative %: 91 %
Platelets: 410 10*3/uL — ABNORMAL HIGH (ref 150–400)
RBC: 4.56 MIL/uL (ref 4.22–5.81)
RDW: 13.3 % (ref 11.5–15.5)
WBC: 11.5 10*3/uL — ABNORMAL HIGH (ref 4.0–10.5)
nRBC: 0 % (ref 0.0–0.2)

## 2022-04-12 LAB — COMPREHENSIVE METABOLIC PANEL
ALT: 31 U/L (ref 0–44)
AST: 28 U/L (ref 15–41)
Albumin: 3.6 g/dL (ref 3.5–5.0)
Alkaline Phosphatase: 59 U/L (ref 38–126)
Anion gap: 12 (ref 5–15)
BUN: 36 mg/dL — ABNORMAL HIGH (ref 8–23)
CO2: 23 mmol/L (ref 22–32)
Calcium: 9.5 mg/dL (ref 8.9–10.3)
Chloride: 103 mmol/L (ref 98–111)
Creatinine, Ser: 2.05 mg/dL — ABNORMAL HIGH (ref 0.61–1.24)
GFR, Estimated: 34 mL/min — ABNORMAL LOW (ref >60)
Glucose, Bld: 147 mg/dL — ABNORMAL HIGH (ref 70–99)
Potassium: 4.8 mmol/L (ref 3.5–5.1)
Sodium: 138 mmol/L (ref 135–145)
Total Bilirubin: 0.6 mg/dL (ref 0.3–1.2)
Total Protein: 8.6 g/dL — ABNORMAL HIGH (ref 6.5–8.1)

## 2022-04-12 LAB — CREATININE, URINE, RANDOM: Creatinine, Urine: 104 mg/dL

## 2022-04-12 LAB — NA AND K (SODIUM & POTASSIUM), RAND UR
Potassium Urine: 49 mmol/L
Sodium, Ur: 88 mmol/L

## 2022-04-12 LAB — OSMOLALITY, URINE: Osmolality, Ur: 563 mOsm/kg (ref 300–900)

## 2022-04-12 LAB — OSMOLALITY: Osmolality: 309 mOsm/kg — ABNORMAL HIGH (ref 275–295)

## 2022-04-12 MED ORDER — DOXAZOSIN MESYLATE 1 MG PO TABS
1.0000 mg | ORAL_TABLET | Freq: Every day | ORAL | Status: DC
  Administered 2022-04-13 – 2022-04-17 (×5): 1 mg via ORAL
  Filled 2022-04-12 (×7): qty 1

## 2022-04-12 MED ORDER — ONDANSETRON HCL 4 MG/2ML IJ SOLN: 4.0000 mg | Freq: Once | INTRAMUSCULAR | Status: DC

## 2022-04-12 MED ORDER — ACETAMINOPHEN 325 MG PO TABS: 650.0000 mg | ORAL_TABLET | ORAL | Status: DC | PRN

## 2022-04-12 MED ORDER — ALBUTEROL SULFATE (2.5 MG/3ML) 0.083% IN NEBU: 2.5000 mg | INHALATION_SOLUTION | RESPIRATORY_TRACT | Status: DC | PRN

## 2022-04-12 MED ORDER — POLYETHYLENE GLYCOL 3350 17 G PO PACK
17.0000 g | PACK | Freq: Every day | ORAL | Status: DC
  Administered 2022-04-12 – 2022-04-17 (×5): 17 g via ORAL
  Filled 2022-04-12 (×6): qty 1

## 2022-04-12 MED ORDER — SODIUM CHLORIDE 0.9 % IV BOLUS
1000.0000 mL | Freq: Once | INTRAVENOUS | Status: AC
  Administered 2022-04-12: 1000 mL via INTRAVENOUS

## 2022-04-12 MED ORDER — MORPHINE SULFATE (PF) 4 MG/ML IV SOLN: 4.0000 mg | Freq: Once | INTRAVENOUS | Status: DC

## 2022-04-12 MED ORDER — SENNOSIDES-DOCUSATE SODIUM 8.6-50 MG PO TABS
2.0000 | ORAL_TABLET | Freq: Two times a day (BID) | ORAL | Status: DC
  Administered 2022-04-12 – 2022-04-17 (×8): 2 via ORAL
  Filled 2022-04-12 (×9): qty 2

## 2022-04-12 MED ORDER — ENOXAPARIN SODIUM 30 MG/0.3ML IJ SOSY
30.0000 mg | PREFILLED_SYRINGE | INTRAMUSCULAR | Status: DC
  Administered 2022-04-12: 30 mg via SUBCUTANEOUS
  Filled 2022-04-12: qty 0.3

## 2022-04-12 MED ORDER — AMLODIPINE BESYLATE 5 MG PO TABS
5.0000 mg | ORAL_TABLET | Freq: Every day | ORAL | Status: DC
  Administered 2022-04-13 – 2022-04-17 (×5): 5 mg via ORAL
  Filled 2022-04-12 (×6): qty 1

## 2022-04-12 MED ORDER — BISACODYL 10 MG RE SUPP
10.0000 mg | Freq: Once | RECTAL | Status: AC
  Administered 2022-04-12: 10 mg via RECTAL
  Filled 2022-04-12: qty 1

## 2022-04-12 MED ORDER — FOOD THICKENER (SIMPLYTHICK): 1.0000 | ORAL | Status: DC | PRN

## 2022-04-12 MED ORDER — ROSUVASTATIN CALCIUM 20 MG PO TABS
20.0000 mg | ORAL_TABLET | Freq: Every day | ORAL | Status: DC
  Administered 2022-04-13 – 2022-04-17 (×5): 20 mg via ORAL
  Filled 2022-04-12 (×6): qty 1

## 2022-04-12 MED ORDER — IOHEXOL 300 MG/ML  SOLN
100.0000 mL | Freq: Once | INTRAMUSCULAR | Status: AC | PRN
  Administered 2022-04-12: 100 mL via INTRAVENOUS

## 2022-04-12 MED ORDER — SODIUM CHLORIDE 0.9 % IV SOLN
1.0000 g | Freq: Once | INTRAVENOUS | Status: AC
  Administered 2022-04-12: 1 g via INTRAVENOUS
  Filled 2022-04-12: qty 10

## 2022-04-12 MED ORDER — SODIUM CHLORIDE 0.9 % IV BOLUS
500.0000 mL | Freq: Once | INTRAVENOUS | Status: AC
  Administered 2022-04-12: 500 mL via INTRAVENOUS

## 2022-04-12 MED ORDER — LACTATED RINGERS IV BOLUS: 1500.0000 mL | Freq: Once | INTRAVENOUS | Status: DC

## 2022-04-12 MED ORDER — LACTATED RINGERS IV SOLN: INTRAVENOUS | Status: DC

## 2022-04-12 NOTE — Plan of Care (Signed)
  Problem: Nutrition: Goal: Adequate nutrition will be maintained Outcome: Progressing   Problem: Safety: Goal: Ability to remain free from injury will improve Outcome: Progressing   Problem: Skin Integrity: Goal: Risk for impaired skin integrity will decrease Outcome: Progressing   

## 2022-04-12 NOTE — Consult Note (Signed)
Urology Consult   Physician requesting consult: Hosie Poisson, MD  Reason for consult: hematuria, bilateral hydronephrosis  History of Present Illness: Jason Jones is a 71 y.o. male with a PMH of prostate cancer s/p XRT, stroke with residual left-sided deficits, HTN, GERD, asthma, and arthritis who presented to [facility] ED with lower abdominal pain and hematuria after catheter was tugged.  Briefly, patient reports he had a catheter placed for urinary retention during a recent admission for a acute CVA. He was discharged with a catheter in place with plans to follow-up as an outpatient for TOV.   Patient reports his catheter was tugged this afternoon but is unsure when or how. He notes onset of pain in his suprapubic region as well as bloody urine in the catheter bag. He is unsure if there was any blood present prior to the tugging episode. He denies fevers, chills, nausea, vomiting. No flank pain.  On arrival in the ED, patient's catheter was exchanged for an 29F coude. A CT scan was obtained that demonstrates mild bilateral hydroureteronephrosis and a thickened urinary bladder wall with a large intravesical median lobe. There is no clear obstructive etiology on CT. Labs notable for Cr 2.05 (baseline 0.9-1.1). WBC 11.5. UA with negative nitrites, negative leukocytes, 21-50 WBC. Ucx pending  On my examination, patient's catheter initially draining a thin, dark maroon. There did not appear to be any clots in the tubing or catheter drainage bag. His catheter was manually irrigated with 50cc sterile saline with return of ~10-15cc small, flaky, dark clot. His urine cleared to a light tea-color after irrigation with ~200cc sterile saline. His catheter bag was attached to drainage bag.   Past Medical History:  Diagnosis Date   Arthritis    Asthma    Cancer ([place])    Chronic alcohol abuse    Dyspnea    GERD (gastroesophageal reflux disease)    PRN  ---  TAKES BAKING SODA IN WATER   History of pneumothorax     04-28-2006  fell, left fx rib--  resolved w/ chest tube   Hypertension    Nocturia    Poor dental hygiene    Right inguinal hernia    Weak urinary stream     Past Surgical History:  Procedure Laterality Date   COLONOSCOPY  01/08/2021   GOLD SEED IMPLANT N/A 05/23/2021   Procedure: GOLD SEED IMPLANT;  Surgeon: Ceasar Mons, MD;  Location: [facility];  Service: Urology;  Laterality: N/A;   INGUINAL HERNIA REPAIR Right 04/23/2017   Procedure: OPEN RIGHT INGUINAL HERNIA REPAIR WITH MESH;  Surgeon: Kinsinger, Arta Bruce, MD;  Location: [place];  Service: General;  Laterality: Right;  GENERAL COMBINED WITH REGIONAL FOR POST OP PAIN    INSERTION OF MESH Right 04/23/2017   Procedure: INSERTION OF MESH;  Surgeon: Kinsinger, Arta Bruce, MD;  Location: [place];  Service: General;  Laterality: Right;  GENERAL COMBINED WITH REGIONAL FOR POST OP PAIN    LIPOMA EXCISION Right 02/22/2015   Procedure: RIGHT SHOULDER MASS EXCISION 15 CM SQ;  Surgeon: Mickeal Skinner, MD;  Location: [facility];  Service: General;  Laterality: Right;   SPACE OAR INSTILLATION N/A 05/23/2021   Procedure: SPACE OAR INSTILLATION;  Surgeon: Ceasar Mons, MD;  Location: [facility];  Service: Urology;  Laterality: N/A;   TRANSRECTAL ULTRASOUND N/A 05/23/2021   Procedure: TRANSRECTAL ULTRASOUND;  Surgeon: Ceasar Mons, MD;  Location: [facility];  Service: Urology;  Laterality: N/A;  Current Hospital Medications:  Home Meds:  No current facility-administered medications on file prior to encounter.   Current Outpatient Medications on File Prior to Encounter  Medication Sig Dispense Refill   albuterol (PROVENTIL) (2.5 MG/3ML) 0.083% nebulizer solution Take 3 mLs (2.5 mg total) by nebulization every 4 (four) hours as needed for wheezing or shortness of breath. 75 mL 12   amLODipine (NORVASC) 5 MG tablet Take 1 tablet (5 mg total) by mouth  daily.     aspirin 81 MG chewable tablet Chew 1 tablet (81 mg total) by mouth daily.     bisacodyl (DULCOLAX) 10 MG suppository Place 10 mg rectally once.     clopidogrel (PLAVIX) 75 MG tablet Take 1 tablet (75 mg total) by mouth daily. Take 1 tablet by mouth until 06/25/22 to complete a 3 month course. (Patient taking differently: Take 75 mg by mouth daily.) 30 tablet 2   doxazosin (CARDURA) 1 MG tablet Take 1 tablet (1 mg total) by mouth daily. For BPH     feeding supplement (ENSURE ENLIVE / ENSURE PLUS) LIQD Take 237 mLs by mouth 2 (two) times daily between meals. (Patient taking differently: Take 237 mLs by mouth 2 (two) times daily.) 237 mL 12   rosuvastatin (CRESTOR) 20 MG tablet Take 1 tablet (20 mg total) by mouth daily.     sennosides-docusate sodium (SENOKOT-S) 8.6-50 MG tablet Take 1 tablet by mouth at bedtime.     food thickener (SIMPLYTHICK, NECTAR/LEVEL 2/MILDLY THICK,) GEL Take 1 packet by mouth as needed. (Patient not taking: Reported on 04/12/2022)     nicotine (NICODERM CQ - DOSED IN MG/24 HOURS) 21 mg/24hr patch Place 1 patch (21 mg total) onto the skin daily. (Patient not taking: Reported on 04/12/2022) 28 patch 0     Scheduled Meds:  amLODipine  5 mg Oral Daily   bisacodyl  10 mg Rectal Once   doxazosin  1 mg Oral Daily   enoxaparin (LOVENOX) injection  30 mg Subcutaneous Q24H    morphine injection  4 mg Intravenous Once   ondansetron (ZOFRAN) IV  4 mg Intravenous Once   polyethylene glycol  17 g Oral Daily   rosuvastatin  20 mg Oral Daily   senna-docusate  2 tablet Oral BID   Continuous Infusions:  lactated ringers 100 mL/hr at 04/12/22 2030   PRN Meds:.acetaminophen, albuterol, food thickener  Allergies:  Allergies  Allergen Reactions   No Known Allergies     Family History  Problem Relation Age of Onset   Prostate cancer Cousin    Prostate cancer Cousin     Social History:  reports that he has been smoking cigarettes. He has a 25.00 pack-year smoking  history. He has never used smokeless tobacco. He reports that he does not currently use alcohol. He reports that he does not currently use drugs after having used the following drugs: Marijuana.  ROS: A complete review of systems was performed.  All systems are negative except for pertinent findings as noted.  Physical Exam:  Vital signs in last 24 hours: Temp:  [98 F (36.7 C)-98.8 F (37.1 C)] 98.8 F (37.1 C) (11/03 2025) Pulse Rate:  [80-105] 103 (11/03 2025) Resp:  [16-18] 18 (11/03 1930) BP: (113-132)/(57-88) 115/70 (11/03 2025) SpO2:  [94 %-98 %] 96 % (11/03 2025) Weight:  [62.4 kg] 62.4 kg (11/03 2021) Constitutional:  Alert and oriented, No acute distress Cardiovascular: Regular rate and rhythm, No JVD Respiratory: Normal respiratory effort, Lungs clear bilaterally GI: Abdomen is soft, nontender,  nondistended, no abdominal masses GU: Foley catheter draining thin, dark maroon urine prior to irrigation; urine thin tea-color after manual irrigation Lymphatic: No lymphadenopathy Neurologic: Grossly intact Psychiatric: Normal mood and affect  Laboratory Data:  Recent Labs    04/12/22 1346  WBC 11.5*  HGB 13.5  HCT 41.1  PLT 410*    Recent Labs    04/12/22 1346  NA 138  K 4.8  CL 103  GLUCOSE 147*  BUN 36*  CALCIUM 9.5  CREATININE 2.05*     Results for orders placed or performed during the hospital encounter of 04/12/22 (from the past 24 hour(s))  Urinalysis, Routine w reflex microscopic Urine, Catheterized     Status: Abnormal   Collection Time: 04/12/22  1:46 PM  Result Value Ref Range   Color, Urine RED (A) YELLOW   APPearance HAZY (A) CLEAR   Specific Gravity, Urine 1.017 1.005 - 1.030   pH 8.0 5.0 - 8.0   Glucose, UA NEGATIVE NEGATIVE mg/dL   Hgb urine dipstick MODERATE (A) NEGATIVE   Bilirubin Urine NEGATIVE NEGATIVE   Ketones, ur NEGATIVE NEGATIVE mg/dL   Protein, ur >=300 (A) NEGATIVE mg/dL   Nitrite NEGATIVE NEGATIVE   Leukocytes,Ua NEGATIVE  NEGATIVE   RBC / HPF >50 (H) 0 - 5 RBC/hpf   WBC, UA 21-50 0 - 5 WBC/hpf   Bacteria, UA FEW (A) NONE SEEN   Squamous Epithelial / LPF 0-5 0 - 5  Comprehensive metabolic panel     Status: Abnormal   Collection Time: 04/12/22  1:46 PM  Result Value Ref Range   Sodium 138 135 - 145 mmol/L   Potassium 4.8 3.5 - 5.1 mmol/L   Chloride 103 98 - 111 mmol/L   CO2 23 22 - 32 mmol/L   Glucose, Bld 147 (H) 70 - 99 mg/dL   BUN 36 (H) 8 - 23 mg/dL   Creatinine, Ser 2.05 (H) 0.61 - 1.24 mg/dL   Calcium 9.5 8.9 - 10.3 mg/dL   Total Protein 8.6 (H) 6.5 - 8.1 g/dL   Albumin 3.6 3.5 - 5.0 g/dL   AST 28 15 - 41 U/L   ALT 31 0 - 44 U/L   Alkaline Phosphatase 59 38 - 126 U/L   Total Bilirubin 0.6 0.3 - 1.2 mg/dL   GFR, Estimated 34 (L) >60 mL/min   Anion gap 12 5 - 15  CBC with Differential     Status: Abnormal   Collection Time: 04/12/22  1:46 PM  Result Value Ref Range   WBC 11.5 (H) 4.0 - 10.5 K/uL   RBC 4.56 4.22 - 5.81 MIL/uL   Hemoglobin 13.5 13.0 - 17.0 g/dL   HCT 41.1 39.0 - 52.0 %   MCV 90.1 80.0 - 100.0 fL   MCH 29.6 26.0 - 34.0 pg   MCHC 32.8 30.0 - 36.0 g/dL   RDW 13.3 11.5 - 15.5 %   Platelets 410 (H) 150 - 400 K/uL   nRBC 0.0 0.0 - 0.2 %   Neutrophils Relative % 91 %   Neutro Abs 10.5 (H) 1.7 - 7.7 K/uL   Lymphocytes Relative 4 %   Lymphs Abs 0.4 (L) 0.7 - 4.0 K/uL   Monocytes Relative 5 %   Monocytes Absolute 0.6 0.1 - 1.0 K/uL   Eosinophils Relative 0 %   Eosinophils Absolute 0.0 0.0 - 0.5 K/uL   Basophils Relative 0 %   Basophils Absolute 0.0 0.0 - 0.1 K/uL   Immature Granulocytes 0 %   Abs  Immature Granulocytes 0.04 0.00 - 0.07 K/uL  Na and K (sodium & potassium), rand urine     Status: None   Collection Time: 04/12/22  1:46 PM  Result Value Ref Range   Sodium, Ur 88 mmol/L   Potassium Urine 49 mmol/L  Creatinine, urine, random     Status: None   Collection Time: 04/12/22  1:46 PM  Result Value Ref Range   Creatinine, Urine 104 mg/dL   No results found for this  or any previous visit (from the past 240 hour(s)).  Renal Function: Recent Labs    04/12/22 1346  CREATININE 2.05*   Estimated Creatinine Clearance: 28.7 mL/min (A) (by C-G formula based on SCr of 2.05 mg/dL (H)).  Radiologic Imaging: CT Abdomen Pelvis W Contrast  Result Date: 04/12/2022 CLINICAL DATA:  Abdominal pain, hematuria, history of prostate carcinoma EXAM: CT ABDOMEN AND PELVIS WITH CONTRAST TECHNIQUE: Multidetector CT imaging of the abdomen and pelvis was performed using the standard protocol following bolus administration of intravenous contrast. RADIATION DOSE REDUCTION: This exam was performed according to the departmental dose-optimization program which includes automated exposure control, adjustment of the mA and/or kV according to patient size and/or use of iterative reconstruction technique. CONTRAST:  142m OMNIPAQUE IOHEXOL 300 MG/ML  SOLN COMPARISON:  Abdomen radiograph done on 03/24/2022 FINDINGS: Lower chest: There are linear patchy densities in left lower lung field. Hepatobiliary: No focal abnormalities are seen in liver. There is no dilation of bile ducts. Motion artifacts limit evaluation of gallbladder. Pancreas: No focal abnormalities are seen. Spleen: Unremarkable. Adrenals/Urinary Tract: Adrenals are unremarkable. There is mild hydronephrosis in both kidneys. There are no demonstrable renal or ureteral stones. There is marked diffuse wall thickening in urinary bladder. Foley catheter is seen in the bladder. There is increased density in the dependent portion of urinary bladder lumen, possibly blood clot. Prostate is enlarged projecting into the base of the bladder. Small diverticulum is noted in the right lateral margin of the urinary bladder. Stomach/Bowel: Stomach is not distended. Small bowel loops are not dilated. Appendix is unremarkable. There is no significant wall thickening in colon. Scattered diverticula are seen. There are no signs of focal acute diverticulitis.  Vascular/Lymphatic: Scattered arterial calcifications are seen. Reproductive: Prostate is enlarged. There are surgical clips in prostate. Other: There is no ascites or pneumoperitoneum. Large umbilical hernia containing fat is seen. Musculoskeletal: Degenerative changes are noted in lumbar spine, more so at L4-L5 level. IMPRESSION: There is no evidence of intestinal obstruction or pneumoperitoneum. There is mild hydronephrosis in both kidneys. Both ureters are dilated. There are no opaque renal or ureteral stones. Prominence of collecting systems in both kidneys may suggest partial obstruction at the ureterovesical junctions. There is marked diffuse wall thickening in urinary bladder which may be due to chronic bladder outlet obstruction or cystitis. High density in the dependent portion of the bladder lumen may suggest blood products. Foley catheter is seen in the bladder. Prostate is enlarged projecting into the base of the urinary bladder. Linear patchy infiltrate in left lower lung fields may suggest atelectasis/pneumonia. Diverticulosis of colon without signs of focal diverticulitis. Other findings as described in the body of the report. Electronically Signed   By: PElmer PickerM.D.   On: 04/12/2022 15:42    I independently reviewed the above imaging studies.  Impression/Recommendation:    Gross hematuria Likely related to traumatic catheter manipulation and friable post-radiation prostatic tissue Manual irrigation of existing 7F catheter performed without difficulty with minimal return of clot and  rapid clearing of urine Continue foley catheter x10 days for urethral trauma Alert Urology if urine darkens considerably or catheter stops draining. 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

## 2022-04-12 NOTE — ED Triage Notes (Addendum)
Pt BIBA from [facility]. C/o groin and abd pain that began when his foley was partially dislodged last night. Blood noted in foley bag. Odorous discharge from urethra. Pt also reports constipation.  Aox4  BP: 146/92 HR: 140 SPO2: 96 Temp: 98 f oral

## 2022-04-12 NOTE — H&P (Signed)
History and Physical    Patient: Jason Jones EQA:[id number] DOB: September 15, 1950 DOA: 04/12/2022 DOS: the patient was seen and examined on 04/12/2022 PCP: Arthur Holms, NP  Patient coming from: SNF  Chief Complaint:  Chief Complaint  Patient presents with   Groin Pain   Hematuria   HPI: Jason Jones is a 71 y.o. male with medical history significant of recent CVA, chronic alcohol use, hypertension. GERD, asthma, was recently admitted to  Internal Medicine teaching service  after a right PCA stroke and was discharged to SNF on 10/191/2023. He presents from SNF for generalized weakness, supra pubic pain and hematuria started today. He denies any fevers, chills, nausea, vomiting or diarrhea. He reports being constipated, hasn't had BM in 5 days. No chest pain or sob, syncope, or urinary symptoms.  He denies hematochezia or hematemesis.  His hospital course was complicated by urinary retention from BPH and he was discharged with a foley catheter.  On arrival to ED, pt is afebrile, tachycardic, and normotensive.  Labs revealed AKI, with creatinine of 2.05, from a baseline of 0.95 and wbc count of 11,500. Hemoglobin at 13.5.   UA shows moderate hemoglobin, >300 protein and few bacteria, and more than 50 RBC's.  Urine cultures done and pending.  CT ad and pelvis done showing There is mild hydronephrosis in both kidneys. Both ureters are dilated. There are no opaque renal or ureteral stones. Prominence of collecting systems in both kidneys may suggest partial obstruction at the ureterovesical junctions. There is marked diffuse wall thickening in urinary bladder which may be due to chronic bladder outlet obstruction or cystitis. High density in the dependent portion of the bladder lumen may suggest blood products. Foley catheter is seen in the bladder. Prostate is enlarged projecting into the base of the urinary bladder.   Urology consulted by EDP who will see the patient.  He was referred to [facility] for  admission for AKI and hematuria   Review of Systems: As mentioned in the history of present illness. All other systems reviewed and are negative. Past Medical History:  Diagnosis Date   Arthritis    Asthma    Cancer ([place])    Chronic alcohol abuse    Dyspnea    GERD (gastroesophageal reflux disease)    PRN  ---  TAKES BAKING SODA IN WATER   History of pneumothorax    04-28-2006  fell, left fx rib--  resolved w/ chest tube   Hypertension    Nocturia    Poor dental hygiene    Right inguinal hernia    Weak urinary stream    Past Surgical History:  Procedure Laterality Date   COLONOSCOPY  01/08/2021   GOLD SEED IMPLANT N/A 05/23/2021   Procedure: GOLD SEED IMPLANT;  Surgeon: Ceasar Mons, MD;  Location: [facility];  Service: Urology;  Laterality: N/A;   INGUINAL HERNIA REPAIR Right 04/23/2017   Procedure: OPEN RIGHT INGUINAL HERNIA REPAIR WITH MESH;  Surgeon: Kinsinger, Arta Bruce, MD;  Location: [place];  Service: General;  Laterality: Right;  GENERAL COMBINED WITH REGIONAL FOR POST OP PAIN    INSERTION OF MESH Right 04/23/2017   Procedure: INSERTION OF MESH;  Surgeon: Kinsinger, Arta Bruce, MD;  Location: [place];  Service: General;  Laterality: Right;  GENERAL COMBINED WITH REGIONAL FOR POST OP PAIN    LIPOMA EXCISION Right 02/22/2015   Procedure: RIGHT SHOULDER MASS EXCISION 15 CM SQ;  Surgeon: Mickeal Skinner, MD;  Location: [facility];  Service: General;  Laterality: Right;   SPACE OAR INSTILLATION N/A 05/23/2021   Procedure: SPACE OAR INSTILLATION;  Surgeon: Ceasar Mons, MD;  Location: [facility];  Service: Urology;  Laterality: N/A;   TRANSRECTAL ULTRASOUND N/A 05/23/2021   Procedure: TRANSRECTAL ULTRASOUND;  Surgeon: Ceasar Mons, MD;  Location: [facility];  Service: Urology;  Laterality: N/A;   Social History:  reports that he has been smoking cigarettes. He has a 25.00 pack-year smoking  history. He has never used smokeless tobacco. He reports that he does not currently use alcohol. He reports that he does not currently use drugs after having used the following drugs: Marijuana.  Allergies  Allergen Reactions   No Known Allergies     Family History  Problem Relation Age of Onset   Prostate cancer Cousin    Prostate cancer Cousin     Prior to Admission medications   Medication Sig Start Date End Date Taking? Authorizing Provider  albuterol (PROVENTIL) (2.5 MG/3ML) 0.083% nebulizer solution Take 3 mLs (2.5 mg total) by nebulization every 4 (four) hours as needed for wheezing or shortness of breath. 03/28/22  Yes Nani Gasser, MD  amLODipine (NORVASC) 5 MG tablet Take 1 tablet (5 mg total) by mouth daily. 03/29/22  Yes Nani Gasser, MD  aspirin 81 MG chewable tablet Chew 1 tablet (81 mg total) by mouth daily. 03/29/22  Yes Nani Gasser, MD  bisacodyl (DULCOLAX) 10 MG suppository Place 10 mg rectally once.   Yes [provider]  clopidogrel (PLAVIX) 75 MG tablet Take 1 tablet (75 mg total) by mouth daily. Take 1 tablet by mouth until 06/25/22 to complete a 3 month course. Patient taking differently: Take 75 mg by mouth daily. 03/29/22 06/25/22 Yes Nani Gasser, MD  doxazosin (CARDURA) 1 MG tablet Take 1 tablet (1 mg total) by mouth daily. For BPH 03/29/22  Yes Nani Gasser, MD  feeding supplement (ENSURE ENLIVE / ENSURE PLUS) LIQD Take 237 mLs by mouth 2 (two) times daily between meals. Patient taking differently: Take 237 mLs by mouth 2 (two) times daily. 03/28/22  Yes Nani Gasser, MD  rosuvastatin (CRESTOR) 20 MG tablet Take 1 tablet (20 mg total) by mouth daily. 03/29/22  Yes Nani Gasser, MD  sennosides-docusate sodium (SENOKOT-S) 8.6-50 MG tablet Take 1 tablet by mouth at bedtime.   Yes [provider]  food thickener (SIMPLYTHICK, NECTAR/LEVEL 2/MILDLY THICK,) GEL Take 1 packet by mouth as needed. Patient not taking:  Reported on 04/12/2022 03/28/22   Nani Gasser, MD  nicotine (NICODERM CQ - DOSED IN MG/24 HOURS) 21 mg/24hr patch Place 1 patch (21 mg total) onto the skin daily. Patient not taking: Reported on 04/12/2022 03/29/22   Nani Gasser, MD    Physical Exam: Vitals:   04/12/22 1402 04/12/22 1445 04/12/22 1530 04/12/22 1600  BP: 132/87 118/66 116/72 119/70  Pulse: 98 80 90 92  Resp: '16 16 16 18  '$ Temp: 98.1 F (36.7 C)     TempSrc: Oral     SpO2: 97% 96% 94% 98%   General exam: Appears calm and comfortable  Respiratory system: Clear to auscultation. Respiratory effort normal. Cardiovascular system: S1 & S2 heard, RRR. No JVD,  No pedal edema. Gastrointestinal system: Abdomen is nondistended, soft and nontender.  Normal bowel sounds heard. Central nervous system: Alert and oriented. Left hemiparesis.  Extremities: no pedal edema.  Skin: No rashes, Psychiatry:. Mood & affect appropriate.   Data Reviewed: Results for orders placed or performed during the hospital encounter  of 04/12/22 (from the past 24 hour(s))  Urinalysis, Routine w reflex microscopic Urine, Catheterized     Status: Abnormal   Collection Time: 04/12/22  1:46 PM  Result Value Ref Range   Color, Urine RED (A) YELLOW   APPearance HAZY (A) CLEAR   Specific Gravity, Urine 1.017 1.005 - 1.030   pH 8.0 5.0 - 8.0   Glucose, UA NEGATIVE NEGATIVE mg/dL   Hgb urine dipstick MODERATE (A) NEGATIVE   Bilirubin Urine NEGATIVE NEGATIVE   Ketones, ur NEGATIVE NEGATIVE mg/dL   Protein, ur >=300 (A) NEGATIVE mg/dL   Nitrite NEGATIVE NEGATIVE   Leukocytes,Ua NEGATIVE NEGATIVE   RBC / HPF >50 (H) 0 - 5 RBC/hpf   WBC, UA 21-50 0 - 5 WBC/hpf   Bacteria, UA FEW (A) NONE SEEN   Squamous Epithelial / LPF 0-5 0 - 5  Comprehensive metabolic panel     Status: Abnormal   Collection Time: 04/12/22  1:46 PM  Result Value Ref Range   Sodium 138 135 - 145 mmol/L   Potassium 4.8 3.5 - 5.1 mmol/L   Chloride 103 98 - 111 mmol/L   CO2 23  22 - 32 mmol/L   Glucose, Bld 147 (H) 70 - 99 mg/dL   BUN 36 (H) 8 - 23 mg/dL   Creatinine, Ser 2.05 (H) 0.61 - 1.24 mg/dL   Calcium 9.5 8.9 - 10.3 mg/dL   Total Protein 8.6 (H) 6.5 - 8.1 g/dL   Albumin 3.6 3.5 - 5.0 g/dL   AST 28 15 - 41 U/L   ALT 31 0 - 44 U/L   Alkaline Phosphatase 59 38 - 126 U/L   Total Bilirubin 0.6 0.3 - 1.2 mg/dL   GFR, Estimated 34 (L) >60 mL/min   Anion gap 12 5 - 15  CBC with Differential     Status: Abnormal   Collection Time: 04/12/22  1:46 PM  Result Value Ref Range   WBC 11.5 (H) 4.0 - 10.5 K/uL   RBC 4.56 4.22 - 5.81 MIL/uL   Hemoglobin 13.5 13.0 - 17.0 g/dL   HCT 41.1 39.0 - 52.0 %   MCV 90.1 80.0 - 100.0 fL   MCH 29.6 26.0 - 34.0 pg   MCHC 32.8 30.0 - 36.0 g/dL   RDW 13.3 11.5 - 15.5 %   Platelets 410 (H) 150 - 400 K/uL   nRBC 0.0 0.0 - 0.2 %   Neutrophils Relative % 91 %   Neutro Abs 10.5 (H) 1.7 - 7.7 K/uL   Lymphocytes Relative 4 %   Lymphs Abs 0.4 (L) 0.7 - 4.0 K/uL   Monocytes Relative 5 %   Monocytes Absolute 0.6 0.1 - 1.0 K/uL   Eosinophils Relative 0 %   Eosinophils Absolute 0.0 0.0 - 0.5 K/uL   Basophils Relative 0 %   Basophils Absolute 0.0 0.0 - 0.1 K/uL   Immature Granulocytes 0 %   Abs Immature Granulocytes 0.04 0.00 - 0.07 K/uL  Na and K (sodium & potassium), rand urine     Status: None   Collection Time: 04/12/22  1:46 PM  Result Value Ref Range   Sodium, Ur 88 mmol/L   Potassium Urine 49 mmol/L  Creatinine, urine, random     Status: None   Collection Time: 04/12/22  1:46 PM  Result Value Ref Range   Creatinine, Urine 104 mg/dL     Assessment and Plan:  Hematuria,/ hemorrhagic cystitis  Mild hydronephrosis with partial obstruction at UV junction Hold aspirin and plavix  for tonight due to persistent hematuria.  One dose of IV rocephin given. Follow urine cultures.  Urology consulted and await recommendations.  Monitor hemoglobin.    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  Was  discharged on aspirin, plavix and statin.  Will need outpatient follow up with Neurologist, and ziopatch for atrial fibrillation.    AKI;  Suspect from a combination of poor oral intake and obstruction/ urinary retention from BPH.  Hydrate and repeat renal parameters in am.  Follow up urine cultures.  Urine electrolytes reviewed.    Dysphagia:  Pt discharged on dysphagia 1 diet .  Will request SLP eval in am.    Hypertension;  BP parameters are optimal.    GERD; Stable.    Asthma:  No wheezing heard.      Advance Care Planning: full code   Consults: urology.   Family Communication: none at bedside.   Severity of Illness: The appropriate patient status for this patient is INPATIENT. Inpatient status is judged to be reasonable and necessary in order to provide the required intensity of service to ensure the patient's safety. The patient's presenting symptoms, physical exam findings, and initial radiographic and laboratory data in the context of their chronic comorbidities is felt to place them at high risk for further clinical deterioration. Furthermore, it is not anticipated that the patient will be medically stable for discharge from the hospital within 2 midnights of admission.   * I certify that at the point of admission it is my clinical judgment that the patient will require inpatient hospital care spanning beyond 2 midnights from the point of admission due to high intensity of service, high risk for further deterioration and high frequency of surveillance required.*  Author: Hosie Poisson, MD 04/12/2022 5:22 PM  For on call review [email or web address].

## 2022-04-12 NOTE — ED Provider Notes (Signed)
[facility] DEPT Provider Note  CSN: [id number] Arrival date & time: 04/12/22 0932  Chief Complaint(s) Groin Pain and Hematuria  HPI Jason Jones is a 71 y.o. male with history of prostate cancer, hypertension, recent stroke with left-sided deficits and now in SNF presenting with lower abdominal pain.  He reports that yesterday his catheter was tugged and he developed suprapubic pain and blood in the catheter bag.  He reports feeling chills but no fevers.  No back pain or flank pain.  The initial listed complaint is testicular pain but he denies testicular pain reports that his primary pain is suprapubic and around Foley catheter site.  He also reports some constipation he reports this is due to getting "too many mashed potatoes".  He denies nausea, vomiting.  No headaches.  Denies new weakness.   Past Medical History Past Medical History:  Diagnosis Date   Arthritis    Asthma    Cancer ([place])    Chronic alcohol abuse    Dyspnea    GERD (gastroesophageal reflux disease)    PRN  ---  TAKES BAKING SODA IN WATER   History of pneumothorax    04-28-2006  fell, left fx rib--  resolved w/ chest tube   Hypertension    Nocturia    Poor dental hygiene    Right inguinal hernia    Weak urinary stream    Patient Active Problem List   Diagnosis Date Noted   AKI (acute kidney injury) ([place]) 04/12/2022   Acute ischemic right PCA stroke ([place]) 03/25/2022   Hypertension 03/25/2022   Dysphagia due to recent cerebrovascular accident (CVA) 03/25/2022   Elevated liver enzymes 03/25/2022   Rhabdomyolysis 03/25/2022   Malignant neoplasm of prostate ([place]) 02/20/2021   Home Medication(s) Prior to Admission medications   Medication Sig Start Date End Date Taking? Authorizing Provider  fluticasone (FLONASE) 50 MCG/ACT nasal spray Place 1 spray into both nostrils daily as needed for allergies or rhinitis. 03/25/22  Yes [provider]  albuterol (PROVENTIL) (2.5  MG/3ML) 0.083% nebulizer solution Take 3 mLs (2.5 mg total) by nebulization every 4 (four) hours as needed for wheezing or shortness of breath. 03/28/22   Nani Gasser, MD  amLODipine (NORVASC) 5 MG tablet Take 1 tablet (5 mg total) by mouth daily. 03/29/22   Nani Gasser, MD  aspirin 81 MG chewable tablet Chew 1 tablet (81 mg total) by mouth daily. 03/29/22   Nani Gasser, MD  clopidogrel (PLAVIX) 75 MG tablet Take 1 tablet (75 mg total) by mouth daily. Take 1 tablet by mouth until 06/25/22 to complete a 3 month course. 03/29/22 06/25/22  Nani Gasser, MD  doxazosin (CARDURA) 1 MG tablet Take 1 tablet (1 mg total) by mouth daily. For BPH 03/29/22   Nani Gasser, MD  feeding supplement (ENSURE ENLIVE / ENSURE PLUS) LIQD Take 237 mLs by mouth 2 (two) times daily between meals. 03/28/22   Nani Gasser, MD  food thickener (SIMPLYTHICK, NECTAR/LEVEL 2/MILDLY THICK,) GEL Take 1 packet by mouth as needed. 03/28/22   Nani Gasser, MD  nicotine (NICODERM CQ - DOSED IN MG/24 HOURS) 21 mg/24hr patch Place 1 patch (21 mg total) onto the skin daily. 03/29/22   Nani Gasser, MD  rosuvastatin (CRESTOR) 20 MG tablet Take 1 tablet (20 mg total) by mouth daily. 03/29/22   Nani Gasser, MD  Past Surgical History Past Surgical History:  Procedure Laterality Date   COLONOSCOPY  01/08/2021   GOLD SEED IMPLANT N/A 05/23/2021   Procedure: GOLD SEED IMPLANT;  Surgeon: Ceasar Mons, MD;  Location: [facility];  Service: Urology;  Laterality: N/A;   INGUINAL HERNIA REPAIR Right 04/23/2017   Procedure: OPEN RIGHT INGUINAL HERNIA REPAIR WITH MESH;  Surgeon: Kinsinger, Arta Bruce, MD;  Location: [facility] [place];  Service: General;  Laterality: Right;  GENERAL COMBINED WITH REGIONAL FOR POST OP PAIN    INSERTION OF MESH Right 04/23/2017    Procedure: INSERTION OF MESH;  Surgeon: Kinsinger, Arta Bruce, MD;  Location: [facility];  Service: General;  Laterality: Right;  GENERAL COMBINED WITH REGIONAL FOR POST OP PAIN    LIPOMA EXCISION Right 02/22/2015   Procedure: RIGHT SHOULDER MASS EXCISION 15 CM SQ;  Surgeon: Mickeal Skinner, MD;  Location: [facility];  Service: General;  Laterality: Right;   SPACE OAR INSTILLATION N/A 05/23/2021   Procedure: SPACE OAR INSTILLATION;  Surgeon: Ceasar Mons, MD;  Location: [facility];  Service: Urology;  Laterality: N/A;   TRANSRECTAL ULTRASOUND N/A 05/23/2021   Procedure: TRANSRECTAL ULTRASOUND;  Surgeon: Ceasar Mons, MD;  Location: [facility];  Service: Urology;  Laterality: N/A;   Family History Family History  Problem Relation Age of Onset   Prostate cancer Cousin    Prostate cancer Cousin     Social History Social History   Tobacco Use   Smoking status: Every Day    Packs/day: 0.50    Years: 50.00    Total pack years: 25.00    Types: Cigarettes   Smokeless tobacco: Never  Vaping Use   Vaping Use: Never used  Substance Use Topics   Alcohol use: Not Currently    Comment: last use 2-3 months ago ,   Drug use: Not Currently    Types: Marijuana    Comment: last use 2 years ago per pt   Allergies No known allergies  Review of Systems Review of Systems  All other systems reviewed and are negative.   Physical Exam Vital Signs  I have reviewed the triage vital signs BP 119/70   Pulse 92   Temp 98.1 F (36.7 C) (Oral)   Resp 18   SpO2 98%  Physical Exam Vitals and nursing note reviewed.  Constitutional:      General: He is not in acute distress.    Appearance: Normal appearance.  HENT:     Mouth/Throat:     Mouth: Mucous membranes are moist.  Eyes:     Conjunctiva/sclera: Conjunctivae normal.  Cardiovascular:     Rate and Rhythm: Normal rate and regular rhythm.  Pulmonary:     Effort: Pulmonary effort is  normal. No respiratory distress.     Breath sounds: Normal breath sounds.  Abdominal:     General: Abdomen is flat.     Palpations: Abdomen is soft.     Tenderness: There is abdominal tenderness (suprapubic tenderness). There is no right CVA tenderness or left CVA tenderness.  Genitourinary:    Comments: Foley catheter in place, normal external male genitalia, no testicular tenderness Musculoskeletal:     Right lower leg: No edema.     Left lower leg: No edema.  Skin:    General: Skin is warm and dry.     Capillary Refill: Capillary refill takes less than 2 seconds.  Neurological:     Mental Status: He is alert and oriented  to person, place, and time.     Comments: Chronic weakness to the left upper and lower extremity with left-sided facial droop and slightly slurred speech  Psychiatric:        Mood and Affect: Mood normal.        Behavior: Behavior normal.     ED Results and Treatments Labs (all labs ordered are listed, but only abnormal results are displayed) Labs Reviewed  URINALYSIS, ROUTINE W REFLEX MICROSCOPIC - Abnormal; Notable for the following components:      Result Value   Color, Urine RED (*)    APPearance HAZY (*)    Hgb urine dipstick MODERATE (*)    Protein, ur >=300 (*)    RBC / HPF >50 (*)    Bacteria, UA FEW (*)    All other components within normal limits  COMPREHENSIVE METABOLIC PANEL - Abnormal; Notable for the following components:   Glucose, Bld 147 (*)    BUN 36 (*)    Creatinine, Ser 2.05 (*)    Total Protein 8.6 (*)    GFR, Estimated 34 (*)    All other components within normal limits  CBC WITH DIFFERENTIAL/PLATELET - Abnormal; Notable for the following components:   WBC 11.5 (*)    Platelets 410 (*)    Neutro Abs 10.5 (*)    Lymphs Abs 0.4 (*)    All other components within normal limits  URINE CULTURE  NA AND K (SODIUM & POTASSIUM), RAND UR  CREATININE, URINE, RANDOM  OSMOLALITY, URINE  UREA NITROGEN, URINE  OSMOLALITY                                                                                                                           Radiology CT Abdomen Pelvis W Contrast  Result Date: 04/12/2022 CLINICAL DATA:  Abdominal pain, hematuria, history of prostate carcinoma EXAM: CT ABDOMEN AND PELVIS WITH CONTRAST TECHNIQUE: Multidetector CT imaging of the abdomen and pelvis was performed using the standard protocol following bolus administration of intravenous contrast. RADIATION DOSE REDUCTION: This exam was performed according to the departmental dose-optimization program which includes automated exposure control, adjustment of the mA and/or kV according to patient size and/or use of iterative reconstruction technique. CONTRAST:  1105m OMNIPAQUE IOHEXOL 300 MG/ML  SOLN COMPARISON:  Abdomen radiograph done on 03/24/2022 FINDINGS: Lower chest: There are linear patchy densities in left lower lung field. Hepatobiliary: No focal abnormalities are seen in liver. There is no dilation of bile ducts. Motion artifacts limit evaluation of gallbladder. Pancreas: No focal abnormalities are seen. Spleen: Unremarkable. Adrenals/Urinary Tract: Adrenals are unremarkable. There is mild hydronephrosis in both kidneys. There are no demonstrable renal or ureteral stones. There is marked diffuse wall thickening in urinary bladder. Foley catheter is seen in the bladder. There is increased density in the dependent portion of urinary bladder lumen, possibly blood clot. Prostate is enlarged projecting into the base of the bladder. Small diverticulum is noted in the right lateral margin of  the urinary bladder. Stomach/Bowel: Stomach is not distended. Small bowel loops are not dilated. Appendix is unremarkable. There is no significant wall thickening in colon. Scattered diverticula are seen. There are no signs of focal acute diverticulitis. Vascular/Lymphatic: Scattered arterial calcifications are seen. Reproductive: Prostate is enlarged. There are surgical clips  in prostate. Other: There is no ascites or pneumoperitoneum. Large umbilical hernia containing fat is seen. Musculoskeletal: Degenerative changes are noted in lumbar spine, more so at L4-L5 level. IMPRESSION: There is no evidence of intestinal obstruction or pneumoperitoneum. There is mild hydronephrosis in both kidneys. Both ureters are dilated. There are no opaque renal or ureteral stones. Prominence of collecting systems in both kidneys may suggest partial obstruction at the ureterovesical junctions. There is marked diffuse wall thickening in urinary bladder which may be due to chronic bladder outlet obstruction or cystitis. High density in the dependent portion of the bladder lumen may suggest blood products. Foley catheter is seen in the bladder. Prostate is enlarged projecting into the base of the urinary bladder. Linear patchy infiltrate in left lower lung fields may suggest atelectasis/pneumonia. Diverticulosis of colon without signs of focal diverticulitis. Other findings as described in the body of the report. Electronically Signed   By: Elmer Picker M.D.   On: 04/12/2022 15:42    Pertinent labs & imaging results that were available during my care of the patient were reviewed by me and considered in my medical decision making (see MDM for details).  Medications Ordered in ED Medications  morphine (PF) 4 MG/ML injection 4 mg (4 mg Intravenous Not Given 04/12/22 1411)  ondansetron (ZOFRAN) injection 4 mg (4 mg Intravenous Not Given 04/12/22 1411)  cefTRIAXone (ROCEPHIN) 1 g in sodium chloride 0.9 % 100 mL IVPB (1 g Intravenous New Bag/Given 04/12/22 1604)  sodium chloride 0.9 % bolus 1,000 mL (0 mLs Intravenous Stopped 04/12/22 1501)  iohexol (OMNIPAQUE) 300 MG/ML solution 100 mL (100 mLs Intravenous Contrast Given 04/12/22 1505)  sodium chloride 0.9 % bolus 500 mL (0 mLs Intravenous Stopped 04/12/22 1609)                                                                                                                                      Procedures Procedures  (including critical care time)  Medical Decision Making / ED Course   MDM:  71 year old male presenting to the emergency department with suprapubic pain after reported incident with Foley catheter per patient.  Differential includes urinary tract infection, pyelonephritis, nephrolithiasis, mechanical complication of Foley catheter.  Given comorbidities, will replace catheter, obtain urine sample, obtain CT scan of the abdomen pelvis to further evaluate.  Low concern for acute testicular pathology such as epididymitis or torsion as patient does not actually have testicular pain.  Less likely hernia but CT scan will also evaluate this.  Patient reports constipation, CT scan will also evaluate for impaction otherwise will recommend MiraLAX  Clinical Course as  of 04/12/22 1619  Fri Apr 12, 2022  1607 Admitted to medicine [WS]  1610 Urology will consult on the patient. Spoke with Dr. Louis Meckel [WS]  1616 Work-up notable for hematuria, possible UTI with WBCs and bacteria in urine and signs of cystitis on CT scan.  CT scan strangely demonstrates bilateral UVJ partial obstruction with bilateral hydronephrosis, unclear cause, possibly due to hematuria, discussed with urology who will consult.  Got IV antibiotics for urine infection.  Laboratory testing also notable for AKI, gave 1.5 L of fluid to help with this. [WS]    Clinical Course User Index [WS] Cristie Hem, MD     Additional history obtained: -Additional history obtained from ems -External records from outside source obtained and reviewed including: Chart review including previous notes, labs, imaging, consultation notes including d/c summary from recent stroke   Lab Tests: -I ordered, reviewed, and interpreted labs.   The pertinent results include:   Labs Reviewed  URINALYSIS, ROUTINE W REFLEX MICROSCOPIC - Abnormal; Notable for the following components:       Result Value   Color, Urine RED (*)    APPearance HAZY (*)    Hgb urine dipstick MODERATE (*)    Protein, ur >=300 (*)    RBC / HPF >50 (*)    Bacteria, UA FEW (*)    All other components within normal limits  COMPREHENSIVE METABOLIC PANEL - Abnormal; Notable for the following components:   Glucose, Bld 147 (*)    BUN 36 (*)    Creatinine, Ser 2.05 (*)    Total Protein 8.6 (*)    GFR, Estimated 34 (*)    All other components within normal limits  CBC WITH DIFFERENTIAL/PLATELET - Abnormal; Notable for the following components:   WBC 11.5 (*)    Platelets 410 (*)    Neutro Abs 10.5 (*)    Lymphs Abs 0.4 (*)    All other components within normal limits  URINE CULTURE  NA AND K (SODIUM & POTASSIUM), RAND UR  CREATININE, URINE, RANDOM  OSMOLALITY, URINE  UREA NITROGEN, URINE  OSMOLALITY    Notable for AKI, hematuria, possible UTI, mild leukocytosis    Imaging Studies ordered: I ordered imaging studies including CT A/P On my interpretation imaging demonstrates bilateral hydronephrosis I independently visualized and interpreted imaging. I agree with the radiologist interpretation   Medicines ordered and prescription drug management: Meds ordered this encounter  Medications   sodium chloride 0.9 % bolus 1,000 mL   morphine (PF) 4 MG/ML injection 4 mg   ondansetron (ZOFRAN) injection 4 mg   iohexol (OMNIPAQUE) 300 MG/ML solution 100 mL   DISCONTD: lactated ringers bolus 1,500 mL   sodium chloride 0.9 % bolus 500 mL   cefTRIAXone (ROCEPHIN) 1 g in sodium chloride 0.9 % 100 mL IVPB    Order Specific Question:   Antibiotic Indication:    Answer:   UTI    -I have reviewed the patients home medicines and have made adjustments as needed   Consultations Obtained: I requested consultation with the hospitalist and urologist,  and discussed lab and imaging findings as well as pertinent plan - they recommend: urology will consult and medicine will admit   Cardiac  Monitoring: The patient was maintained on a cardiac monitor.  I personally viewed and interpreted the cardiac monitored which showed an underlying rhythm of: NSR  Social Determinants of Health:  Diagnosis or treatment significantly limited by social determinants of health: current smoker   Reevaluation: After the  interventions noted above, I reevaluated the patient and found that they have improved  Co morbidities that complicate the patient evaluation  Past Medical History:  Diagnosis Date   Arthritis    Asthma    Cancer ([place])    Chronic alcohol abuse    Dyspnea    GERD (gastroesophageal reflux disease)    PRN  ---  TAKES BAKING SODA IN WATER   History of pneumothorax    04-28-2006  fell, left fx rib--  resolved w/ chest tube   Hypertension    Nocturia    Poor dental hygiene    Right inguinal hernia    Weak urinary stream       Dispostion: Disposition decision including need for hospitalization was considered, and patient admitted to the hospital.    Final Clinical Impression(s) / ED Diagnoses Final diagnoses:  Bilateral hydronephrosis  AKI (acute kidney injury) ([place])  Hemorrhagic cystitis     This chart was dictated using voice recognition software.  Despite best efforts to proofread,  errors can occur which can change the documentation meaning.    Cristie Hem, MD 04/12/22 [phone]

## 2022-04-13 DIAGNOSIS — N1330 Unspecified hydronephrosis: Secondary | ICD-10-CM | POA: Diagnosis not present

## 2022-04-13 DIAGNOSIS — N3091 Cystitis, unspecified with hematuria: Secondary | ICD-10-CM | POA: Diagnosis not present

## 2022-04-13 DIAGNOSIS — N179 Acute kidney failure, unspecified: Secondary | ICD-10-CM | POA: Diagnosis not present

## 2022-04-13 LAB — BASIC METABOLIC PANEL
Anion gap: 7 (ref 5–15)
BUN: 25 mg/dL — ABNORMAL HIGH (ref 8–23)
CO2: 24 mmol/L (ref 22–32)
Calcium: 9.2 mg/dL (ref 8.9–10.3)
Chloride: 107 mmol/L (ref 98–111)
Creatinine, Ser: 1.25 mg/dL — ABNORMAL HIGH (ref 0.61–1.24)
GFR, Estimated: >60 mL/min (ref >60)
Glucose, Bld: 128 mg/dL — ABNORMAL HIGH (ref 70–99)
Potassium: 3.8 mmol/L (ref 3.5–5.1)
Sodium: 138 mmol/L (ref 135–145)

## 2022-04-13 LAB — UREA NITROGEN, URINE: Urea Nitrogen, Ur: 274 mg/dL

## 2022-04-13 LAB — HEMOGLOBIN AND HEMATOCRIT, BLOOD
HCT: 31.9 % — ABNORMAL LOW (ref 39.0–52.0)
Hemoglobin: 10.3 g/dL — ABNORMAL LOW (ref 13.0–17.0)

## 2022-04-13 MED ORDER — MELATONIN 5 MG PO TABS
5.0000 mg | ORAL_TABLET | Freq: Once | ORAL | Status: AC
  Administered 2022-04-13: 5 mg via ORAL
  Filled 2022-04-13: qty 1

## 2022-04-13 MED ORDER — CLOPIDOGREL BISULFATE 75 MG PO TABS
75.0000 mg | ORAL_TABLET | Freq: Every day | ORAL | Status: DC
  Administered 2022-04-13 – 2022-04-17 (×5): 75 mg via ORAL
  Filled 2022-04-13 (×5): qty 1

## 2022-04-13 MED ORDER — SODIUM CHLORIDE 0.9 % IV SOLN: INTRAVENOUS | Status: DC

## 2022-04-13 MED ORDER — SALINE SPRAY 0.65 % NA SOLN
1.0000 | NASAL | Status: DC | PRN
  Filled 2022-04-13: qty 44

## 2022-04-13 MED ORDER — ENOXAPARIN SODIUM 40 MG/0.4ML IJ SOSY
40.0000 mg | PREFILLED_SYRINGE | INTRAMUSCULAR | Status: DC
  Administered 2022-04-13 – 2022-04-15 (×3): 40 mg via SUBCUTANEOUS
  Filled 2022-04-13 (×4): qty 0.4

## 2022-04-13 MED ORDER — ASPIRIN 81 MG PO TBEC
81.0000 mg | DELAYED_RELEASE_TABLET | Freq: Every day | ORAL | Status: DC
  Administered 2022-04-13 – 2022-04-17 (×5): 81 mg via ORAL
  Filled 2022-04-13 (×5): qty 1

## 2022-04-13 NOTE — Plan of Care (Signed)
  Problem: Elimination: Goal: Will not experience complications related to bowel motility Outcome: Progressing Goal: Will not experience complications related to urinary retention Outcome: Progressing   Problem: Safety: Goal: Ability to remain free from injury will improve Outcome: Progressing   Problem: Skin Integrity: Goal: Risk for impaired skin integrity will decrease Outcome: Progressing   

## 2022-04-13 NOTE — Progress Notes (Signed)
[name]                                                                               Jason Jones, is a 71 y.o. male, DOB - 28-May-1951, SWF:[id number] Admit date - 04/12/2022    Outpatient Primary MD for the patient is Jason Holms, NP  LOS - 1  days    Brief summary   Jason Jones is a 71 y.o. male with medical history significant of recent CVA, chronic alcohol use, hypertension. GERD, asthma, was recently admitted to  Internal Medicine teaching service  after a right PCA stroke and was discharged to SNF on 10/191/2023. He presents from SNF for generalized weakness, supra pubic pain and hematuria started today.   CT ad and pelvis done showing There is mild hydronephrosis in both kidneys. Both ureters are dilated. There are no opaque renal or ureteral stones. Prominence of collecting systems in both kidneys may suggest partial obstruction at the ureterovesical junctions.   Urology consulted and recommendations given.   Assessment & Plan    Assessment and Plan:  Gross hematuria; Possibly from catheter related trauma.     Hydronephrosis and AKI;  - post obstructive Uropathy.  - hydrate and repeat renal parameters show improvement.  - continue with IV fluids.    Dysphagia:  SLP requested and recommendations given.    Hypertension;  Well controlled.    Asthma, no wheezing heard.    GERD stable.    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  Was discharged on aspirin, plavix and statin. Restarted them.  Will need outpatient follow up with Neurologist, and ziopatch for atrial fibrillation.    Estimated body mass index is 19.19 kg/m as calculated from the following:   Height as of this encounter: '5\' 11"'$  (1.803 m).   Weight as of this encounter: 62.4 kg.  Code Status: full code.  DVT Prophylaxis:  enoxaparin (LOVENOX) injection 40 mg Start: 04/13/22 2200   Level of Care: Level of care: Med-Surg Family Communication:  None at bedside.   Disposition Plan:     Remains inpatient appropriate:  HEMATURIA.   Procedures:  CT abd and pelvis.  US RENAL   Consultants:   Urology.   Antimicrobials:   Anti-infectives (From admission, onward)    Start     Dose/Rate Route Frequency Ordered Stop   04/12/22 1600  cefTRIAXone (ROCEPHIN) 1 g in sodium chloride 0.9 % 100 mL IVPB        1 g 200 mL/hr over 30 Minutes Intravenous  Once 04/12/22 1553 04/12/22 1634        Medications  Scheduled Meds:  amLODipine  5 mg Oral Daily   doxazosin  1 mg Oral Daily   enoxaparin (LOVENOX) injection  40 mg Subcutaneous Q24H    morphine injection  4 mg Intravenous Once   ondansetron (ZOFRAN) IV  4 mg Intravenous Once   polyethylene glycol  17 g Oral Daily   rosuvastatin  20 mg Oral Daily   senna-docusate  2 tablet Oral BID   Continuous Infusions:  lactated ringers 100 mL/hr at 04/13/22 0948   PRN Meds:.acetaminophen, albuterol, food thickener  Subjective:   Jason Jones was seen and examined today.  Reports feeling a little better. No chest pain or sob.  No nausea, or vomiting.some abdominal pain persists  Objective:   Vitals:   04/13/22 0432 04/13/22 0913 04/13/22 1015 04/13/22 1427  BP: 106/75 117/76 106/73 116/73  Pulse: (!) 103 (!) 107 97 (!) 109  Resp: '16 20 20 20  '$ Temp: 99.6 F (37.6 C) 99.1 F (37.3 C) 98.6 F (37 C) 98.8 F (37.1 C)  TempSrc: Oral Oral Oral Oral  SpO2: 95% 96% 95% 95%  Weight:      Height:        Intake/Output Summary (Last 24 hours) at 04/13/2022 1705 Last data filed at 04/13/2022 1000 Gross per 24 hour  Intake 1997.8 ml  Output 1575 ml  Net 422.8 ml   Filed Weights   04/12/22 2021  Weight: 62.4 kg     Exam General exam: Appears calm and comfortable  Respiratory system: Clear to auscultation. Respiratory effort normal. Cardiovascular system: S1 & S2 heard, tachycardia. Marland Kitchen No JVD,  No pedal edema. Gastrointestinal system: Abdomen is nondistended, soft and  nontender.  Normal bowel sounds heard. Central nervous system: Alert and oriented to person and place. Left hemiparesis Extremities: no cyanosis.  Skin: No rashes,  Psychiatry: mood is appropriate.    Data Reviewed:  I have personally reviewed following labs and imaging studies   CBC Lab Results  Component Value Date   WBC 11.5 (H) 04/12/2022   RBC 4.56 04/12/2022   HGB 13.5 04/12/2022   HCT 41.1 04/12/2022   MCV 90.1 04/12/2022   MCH 29.6 04/12/2022   PLT 410 (H) 04/12/2022   MCHC 32.8 04/12/2022   RDW 13.3 04/12/2022   LYMPHSABS 0.4 (L) 04/12/2022   MONOABS 0.6 04/12/2022   EOSABS 0.0 04/12/2022   BASOSABS 0.0 24/02/7352     Last metabolic panel Lab Results  Component Value Date   NA 138 04/13/2022   K 3.8 04/13/2022   CL 107 04/13/2022   CO2 24 04/13/2022   BUN 25 (H) 04/13/2022   CREATININE 1.25 (H) 04/13/2022   GLUCOSE 128 (H) 04/13/2022   GFRNONAA >60 04/13/2022   GFRAA >60 04/21/2017   CALCIUM 9.2 04/13/2022   PHOS 3.4 03/25/2022   PROT 8.6 (H) 04/12/2022   ALBUMIN 3.6 04/12/2022   BILITOT 0.6 04/12/2022   ALKPHOS 59 04/12/2022   AST 28 04/12/2022   ALT 31 04/12/2022   ANIONGAP 7 04/13/2022    CBG (last 3)  No results for input(s): "GLUCAP" in the last 72 hours.    Coagulation Profile: No results for input(s): "INR", "PROTIME" in the last 168 hours.   Radiology Studies: CT Abdomen Pelvis W Contrast  Result Date: 04/12/2022 CLINICAL DATA:  Abdominal pain, hematuria, history of prostate carcinoma EXAM: CT ABDOMEN AND PELVIS WITH CONTRAST TECHNIQUE: Multidetector CT imaging of the abdomen and pelvis was performed using the standard protocol following bolus administration of intravenous contrast. RADIATION DOSE REDUCTION: This exam was performed according to the departmental dose-optimization program which includes automated exposure control, adjustment of the mA and/or kV according to patient size and/or use of iterative reconstruction technique.  CONTRAST:  1104m OMNIPAQUE IOHEXOL 300 MG/ML  SOLN COMPARISON:  Abdomen radiograph done on 03/24/2022 FINDINGS: Lower chest: There are linear patchy densities in left lower lung field. Hepatobiliary: No focal abnormalities are seen in liver. There is no dilation of bile ducts. Motion artifacts limit evaluation of gallbladder. Pancreas: No focal abnormalities are seen. Spleen: Unremarkable.  Adrenals/Urinary Tract: Adrenals are unremarkable. There is mild hydronephrosis in both kidneys. There are no demonstrable renal or ureteral stones. There is marked diffuse wall thickening in urinary bladder. Foley catheter is seen in the bladder. There is increased density in the dependent portion of urinary bladder lumen, possibly blood clot. Prostate is enlarged projecting into the base of the bladder. Small diverticulum is noted in the right lateral margin of the urinary bladder. Stomach/Bowel: Stomach is not distended. Small bowel loops are not dilated. Appendix is unremarkable. There is no significant wall thickening in colon. Scattered diverticula are seen. There are no signs of focal acute diverticulitis. Vascular/Lymphatic: Scattered arterial calcifications are seen. Reproductive: Prostate is enlarged. There are surgical clips in prostate. Other: There is no ascites or pneumoperitoneum. Large umbilical hernia containing fat is seen. Musculoskeletal: Degenerative changes are noted in lumbar spine, more so at L4-L5 level. IMPRESSION: There is no evidence of intestinal obstruction or pneumoperitoneum. There is mild hydronephrosis in both kidneys. Both ureters are dilated. There are no opaque renal or ureteral stones. Prominence of collecting systems in both kidneys may suggest partial obstruction at the ureterovesical junctions. There is marked diffuse wall thickening in urinary bladder which may be due to chronic bladder outlet obstruction or cystitis. High density in the dependent portion of the bladder lumen may suggest  blood products. Foley catheter is seen in the bladder. Prostate is enlarged projecting into the base of the urinary bladder. Linear patchy infiltrate in left lower lung fields may suggest atelectasis/pneumonia. Diverticulosis of colon without signs of focal diverticulitis. Other findings as described in the body of the report. Electronically Signed   By: Elmer Picker M.D.   On: 04/12/2022 15:42       Hosie Poisson M.D. [name] 04/13/2022, 5:05 PM  Available via Epic secure chat 7am-7pm After 7 pm, please refer to night coverage provider listed on amion.

## 2022-04-13 NOTE — Evaluation (Signed)
Clinical/Bedside Swallow Evaluation Patient Details  Name: Jason Jones MRN: [id number] Date of Birth: Apr 19, 1951  Today's Date: 04/13/2022 Time: SLP Start Time (ACUTE ONLY): 87 SLP Stop Time (ACUTE ONLY): 2409 SLP Time Calculation (min) (ACUTE ONLY): 15 min  Past Medical History:  Past Medical History:  Diagnosis Date   Arthritis    Asthma    Cancer (Early)    Chronic alcohol abuse    Dyspnea    GERD (gastroesophageal reflux disease)    PRN  ---  TAKES BAKING SODA IN WATER   History of pneumothorax    04-28-2006  fell, left fx rib--  resolved w/ chest tube   Hypertension    Nocturia    Poor dental hygiene    Right inguinal hernia    Weak urinary stream    Past Surgical History:  Past Surgical History:  Procedure Laterality Date   COLONOSCOPY  01/08/2021   GOLD SEED IMPLANT N/A 05/23/2021   Procedure: GOLD SEED IMPLANT;  Surgeon: Ceasar Mons, MD;  Location: [facility];  Service: Urology;  Laterality: N/A;   INGUINAL HERNIA REPAIR Right 04/23/2017   Procedure: OPEN RIGHT INGUINAL HERNIA REPAIR WITH MESH;  Surgeon: Kinsinger, Arta Bruce, MD;  Location: [facility];  Service: General;  Laterality: Right;  GENERAL COMBINED WITH REGIONAL FOR POST OP PAIN    INSERTION OF MESH Right 04/23/2017   Procedure: INSERTION OF MESH;  Surgeon: Kinsinger, Arta Bruce, MD;  Location: [facility];  Service: General;  Laterality: Right;  GENERAL COMBINED WITH REGIONAL FOR POST OP PAIN    LIPOMA EXCISION Right 02/22/2015   Procedure: RIGHT SHOULDER MASS EXCISION 15 CM SQ;  Surgeon: Mickeal Skinner, MD;  Location[facility];  Service: General;  Laterality: Right;   SPACE OAR INSTILLATION N/A 05/23/2021   Procedure: SPACE OAR INSTILLATION;  Surgeon: Ceasar Mons, MD;  Location: [facility];  Service: Urology;  Laterality: N/A;   TRANSRECTAL ULTRASOUND N/A 05/23/2021   Procedure: TRANSRECTAL ULTRASOUND;  Surgeon: Ceasar Mons, MD;  Location: [facility];  Service: Urology;  Laterality: N/A;   HPI:  Patient is a 71 y.o. male with PMH: recent CVA with left sided paresis, dysphagia, dysarthria, chronic alcohol use, HTN, GERD, asthma. He has been at a SNF since CVA. He presented to the hospital on 04/12/22 from his SNF with generalized weakness, supra pubic pain and hematuria. He reports being constipated and that he had not had a BM for 5 days. In ED, he was afebrile, tachycardic and normotensive. He was diagnosed with gross hematuria likely related to traumatic catheter manipulation and friable post-radiation prostatic tissue.    Assessment / Plan / Recommendation  Clinical Impression  Patient presenting with clinical s/s of dysphagia as per this swallow evaluation at bedside. Currently he exhibits swallow initation delays with liquids but not observable difference between thin and nectar thick liquids. No significant amount of holding, or anterior spillage with liquids despite patient being impulsive. No overt s/s aspiration or penetration observed. As patient remains impulsive and flaccid on left as he did following CVA last month, SLP recommending continue on Dys 1 (puree) solids and nectar thick liquids with plan to determine benefit from repeat MBS. SLP Visit Diagnosis: Dysphagia, unspecified (R13.10)    Aspiration Risk  Mild aspiration risk    Diet Recommendation Dysphagia 1 (Puree);Nectar-thick liquid   Liquid Administration via: Cup Medication Administration: Whole meds with puree Supervision: Staff to assist with self feeding;Full supervision/cueing for compensatory strategies;Patient  able to self feed Compensations: Minimize environmental distractions;Slow rate;Small sips/bites Postural Changes: Seated upright at 90 degrees;Remain upright for at least 30 minutes after po intake    Other  Recommendations Oral Care Recommendations: Oral care BID Other Recommendations: Order thickener from  pharmacy;Clarify dietary restrictions;Prohibited food (jello, ice cream, thin soups)    Recommendations for follow up therapy are one component of a multi-disciplinary discharge planning process, led by the attending physician.  Recommendations may be updated based on patient status, additional functional criteria and insurance authorization.  Follow up Recommendations Skilled nursing-short term rehab (<3 hours/day)      Assistance Recommended at Discharge Frequent or constant Supervision/Assistance  Functional Status Assessment Patient has had a recent decline in their functional status and demonstrates the ability to make significant improvements in function in a reasonable and predictable amount of time.  Frequency and Duration min 1 x/week  1 week       Prognosis Prognosis for Safe Diet Advancement: Fair Barriers to Reach Goals: Time post onset;Severity of deficits      Swallow Study   General Date of Onset: 04/13/22 HPI: Patient is a 71 y.o. male with PMH: recent CVA with left sided paresis, dysphagia, dysarthria, chronic alcohol use, HTN, GERD, asthma. He has been at a SNF since CVA. He presented to the hospital on 04/12/22 from his SNF with generalized weakness, supra pubic pain and hematuria. He reports being constipated and that he had not had a BM for 5 days. In ED, he was afebrile, tachycardic and normotensive. He was diagnosed with gross hematuria likely related to traumatic catheter manipulation and friable post-radiation prostatic tissue. Type of Study: Bedside Swallow Evaluation Previous Swallow Assessment: during previous hospitalization following CVA Diet Prior to this Study: Nectar-thick liquids;Dysphagia 1 (puree) Temperature Spikes Noted: No Respiratory Status: Room air History of Recent Intubation: No Behavior/Cognition: Alert;Cooperative;Impulsive Oral Cavity Assessment: Within Functional Limits Oral Care Completed by SLP: No Oral Cavity - Dentition:  Edentulous Vision: Functional for self-feeding Self-Feeding Abilities: Able to feed self Patient Positioning: Upright in bed Baseline Vocal Quality: Normal;Hoarse Volitional Cough: Strong Volitional Swallow: Able to elicit    Oral/Motor/Sensory Function Overall Oral Motor/Sensory Function: Moderate impairment Facial ROM: Reduced left Facial Symmetry: Abnormal symmetry left Facial Strength: Reduced left   Ice Chips     Thin Liquid Thin Liquid: Impaired Presentation: Cup Pharyngeal  Phase Impairments: Suspected delayed Swallow;Multiple swallows    Nectar Thick Nectar Thick Liquid: Impaired Pharyngeal Phase Impairments: Suspected delayed Swallow   Honey Thick     Puree Puree: Not tested   Solid     Solid: Not tested      Sonia Baller, MA, CCC-SLP Speech Therapy

## 2022-04-13 NOTE — Progress Notes (Addendum)
Patients daughter would like to be called with an update at [phone] [name]).

## 2022-04-13 NOTE — Evaluation (Signed)
Occupational Therapy Evaluation Patient Details Name: Jason Jones MRN: [id number] DOB: Jun 08, 1951 Today's Date: 04/13/2022   History of Present Illness Admitted for AKI and hematuria. Jason Jones is a 71 y.o. male with medical history significant of recent CVA, chronic alcohol use, hypertension. GERD, asthma, was recently admitted to  Internal Medicine teaching service  after a right PCA stroke and was discharged to SNF on 10/191/2023. He presents from SNF for generalized weakness, supra pubic pain and hematuria started today.   Clinical Impression   Jason Jones presents with dense left sided heniplegia without functional use of left upper extremity. He also has visual deficits and potential cognitive deficits. He was independent prior to stroke and now has been at local SNF working with therapy. On evaluation he requires most of his ADLs to be performed at bed level with mod-total assist, and is max assist for bed transfer and max with +2 physical assistance to pivot to the recliner. No movement was noted in his LUE and he is wearing a soft resting hand splint. He reports pain when therapist attempts to stretch his fingers. He did not exhibit any movement in his left lower extremity in supine but did exhibit minimal him movement once seated in the chair. Patient will benefit from skilled OT services while in hospital to improve deficits and learn compensatory strategies as needed in order to return to PLOF.   Recommend patient return to facility for continues therapy.      Recommendations for follow up therapy are one component of a multi-disciplinary discharge planning process, led by the attending physician.  Recommendations may be updated based on patient status, additional functional criteria and insurance authorization.   Follow Up Recommendations  Skilled nursing-short term rehab (<3 hours/day)    Assistance Recommended at Discharge Frequent or constant Supervision/Assistance   Patient can return home with the following Two people to help with walking and/or transfers;Two people to help with bathing/dressing/bathroom;Assistance with cooking/housework;Direct supervision/assist for medications management;Direct supervision/assist for financial management;Assist for transportation;Help with stairs or ramp for entrance;Assistance with feeding    Functional Status Assessment  Patient has had a recent decline in their functional status and demonstrates the ability to make significant improvements in function in a reasonable and predictable amount of time.  Equipment Recommendations  None recommended by OT    Recommendations for Other Services       Precautions / Restrictions Precautions Precautions: Fall Precaution Comments: L hemiparesis, L hemianopsia Restrictions Weight Bearing Restrictions: No      Mobility Bed Mobility Overal bed mobility: Needs Assistance Bed Mobility: Supine to Sit     Supine to sit: Max assist, +2 for safety/equipment, HOB elevated          Transfers Overall transfer level: Needs assistance Equipment used: 2 person hand held assist Transfers: Sit to/from Stand, Bed to chair/wheelchair/BSC Sit to Stand: Max assist, +2 physical assistance, +2 safety/equipment Stand pivot transfers: Max assist, +2 physical assistance, +2 safety/equipment         General transfer comment: +2 physical assistance to stand pivot to recilner placed on patient's right. Required Left knee blocking.      Balance Overall balance assessment: Needs assistance Sitting-balance support: No upper extremity supported, Feet supported Sitting balance-Leahy Scale: Fair     Standing balance support: Bilateral upper extremity supported, During functional activity Standing balance-Leahy Scale: Zero  ADL either performed or assessed with clinical judgement   ADL Overall ADL's : Needs  assistance/impaired Eating/Feeding: Set up;Sitting   Grooming: Set up;Sitting   Upper Body Bathing: Moderate assistance;Bed level   Lower Body Bathing: Total assistance;Bed level   Upper Body Dressing : Moderate assistance;Bed level   Lower Body Dressing: Bed level;Total assistance   Toilet Transfer: Maximal assistance;+2 for physical assistance;+2 for safety/equipment;Stand-pivot;BSC/3in1   Toileting- Clothing Manipulation and Hygiene: Total assistance;+2 for physical assistance;+2 for safety/equipment;Bed level;Sit to/from stand       Functional mobility during ADLs: Maximal assistance;+2 for physical assistance;+2 for safety/equipment;Cueing for safety;Cueing for sequencing       Vision   Additional Comments: Left sided visual deficits noted - has deficits in each eye. Unabel to accurately count finges in left and right visual fied.. Neuro note states Right hemianopsia     Perception     Praxis      Pertinent Vitals/Pain Pain Assessment Pain Assessment: Faces Faces Pain Scale: Hurts little more Pain Location: penis Pain Intervention(s): Monitored during session     Hand Dominance Right   Extremity/Trunk Assessment Upper Extremity Assessment Upper Extremity Assessment: LUE deficits/detail;RUE deficits/detail RUE Deficits / Details: WFL ROM and strength RUE Sensation: WNL RUE Coordination: WNL LUE Deficits / Details: Flaccid LUE - no movemennt noted. Found in resting hand splint. LUE Sensation:  (He reports normal sensation. but did not formally assess) LUE Coordination: decreased fine motor;decreased gross motor   Lower Extremity Assessment Lower Extremity Assessment: Defer to PT evaluation   Cervical / Trunk Assessment Cervical / Trunk Exceptions: Forward head posture with rounded shoulders.   Communication Communication Communication: Expressive difficulties   Cognition Arousal/Alertness: Awake/alert Behavior During Therapy: WFL for tasks  assessed/performed Overall Cognitive Status: No family/caregiver present to determine baseline cognitive functioning                                       General Comments       Exercises     Shoulder Instructions      Home Living Family/patient expects to be discharged to:: Skilled nursing facility                                        Prior Functioning/Environment Prior Level of Function : Needs assist             Mobility Comments: needs assistance to transfer to wc ADLs Comments: needs assistance for all ADLs        OT Problem List: Decreased strength;Decreased range of motion;Decreased activity tolerance;Impaired balance (sitting and/or standing);Impaired vision/perception;Decreased coordination;Decreased cognition;Decreased safety awareness;Decreased knowledge of use of DME or AE;Decreased knowledge of precautions;Impaired sensation;Impaired tone;Impaired UE functional use;Pain      OT Treatment/Interventions: Self-care/ADL training;Neuromuscular education;DME and/or AE instruction;Therapeutic activities;Cognitive remediation/compensation;Visual/perceptual remediation/compensation;Patient/family education;Balance training    OT Goals(Current goals can be found in the care plan section) Acute Rehab OT Goals Patient Stated Goal: get better OT Goal Formulation: With patient Time For Goal Achievement: 04/27/22 Potential to Achieve Goals: Fair  OT Frequency: Min 2X/week    Co-evaluation              AM-PAC OT "6 Clicks" Daily Activity     Outcome Measure Help from another person eating meals?: A Little Help from another person taking care of personal grooming?:  A Little Help from another person toileting, which includes using toliet, bedpan, or urinal?: Total Help from another person bathing (including washing, rinsing, drying)?: A Lot Help from another person to put on and taking off regular upper body clothing?: A Lot Help  from another person to put on and taking off regular lower body clothing?: Total 6 Click Score: 12   End of Session Nurse Communication: Mobility status (how to transfer patient back to bed)  Activity Tolerance: Patient tolerated treatment well Patient left: in bed;with call bell/phone within reach;with bed alarm set  OT Visit Diagnosis: Other abnormalities of gait and mobility (R26.89);Muscle weakness (generalized) (M62.81);Hemiplegia and hemiparesis;Other symptoms and signs involving cognitive function;History of falling (Z91.81) Hemiplegia - Right/Left: Left Hemiplegia - dominant/non-dominant: Non-Dominant Hemiplegia - caused by: Cerebral infarction                Time: 1047-1103 OT Time Calculation (min): 16 min Charges:  OT General Charges $OT Visit: 1 Visit OT Evaluation $OT Eval Low Complexity: 1 Low  Jason Jones, OTR/L [place]  Office [phone]   Jason Jones 04/13/2022, 2:54 PM

## 2022-04-13 NOTE — Progress Notes (Signed)
MD notified due to patients increase confusion and agitation. Patient is confused to place and situation, and it is very hard to reorient patient. Patient has called out repeatedly asking the same questions. Will need something to help with agitation or sleep.

## 2022-04-14 ENCOUNTER — Inpatient Hospital Stay (HOSPITAL_COMMUNITY): Payer: Medicare Other

## 2022-04-14 DIAGNOSIS — N179 Acute kidney failure, unspecified: Secondary | ICD-10-CM | POA: Diagnosis not present

## 2022-04-14 DIAGNOSIS — N3091 Cystitis, unspecified with hematuria: Secondary | ICD-10-CM | POA: Diagnosis not present

## 2022-04-14 DIAGNOSIS — N1330 Unspecified hydronephrosis: Secondary | ICD-10-CM | POA: Diagnosis not present

## 2022-04-14 LAB — CBC WITH DIFFERENTIAL/PLATELET
Abs Immature Granulocytes: 0.02 10*3/uL (ref 0.00–0.07)
Basophils Absolute: 0 10*3/uL (ref 0.0–0.1)
Basophils Relative: 1 %
Eosinophils Absolute: 0.5 10*3/uL (ref 0.0–0.5)
Eosinophils Relative: 8 %
HCT: 33.3 % — ABNORMAL LOW (ref 39.0–52.0)
Hemoglobin: 10.9 g/dL — ABNORMAL LOW (ref 13.0–17.0)
Immature Granulocytes: 0 %
Lymphocytes Relative: 19 %
Lymphs Abs: 1.3 10*3/uL (ref 0.7–4.0)
MCH: 29.9 pg (ref 26.0–34.0)
MCHC: 32.7 g/dL (ref 30.0–36.0)
MCV: 91.2 fL (ref 80.0–100.0)
Monocytes Absolute: 0.6 10*3/uL (ref 0.1–1.0)
Monocytes Relative: 9 %
Neutro Abs: 4.1 10*3/uL (ref 1.7–7.7)
Neutrophils Relative %: 63 %
Platelets: 348 10*3/uL (ref 150–400)
RBC: 3.65 MIL/uL — ABNORMAL LOW (ref 4.22–5.81)
RDW: 13.2 % (ref 11.5–15.5)
WBC: 6.5 10*3/uL (ref 4.0–10.5)
nRBC: 0 % (ref 0.0–0.2)

## 2022-04-14 LAB — COMPREHENSIVE METABOLIC PANEL
ALT: 27 U/L (ref 0–44)
AST: 26 U/L (ref 15–41)
Albumin: 2.9 g/dL — ABNORMAL LOW (ref 3.5–5.0)
Alkaline Phosphatase: 43 U/L (ref 38–126)
Anion gap: 5 (ref 5–15)
BUN: 20 mg/dL (ref 8–23)
CO2: 26 mmol/L (ref 22–32)
Calcium: 8.8 mg/dL — ABNORMAL LOW (ref 8.9–10.3)
Chloride: 108 mmol/L (ref 98–111)
Creatinine, Ser: 0.88 mg/dL (ref 0.61–1.24)
GFR, Estimated: >60 mL/min (ref >60)
Glucose, Bld: 106 mg/dL — ABNORMAL HIGH (ref 70–99)
Potassium: 3.6 mmol/L (ref 3.5–5.1)
Sodium: 139 mmol/L (ref 135–145)
Total Bilirubin: 0.5 mg/dL (ref 0.3–1.2)
Total Protein: 6.8 g/dL (ref 6.5–8.1)

## 2022-04-14 LAB — URINE CULTURE: Culture: >=100000 — AB

## 2022-04-14 MED ORDER — LORAZEPAM 2 MG/ML IJ SOLN
0.5000 mg | Freq: Once | INTRAMUSCULAR | Status: AC
  Administered 2022-04-14: 0.5 mg via INTRAVENOUS
  Filled 2022-04-14: qty 1

## 2022-04-14 MED ORDER — CHLORHEXIDINE GLUCONATE CLOTH 2 % EX PADS
6.0000 | MEDICATED_PAD | Freq: Every day | CUTANEOUS | Status: DC
  Administered 2022-04-14 – 2022-04-17 (×4): 6 via TOPICAL

## 2022-04-14 NOTE — Progress Notes (Signed)
[name]                                                                               Jason Jones, is a 71 y.o. male, DOB - 10-10-1950, [id number] Admit date - 04/12/2022    Outpatient Primary MD for the patient is Arthur Holms, NP  LOS - 2  days    Brief summary   Jason Jones is a 71 y.o. male with medical history significant of recent CVA, chronic alcohol use, hypertension. GERD, asthma, was recently admitted to  Internal Medicine teaching service  after a right PCA stroke and was discharged to SNF on 10/191/2023. He presents from SNF for generalized weakness, supra pubic pain and hematuria started today.   CT ad and pelvis done showing There is mild hydronephrosis in both kidneys. Both ureters are dilated. There are no opaque renal or ureteral stones. Prominence of collecting systems in both kidneys may suggest partial obstruction at the ureterovesical junctions.   Urology consulted and recommendations given.   Assessment & Plan    Assessment and Plan:  Gross hematuria; Possibly from catheter related trauma.  Urine still pink    Hydronephrosis and AKI;  - post obstructive Uropathy.  - hydrate and repeat renal parameters show improvement and creatinine back to baseline.  - continue with IV fluids.    Dysphagia:  SLP requested and recommendations given.    Hypertension;  Well controlled.    Asthma, no wheezing heard.    GERD stable.    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  Was discharged on aspirin, plavix and statin. Restarted them.  Will need outpatient follow up with Neurologist, and ziopatch for atrial fibrillation.    Estimated body mass index is 19.19 kg/m as calculated from the following:   Height as of this encounter: '5\' 11"'$  (1.803 m).   Weight as of this encounter: 62.4 kg.  Code Status: full code.  DVT Prophylaxis:  enoxaparin (LOVENOX) injection 40 mg Start: 04/13/22 2200   Level of  Care: Level of care: Med-Surg Family Communication: None at bedside.   Disposition Plan:     Remains inpatient appropriate:  HEMATURIA.   Procedures:  CT abd and pelvis.  US RENAL   Consultants:   Urology.   Antimicrobials:   Anti-infectives (From admission, onward)    Start     Dose/Rate Route Frequency Ordered Stop   04/12/22 1600  cefTRIAXone (ROCEPHIN) 1 g in sodium chloride 0.9 % 100 mL IVPB        1 g 200 mL/hr over 30 Minutes Intravenous  Once 04/12/22 1553 04/12/22 1634        Medications  Scheduled Meds:  amLODipine  5 mg Oral Daily   aspirin EC  81 mg Oral Daily   Chlorhexidine Gluconate Cloth  6 each Topical Daily   clopidogrel  75 mg Oral Daily   doxazosin  1 mg Oral Daily   enoxaparin (LOVENOX) injection  40 mg Subcutaneous Q24H    morphine injection  4 mg Intravenous Once   ondansetron (ZOFRAN) IV  4 mg Intravenous Once   polyethylene glycol  17 g Oral Daily  rosuvastatin  20 mg Oral Daily   senna-docusate  2 tablet Oral BID   Continuous Infusions:  sodium chloride 100 mL/hr at 04/14/22 1359   PRN Meds:.acetaminophen, albuterol, food thickener, sodium chloride    Subjective:   Jason Jones was seen and examined today.  No new complaints.   Objective:   Vitals:   04/13/22 1427 04/13/22 2051 04/14/22 0542 04/14/22 1426  BP: 116/73 115/72 (!) 143/88 113/82  Pulse: (!) 109 94 93 86  Resp: '20 20 18 15  '$ Temp: 98.8 F (37.1 C) 99.4 F (37.4 C) 98.5 F (36.9 C) 98.4 F (36.9 C)  TempSrc: Oral Oral Oral Oral  SpO2: 95% 93% 94% 92%  Weight:      Height:        Intake/Output Summary (Last 24 hours) at 04/14/2022 1815 Last data filed at 04/14/2022 1739 Gross per 24 hour  Intake 1558.89 ml  Output 3425 ml  Net -1866.11 ml    Filed Weights   04/12/22 2021  Weight: 62.4 kg     Exam General exam: Appears calm and comfortable  Respiratory system: Clear to auscultation. Respiratory effort normal. Cardiovascular system: S1 & S2 heard,  RRR. No JVD, murmurs, rubs, gallops or clicks. No pedal edema. Gastrointestinal system: Abdomen is nondistended, soft and nontender.  Normal bowel sounds heard. Central nervous system: Alert and oriented. Left hemiparesis.  Extremities: Symmetric 5 x 5 power. Skin: No rashes, lesions or ulcers Psychiatry:  Mood & affect appropriate.     Data Reviewed:  I have personally reviewed following labs and imaging studies   CBC Lab Results  Component Value Date   WBC 6.5 04/14/2022   RBC 3.65 (L) 04/14/2022   HGB 10.9 (L) 04/14/2022   HCT 33.3 (L) 04/14/2022   MCV 91.2 04/14/2022   MCH 29.9 04/14/2022   PLT 348 04/14/2022   MCHC 32.7 04/14/2022   RDW 13.2 04/14/2022   LYMPHSABS 1.3 04/14/2022   MONOABS 0.6 04/14/2022   EOSABS 0.5 04/14/2022   BASOSABS 0.0 02/58/5277     Last metabolic panel Lab Results  Component Value Date   NA 139 04/14/2022   K 3.6 04/14/2022   CL 108 04/14/2022   CO2 26 04/14/2022   BUN 20 04/14/2022   CREATININE 0.88 04/14/2022   GLUCOSE 106 (H) 04/14/2022   GFRNONAA >60 04/14/2022   GFRAA >60 04/21/2017   CALCIUM 8.8 (L) 04/14/2022   PHOS 3.4 03/25/2022   PROT 6.8 04/14/2022   ALBUMIN 2.9 (L) 04/14/2022   BILITOT 0.5 04/14/2022   ALKPHOS 43 04/14/2022   AST 26 04/14/2022   ALT 27 04/14/2022   ANIONGAP 5 04/14/2022    CBG (last 3)  No results for input(s): "GLUCAP" in the last 72 hours.    Coagulation Profile: No results for input(s): "INR", "PROTIME" in the last 168 hours.   Radiology Studies: No results found.     Hosie Poisson M.D. [name] 04/14/2022, 6:15 PM  Available via Epic secure chat 7am-7pm After 7 pm, please refer to night coverage provider listed on amion.

## 2022-04-14 NOTE — Evaluation (Signed)
Physical Therapy Evaluation Patient Details Name: Jason Jones MRN: [id number] DOB: Oct 11, 1950 Today's Date: 04/14/2022  History of Present Illness  Pt is a 71 y.o. male admitted for AKI and hematuria. Past medical history significant of chronic alcohol use, prostate CA (2022) s/p external beam radiation (completed feb 2023), prior hepatitis C, HTN, tobacco use disorder and was recently admitted to [facility] after a right PCA stroke and was discharged to SNF on 03/28/2022. He presented from SNF for generalized weakness, supra pubic pain and hematuria on 04/12/22.  Clinical Impression  Pt admitted with above diagnosis.  Pt currently with functional limitations due to the deficits listed below (see PT Problem List). Pt will benefit from skilled PT to increase their independence and safety with mobility to allow discharge to the venue listed below.  Pt assisted with sitting EOB requiring total assist +2, however mostly keeping eyes closed with movement.  When asked to open eyes ,pt reports poor vision and immediately moving trunk backwards to return to bed.  Pt with recent R PCA CVA and discharged to SNF.  Recommend pt return to SNF upon d/c.        Recommendations for follow up therapy are one component of a multi-disciplinary discharge planning process, led by the attending physician.  Recommendations may be updated based on patient status, additional functional criteria and insurance authorization.  Follow Up Recommendations Skilled nursing-short term rehab (<3 hours/day) Can patient physically be transported by private vehicle: No    Assistance Recommended at Discharge Frequent or constant Supervision/Assistance  Patient can return home with the following  Two people to help with walking and/or transfers;Two people to help with bathing/dressing/bathroom;Assistance with cooking/housework;Assist for transportation;Help with stairs or ramp for entrance    Equipment Recommendations Other (comment)  (next venue)  Recommendations for Other Services       Functional Status Assessment Patient has had a recent decline in their functional status and demonstrates the ability to make significant improvements in function in a reasonable and predictable amount of time.     Precautions / Restrictions Precautions Precautions: Fall Precaution Comments: L hemiplegia, R hemianopsia (from previous admission) Required Braces or Orthoses: Splint/Cast Splint/Cast: left arm splint in place on arrival Restrictions Weight Bearing Restrictions: No      Mobility  Bed Mobility Overal bed mobility: Needs Assistance Bed Mobility: Supine to Sit, Sit to Supine Rolling: Total assist, +2 for physical assistance   Supine to sit: Total assist, +2 for physical assistance     General bed mobility comments: cues for self assisting with Right extremities however pt not able; required total assist for upper and lower body    Transfers                        Ambulation/Gait                  Stairs            Wheelchair Mobility    Modified Rankin (Stroke Patients Only)       Balance Overall balance assessment: Needs assistance Sitting-balance support: No upper extremity supported, Feet supported Sitting balance-Leahy Scale: Fair Sitting balance - Comments: able to sit unsupported briefly, requested pt open his eyes and looks at therapist however upon doing this pt states yes to vision deficits and trunk leaned backwards in attempts to return to supine (assisted safely back to bed)  Pertinent Vitals/Pain Pain Assessment Pain Assessment: No/denies pain Pain Intervention(s): Repositioned, Monitored during session    Home Living Family/patient expects to be discharged to:: Skilled nursing facility                        Prior Function Prior Level of Function : Needs assist             Mobility Comments:  needs assistance to transfer to wc ADLs Comments: needs assistance for all ADLs     Hand Dominance   Dominant Hand: Right    Extremity/Trunk Assessment        Lower Extremity Assessment Lower Extremity Assessment: LLE deficits/detail LLE Deficits / Details: no active movement in L LE in supine; clonus with passive DF       Communication   Communication: Expressive difficulties  Cognition Arousal/Alertness: Lethargic Behavior During Therapy: Flat affect Overall Cognitive Status: No family/caregiver present to determine baseline cognitive functioning                                 General Comments: pt mostly kept eyes closed despite being agreeable to mobilize, RN reports pt did not sleep well last night; pt with little verbalizations        General Comments      Exercises     Assessment/Plan    PT Assessment Patient needs continued PT services  PT Problem List Decreased strength;Decreased range of motion;Decreased activity tolerance;Decreased balance;Decreased mobility;Decreased safety awareness;Decreased knowledge of precautions;Pain;Decreased coordination;Decreased cognition;Decreased knowledge of use of DME       PT Treatment Interventions DME instruction;Gait training;Functional mobility training;Therapeutic activities;Therapeutic exercise;Balance training;Neuromuscular re-education;Cognitive remediation;Patient/family education;Wheelchair mobility training    PT Goals (Current goals can be found in the Care Plan section)  Acute Rehab PT Goals PT Goal Formulation: With patient Time For Goal Achievement: 04/28/22 Potential to Achieve Goals: Fair    Frequency Min 2X/week     Co-evaluation               AM-PAC PT "6 Clicks" Mobility  Outcome Measure Help needed turning from your back to your side while in a flat bed without using bedrails?: Total Help needed moving from lying on your back to sitting on the side of a flat bed without  using bedrails?: Total Help needed moving to and from a bed to a chair (including a wheelchair)?: Total Help needed standing up from a chair using your arms (e.g., wheelchair or bedside chair)?: Total Help needed to walk in hospital room?: Total Help needed climbing 3-5 steps with a railing? : Total 6 Click Score: 6    End of Session   Activity Tolerance: Patient limited by fatigue Patient left: in bed;with call bell/phone within reach;with bed alarm set Nurse Communication: Mobility status PT Visit Diagnosis: Other abnormalities of gait and mobility (R26.89);Hemiplegia and hemiparesis Hemiplegia - Right/Left: Left Hemiplegia - caused by: Cerebral infarction    Time: 1038-1050 PT Time Calculation (min) (ACUTE ONLY): 12 min   Charges:   PT Evaluation $PT Eval Low Complexity: 1 Low     Kati PT, DPT Physical Therapist Acute Rehabilitation Services Preferred contact method: Secure Chat Weekend Pager Only: [phone] Office: [place] 04/14/2022, 11:40 AM

## 2022-04-14 NOTE — Progress Notes (Signed)
Subjective: No acute events overnight. Catheter draining light tea color. Cr 0.88. Ucx still pending. Mr Jason Jones denies issues this morning  Objective: Vital signs in last 24 hours: Temp:  [98.5 F (36.9 C)-99.4 F (37.4 C)] 98.5 F (36.9 C) (11/05 0542) Pulse Rate:  [93-109] 93 (11/05 0542) Resp:  [18-20] 18 (11/05 0542) BP: (106-143)/(72-88) 143/88 (11/05 0542) SpO2:  [93 %-95 %] 94 % (11/05 0542)  Intake/Output from previous day: 11/04 0701 - 11/05 0700 In: 2176.9 [P.O.:978; I.V.:1198.9] Out: 1725 [Urine:1725] Intake/Output this shift: Total I/O In: 240 [P.O.:240] Out: -   Physical Exam:  General: Alert and oriented CV: RRR Lungs: Clear Abdomen: Soft, ND GU: foley catheter in place draining tea-colored urine Ext: NT, No erythema  Lab Results: Recent Labs    04/12/22 1346 04/13/22 1854 04/14/22 0553  HGB 13.5 10.3* 10.9*  HCT 41.1 31.9* 33.3*   BMET Recent Labs    04/13/22 0837 04/14/22 0553  NA 138 139  K 3.8 3.6  CL 107 108  CO2 24 26  GLUCOSE 128* 106*  BUN 25* 20  CREATININE 1.25* 0.88  CALCIUM 9.2 8.8*     Studies/Results: CT Abdomen Pelvis W Contrast  Result Date: 04/12/2022 CLINICAL DATA:  Abdominal pain, hematuria, history of prostate carcinoma EXAM: CT ABDOMEN AND PELVIS WITH CONTRAST TECHNIQUE: Multidetector CT imaging of the abdomen and pelvis was performed using the standard protocol following bolus administration of intravenous contrast. RADIATION DOSE REDUCTION: This exam was performed according to the departmental dose-optimization program which includes automated exposure control, adjustment of the mA and/or kV according to patient size and/or use of iterative reconstruction technique. CONTRAST:  150m OMNIPAQUE IOHEXOL 300 MG/ML  SOLN COMPARISON:  Abdomen radiograph done on 03/24/2022 FINDINGS: Lower chest: There are linear patchy densities in left lower lung field. Hepatobiliary: No focal abnormalities are seen in liver. There is no  dilation of bile ducts. Motion artifacts limit evaluation of gallbladder. Pancreas: No focal abnormalities are seen. Spleen: Unremarkable. Adrenals/Urinary Tract: Adrenals are unremarkable. There is mild hydronephrosis in both kidneys. There are no demonstrable renal or ureteral stones. There is marked diffuse wall thickening in urinary bladder. Foley catheter is seen in the bladder. There is increased density in the dependent portion of urinary bladder lumen, possibly blood clot. Prostate is enlarged projecting into the base of the bladder. Small diverticulum is noted in the right lateral margin of the urinary bladder. Stomach/Bowel: Stomach is not distended. Small bowel loops are not dilated. Appendix is unremarkable. There is no significant wall thickening in colon. Scattered diverticula are seen. There are no signs of focal acute diverticulitis. Vascular/Lymphatic: Scattered arterial calcifications are seen. Reproductive: Prostate is enlarged. There are surgical clips in prostate. Other: There is no ascites or pneumoperitoneum. Large umbilical hernia containing fat is seen. Musculoskeletal: Degenerative changes are noted in lumbar spine, more so at L4-L5 level. IMPRESSION: There is no evidence of intestinal obstruction or pneumoperitoneum. There is mild hydronephrosis in both kidneys. Both ureters are dilated. There are no opaque renal or ureteral stones. Prominence of collecting systems in both kidneys may suggest partial obstruction at the ureterovesical junctions. There is marked diffuse wall thickening in urinary bladder which may be due to chronic bladder outlet obstruction or cystitis. High density in the dependent portion of the bladder lumen may suggest blood products. Foley catheter is seen in the bladder. Prostate is enlarged projecting into the base of the urinary bladder. Linear patchy infiltrate in left lower lung fields may suggest atelectasis/pneumonia. Diverticulosis of colon  without signs of  focal diverticulitis. Other findings as described in the body of the report. Electronically Signed   By: Elmer Picker M.D.   On: 04/12/2022 15:42    Assessment/Plan:    Gross hematuria, improving/resolved Likely related to traumatic catheter manipulation and friable post-radiation prostatic tissue Continue foley catheter until outpatient follow-up with Urology Alert Urology if urine darkens considerably or catheter stops draining. 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

## 2022-04-14 NOTE — Plan of Care (Signed)

## 2022-04-15 DIAGNOSIS — E440 Moderate protein-calorie malnutrition: Secondary | ICD-10-CM | POA: Insufficient documentation

## 2022-04-15 LAB — CBC WITH DIFFERENTIAL/PLATELET
Abs Immature Granulocytes: 0.01 10*3/uL (ref 0.00–0.07)
Basophils Absolute: 0 10*3/uL (ref 0.0–0.1)
Basophils Relative: 1 %
Eosinophils Absolute: 0.5 10*3/uL (ref 0.0–0.5)
Eosinophils Relative: 10 %
HCT: 34.5 % — ABNORMAL LOW (ref 39.0–52.0)
Hemoglobin: 11 g/dL — ABNORMAL LOW (ref 13.0–17.0)
Immature Granulocytes: 0 %
Lymphocytes Relative: 16 %
Lymphs Abs: 0.9 10*3/uL (ref 0.7–4.0)
MCH: 29.5 pg (ref 26.0–34.0)
MCHC: 31.9 g/dL (ref 30.0–36.0)
MCV: 92.5 fL (ref 80.0–100.0)
Monocytes Absolute: 0.5 10*3/uL (ref 0.1–1.0)
Monocytes Relative: 9 %
Neutro Abs: 3.4 10*3/uL (ref 1.7–7.7)
Neutrophils Relative %: 64 %
Platelets: 347 10*3/uL (ref 150–400)
RBC: 3.73 MIL/uL — ABNORMAL LOW (ref 4.22–5.81)
RDW: 12.9 % (ref 11.5–15.5)
WBC: 5.3 10*3/uL (ref 4.0–10.5)
nRBC: 0 % (ref 0.0–0.2)

## 2022-04-15 LAB — BASIC METABOLIC PANEL
Anion gap: 7 (ref 5–15)
BUN: 13 mg/dL (ref 8–23)
CO2: 27 mmol/L (ref 22–32)
Calcium: 8.9 mg/dL (ref 8.9–10.3)
Chloride: 107 mmol/L (ref 98–111)
Creatinine, Ser: 0.94 mg/dL (ref 0.61–1.24)
GFR, Estimated: >60 mL/min (ref >60)
Glucose, Bld: 108 mg/dL — ABNORMAL HIGH (ref 70–99)
Potassium: 3.6 mmol/L (ref 3.5–5.1)
Sodium: 141 mmol/L (ref 135–145)

## 2022-04-15 MED ORDER — AMOXICILLIN-POT CLAVULANATE 875-125 MG PO TABS
1.0000 | ORAL_TABLET | Freq: Two times a day (BID) | ORAL | Status: DC
  Administered 2022-04-15 – 2022-04-17 (×5): 1 via ORAL
  Filled 2022-04-15 (×5): qty 1

## 2022-04-15 MED ORDER — ADULT MULTIVITAMIN W/MINERALS CH
1.0000 | ORAL_TABLET | Freq: Every day | ORAL | Status: DC
  Administered 2022-04-15 – 2022-04-17 (×3): 1 via ORAL
  Filled 2022-04-15 (×3): qty 1

## 2022-04-15 NOTE — Progress Notes (Signed)
[name]                                                                               Jason Jones, is a 71 y.o. male, DOB - 1951-01-23, QJF:[id number] Admit date - 04/12/2022    Outpatient Primary MD for the patient is Jason Holms, NP  LOS - 3  days    Brief summary   Jason Jones is a 71 y.o. male with medical history significant of recent CVA, chronic alcohol use, hypertension. GERD, asthma, was recently admitted to  Internal Medicine teaching service  after a right PCA stroke and was discharged to SNF on 10/191/2023. He presents from SNF for generalized weakness, supra pubic pain and hematuria started today.   CT ad and pelvis done showing There is mild hydronephrosis in both kidneys. Both ureters are dilated. There are no opaque renal or ureteral stones. Prominence of collecting systems in both kidneys may suggest partial obstruction at the ureterovesical junctions.   Urology consulted and recommendations given.   Assessment & Plan    Assessment and Plan:  Gross hematuria; Possibly from catheter related trauma.  Urine still pink Slowly clearing up.      Hydronephrosis and AKI;  - post obstructive Uropathy.  - hydrate and repeat renal parameters show improvement and creatinine back to baseline.  - continue with IV fluids.  - repeat US shows improvement in the hydronephrosis.    Dysphagia:  SLP requested and recommendations given.    Hypertension;  Well controlled.    Asthma, no wheezing heard.    GERD stable.    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  Was discharged on aspirin, plavix and statin. Restarted them.  Will need outpatient follow up with Neurologist, and ziopatch for atrial fibrillation.    Estimated body mass index is 19.19 kg/m as calculated from the following:   Height as of this encounter: '5\' 11"'$  (1.803 m).   Weight as of this encounter: 62.4 kg.  Code Status: full code.  DVT  Prophylaxis:  enoxaparin (LOVENOX) injection 40 mg Start: 04/13/22 2200   Level of Care: Level of care: Med-Surg Family Communication: None at bedside.   Disposition Plan:     Remains inpatient appropriate:  HEMATURIA.   Procedures:  CT abd and pelvis.  US RENAL   Consultants:   Urology.   Antimicrobials:   Anti-infectives (From admission, onward)    Start     Dose/Rate Route Frequency Ordered Stop   04/15/22 1115  amoxicillin-clavulanate (AUGMENTIN) 875-125 MG per tablet 1 tablet        1 tablet Oral Every 12 hours 04/15/22 1024     04/12/22 1600  cefTRIAXone (ROCEPHIN) 1 g in sodium chloride 0.9 % 100 mL IVPB        1 g 200 mL/hr over 30 Minutes Intravenous  Once 04/12/22 1553 04/12/22 1634        Medications  Scheduled Meds:  amLODipine  5 mg Oral Daily   amoxicillin-clavulanate  1 tablet Oral Q12H   aspirin EC  81 mg Oral Daily   Chlorhexidine Gluconate Cloth  6 each Topical Daily   clopidogrel  75  mg Oral Daily   doxazosin  1 mg Oral Daily   enoxaparin (LOVENOX) injection  40 mg Subcutaneous Q24H    morphine injection  4 mg Intravenous Once   multivitamin with minerals  1 tablet Oral Daily   ondansetron (ZOFRAN) IV  4 mg Intravenous Once   polyethylene glycol  17 g Oral Daily   rosuvastatin  20 mg Oral Daily   senna-docusate  2 tablet Oral BID   Continuous Infusions:  sodium chloride 100 mL/hr at 04/15/22 0914   PRN Meds:.acetaminophen, albuterol, food thickener, sodium chloride    Subjective:   Jason Jones was seen and examined today.  No chest pain or sob. Abdominal discomfort resolved.   Objective:   Vitals:   04/15/22 0531 04/15/22 0942 04/15/22 0948 04/15/22 1312  BP: 131/84 128/84 128/84 125/69  Pulse: 63   83  Resp: 15   20  Temp: 99.3 F (37.4 C)   98.1 F (36.7 C)  TempSrc: Oral   Oral  SpO2: 97%   94%  Weight:      Height:        Intake/Output Summary (Last 24 hours) at 04/15/2022 1501 Last data filed at 04/15/2022 1000 Gross  per 24 hour  Intake 3039.13 ml  Output 2150 ml  Net 889.13 ml    Filed Weights   04/12/22 2021  Weight: 62.4 kg     Exam General exam: Appears calm and comfortable  Respiratory system: Clear to auscultation. Respiratory effort normal. Cardiovascular system: S1 & S2 heard, RRR. No JVD,  No pedal edema. Gastrointestinal system: Abdomen is nondistended, soft and nontender. . Normal bowel sounds heard. Central nervous system: Alert and oriented. Left hemiparesis.  Extremities: Symmetric 5 x 5 power. Skin: No rashes, Psychiatry:  Mood & affect appropriate.      Data Reviewed:  I have personally reviewed following labs and imaging studies   CBC Lab Results  Component Value Date   WBC 5.3 04/15/2022   RBC 3.73 (L) 04/15/2022   HGB 11.0 (L) 04/15/2022   HCT 34.5 (L) 04/15/2022   MCV 92.5 04/15/2022   MCH 29.5 04/15/2022   PLT 347 04/15/2022   MCHC 31.9 04/15/2022   RDW 12.9 04/15/2022   LYMPHSABS 0.9 04/15/2022   MONOABS 0.5 04/15/2022   EOSABS 0.5 04/15/2022   BASOSABS 0.0 97/67/3419     Last metabolic panel Lab Results  Component Value Date   NA 141 04/15/2022   K 3.6 04/15/2022   CL 107 04/15/2022   CO2 27 04/15/2022   BUN 13 04/15/2022   CREATININE 0.94 04/15/2022   GLUCOSE 108 (H) 04/15/2022   GFRNONAA >60 04/15/2022   GFRAA >60 04/21/2017   CALCIUM 8.9 04/15/2022   PHOS 3.4 03/25/2022   PROT 6.8 04/14/2022   ALBUMIN 2.9 (L) 04/14/2022   BILITOT 0.5 04/14/2022   ALKPHOS 43 04/14/2022   AST 26 04/14/2022   ALT 27 04/14/2022   ANIONGAP 7 04/15/2022    CBG (last 3)  No results for input(s): "GLUCAP" in the last 72 hours.    Coagulation Profile: No results for input(s): "INR", "PROTIME" in the last 168 hours.   Radiology Studies: US RENAL  Result Date: 04/14/2022 CLINICAL DATA:  Hydronephrosis. EXAM: RENAL / URINARY TRACT ULTRASOUND COMPLETE COMPARISON:  Abdominopelvic CT 04/12/2022, 2 days prior FINDINGS: Right Kidney: Renal measurements:  10.1 x 4.8 x 4.9 cm = volume: 125 mL. Hydronephrosis on prior CT has resolved. There is increased renal parenchymal echogenicity. No evidence of focal lesion  or stone. Left Kidney: Renal measurements: 9.4 x 5.4 x 5.6 cm = volume: 149 mL. Hydronephrosis on prior CT has resolved. There is increased renal parenchymal echogenicity. No evidence of focal renal lesion or stone. Bladder: Circumferential bladder wall thickening with Foley catheter in place. Mass effect from enlarged prostate. Other: Enlarged prostate gland spans 5.4 x 4.2 x 6.5 cm, volume of 77 cc. IMPRESSION: 1. Bilateral hydronephrosis on prior CT has resolved. 2. Increased renal parenchymal echogenicity typical of chronic medical renal disease. 3. Diffuse bladder wall thickening. This may be related to chronic bladder outlet obstruction in the setting of enlarged prostate. Electronically Signed   By: Keith Rake M.D.   On: 04/14/2022 20:58       Hosie Poisson M.D. [name] 04/15/2022, 3:01 PM  Available via Epic secure chat 7am-7pm After 7 pm, please refer to night coverage provider listed on amion.

## 2022-04-15 NOTE — Progress Notes (Signed)
Dr Karleen Hampshire, patient's daughter would like you to call her in the AM for an update.

## 2022-04-15 NOTE — Progress Notes (Signed)
Initial Nutrition Assessment  DOCUMENTATION CODES:   Non-severe (moderate) malnutrition in context of chronic illness  INTERVENTION:   -Mightyshake-nectar TID with every meal, provides 220 kcals, 6g protein  -Multivitamin with minerals daily  NUTRITION DIAGNOSIS:   Moderate Malnutrition related to dysphagia, chronic illness as evidenced by mild fat depletion, mild muscle depletion.  GOAL:   Patient will meet greater than or equal to 90% of their needs  MONITOR:   Supplement acceptance, PO intake, Weight trends, I & O's, Labs  REASON FOR ASSESSMENT:   Malnutrition Screening Tool    ASSESSMENT:   71 y.o. male with medical history significant of recent CVA, chronic alcohol use, hypertension. GERD, asthma, was recently admitted to  Internal Medicine teaching service  after a right PCA stroke and was discharged to SNF on 10/191/2023. He presents from SNF for generalized weakness, supra pubic pain and hematuria started today.  Patient in room, reports he ate well this morning. States his pureed diet is "working". Pt likes chocolate pudding and states he ate everything on his plate but the milk.  Pt states he doesn't use any protein supplements at home and takes Vitamin D. Pt is okay with receiving Mightyshakes on every tray, not just lunch and dinner.  Pt's dysphagia is r/t his recent CVA in October. Has left hemiparesis as well.  Per pt, UBW is ~142 lbs. Current weight: 137 lbs.  Medications: Miralax, Senokot  Labs reviewed.  NUTRITION - FOCUSED PHYSICAL EXAM:  Flowsheet Row Most Recent Value  Orbital Region Mild depletion  Upper Arm Region Mild depletion  Thoracic and Lumbar Region Unable to assess  Buccal Region No depletion  Temple Region Moderate depletion  Clavicle Bone Region No depletion  Clavicle and Acromion Bone Region No depletion  Scapular Bone Region No depletion  Dorsal Hand Mild depletion  Patellar Region No depletion  [left sided hemiparesis r/t  CVA]  Anterior Thigh Region No depletion  Posterior Calf Region No depletion  Edema (RD Assessment) None  Hair Reviewed  Eyes Reviewed  [reports vision problems]  Mouth Reviewed  [no teeth]  Skin Reviewed       Diet Order:   Diet Order             DIET - DYS 1 Room service appropriate? Yes; Fluid consistency: Nectar Thick  Diet effective now                   EDUCATION NEEDS:   No education needs have been identified at this time  Skin:  Skin Assessment: Reviewed RN Assessment  Last BM:  11/6 -type 6  Height:   Ht Readings from Last 1 Encounters:  04/12/22 '5\' 11"'$  (1.803 m)    Weight:   Wt Readings from Last 1 Encounters:  04/12/22 62.4 kg    BMI:  Body mass index is 19.19 kg/m.  Estimated Nutritional Needs:   Kcal:  1800-2000  Protein:  90-100g  Fluid:  2L/day  Clayton Bibles, MS, RD, LDN Inpatient Clinical Dietitian Contact information available via Amion

## 2022-04-15 NOTE — Progress Notes (Signed)
Chaplain engaged in an initial visit with Jason Jones to answer consult concerning Healthcare POA.  Jason Jones has his daughter and brother who he would like to assign as his healthcare agents.  Both are his only next-of-kin per [facility].  Jason Jones wants Chaplain to discuss that with his daughter and brother.  Chaplain will give them a call.   Jason Jones and Chaplain discussed their spiritual care communities/churches.  Jason Jones is a member of [employer], but also shared his history of in being apart of [employer]  Chaplain offered to get in touch with Jason Jones's clergy if needed.  He had no needs at this time.   Chaplain offered support, listening, and presence.    04/15/22 1400  Clinical Encounter Type  Visited With Patient  Visit Type Social support;Initial

## 2022-04-15 NOTE — Progress Notes (Signed)
Speech Language Pathology Treatment: Dysphagia  Patient Details Name: Jason Jones MRN: [id number] DOB: 1950-10-01 Today's Date: 04/15/2022 Time: 8828-0034 SLP Time Calculation (min) (ACUTE ONLY): 15 min  Assessment / Plan / Recommendation Clinical Impression  Patient seen by SLP for skilled treatment focused on dysphagia goals. RN entered room briefly during session and she did report that patient has had some coughing with meals. Patient was in bed when SLP arrived but he was awake, alert and receptive to having some nectar thick juice. He was able to hold cup and give self sips via straw sips and no overt s/s aspiration or penetration observed during or after PO intake. He continues to be impulsive but today he seemed less distracted and did independently set cup down in between sips. He asked SLP questions regarding reason for catheter, "Who put it in?", endorsing some pain at catheter site. He did not have any questions or concerns about puree solids or nectar thick liquids. SLP is recommending to continue on current diet consistency and will determine if a repeat MBS could be done while he is here in hospital.   HPI HPI: Patient is a 71 y.o. male with PMH: recent CVA with left sided paresis, dysphagia, dysarthria, chronic alcohol use, HTN, GERD, asthma. He has been at a SNF since CVA. He presented to the hospital on 04/12/22 from his SNF with generalized weakness, supra pubic pain and hematuria. He reports being constipated and that he had not had a BM for 5 days. In ED, he was afebrile, tachycardic and normotensive. He was diagnosed with gross hematuria likely related to traumatic catheter manipulation and friable post-radiation prostatic tissue.      SLP Plan  Continue with current plan of care      Recommendations for follow up therapy are one component of a multi-disciplinary discharge planning process, led by the attending physician.  Recommendations may be updated based on patient  status, additional functional criteria and insurance authorization.    Recommendations  Diet recommendations: Dysphagia 1 (puree);Nectar-thick liquid Liquids provided via: Straw;Cup Medication Administration: Whole meds with puree Supervision: Staff to assist with self feeding;Full supervision/cueing for compensatory strategies Compensations: Minimize environmental distractions;Slow rate;Small sips/bites Postural Changes and/or Swallow Maneuvers: Seated upright 90 degrees                Oral Care Recommendations: Oral care BID Follow Up Recommendations: Skilled nursing-short term rehab (<3 hours/day) Assistance recommended at discharge: Frequent or constant Supervision/Assistance SLP Visit Diagnosis: Dysphagia, unspecified (R13.10) Plan: Continue with current plan of care          Sonia Baller, MA, CCC-SLP Speech Therapy

## 2022-04-15 NOTE — Care Management Important Message (Signed)
Important Message  Patient Details IM Letter placed in Patient's room. Name: Jason Jones MRN: [id number] Date of Birth: 04/22/1951   Medicare Important Message Given:  Yes     Kerin Salen 04/15/2022, 11:11 AM

## 2022-04-16 ENCOUNTER — Inpatient Hospital Stay (HOSPITAL_COMMUNITY): Payer: Medicare Other

## 2022-04-16 DIAGNOSIS — E440 Moderate protein-calorie malnutrition: Secondary | ICD-10-CM

## 2022-04-16 MED ORDER — AMOXICILLIN-POT CLAVULANATE 875-125 MG PO TABS: 1.0000 | ORAL_TABLET | Freq: Two times a day (BID) | ORAL | 0 refills | Status: AC

## 2022-04-16 MED ORDER — POLYETHYLENE GLYCOL 3350 17 G PO PACK: 17.0000 g | PACK | Freq: Every day | ORAL | 0 refills | Status: AC | PRN

## 2022-04-16 MED ORDER — ADULT MULTIVITAMIN W/MINERALS CH: 1.0000 | ORAL_TABLET | Freq: Every day | ORAL | 0 refills | Status: DC

## 2022-04-16 MED ORDER — SALINE SPRAY 0.65 % NA SOLN: 1.0000 | NASAL | 0 refills | Status: AC | PRN

## 2022-04-16 MED ORDER — MELATONIN 3 MG PO TABS
3.0000 mg | ORAL_TABLET | Freq: Once | ORAL | Status: DC
  Filled 2022-04-16: qty 1

## 2022-04-16 NOTE — Progress Notes (Signed)
Modified Barium Swallow Progress Note  Patient Details  Name: Jason Jones MRN: [id number] Date of Birth: 05-Sep-1950  Today's Date: 04/16/2022  Modified Barium Swallow completed.  Full report located under Chart Review in the Imaging Section.  Brief recommendations include the following:  Clinical Impression  Pt presents with mild oral dysphagia mostly characterized by impaired oral control due to prior CVA resulting in premature spillage of boluses into pharynx and minimal oral retention.  Improvement in swallow function noted since prior MBS.  No aspiration noted and no reflexive coughing observed during MBS.  Pt also was sitting FULLY upright and bolus amounts were controlled.  Pharyngeal swallow is strong without retention, despite pt sensing retention in pharynx with pudding and solid.         Trace penetration of thin due to spillage to pyriform sinus prior to swallow trigger -  noted that was inconsistent and aspiration was not observed.    Upon esophageal sweep, pt appeared with mild barium retention - without sensation. Barium tablet given with nectar *per pt wishes*  appeared to halt at distal esophagus - clearing with pudding bolus. Suspect pt has component of esophageal dysmotility - and he admits to issues with reflux- radiologist not present to confirm and MBS does not diagnosis in distal esophagus. Pt does endorse h/o reflux.     Recommend advance diet to Dys3/thin with strict precautions. Prefer use of straw as it facilitates improved swallow efficiency/oral transiting.  Medications with nectar advised - Whole.    SLP instructed pt to stay upright for at least 30 minutes after meals as he prefers to lay down after - Strict follow up with SNF SLP recommended. Advised pt to focus on improving strength of cough and voice to maximize pulmonary clearance using teach back.   Swallow Evaluation Recommendations       SLP Diet Recommendations: Dysphagia 3 (Mech soft) solids;Thin  liquid   Liquid Administration via: Straw   Medication Administration: Whole meds with liquid (with NECTAR)   Supervision: Full supervision/cueing for compensatory strategies;Staff to assist with self feeding   Compensations: Minimize environmental distractions;Slow rate;Small sips/bites       Oral Care Recommendations: Oral care BID;Staff/trained caregiver to provide oral care   Other Recommendations: Clarify dietary restrictions  Jason Lime, MS [place] Office [phone] Pager [phone]   Jason Jones 04/16/2022,1:09 PM

## 2022-04-16 NOTE — Evaluation (Signed)
  MBS completed, Preliminary results as follows.   Pt presents with mild oral dysphagia mostly characterized by impaired oral control due to prior CVA resulting in premature spillage of boluses into pharynx and minimal oral retention.  No aspiration noted and no reflexive coughing observed during MBS.  Pt also was sitting FULLY upright and bolus amounts were controlled.  Pharyngeal swallow is strong without retention, despite pt sensing retention in pharynx with pudding and solid.    Trace penetration of thin due to spillage to pyriform sinus prior to swallow trigger -  noted that was inconsistent.  Upon esophageal sweep, pt appeared with mild barium retention - without sensation. Barium tablet given with nectar *per pt wishes*  appeared to halt at distal esophagus - clearing with pudding bolus. Suspect pt has component of esophageal dysmotility - and he admits to issues with reflux.    Recommend advance diet to Dys3/thin with strict precautions. Prefer use of straw as it facilitates improved swallow function.  Medications with nectar advised - Whole.     SLP instructed pt to stay upright for at least 30 minutes after meals as he prefers to lay down after - Strict follow up with SNF SLP recommended.    Thanks so much!  Kathleen Lime, MS [facility] SLP Acute Rehab Services Office [phone] Pager [phone]

## 2022-04-16 NOTE — NC FL2 (Deleted)
[place] LEVEL OF CARE SCREENING TOOL     IDENTIFICATION  Patient Name: Jason Jones Birthdate: 1951-04-13 Sex: male Admission Date (Current Location): 04/12/2022  [facility] and [place] Number:  Herbalist and Address:  [facility],  [place], [place]      Provider Number: [id number]  Attending Physician Name and Address:  Hosie Poisson, MD  Relative Name and Phone Number:  Dajion Bickford [phone]    Current Level of Care: Hospital Recommended Level of Care: [place] Prior Approval Number:    Date Approved/Denied:   PASRR Number: [id number]  Discharge Plan: SNF    Current Diagnoses: Patient Active Problem List   Diagnosis Date Noted   Malnutrition of moderate degree 04/15/2022   AKI (acute kidney injury) ([place]) 04/12/2022   Acute ischemic right PCA stroke ([place]) 03/25/2022   Hypertension 03/25/2022   Dysphagia due to recent cerebrovascular accident (CVA) 03/25/2022   Elevated liver enzymes 03/25/2022   Rhabdomyolysis 03/25/2022   Malignant neoplasm of prostate ([place]) 02/20/2021    Orientation RESPIRATION BLADDER Height & Weight     Self, Time, Situation, Place  Normal Incontinent (at times) Weight: 62.4 kg Height:  '5\' 11"'$  (180.3 cm)  BEHAVIORAL SYMPTOMS/MOOD NEUROLOGICAL BOWEL NUTRITION STATUS  Other (Comment) (forgetful)   Incontinent Diet (see d/c summary)  AMBULATORY STATUS COMMUNICATION OF NEEDS Skin   Extensive Assist Verbally Skin abrasions (ankle, hip , shoulder)                       Personal Care Assistance Level of Assistance  Bathing, Feeding, Dressing Bathing Assistance: Maximum assistance Feeding assistance: Limited assistance Dressing Assistance: Maximum assistance     Functional Limitations Info  Speech, Hearing, Sight Sight Info: Adequate Hearing Info: Adequate Speech Info: Impaired (dysarthia)    SPECIAL CARE FACTORS FREQUENCY  PT (By licensed PT), OT (By  licensed OT)     PT Frequency: 5X/wk OT Frequency: 5X/wk            Contractures Contractures Info: Not present    Additional Factors Info    Code Status Info: Full Allergies Info: NKA           Current Medications (04/16/2022):  This is the current hospital active medication list Current Facility-Administered Medications  Medication Dose Route Frequency Provider Last Rate Last Admin   0.9 %  sodium chloride infusion   Intravenous Continuous Hosie Poisson, MD 100 mL/hr at 04/15/22 1923 New Bag at 04/15/22 1923   acetaminophen (TYLENOL) tablet 650 mg  650 mg Oral Q4H PRN Hosie Poisson, MD       albuterol (PROVENTIL) (2.5 MG/3ML) 0.083% nebulizer solution 2.5 mg  2.5 mg Nebulization Q4H PRN Hosie Poisson, MD       amLODipine (NORVASC) tablet 5 mg  5 mg Oral Daily Hosie Poisson, MD   5 mg at 04/16/22 1022   amoxicillin-clavulanate (AUGMENTIN) 875-125 MG per tablet 1 tablet  1 tablet Oral Q12H Hosie Poisson, MD   1 tablet at 04/16/22 1022   aspirin EC tablet 81 mg  81 mg Oral Daily Hosie Poisson, MD   81 mg at 04/16/22 1022   Chlorhexidine Gluconate Cloth 2 % PADS 6 each  6 each Topical Daily Hosie Poisson, MD   6 each at 04/16/22 1023   clopidogrel (PLAVIX) tablet 75 mg  75 mg Oral Daily Hosie Poisson, MD   75 mg at 04/16/22 1022   doxazosin (CARDURA) tablet 1 mg  1 mg Oral Daily Hosie Poisson, MD   1 mg at 04/16/22 1022   enoxaparin (LOVENOX) injection 40 mg  40 mg Subcutaneous Q24H Tawnya Crook, RPH   40 mg at 04/15/22 2253   food thickener (SIMPLYTHICK (NECTAR/LEVEL 2/MILDLY THICK)) 1 packet  1 packet Oral PRN Hosie Poisson, MD       morphine (PF) 4 MG/ML injection 4 mg  4 mg Intravenous Once Hosie Poisson, MD       multivitamin with minerals tablet 1 tablet  1 tablet Oral Daily Hosie Poisson, MD   1 tablet at 04/16/22 1022   ondansetron (ZOFRAN) injection 4 mg  4 mg Intravenous Once Hosie Poisson, MD       polyethylene glycol (MIRALAX / GLYCOLAX) packet 17 g  17 g Oral  Daily Hosie Poisson, MD   17 g at 04/16/22 1021   rosuvastatin (CRESTOR) tablet 20 mg  20 mg Oral Daily Hosie Poisson, MD   20 mg at 04/16/22 1022   senna-docusate (Senokot-S) tablet 2 tablet  2 tablet Oral BID Hosie Poisson, MD   2 tablet at 04/16/22 1021   sodium chloride (OCEAN) 0.65 % nasal spray 1 spray  1 spray Each Nare PRN Raenette Rover, NP         Discharge Medications: Please see discharge summary for a list of discharge medications.  Relevant Imaging Results:  Relevant Lab Results:   Additional Information SS# [id number]  Angelita Ingles, RN

## 2022-04-16 NOTE — Discharge Summary (Addendum)
Physician Discharge Summary   Patient: Jason Jones MRN: [id number] DOB: 1950-08-10  Admit date:     04/12/2022  Discharge date: 04/16/22  Discharge Physician: Hosie Poisson   PCP: Arthur Holms, NP   Recommendations at discharge:  Please follow up with PCP in one week.  Please follow up with Urology as recommended.  Please check cbc and bmp in one week.   Discharge Diagnoses: Active Problems:   AKI (acute kidney injury) ([place])   Malnutrition of moderate degree    Hospital Course:  Jason Jones is a 71 y.o. male with medical history significant of recent CVA, chronic alcohol use, hypertension. GERD, asthma, was recently admitted to  Internal Medicine teaching service  after a right PCA stroke and was discharged to SNF on 10/191/2023. He presents from SNF for generalized weakness, supra pubic pain and hematuria started today.    CT ad and pelvis done showing There is mild hydronephrosis in both kidneys. Both ureters are dilated. There are no opaque renal or ureteral stones. Prominence of collecting systems in both kidneys may suggest partial obstruction at the ureterovesical junctions.    Urology consulted and recommendations given.  Assessment and Plan:   Gross hematuria; Possibly from catheter related trauma.  Urine still pink Slowly clearing up. hemoglobin improving.          Hydronephrosis and AKI;  - post obstructive Uropathy.  - hydrate and repeat renal parameters show improvement and creatinine back to baseline.  - continue with IV fluids.  - repeat US shows improvement in the hydronephrosis.      Dysphagia:  SLP requested and recommendations given.   dys3/thin - meds with nectar -    Hypertension;  Well controlled.      Asthma, no wheezing heard.      GERD stable.      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  Was discharged on aspirin, plavix and statin. Restarted them.  Will need outpatient follow up with Neurologist, and  ziopatch for atrial fibrillation.      Estimated body mass index is 19.19 kg/m as calculated from the following:   Height as of this encounter: '5\' 11"'$  (1.803 m).   Weight as of this encounter: 62.4 kg.        Consultants: urology.  Procedures performed: none.   Disposition: Skilled nursing facility Diet recommendation:  dys3/thin - meds with nectar -  Discharge Diet Orders (From admission, onward)     Start     Ordered   04/16/22 0000  Diet - low sodium heart healthy        04/16/22 0916            DISCHARGE MEDICATION: Allergies as of 04/16/2022       Reactions   No Known Allergies         Medication List     TAKE these medications    albuterol (2.5 MG/3ML) 0.083% nebulizer solution Commonly known as: PROVENTIL Take 3 mLs (2.5 mg total) by nebulization every 4 (four) hours as needed for wheezing or shortness of breath.   amLODipine 5 MG tablet Commonly known as: NORVASC Take 1 tablet (5 mg total) by mouth daily.   amoxicillin-clavulanate 875-125 MG tablet Commonly known as: AUGMENTIN Take 1 tablet by mouth every 12 (twelve) hours for 5 days.   aspirin 81 MG chewable tablet Chew 1 tablet (81 mg total) by mouth daily.   bisacodyl 10 MG suppository Commonly known as: DULCOLAX Place 10 mg  rectally once.   clopidogrel 75 MG tablet Commonly known as: PLAVIX Take 1 tablet (75 mg total) by mouth daily. Take 1 tablet by mouth until 06/25/22 to complete a 3 month course. What changed: additional instructions   doxazosin 1 MG tablet Commonly known as: CARDURA Take 1 tablet (1 mg total) by mouth daily. For BPH   feeding supplement Liqd Take 237 mLs by mouth 2 (two) times daily between meals. What changed: when to take this   food thickener Gel Commonly known as: SIMPLYTHICK (NECTAR/LEVEL 2/MILDLY THICK) Take 1 packet by mouth as needed.   multivitamin with minerals Tabs tablet Take 1 tablet by mouth daily.   nicotine 21 mg/24hr patch Commonly known  as: NICODERM CQ - dosed in mg/24 hours Place 1 patch (21 mg total) onto the skin daily.   polyethylene glycol 17 g packet Commonly known as: MIRALAX / GLYCOLAX Take 17 g by mouth daily as needed.   rosuvastatin 20 MG tablet Commonly known as: CRESTOR Take 1 tablet (20 mg total) by mouth daily.   sennosides-docusate sodium 8.6-50 MG tablet Commonly known as: SENOKOT-S Take 1 tablet by mouth at bedtime.   sodium chloride 0.65 % Soln nasal spray Commonly known as: OCEAN Place 1 spray into both nostrils as needed for congestion.        Follow-up Information     Karen Kays, NP Follow up on 04/17/2022.   Specialty: Nurse Practitioner Why: 8:15am Contact information: [address] [place] [address] [phone]         Arthur Holms, NP. Schedule an appointment as soon as possible for a visit in 1 week(s).   Specialty: Nurse Practitioner Contact information: [place] [address] [phone]                Discharge Exam: Danley Danker Weights   04/12/22 2021  Weight: 62.4 kg   General exam: Appears calm and comfortable  Respiratory system: Clear to auscultation. Respiratory effort normal. Cardiovascular system: S1 & S2 heard, RRR. No JVD, No pedal edema. Gastrointestinal system: Abdomen is nondistended, soft and nontender.. Central nervous system: Alert and oriented. No focal neurological deficits. Extremities: Symmetric 5 x 5 power. Skin: No rashes, lesions or ulcers Psychiatry:Mood & affect appropriate.    Condition at discharge: fair  The results of significant diagnostics from this hospitalization (including imaging, microbiology, ancillary and laboratory) are listed below for reference.   Imaging Studies: US RENAL  Result Date: 04/14/2022 CLINICAL DATA:  Hydronephrosis. EXAM: RENAL / URINARY TRACT ULTRASOUND COMPLETE COMPARISON:  Abdominopelvic CT 04/12/2022, 2 days prior FINDINGS: Right Kidney: Renal measurements:  10.1 x 4.8 x 4.9 cm = volume: 125 mL. Hydronephrosis on prior CT has resolved. There is increased renal parenchymal echogenicity. No evidence of focal lesion or stone. Left Kidney: Renal measurements: 9.4 x 5.4 x 5.6 cm = volume: 149 mL. Hydronephrosis on prior CT has resolved. There is increased renal parenchymal echogenicity. No evidence of focal renal lesion or stone. Bladder: Circumferential bladder wall thickening with Foley catheter in place. Mass effect from enlarged prostate. Other: Enlarged prostate gland spans 5.4 x 4.2 x 6.5 cm, volume of 77 cc. IMPRESSION: 1. Bilateral hydronephrosis on prior CT has resolved. 2. Increased renal parenchymal echogenicity typical of chronic medical renal disease. 3. Diffuse bladder wall thickening. This may be related to chronic bladder outlet obstruction in the setting of enlarged prostate. Electronically Signed   By: Keith Rake M.D.   On: 04/14/2022 20:58  CT Abdomen Pelvis W Contrast  Result Date: 04/12/2022 CLINICAL DATA:  Abdominal pain, hematuria, history of prostate carcinoma EXAM: CT ABDOMEN AND PELVIS WITH CONTRAST TECHNIQUE: Multidetector CT imaging of the abdomen and pelvis was performed using the standard protocol following bolus administration of intravenous contrast. RADIATION DOSE REDUCTION: This exam was performed according to the departmental dose-optimization program which includes automated exposure control, adjustment of the mA and/or kV according to patient size and/or use of iterative reconstruction technique. CONTRAST:  185m OMNIPAQUE IOHEXOL 300 MG/ML  SOLN COMPARISON:  Abdomen radiograph done on 03/24/2022 FINDINGS: Lower chest: There are linear patchy densities in left lower lung field. Hepatobiliary: No focal abnormalities are seen in liver. There is no dilation of bile ducts. Motion artifacts limit evaluation of gallbladder. Pancreas: No focal abnormalities are seen. Spleen: Unremarkable. Adrenals/Urinary Tract: Adrenals are  unremarkable. There is mild hydronephrosis in both kidneys. There are no demonstrable renal or ureteral stones. There is marked diffuse wall thickening in urinary bladder. Foley catheter is seen in the bladder. There is increased density in the dependent portion of urinary bladder lumen, possibly blood clot. Prostate is enlarged projecting into the base of the bladder. Small diverticulum is noted in the right lateral margin of the urinary bladder. Stomach/Bowel: Stomach is not distended. Small bowel loops are not dilated. Appendix is unremarkable. There is no significant wall thickening in colon. Scattered diverticula are seen. There are no signs of focal acute diverticulitis. Vascular/Lymphatic: Scattered arterial calcifications are seen. Reproductive: Prostate is enlarged. There are surgical clips in prostate. Other: There is no ascites or pneumoperitoneum. Large umbilical hernia containing fat is seen. Musculoskeletal: Degenerative changes are noted in lumbar spine, more so at L4-L5 level. IMPRESSION: There is no evidence of intestinal obstruction or pneumoperitoneum. There is mild hydronephrosis in both kidneys. Both ureters are dilated. There are no opaque renal or ureteral stones. Prominence of collecting systems in both kidneys may suggest partial obstruction at the ureterovesical junctions. There is marked diffuse wall thickening in urinary bladder which may be due to chronic bladder outlet obstruction or cystitis. High density in the dependent portion of the bladder lumen may suggest blood products. Foley catheter is seen in the bladder. Prostate is enlarged projecting into the base of the urinary bladder. Linear patchy infiltrate in left lower lung fields may suggest atelectasis/pneumonia. Diverticulosis of colon without signs of focal diverticulitis. Other findings as described in the body of the report. Electronically Signed   By: PElmer PickerM.D.   On: 04/12/2022 15:42   DG Swallowing  Func-Speech Pathology  Result Date: 03/25/2022 Table formatting from the original result was not included. Objective Swallowing Evaluation: Type of Study: Bedside Swallow Evaluation  Patient Details Name: CArvle GrabeMRN: [id number] of Birth: [birth year]Today's Date: 03/25/2022 Time: SLP Start Time (ACUTE ONLY): 145-SLP Stop Time (ACUTE ONLY): 13818SLP Time Calculation (min) (ACUTE ONLY): 20 min Past Medical History: Past Medical History: Diagnosis Date  Arthritis   Asthma   [place]   Chronic alcohol abuse   Dyspnea   GERD (gastroesophageal reflux disease)   PRN  ---  TAKES BAKING SODA IN WATER  History of pneumothorax   04-28-2006  fell, left fx rib--  resolved w/ chest tube  Hypertension   Nocturia   Poor dental hygiene   Right inguinal hernia   Weak urinary stream  Past Surgical History: Past Surgical History: Procedure Laterality Date  COLONOSCOPY  01/08/2021  GOLD SEED IMPLANT N/A 05/23/2021  Procedure: GOLD SEED IMPLANT;  Surgeon: Ceasar Mons, MD;  Location: [facility];  Service: Urology;  Laterality: N/A;  INGUINAL HERNIA REPAIR Right 04/23/2017  Procedure: OPEN RIGHT INGUINAL HERNIA REPAIR WITH MESH;  Surgeon: Kinsinger, Arta Bruce, MD;  Location: [facility];  Service: General;  Laterality: Right;  GENERAL COMBINED WITH REGIONAL FOR POST OP PAIN   INSERTION OF MESH Right 04/23/2017  Procedure: INSERTION OF MESH;  Surgeon: Kinsinger, Arta Bruce, MD;  Location: [facility];  Service: General;  Laterality: Right;  GENERAL COMBINED WITH REGIONAL FOR POST OP PAIN   LIPOMA EXCISION Right 02/22/2015  Procedure: RIGHT SHOULDER MASS EXCISION 15 CM SQ;  Surgeon: Mickeal Skinner, MD;  Location[facility];  Service: General;  Laterality: Right;  SPACE OAR INSTILLATION N/A 05/23/2021  Procedure: SPACE OAR INSTILLATION;  Surgeon: Ceasar Mons, MD;  Location: [facility];  Service: Urology;  Laterality: N/A;  TRANSRECTAL ULTRASOUND N/A  05/23/2021  Procedure: TRANSRECTAL ULTRASOUND;  Surgeon: Ceasar Mons, MD;  Location: [facility];  Service: Urology;  Laterality: N/A; HPI: Pt is a 71 yo male wtih acute onset L sided weakness and fall 10/13, found down  and brought in by EMS 10/15. MRI revealed large R PCA CVA. PMH includes: GERD, alcohol use, prostate ca (2022) s/p external beam radiation (completed Feb 2023), prior Hepatitis C, HTN, tobacco use disorder  Subjective: asking for ice chips  Recommendations for follow up therapy are one component of a multi-disciplinary discharge planning process, led by the attending physician.  Recommendations may be updated based on patient status, additional functional criteria and insurance authorization. Assessment / Plan / Recommendation   03/25/2022   3:05 PM Clinical Impressions Clinical Impression Pt presents with oropharyngeal dysphagia characterized by a pharyngeal delay and reduction in labial seal, poterior propulsion, tongue base retraction, and bolus cohesion. Pt exhibited vallecular residue, difficulty with A-P transport (regular texture solids and of the barium tablet), and liquid boluses were often triggered with the head of the bolus at the level of the pyriform sinuses. Pt demonstrated penetration (PAS 3) with thin liquids via cup when boluses were administered by the SLP, but this progressed to PAS 5 and intermittently to aspiration (PAS 7) when pt self-fed. A single instance of aspiration (PAS 7) was noted with a large bolus of nectar thick liquids, due to spillover of prematurely spilled liquid in the pyriform sinuses, but this could not be replicated. Laryngeal invasion was eliminated with use of smaller bolus sizes combined with a chin tuck posture. However, often due to impulsivity, pt consistently demonstrated difficulty implementing one or both of these. A dysphagia 3 diet with nectar thick liquids will be initiated at this time. However, SLP is hopeful that the pt will be able  to advanced to thin liquids this week with training for use of compensatory strategies. SLP will continue to follow pt. SLP Visit Diagnosis Dysphagia, oropharyngeal phase (R13.12) Impact on safety and function Mild aspiration risk     03/25/2022   3:05 PM Treatment Recommendations Treatment Recommendations Therapy as outlined in treatment plan below     03/25/2022   3:05 PM Prognosis Prognosis for Safe Diet Advancement Good Barriers to Reach Goals Cognitive deficits   03/25/2022   3:05 PM Diet Recommendations SLP Diet Recommendations Dysphagia 3 (Mech soft) solids;Nectar thick liquid Liquid Administration via Cup;Straw Medication Administration Whole meds with liquid Compensations Minimize environmental distractions;Slow rate;Small sips/bites Postural Changes Seated upright at 90 degrees     03/25/2022   3:05 PM  Other Recommendations Oral Care Recommendations Oral care BID;Staff/trained caregiver to provide oral care Other Recommendations Order thickener from pharmacy Follow Up Recommendations -- Assistance recommended at discharge Frequent or constant Supervision/Assistance Functional Status Assessment Patient has had a recent decline in their functional status and demonstrates the ability to make significant improvements in function in a reasonable and predictable amount of time.   03/25/2022   3:05 PM Frequency and Duration  Speech Therapy Frequency (ACUTE ONLY) min 2x/week Treatment Duration 2 weeks     03/25/2022   3:05 PM Oral Phase Oral Phase Impaired Oral - Nectar Cup Weak lingual manipulation;Reduced posterior propulsion;Decreased bolus cohesion;Premature spillage Oral - Nectar Straw Weak lingual manipulation;Reduced posterior propulsion;Decreased bolus cohesion;Premature spillage Oral - Thin Cup Weak lingual manipulation;Reduced posterior propulsion;Decreased bolus cohesion;Premature spillage Oral - Thin Straw Weak lingual manipulation;Reduced posterior propulsion;Decreased bolus cohesion;Premature  spillage Oral - Puree Weak lingual manipulation;Reduced posterior propulsion;Decreased bolus cohesion;Premature spillage Oral - Regular Weak lingual manipulation;Reduced posterior propulsion;Decreased bolus cohesion;Premature spillage;Impaired mastication Oral - Pill Weak lingual manipulation;Reduced posterior propulsion;Decreased bolus cohesion;Premature spillage    03/25/2022   3:05 PM Pharyngeal Phase Pharyngeal Phase Impaired Pharyngeal- Nectar Cup Delayed swallow initiation-pyriform sinuses;Pharyngeal residue - valleculae;Reduced tongue base retraction Pharyngeal- Nectar Straw Delayed swallow initiation-pyriform sinuses;Pharyngeal residue - valleculae;Reduced tongue base retraction;Penetration/Aspiration during swallow Pharyngeal Material enters airway, remains ABOVE vocal cords and not ejected out Pharyngeal- Thin Cup Delayed swallow initiation-pyriform sinuses;Pharyngeal residue - valleculae;Reduced tongue base retraction;Penetration/Aspiration during swallow Pharyngeal Material enters airway, remains ABOVE vocal cords and not ejected out;Material enters airway, CONTACTS cords and not ejected out;Material enters airway, passes BELOW cords and not ejected out despite cough attempt by patient Pharyngeal- Thin Straw Delayed swallow initiation-pyriform sinuses;Reduced tongue base retraction;Penetration/Aspiration during swallow Pharyngeal Material enters airway, remains ABOVE vocal cords and not ejected out;Material enters airway, CONTACTS cords and not ejected out;Material enters airway, passes BELOW cords and not ejected out despite cough attempt by patient Pharyngeal- Puree Delayed swallow initiation-pyriform sinuses;Pharyngeal residue - valleculae;Reduced tongue base retraction Pharyngeal- Regular Delayed swallow initiation-pyriform sinuses;Pharyngeal residue - valleculae;Reduced tongue base retraction Pharyngeal- Pill Delayed swallow initiation-pyriform sinuses;Reduced tongue base retraction    03/25/2022    3:05 PM Cervical Esophageal Phase  Cervical Esophageal Phase [facility] Shanika I. Hardin Negus, [place], [place] Office number [phone] Horton Marshall 03/25/2022, 3:19 PM                     ECHOCARDIOGRAM COMPLETE  Result Date: 03/25/2022    ECHOCARDIOGRAM REPORT   Patient Name:   FABRICE DYAL Date of Exam: 03/25/2022 Medical Rec #:  [id number]      Height:       71.0 in Accession #:    [id number]     Weight:       143.6 lb Date of Birth:  [birth year]      BSA:          1.832 m Patient Age:    50 years       BP:           159/96 mmHg Patient Gender: M              HR:           42 bpm. Exam Location:  Inpatient Procedure: 2D Echo, Color Doppler and Cardiac Doppler Indications:    stroke  History:        Patient has no prior history of Echocardiogram examinations.  Sonographer:    Melissa Morford RDCS (AE, PE) Referring Phys:  [place] Left ventricular ejection fraction, by estimation, is 60 to 65%. The left ventricle has normal function. The left ventricle has no regional wall motion abnormalities. Left ventricular diastolic parameters were normal.  2. Right ventricular systolic function is normal. The right ventricular size is normal.  3. The mitral valve is normal in structure. Trivial mitral valve regurgitation. No evidence of mitral stenosis.  4. The aortic valve is tricuspid. Aortic valve regurgitation is not visualized. Aortic valve sclerosis/calcification is present, without any evidence of aortic stenosis. Comparison(s): No prior Echocardiogram. Conclusion(s)/Recommendation(s): No intracardiac source of embolism detected on this transthoracic study. Consider a transesophageal echocardiogram to exclude cardiac source of embolism if clinically indicated. FINDINGS  Left Ventricle: Left ventricular ejection fraction, by estimation, is 60 to 65%. The left ventricle has normal function. The left ventricle has no regional wall motion abnormalities. The left  ventricular internal cavity size was normal in size. There is  no left ventricular hypertrophy. Left ventricular diastolic parameters were normal. Right Ventricle: The right ventricular size is normal. No increase in right ventricular wall thickness. Right ventricular systolic function is normal. Left Atrium: Left atrial size was normal in size. Right Atrium: Right atrial size was normal in size. Pericardium: There is no evidence of pericardial effusion. Mitral Valve: The mitral valve is normal in structure. Trivial mitral valve regurgitation. No evidence of mitral valve stenosis. Tricuspid Valve: The tricuspid valve is normal in structure. Tricuspid valve regurgitation is trivial. Aortic Valve: The aortic valve is tricuspid. Aortic valve regurgitation is not visualized. Aortic valve sclerosis/calcification is present, without any evidence of aortic stenosis. Pulmonic Valve: The pulmonic valve was not well visualized. Pulmonic valve regurgitation is trivial. Aorta: The aortic root and ascending aorta are structurally normal, with no evidence of dilitation. Venous: The inferior vena cava was not well visualized. IAS/Shunts: The atrial septum is grossly normal.  LEFT VENTRICLE PLAX 2D LVIDd:         4.50 cm   Diastology LVIDs:         3.10 cm   LV e' medial:    11.20 cm/s LV PW:         0.80 cm   LV E/e' medial:  9.4 LV IVS:        0.90 cm   LV e' lateral:   12.90 cm/s LVOT diam:     2.30 cm   LV E/e' lateral: 8.1 LV SV:         86 LV SV Index:   47 LVOT Area:     4.15 cm  RIGHT VENTRICLE RV S prime:     14.80 cm/s TAPSE (M-mode): 2.4 cm LEFT ATRIUM           Index        RIGHT ATRIUM           Index LA diam:      3.00 cm 1.64 cm/m   RA Area:     13.10 cm LA Vol (A4C): 36.9 ml 20.15 ml/m  RA Volume:   32.70 ml  17.85 ml/m  AORTIC VALVE LVOT Vmax:   71.10 cm/s LVOT Vmean:  51.400 cm/s LVOT VTI:    0.208 m  AORTA Ao Root diam: 3.40 cm Ao Asc diam:  3.10 cm MITRAL VALVE                TRICUSPID VALVE MV Area (PHT):  5.66 cm     TR Peak grad:   30.9 mmHg MV Decel  Time: 134 msec     TR Vmax:        278.00 cm/s MV E velocity: 105.00 cm/s MV A velocity: 68.60 cm/s   SHUNTS MV E/A ratio:  1.53         Systemic VTI:  0.21 m                             Systemic Diam: 2.30 cm Gwyndolyn Kaufman MD Electronically signed by Gwyndolyn Kaufman MD Signature Date/Time: 03/25/2022/11:33:15 AM    Final    MR BRAIN WO CONTRAST  Result Date: 03/25/2022 CLINICAL DATA:  Acute neurologic deficit EXAM: MRI HEAD WITHOUT CONTRAST MRA HEAD WITHOUT CONTRAST MRA NECK WITHOUT AND WITH CONTRAST TECHNIQUE: Multiplanar, multi-echo pulse sequences of the brain and surrounding structures were acquired without intravenous contrast. Angiographic images of the Circle of Willis were acquired using MRA technique without intravenous contrast. Angiographic images of the neck were acquired using MRA technique without and with intravenous contrast. Carotid stenosis measurements (when applicable) are obtained utilizing NASCET criteria, using the distal internal carotid diameter as the denominator. CONTRAST:  6.9m GADAVIST GADOBUTROL 1 MMOL/ML IV SOLN COMPARISON:  None Available. 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 No acute or chronic hemorrhage. There is multifocal hyperintense T2-weighted signal within the white matter. Generalized volume loss. The midline structures are normal. Vascular: Major flow voids are preserved. Skull and upper cervical spine: Normal calvarium and skull base. Visualized upper cervical spine and soft tissues are normal. Sinuses/Orbits:No paranasal sinus fluid levels or advanced mucosal thickening. No mastoid or middle ear effusion. Normal orbits. MRA HEAD FINDINGS POSTERIOR CIRCULATION: --Vertebral arteries: Normal --Inferior cerebellar arteries: Normal. --Basilar artery: Normal. --Superior cerebellar  arteries: Normal. --Posterior cerebral arteries: Right P1 segment is occluded. Left PCA is normal. ANTERIOR CIRCULATION: --Intracranial internal carotid arteries: Normal. --Anterior cerebral arteries (ACA): Normal. Absent left A1 segment, normal variant --Middle cerebral arteries (MCA): Normal. ANATOMIC VARIANTS: None MRA NECK FINDINGS Examination is degraded by motion and poor contrast. Aortic arch: Unremarkable Right carotid system: Antegrade flow without significant stenosis. Left carotid system: Antegrade flow with probable 50% stenosis of the proximal ICA Vertebral arteries: Codominant.  Patent with antegrade flow. Other: None IMPRESSION: 1. 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 No hemorrhage or mass effect. 2. Occlusion of the right PCA P1 segment. 3. 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 Electronically Signed   By: KUlyses JarredM.D.   On: 03/25/2022 00:15   MR ANGIO NECK W WO CONTRAST  Result Date: 03/25/2022 CLINICAL DATA:  Acute neurologic deficit EXAM: MRI HEAD WITHOUT CONTRAST MRA HEAD WITHOUT CONTRAST MRA NECK WITHOUT AND WITH CONTRAST TECHNIQUE: Multiplanar, multi-echo pulse sequences of the brain and surrounding structures were acquired without intravenous contrast. Angiographic images of the Circle of Willis were acquired using MRA technique without intravenous contrast. Angiographic images of the neck were acquired using MRA technique without and with intravenous contrast. Carotid stenosis measurements (when applicable) are obtained utilizing NASCET criteria, using the distal internal carotid diameter as the denominator. CONTRAST:  6.549mGADAVIST GADOBUTROL 1 MMOL/ML IV SOLN COMPARISON:  None Available. 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 No acute  or chronic  hemorrhage. There is multifocal hyperintense T2-weighted signal within the white matter. Generalized volume loss. The midline structures are normal. Vascular: Major flow voids are preserved. Skull and upper cervical spine: Normal calvarium and skull base. Visualized upper cervical spine and soft tissues are normal. Sinuses/Orbits:No paranasal sinus fluid levels or advanced mucosal thickening. No mastoid or middle ear effusion. Normal orbits. MRA HEAD FINDINGS POSTERIOR CIRCULATION: --Vertebral arteries: Normal --Inferior cerebellar arteries: Normal. --Basilar artery: Normal. --Superior cerebellar arteries: Normal. --Posterior cerebral arteries: Right P1 segment is occluded. Left PCA is normal. ANTERIOR CIRCULATION: --Intracranial internal carotid arteries: Normal. --Anterior cerebral arteries (ACA): Normal. Absent left A1 segment, normal variant --Middle cerebral arteries (MCA): Normal. ANATOMIC VARIANTS: None MRA NECK FINDINGS Examination is degraded by motion and poor contrast. Aortic arch: Unremarkable Right carotid system: Antegrade flow without significant stenosis. Left carotid system: Antegrade flow with probable 50% stenosis of the proximal ICA Vertebral arteries: Codominant.  Patent with antegrade flow. Other: None IMPRESSION: 1. 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 No hemorrhage or mass effect. 2. Occlusion of the right PCA P1 segment. 3. 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 Electronically Signed   By: Ulyses Jarred M.D.   On: 03/25/2022 00:15   MR ANGIO HEAD WO CONTRAST  Result Date: 03/25/2022 CLINICAL DATA:  Acute neurologic deficit EXAM: MRI HEAD WITHOUT CONTRAST MRA HEAD WITHOUT CONTRAST MRA NECK WITHOUT AND WITH CONTRAST TECHNIQUE: Multiplanar, multi-echo pulse sequences of the brain and surrounding structures were acquired without intravenous contrast. Angiographic images of the Circle of  Willis were acquired using MRA technique without intravenous contrast. Angiographic images of the neck were acquired using MRA technique without and with intravenous contrast. Carotid stenosis measurements (when applicable) are obtained utilizing NASCET criteria, using the distal internal carotid diameter as the denominator. CONTRAST:  6.70m GADAVIST GADOBUTROL 1 MMOL/ML IV SOLN COMPARISON:  None Available. 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 No acute or chronic hemorrhage. There is multifocal hyperintense T2-weighted signal within the white matter. Generalized volume loss. The midline structures are normal. Vascular: Major flow voids are preserved. Skull and upper cervical spine: Normal calvarium and skull base. Visualized upper cervical spine and soft tissues are normal. Sinuses/Orbits:No paranasal sinus fluid levels or advanced mucosal thickening. No mastoid or middle ear effusion. Normal orbits. MRA HEAD FINDINGS POSTERIOR CIRCULATION: --Vertebral arteries: Normal --Inferior cerebellar arteries: Normal. --Basilar artery: Normal. --Superior cerebellar arteries: Normal. --Posterior cerebral arteries: Right P1 segment is occluded. Left PCA is normal. ANTERIOR CIRCULATION: --Intracranial internal carotid arteries: Normal. --Anterior cerebral arteries (ACA): Normal. Absent left A1 segment, normal variant --Middle cerebral arteries (MCA): Normal. ANATOMIC VARIANTS: None MRA NECK FINDINGS Examination is degraded by motion and poor contrast. Aortic arch: Unremarkable Right carotid system: Antegrade flow without significant stenosis. Left carotid system: Antegrade flow with probable 50% stenosis of the proximal ICA Vertebral arteries: Codominant.  Patent with antegrade flow. Other: None IMPRESSION: 1. 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 No hemorrhage or mass effect. 2. Occlusion of the right PCA P1 segment. 3. 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 Electronically Signed   By: KUlyses JarredM.D.   On: 03/25/2022 00:15   DG Chest 1 View  Result Date: 03/24/2022 CLINICAL  DATA:  [id number] Encounter for imaging to screen for metal prior to magnetic resonance imaging (MRI) [id number] EXAM: CHEST  1 VIEW COMPARISON:  None Available. FINDINGS: Heart and mediastinal contours are within normal limits. No focal opacities or effusions. No acute bony abnormality. No radiopaque foreign bodies. Old healed left rib fractures. IMPRESSION: No active cardiopulmonary disease. Electronically Signed   By: Rolm Baptise M.D.   On: 03/24/2022 20:10   DG Abd 1 View  Result Date: 03/24/2022 CLINICAL DATA:  [id number] Encounter for imaging to screen for metal prior to magnetic resonance imaging (MRI) [id number] EXAM: ABDOMEN - 1 VIEW COMPARISON:  None Available. FINDINGS: Small clips project over the lower pelvis. No additional radiopaque foreign bodies. No organomegaly or free air. Nonobstructive bowel gas pattern. IMPRESSION: Small surgical clips project over the lower pelvis which are likely safe for MRI imaging. Electronically Signed   By: Rolm Baptise M.D.   On: 03/24/2022 20:10   CT HEAD WO CONTRAST  Result Date: 03/24/2022 CLINICAL DATA:  Found down, stroke 2 days ago EXAM: CT HEAD WITHOUT CONTRAST TECHNIQUE: Contiguous axial images were obtained from the base of the skull through the vertex without intravenous contrast. RADIATION DOSE REDUCTION: This exam was performed according to the departmental dose-optimization program which includes automated exposure control, adjustment of the mA and/or kV according to patient size and/or use of iterative reconstruction technique. COMPARISON:  01/18/2012 FINDINGS: Brain: No evidence of hemorrhage, hydrocephalus, extra-axial collection or mass lesion/mass effect. Hypodensity in  the medial right occipital lobe, reflecting a subacute right PCA distribution infarct (series 3/image 14), concordant with the clinical history. Vascular: Intracranial atherosclerosis. Skull: Normal. Negative for fracture or focal lesion. Sinuses/Orbits: The visualized paranasal sinuses are essentially clear. The mastoid air cells are unopacified. Other: None. IMPRESSION: Subacute right PCA distribution infarct, concordant with the clinical history. No evidence of hemorrhagic transformation. Electronically Signed   By: Julian Hy M.D.   On: 03/24/2022 17:54    Microbiology: Results for orders placed or performed during the hospital encounter of 04/12/22  Urine Culture     Status: Abnormal   Collection Time: 04/12/22  1:46 PM   Specimen: Urine, Catheterized  Result Value Ref Range Status   Specimen Description   Final    URINE, CATHETERIZED Performed at [facility] [address], [place], [place] [address]    Special Requests   Final    NONE Performed at [facility], [place] [address] [place], [place] [address]    Culture (A)  Final    >=100,000 COLONIES/mL DIPHTHEROIDS(CORYNEBACTERIUM SPECIES) Standardized susceptibility testing for this organism is not available. Performed at [facility], [place] [address] [place], [place] [address]    Report Status 04/14/2022 FINAL  Final    Labs: CBC: Recent Labs  Lab 04/12/22 1346 04/13/22 1854 04/14/22 0553 04/15/22 1036  WBC 11.5*  --  6.5 5.3  NEUTROABS 10.5*  --  4.1 3.4  HGB 13.5 10.3* 10.9* 11.0*  HCT 41.1 31.9* 33.3* 34.5*  MCV 90.1  --  91.2 92.5  PLT 410*  --  348 892   Basic Metabolic Panel: Recent Labs  Lab 04/12/22 1346 04/13/22 0837 04/14/22 0553 04/15/22 1036  NA 138 138 139 141  K 4.8 3.8 3.6 3.6  CL 103 107 108 107  CO2 '23 24 26 27  '$ GLUCOSE 147* 128* 106* 108*  BUN 36* 25* 20 13  CREATININE 2.05* 1.25* 0.88 0.94  CALCIUM 9.5 9.2 8.8* 8.9  Liver Function  Tests: Recent Labs  Lab 04/12/22 1346 04/14/22 0553  AST 28 26  ALT 31 27  ALKPHOS 59 43  BILITOT 0.6 0.5  PROT 8.6* 6.8  ALBUMIN 3.6 2.9*   CBG: No results for input(s): "GLUCAP" in the last 168 hours.  Discharge time spent: 38 minutes.   Signed: Hosie Poisson, MD [facility] 04/16/2022

## 2022-04-16 NOTE — TOC Progression Note (Addendum)
Transition of Care [facility]) - Progression Note    Patient Details  Name: Jason Jones MRN: [id number] Date of Birth: December 21, 1950  Transition of Care [facility]) CM/SW [place], RN Phone Number:[phone]  04/16/2022, 11:58 AM  Clinical Narrative:    [facility] consulted for patient with discharge orders/ patient is from [facility] for short term rehab. CM spoke with patient and daughter Jason Jones. Both are in agreement to return to [facility]. Cm spoke with Juliann Pulse at [facility] who confirms that patient can return. FL2 completed. Insurance auth initiated with [employer] now pending. Candace Cruise ID# [id number] CM will continue to follow.   [place] status remains pending        Expected Discharge Plan and Services           Expected Discharge Date: 04/16/22                                     Social Determinants of Health (SDOH) Interventions    Readmission Risk Interventions     No data to display

## 2022-04-16 NOTE — NC FL2 (Signed)
[place] LEVEL OF CARE SCREENING TOOL     IDENTIFICATION  Patient Name: Jason Jones Birthdate: 1951-05-02 Sex: male Admission Date (Current Location): 04/12/2022  [facility] and [place] Number:  Herbalist and Address:  [facility],  [place], [place]      Provider Number: [id number]  Attending Physician Name and Address:  Hosie Poisson, MD  Relative Name and Phone Number:  Bonham Zingale [phone]    Current Level of Care: Hospital Recommended Level of Care: [place] Prior Approval Number:    Date Approved/Denied:   PASRR Number: [id number]  Discharge Plan: SNF    Current Diagnoses: Patient Active Problem List   Diagnosis Date Noted   Malnutrition of moderate degree 04/15/2022   AKI (acute kidney injury) ([place]) 04/12/2022   Acute ischemic right PCA stroke ([place]) 03/25/2022   Hypertension 03/25/2022   Dysphagia due to recent cerebrovascular accident (CVA) 03/25/2022   Elevated liver enzymes 03/25/2022   Rhabdomyolysis 03/25/2022   Malignant neoplasm of prostate ([place]) 02/20/2021    Orientation RESPIRATION BLADDER Height & Weight     Self, Time, Situation, Place  Normal Incontinent (at times) Weight: 62.4 kg Height:  '5\' 11"'$  (180.3 cm)  BEHAVIORAL SYMPTOMS/MOOD NEUROLOGICAL BOWEL NUTRITION STATUS  Other (Comment) (forgetful)   Incontinent Diet (see d/c summary)  AMBULATORY STATUS COMMUNICATION OF NEEDS Skin   Extensive Assist Verbally Skin abrasions (ankle, hip , shoulder)                       Personal Care Assistance Level of Assistance  Bathing, Feeding, Dressing Bathing Assistance: Maximum assistance Feeding assistance: Limited assistance Dressing Assistance: Maximum assistance     Functional Limitations Info  Speech, Hearing, Sight Sight Info: Adequate Hearing Info: Adequate Speech Info: Impaired (dysarthia)    SPECIAL CARE FACTORS FREQUENCY  PT (By licensed PT), OT (By  licensed OT)     PT Frequency: 5X/wk OT Frequency: 5X/wk            Contractures Contractures Info: Not present    Additional Factors Info    Code Status Info: Full Allergies Info: NKA           Current Medications (04/16/2022):  This is the current hospital active medication list Current Facility-Administered Medications  Medication Dose Route Frequency Provider Last Rate Last Admin   0.9 %  sodium chloride infusion   Intravenous Continuous Hosie Poisson, MD 100 mL/hr at 04/15/22 1923 New Bag at 04/15/22 1923   acetaminophen (TYLENOL) tablet 650 mg  650 mg Oral Q4H PRN Hosie Poisson, MD       albuterol (PROVENTIL) (2.5 MG/3ML) 0.083% nebulizer solution 2.5 mg  2.5 mg Nebulization Q4H PRN Hosie Poisson, MD       amLODipine (NORVASC) tablet 5 mg  5 mg Oral Daily Hosie Poisson, MD   5 mg at 04/16/22 1022   amoxicillin-clavulanate (AUGMENTIN) 875-125 MG per tablet 1 tablet  1 tablet Oral Q12H Hosie Poisson, MD   1 tablet at 04/16/22 1022   aspirin EC tablet 81 mg  81 mg Oral Daily Hosie Poisson, MD   81 mg at 04/16/22 1022   Chlorhexidine Gluconate Cloth 2 % PADS 6 each  6 each Topical Daily Hosie Poisson, MD   6 each at 04/16/22 1023   clopidogrel (PLAVIX) tablet 75 mg  75 mg Oral Daily Hosie Poisson, MD   75 mg at 04/16/22 1022   doxazosin (CARDURA) tablet 1 mg  1 mg Oral Daily Hosie Poisson, MD   1 mg at 04/16/22 1022   enoxaparin (LOVENOX) injection 40 mg  40 mg Subcutaneous Q24H Tawnya Crook, RPH   40 mg at 04/15/22 2253   food thickener (SIMPLYTHICK (NECTAR/LEVEL 2/MILDLY THICK)) 1 packet  1 packet Oral PRN Hosie Poisson, MD       morphine (PF) 4 MG/ML injection 4 mg  4 mg Intravenous Once Hosie Poisson, MD       multivitamin with minerals tablet 1 tablet  1 tablet Oral Daily Hosie Poisson, MD   1 tablet at 04/16/22 1022   ondansetron (ZOFRAN) injection 4 mg  4 mg Intravenous Once Hosie Poisson, MD       polyethylene glycol (MIRALAX / GLYCOLAX) packet 17 g  17 g Oral  Daily Hosie Poisson, MD   17 g at 04/16/22 1021   rosuvastatin (CRESTOR) tablet 20 mg  20 mg Oral Daily Hosie Poisson, MD   20 mg at 04/16/22 1022   senna-docusate (Senokot-S) tablet 2 tablet  2 tablet Oral BID Hosie Poisson, MD   2 tablet at 04/16/22 1021   sodium chloride (OCEAN) 0.65 % nasal spray 1 spray  1 spray Each Nare PRN Raenette Rover, NP         Discharge Medications: Please see discharge summary for a list of discharge medications.  Relevant Imaging Results:  Relevant Lab Results:   Additional Information SS# [id number]  Angelita Ingles, RN

## 2022-04-16 NOTE — Progress Notes (Signed)
Speech Language Pathology Treatment: Dysphagia  Patient Details Name: Jason Jones MRN: [id number] DOB: [birth year] Today's Date: 04/16/2022 Time: 7517-0017 SLP Time Calculation (min) (ACUTE ONLY): 14 min  Assessment / Plan / Recommendation Clinical Impression  Pt seen to assess indication/readiness for instrumental swallow evaluation to determine appropriateness for dietary advancement since his prior MBS in Oct 2023 after his acute right PCA CVA.   Pt alert, mildly dysarthric with weak volitional cough.  He admits he does not enjoy current diet restriction - eats mashed potatoes 3 times a day.  SLP observed him consuming gingerale via tsp, cup - small bolus and cup and large sequential cup boluses.  Cough post-swallow noted after single cup bolus.  Large sequential boluses were followed by eructation. Pt admits to acid reflux - but denies taking a reflux medication.    Pt endorses more difficulties with solids than liquids causing cough. At this time, recommend to repeat MBS to assure pt is on an appropriate diet - since his recent CVA.  He consumed pork chops etc prior to cVA without deficits. Orders obtained and MBS planned for noon today.  Thanks so much.    HPI HPI: Patient is a 71 y.o. male with PMH: recent CVA with left sided paresis, dysphagia, dysarthria, chronic alcohol use, HTN, GERD, asthma. He has been at a SNF since CVA. He presented to the hospital on 04/12/22 from his SNF with generalized weakness, supra pubic pain and hematuria. He reports being constipated and that he had not had a BM for 5 days. In ED, he was afebrile, tachycardic and normotensive. He was diagnosed with gross hematuria likely related to traumatic catheter manipulation and friable post-radiation prostatic tissue.  Pt with aspiration on mbs in october after his cva - small amount of aspiration that did not clear with cued cough.  Chin tuck with thin advised - pt is impulsive. He reports incr cough with food more than  liquids.      SLP Plan  MBS (today)      Recommendations for follow up therapy are one component of a multi-disciplinary discharge planning process, led by the attending physician.  Recommendations may be updated based on patient status, additional functional criteria and insurance authorization.    Recommendations  Liquids provided via: Straw;Cup Medication Administration: Whole meds with puree Supervision: Staff to assist with self feeding;Full supervision/cueing for compensatory strategies Compensations: Minimize environmental distractions;Slow rate;Small sips/bites Postural Changes and/or Swallow Maneuvers: Seated upright 90 degrees                Oral Care Recommendations: Oral care BID Follow Up Recommendations: Skilled nursing-short term rehab (<3 hours/day) Assistance recommended at discharge: Frequent or constant Supervision/Assistance SLP Visit Diagnosis: Dysphagia, oropharyngeal phase (R13.12) Plan: MBS (today)           Jason Jones, [place] SLP [place] Office [phone] Pager [phone]  04/16/2022, 11:30 AM

## 2022-04-17 ENCOUNTER — Inpatient Hospital Stay (HOSPITAL_COMMUNITY): Payer: Medicare Other

## 2022-04-17 DIAGNOSIS — R338 Other retention of urine: Secondary | ICD-10-CM

## 2022-04-17 DIAGNOSIS — N400 Enlarged prostate without lower urinary tract symptoms: Secondary | ICD-10-CM | POA: Insufficient documentation

## 2022-04-17 DIAGNOSIS — N138 Other obstructive and reflux uropathy: Secondary | ICD-10-CM | POA: Insufficient documentation

## 2022-04-17 DIAGNOSIS — N401 Enlarged prostate with lower urinary tract symptoms: Secondary | ICD-10-CM

## 2022-04-17 DIAGNOSIS — Z8673 Personal history of transient ischemic attack (TIA), and cerebral infarction without residual deficits: Secondary | ICD-10-CM

## 2022-04-17 DIAGNOSIS — I10 Essential (primary) hypertension: Secondary | ICD-10-CM

## 2022-04-17 LAB — HEMOGLOBIN AND HEMATOCRIT, BLOOD
HCT: 34.7 % — ABNORMAL LOW (ref 39.0–52.0)
Hemoglobin: 11.4 g/dL — ABNORMAL LOW (ref 13.0–17.0)

## 2022-04-17 NOTE — Progress Notes (Signed)
Physical Therapy Treatment Patient Details Name: Jason Jones MRN: [id number] DOB: 24-Aug-1950 Today's Date: 04/17/2022   History of Present Illness Pt is a 71 y.o. male admitted for AKI and hematuria. Past medical history significant of chronic alcohol use, prostate CA (2022) s/p external beam radiation (completed feb 2023), prior hepatitis C, HTN, tobacco use disorder and was recently admitted to [facility] after a right PCA stroke and was discharged to SNF on 03/28/2022. He presented from SNF for generalized weakness, supra pubic pain and hematuria on 04/12/22.    PT Comments    General Comments: Pt fell OOB last night.  Today, Pt awake but likes to keep his eyes closed.  Pt able to follow repeat functional commands.  Pt able to share some of his history.  [employer] 10 years.  Following commands.  Difficult to understand speech at times.  Able to state he needed to have a BM. All mobility was difficult.  General bed mobility comments: pt required Total Asisst (pt 10%) to transition from supine to EOB.  Pt present with POOR sitting balance.  Collapsed posture.  Severe LEFT/posterior lean with inability to self coorect to midline.  L UE in wrisr splint.  Required MAX Asisst to prevent total LOB.  HIGH FALL RISK. General transfer comment: Required + 2 Max/Toatl Assist to perfrom SPS from bed to recliner with Therapist blocking knees from buckling and supporting upper body.  Pt was unable to support his weight and presebt with total LEFT/anterior lean with no self correction.  Transfered again from recliner to [facility] due to statement "need to have a poop".  Performed "Bear Hug" SPS from recliner to [facility] 1/4 pivot towards his RIGHT.  Uncontrolled stand to sit.  Once seated on BSC, required Mod Assist to prevent anterior LOB as pt was unable to achieve uprght posture.  "slumped over" and head downward.  Pt did have a small BM.  + 2 asisst to rise, perfrom hygiene then assisted to recliner an other "Bear Hug".  + 2 top scoot  to back of recliner then multiple pillows to position to comfort, position to center midline. Pt will need ST Rehab at SNF to address mobility and functional decline prior to safely returning home.   Recommendations for follow up therapy are one component of a multi-disciplinary discharge planning process, led by the attending physician.  Recommendations may be updated based on patient status, additional functional criteria and insurance authorization.  Follow Up Recommendations  Skilled nursing-short term rehab (<3 hours/day) Can patient physically be transported by private vehicle: No   Assistance Recommended at Discharge Frequent or constant Supervision/Assistance  Patient can return home with the following Two people to help with walking and/or transfers;Two people to help with bathing/dressing/bathroom;Assistance with cooking/housework;Assist for transportation;Help with stairs or ramp for entrance   Equipment Recommendations       Recommendations for Other Services       Precautions / Restrictions Precautions Precautions: Fall Precaution Comments: Dense L HEMI Required Braces or Orthoses: Splint/Cast Splint/Cast: left forearm splint for positioning Restrictions Weight Bearing Restrictions: No LLE Weight Bearing: Weight bearing as tolerated Other Position/Activity Restrictions: L LE 1/5 strength     Mobility  Bed Mobility Overal bed mobility: Needs Assistance Bed Mobility: Supine to Sit     Supine to sit: Total assist, +2 for physical assistance     General bed mobility comments: pt required Total Asisst (pt 10%) to transition from supine to EOB.  Pt present with POOR sitting balance.  Collapsed posture.  Severe LEFT/posterior lean with inability to self coorect to midline.  L UE in wrisr splint.  Required MAX Asisst to prevent total LOB.  HIGH FALL RISK.    Transfers Overall transfer level: Needs assistance Equipment used: 2 person hand held assist Transfers: Bed to  chair/wheelchair/BSC   Stand pivot transfers: Max assist, +2 physical assistance, +2 safety/equipment, Total assist         General transfer comment: Required + 2 Max/Toatl Assist to perfrom SPS from bed to recliner with Therapist blocking knees from buckling and supporting upper body.  Pt was unable to support his weight and presebt with total LEFT/anterior lean with no self correction.  Transfered again from recliner to [facility] due to statement "need to have a poop".  Performed "Bear Hug" SPS from recliner to [facility] 1/4 pivot towards his RIGHT.  Uncontrolled stand to sit.  Once seated on BSC, required Mod Assist to prevent anterior LOB as pt was unable to achieve uprght posture.  "slumped over" and head downward.  Pt did have a small BM.  + 2 asisst to rise, perfrom hygiene then assisted to recliner an other "Bear Hug".  + 2 top scoot to back of recliner then multiple pillows to position to comfort, position to center midline.    Ambulation/Gait               General Gait Details: Unable to progress to gait training at this time.   Stairs             Wheelchair Mobility    Modified Rankin (Stroke Patients Only)       Balance                                            Cognition Arousal/Alertness: Awake/alert Behavior During Therapy: Impulsive Overall Cognitive Status: No family/caregiver present to determine baseline cognitive functioning Area of Impairment: Awareness, Problem solving, Orientation, Memory, Following commands, Attention, Safety/judgement                 Orientation Level: Disoriented to, Time Current Attention Level: Sustained Memory: Decreased recall of precautions, Decreased short-term memory Following Commands: Follows one step commands consistently, Follows one step commands with increased time, Follows multi-step commands inconsistently Safety/Judgement: Decreased awareness of safety, Decreased awareness of deficits   Problem  Solving: Slow processing, Difficulty sequencing, Requires verbal cues, Requires tactile cues, Decreased initiation General Comments: Pt able tyo share some of his history.  [employer] 10 years.  Following commands.  Difficult to understand speech at times.  Able to state he needed to have a BM.        Exercises      General Comments        Pertinent Vitals/Pain Pain Assessment Pain Assessment: No/denies pain    Home Living                          Prior Function            PT Goals (current goals can now be found in the care plan section) Progress towards PT goals: Progressing toward goals    Frequency    Min 2X/week      PT Plan Discharge plan needs to be updated;Frequency needs to be updated    Co-evaluation              AM-PAC  PT "6 Clicks" Mobility   Outcome Measure  Help needed turning from your back to your side while in a flat bed without using bedrails?: Total Help needed moving from lying on your back to sitting on the side of a flat bed without using bedrails?: Total Help needed moving to and from a bed to a chair (including a wheelchair)?: Total Help needed standing up from a chair using your arms (e.g., wheelchair or bedside chair)?: Total Help needed to walk in hospital room?: Total Help needed climbing 3-5 steps with a railing? : Total 6 Click Score: 6    End of Session Equipment Utilized During Treatment: Gait belt Activity Tolerance: Patient limited by fatigue Patient left: in chair;with chair alarm set;with call bell/phone within reach Nurse Communication: Mobility status PT Visit Diagnosis: Other abnormalities of gait and mobility (R26.89);Hemiplegia and hemiparesis Hemiplegia - Right/Left: Left Hemiplegia - caused by: Cerebral infarction     Time: 8786-7672 PT Time Calculation (min) (ACUTE ONLY): 26 min  Charges:  $Therapeutic Activity: 23-37 mins                     Rica Koyanagi  PTA Acute  Rehabilitation  Services Office M-F          [phone] Weekend pager [phone]

## 2022-04-17 NOTE — Progress Notes (Addendum)
   04/17/22 0555  Vitals  Temp 98.4 F (36.9 C)  Temp Source Oral  BP (!) 144/91  MAP (mmHg) 107  BP Location Right Leg  BP Method Automatic  Patient Position (if appropriate) Lying  Pulse Rate 85  Pulse Rate Source Monitor  Resp 18  Level of Consciousness  Level of Consciousness Alert  MEWS COLOR  MEWS Score Color Green  Oxygen Therapy  SpO2 97 %  O2 Device Room Air  Patient Activity (if Appropriate) In bed  Pain Assessment  Pain Scale Faces  Pain Score 0  Pain Type Acute pain  Pain Location Arm  Pain Orientation Left  Pain Descriptors / Indicators Aching  Pain Frequency Constant  Pain Onset Gradual  Patients Stated Pain Goal 4  Pain Intervention(s) Emotional support;Environmental changes  Multiple Pain Sites No  Complaints & Interventions  Neuro symptoms relieved by Rest  Constipation interventions Stool Softener;Laxative  Glasgow Coma Scale  Eye Opening 4  Best Verbal Response (NON-intubated) 5  Best Motor Response 6  Glasgow Coma Scale Score 15  MEWS Score  MEWS Temp 0  MEWS Systolic 0  MEWS Pulse 0  MEWS RR 0  MEWS LOC 0  MEWS Score 0  Provider Notification  Provider Name/Title Gershon Cull, NP  Date Provider Notified 04/17/22  Time Provider Notified [phone]  Method of Notification Page (secure chat)  Notification Reason Other (Comment) (fall)  Provider response At bedside;See new orders  Date of Provider Response 04/17/22  Time of Provider Response [phone]   Patient observed on floor by nurse tech and RN at [phone]. Patient placed back in bed, vital signs taken, family [name]) -daughter called and notified. Patient complained of pain in left shoulder and arm and is not new, but has "wakened up from old pain" according to patient. No complaints of hitting head. Floor mat placed on floor. Prior to fall bed alarm, non skid socks in place and patient close to nurses' station.

## 2022-04-17 NOTE — Progress Notes (Signed)
Patient ID: Jason Jones, male   DOB: 24-Jan-1951, 71 y.o.   MRN: [id number] Report called to [facility] and given to [place]. No further questions at this time.   Haydee Salter, RN

## 2022-04-17 NOTE — Hospital Course (Addendum)
Jason Jones is a 71 y.o. male with a history of recent CVA, prostate cancer, chronic alcohol use, hypertension, GERD, asthma who presented secondary to AKI secondary to obstructive uropathy. Urology consulted and foley catheter placed patient developed hematuria with stable hemoglobin complicated by aspirin and Plavix use.

## 2022-04-17 NOTE — Progress Notes (Signed)
Patient ID: Jason Jones, male   DOB: Jul 20, 1950, 71 y.o.   MRN: [id number] Daughter called to inform DC has went through and Corey Harold has been called. Haydee Salter, RN

## 2022-04-17 NOTE — Discharge Summary (Signed)
Physician Discharge Summary   Patient: Jason Jones MRN: [id number] DOB: 04/14/1951  Admit date:     04/12/2022  Discharge date: 04/17/22  Discharge Physician: Cordelia Poche, MD   PCP: Arthur Holms, NP   Recommendations at discharge:  PCP follow-up Urology follow-up for hematuria/hyronephrosis  Discharge Diagnoses: Active Problems:   Primary hypertension   AKI (acute kidney injury) ([place])   Malnutrition of moderate degree   History of CVA (cerebrovascular accident)   BPH (benign prostatic hyperplasia)  Resolved Problems:   * No resolved hospital problems. *  Hospital Course: Carlester Kasparek is a 71 y.o. male with a history of recent CVA, prostate cancer, chronic alcohol use, hypertension, GERD, asthma who presented secondary to AKI secondary to obstructive uropathy. Urology consulted and foley catheter placed patient developed hematuria with stable hemoglobin complicated by aspirin and Plavix use.  Assessment and Plan:  Gross hematuria Complicated by need for dual antiplatelet therapy. Urology consulted and likely etiology is trauma in setting of friable post-radiation prostatic tissue. Persistent hematuria but hemoglobin is stable. Patient to follow-up with urology as an outpatient.  Hydronephrosis Mild. Bilateral. Secondary to obstruction requiring foley catheter placement. Urology consulted. Foley to continue on discharge.  AKI Creatinine of 2.05 on admission. Secondary to obstructive uropathy. Resolved with foley catheter placement.  Dysphagia Secondary to recent stroke. Dysphagia 1 diet with nectar thickened liquid.  Primary hypertension Continue home amlodipine  BPH History of prostate cancer Continue doxazosin.  Asthma Continue albuterol as needed.  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 Patient with management on dual antiplatelet therapy for 3 months with Plavix recommended until 06/25/2021 and indefinite  aspirin. Patient to follow-up with neurology as an outpatient.  Consultants: Urology Procedures performed: Foley catheter insertion (11/3)  Disposition: Skilled nursing facility   Diet recommendation: Cardiac diet/Dysphagia 1 diet. Nectar thickened liquid   DISCHARGE MEDICATION: Allergies as of 04/17/2022       Reactions   No Known Allergies         Medication List     TAKE these medications    albuterol (2.5 MG/3ML) 0.083% nebulizer solution Commonly known as: PROVENTIL Take 3 mLs (2.5 mg total) by nebulization every 4 (four) hours as needed for wheezing or shortness of breath.   amLODipine 5 MG tablet Commonly known as: NORVASC Take 1 tablet (5 mg total) by mouth daily.   amoxicillin-clavulanate 875-125 MG tablet Commonly known as: AUGMENTIN Take 1 tablet by mouth every 12 (twelve) hours for 5 days.   aspirin 81 MG chewable tablet Chew 1 tablet (81 mg total) by mouth daily.   bisacodyl 10 MG suppository Commonly known as: DULCOLAX Place 10 mg rectally once.   clopidogrel 75 MG tablet Commonly known as: PLAVIX Take 1 tablet (75 mg total) by mouth daily. Take 1 tablet by mouth until 06/25/22 to complete a 3 month course. What changed: additional instructions   doxazosin 1 MG tablet Commonly known as: CARDURA Take 1 tablet (1 mg total) by mouth daily. For BPH   feeding supplement Liqd Take 237 mLs by mouth 2 (two) times daily between meals. What changed: when to take this   food thickener Gel Commonly known as: SIMPLYTHICK (NECTAR/LEVEL 2/MILDLY THICK) Take 1 packet by mouth as needed.   multivitamin with minerals Tabs tablet Take 1 tablet by mouth daily.   nicotine 21 mg/24hr patch Commonly known as: NICODERM CQ - dosed in mg/24 hours Place 1 patch (21 mg total) onto the skin daily.  polyethylene glycol 17 g packet Commonly known as: MIRALAX / GLYCOLAX Take 17 g by mouth daily as needed.   rosuvastatin 20 MG tablet Commonly known as:  CRESTOR Take 1 tablet (20 mg total) by mouth daily.   sennosides-docusate sodium 8.6-50 MG tablet Commonly known as: SENOKOT-S Take 1 tablet by mouth at bedtime.   sodium chloride 0.65 % Soln nasal spray Commonly known as: OCEAN Place 1 spray into both nostrils as needed for congestion.        Follow-up Information     Karen Kays, NP Follow up on 04/17/2022.   Specialty: Nurse Practitioner Why: 8:15am Contact information: [address] [place] [address] [phone]         Arthur Holms, NP. Schedule an appointment as soon as possible for a visit in 1 week(s).   Specialty: Nurse Practitioner Contact information: [place] [address] [phone]                Discharge Exam: BP 90/70 (BP Location: Right Arm)   Pulse 94   Temp 98.1 F (36.7 C) (Oral)   Resp 18   Ht '5\' 11"'$  (1.803 m)   Wt 62.4 kg   SpO2 92%   BMI 19.19 kg/m   General exam: Appears calm and comfortable Respiratory system: Clear to auscultation. Respiratory effort normal. Cardiovascular system: S1 & S2 heard, RRR. No murmurs, rubs, gallops or clicks. Gastrointestinal system: Abdomen is nondistended, soft and nontender. Normal bowel sounds heard. Central nervous system: Alert and oriented. Left hemiparesis. Dysarthria, Musculoskeletal: No edema. No calf tenderness Skin: No cyanosis. No rashes Psychiatry: Judgement and insight appear normal. Mood & affect appropriate.   Condition at discharge: stable  The results of significant diagnostics from this hospitalization (including imaging, microbiology, ancillary and laboratory) are listed below for reference.   Imaging Studies: DG Shoulder Left Port  Result Date: 04/17/2022 CLINICAL DATA:  LEFT shoulder pain post fall EXAM: LEFT SHOULDER COMPARISON:  01/18/2012 FINDINGS: Osseous demineralization. Nonunion of old distal LEFT clavicular fracture. AC joint alignment normal. Displaced fracture lateral LEFT  second rib, age indeterminate. Likely old fractures of the LEFT third fourth and fifth ribs. No glenohumeral fracture or dislocation. Atherosclerotic calcification aortic arch. IMPRESSION: Old fractures distal LEFT clavicle and LEFT third fourth fifth ribs. Displaced fracture lateral LEFT second rib, age indeterminate in character but due to the adjacent fractures of additional ribs, favor old. No acute glenohumeral fracture or dislocation seen. Aortic Atherosclerosis (ICD10-I70.0). Electronically Signed   By: Lavonia Dana M.D.   On: 04/17/2022 08:56   DG Swallowing Func-Speech Pathology  Result Date: 04/16/2022 Table formatting from the original result was not included. Objective Swallowing Evaluation: Type of Study: MBS-Modified Barium Swallow Study  Patient Details Name: Johanna Stafford MRN: [id number] Date of Birth: [birth year] Today's Date: 04/16/2022 Time: SLP Start Time (ACUTE ONLY): 1218 -SLP Stop Time (ACUTE ONLY): 2505 SLP Time Calculation (min) (ACUTE ONLY): 20 min Past Medical History: Past Medical History: Diagnosis Date  Arthritis   Asthma   Cancer ([place])   Chronic alcohol abuse   Dyspnea   GERD (gastroesophageal reflux disease)   PRN  ---  TAKES BAKING SODA IN WATER  History of pneumothorax   04-28-2006  fell, left fx rib--  resolved w/ chest tube  Hypertension   Nocturia   Poor dental hygiene   Right inguinal hernia   Weak urinary stream  Past Surgical History: Past Surgical History: Procedure Laterality Date  COLONOSCOPY  01/08/2021  GOLD SEED IMPLANT N/A 05/23/2021  Procedure: GOLD SEED IMPLANT;  Surgeon: Ceasar Mons, MD;  Location: [facility];  Service: Urology;  Laterality: N/A;  INGUINAL HERNIA REPAIR Right 04/23/2017  Procedure: OPEN RIGHT INGUINAL HERNIA REPAIR WITH MESH;  Surgeon: Kinsinger, Arta Bruce, MD;  Location: [facility];  Service: General;  Laterality: Right;  GENERAL COMBINED WITH REGIONAL FOR POST OP PAIN   INSERTION OF MESH Right 04/23/2017  Procedure:  INSERTION OF MESH;  Surgeon: Kinsinger, Arta Bruce, MD;  Location: [facility];  Service: General;  Laterality: Right;  GENERAL COMBINED WITH REGIONAL FOR POST OP PAIN   LIPOMA EXCISION Right 02/22/2015  Procedure: RIGHT SHOULDER MASS EXCISION 15 CM SQ;  Surgeon: Mickeal Skinner, MD;  Location: [facility];  Service: General;  Laterality: Right;  SPACE OAR INSTILLATION N/A 05/23/2021  Procedure: SPACE OAR INSTILLATION;  Surgeon: Ceasar Mons, MD;  Location: [facility];  Service: Urology;  Laterality: N/A;  TRANSRECTAL ULTRASOUND N/A 05/23/2021  Procedure: TRANSRECTAL ULTRASOUND;  Surgeon: Ceasar Mons, MD;  Location: [facility];  Service: Urology;  Laterality: N/A; HPI: Patient is a 71 y.o. male with PMH: recent CVA with left sided paresis, dysphagia, dysarthria, chronic alcohol use, HTN, GERD, asthma. He has been at a SNF since CVA. He presented to the hospital on 04/12/22 from his SNF with generalized weakness, supra pubic pain and hematuria. He reports being constipated and that he had not had a BM for 5 days. In ED, he was afebrile, tachycardic and normotensive. He was diagnosed with gross hematuria likely related to traumatic catheter manipulation and friable post-radiation prostatic tissue.  Pt with aspiration on mbs in october after his cva - small amount of aspiration that did not clear with cued cough.  Chin tuck with thin advised - pt is impulsive. He reports incr cough with food more than liquids.  Subjective: alert in swallow chair  Recommendations for follow up therapy are one component of a multi-disciplinary discharge planning process, led by the attending physician.  Recommendations may be updated based on patient status, additional functional criteria and insurance authorization. Assessment / Plan / Recommendation   04/16/2022   1:03 PM Clinical Impressions Clinical Impression Pt presents with mild oral dysphagia mostly characterized by impaired  oral control due to prior CVA resulting in premature spillage of boluses into pharynx and minimal oral retention.  Improvement in swallow function noted since prior MBS.  No aspiration noted and no reflexive coughing observed during MBS.  Pt also was sitting FULLY upright and bolus amounts were controlled.  Pharyngeal swallow is strong without retention, despite pt sensing retention in pharynx with pudding and solid.       Trace penetration of thin due to spillage to pyriform sinus prior to swallow trigger -  noted that was inconsistent and aspiration was not observed.  Upon esophageal sweep, pt appeared with mild barium retention - without sensation. Barium tablet given with nectar *per pt wishes*  appeared to halt at distal esophagus - clearing with pudding bolus. Suspect pt has component of esophageal dysmotility - and he admits to issues with reflux- radiologist not present to confirm and MBS does not diagnosis in distal esophagus. Pt does endorse h/o reflux.   Recommend advance diet to Dys3/thin with strict precautions. Prefer use of straw as it facilitates improved swallow efficiency/oral transiting.  Medications with nectar advised - Whole.    SLP instructed pt to stay upright for at least 30 minutes after meals  as he prefers to lay down after - Strict follow up with SNF SLP recommended. Advised pt to focus on improving strength of cough and voice to maximize pulmonary clearance using teach back.  SLP Visit Diagnosis Dysphagia, oral phase (R13.11) Impact on safety and function Mild aspiration risk     04/16/2022   1:03 PM Treatment Recommendations Treatment Recommendations Therapy as outlined in treatment plan below     04/16/2022   1:05 PM Prognosis Prognosis for Safe Diet Advancement Good Barriers to Reach Goals Cognitive deficits;Other (Comment)   04/16/2022   1:03 PM Diet Recommendations SLP Diet Recommendations Dysphagia 3 (Mech soft) solids;Thin liquid Liquid Administration via Straw Medication  Administration Whole meds with liquid Compensations Minimize environmental distractions;Slow rate;Small sips/bites     04/16/2022   1:03 PM Other Recommendations Oral Care Recommendations Oral care BID;Staff/trained caregiver to provide oral care Other Recommendations Clarify dietary restrictions Follow Up Recommendations Skilled nursing-short term rehab (<3 hours/day) Assistance recommended at discharge Frequent or constant Supervision/Assistance Functional Status Assessment Patient has had a recent decline in their functional status and demonstrates the ability to make significant improvements in function in a reasonable and predictable amount of time.   04/16/2022   1:03 PM Frequency and Duration  Speech Therapy Frequency (ACUTE ONLY) min 1 x/week Treatment Duration 1 week     04/16/2022  12:58 PM Oral Phase Oral Phase Impaired Oral - Nectar Teaspoon Premature spillage;Decreased bolus cohesion;Weak lingual manipulation Oral - Nectar Cup Premature spillage;Decreased bolus cohesion;Weak lingual manipulation Oral - Nectar Straw Premature spillage;Decreased bolus cohesion;Weak lingual manipulation Oral - Thin Teaspoon Decreased bolus cohesion;Premature spillage;Weak lingual manipulation Oral - Thin Cup Decreased bolus cohesion;Premature spillage;Weak lingual manipulation Oral - Thin Straw Weak lingual manipulation;Decreased bolus cohesion;Premature spillage Oral - Puree Weak lingual manipulation;Premature spillage;Decreased bolus cohesion Oral - Mech Soft Decreased bolus cohesion;Premature spillage;Weak lingual manipulation;Impaired mastication Oral - Regular NT Oral - Pill Premature spillage;Decreased bolus cohesion;Weak lingual manipulation    04/16/2022   1:00 PM Pharyngeal Phase Pharyngeal Phase Impaired Pharyngeal- Nectar Teaspoon Delayed swallow initiation-pyriform sinuses Pharyngeal Material does not enter airway Pharyngeal- Nectar Cup Delayed swallow initiation-pyriform sinuses Pharyngeal Material does not enter  airway Pharyngeal- Nectar Straw NT Pharyngeal- Thin Teaspoon Delayed swallow initiation-pyriform sinuses Pharyngeal Material does not enter airway Pharyngeal- Thin Cup Penetration/Aspiration during swallow;Delayed swallow initiation-pyriform sinuses Pharyngeal- Thin Straw Delayed swallow initiation-pyriform sinuses;Penetration/Aspiration during swallow Pharyngeal Material enters airway, remains ABOVE vocal cords and not ejected out Pharyngeal- Puree Delayed swallow initiation-vallecula Pharyngeal Material does not enter airway Pharyngeal- Mechanical Soft Delayed swallow initiation-vallecula Pharyngeal Material does not enter airway Pharyngeal- Regular NT Pharyngeal- Pill Delayed swallow initiation-vallecula    04/16/2022   1:02 PM Cervical Esophageal Phase  Cervical Esophageal Phase Impaired Cervical Esophageal Comment see MBS narrative Kathleen Lime, MS [place] Office [phone] Pager [phone] Macario Golds 04/16/2022, 1:08 PM                     US RENAL  Result Date: 04/14/2022 CLINICAL DATA:  Hydronephrosis. EXAM: RENAL / URINARY TRACT ULTRASOUND COMPLETE COMPARISON:  Abdominopelvic CT 04/12/2022, 2 days prior FINDINGS: Right Kidney: Renal measurements: 10.1 x 4.8 x 4.9 cm = volume: 125 mL. Hydronephrosis on prior CT has resolved. There is increased renal parenchymal echogenicity. No evidence of focal lesion or stone. Left Kidney: Renal measurements: 9.4 x 5.4 x 5.6 cm = volume: 149 mL. Hydronephrosis on prior CT has resolved. There is increased renal parenchymal echogenicity. No evidence of focal renal lesion  or stone. Bladder: Circumferential bladder wall thickening with Foley catheter in place. Mass effect from enlarged prostate. Other: Enlarged prostate gland spans 5.4 x 4.2 x 6.5 cm, volume of 77 cc. IMPRESSION: 1. Bilateral hydronephrosis on prior CT has resolved. 2. Increased renal parenchymal echogenicity typical of chronic medical renal disease. 3. Diffuse bladder wall  thickening. This may be related to chronic bladder outlet obstruction in the setting of enlarged prostate. Electronically Signed   By: Keith Rake M.D.   On: 04/14/2022 20:58   CT Abdomen Pelvis W Contrast  Result Date: 04/12/2022 CLINICAL DATA:  Abdominal pain, hematuria, history of prostate carcinoma EXAM: CT ABDOMEN AND PELVIS WITH CONTRAST TECHNIQUE: Multidetector CT imaging of the abdomen and pelvis was performed using the standard protocol following bolus administration of intravenous contrast. RADIATION DOSE REDUCTION: This exam was performed according to the departmental dose-optimization program which includes automated exposure control, adjustment of the mA and/or kV according to patient size and/or use of iterative reconstruction technique. CONTRAST:  127m OMNIPAQUE IOHEXOL 300 MG/ML  SOLN COMPARISON:  Abdomen radiograph done on 03/24/2022 FINDINGS: Lower chest: There are linear patchy densities in left lower lung field. Hepatobiliary: No focal abnormalities are seen in liver. There is no dilation of bile ducts. Motion artifacts limit evaluation of gallbladder. Pancreas: No focal abnormalities are seen. Spleen: Unremarkable. Adrenals/Urinary Tract: Adrenals are unremarkable. There is mild hydronephrosis in both kidneys. There are no demonstrable renal or ureteral stones. There is marked diffuse wall thickening in urinary bladder. Foley catheter is seen in the bladder. There is increased density in the dependent portion of urinary bladder lumen, possibly blood clot. Prostate is enlarged projecting into the base of the bladder. Small diverticulum is noted in the right lateral margin of the urinary bladder. Stomach/Bowel: Stomach is not distended. Small bowel loops are not dilated. Appendix is unremarkable. There is no significant wall thickening in colon. Scattered diverticula are seen. There are no signs of focal acute diverticulitis. Vascular/Lymphatic: Scattered arterial calcifications are  seen. Reproductive: Prostate is enlarged. There are surgical clips in prostate. Other: There is no ascites or pneumoperitoneum. Large umbilical hernia containing fat is seen. Musculoskeletal: Degenerative changes are noted in lumbar spine, more so at L4-L5 level. IMPRESSION: There is no evidence of intestinal obstruction or pneumoperitoneum. There is mild hydronephrosis in both kidneys. Both ureters are dilated. There are no opaque renal or ureteral stones. Prominence of collecting systems in both kidneys may suggest partial obstruction at the ureterovesical junctions. There is marked diffuse wall thickening in urinary bladder which may be due to chronic bladder outlet obstruction or cystitis. High density in the dependent portion of the bladder lumen may suggest blood products. Foley catheter is seen in the bladder. Prostate is enlarged projecting into the base of the urinary bladder. Linear patchy infiltrate in left lower lung fields may suggest atelectasis/pneumonia. Diverticulosis of colon without signs of focal diverticulitis. Other findings as described in the body of the report. Electronically Signed   By: PElmer PickerM.D.   On: 04/12/2022 15:42   DG Swallowing Func-Speech Pathology  Result Date: 03/25/2022 Table formatting from the original result was not included. Objective Swallowing Evaluation: Type of Study: Bedside Swallow Evaluation  Patient Details Name: CJathen SudanoMRN: [id number] of Birth: [id number]'s Date: 03/25/2022 Time: SLP Start Time (ACUTE ONLY): 16222-SLP Stop Time (ACUTE ONLY): 19798SLP Time Calculation (min) (ACUTE ONLY): 20 min Past Medical History: Past Medical History: Diagnosis Date  Arthritis   Ast[place]   [place]  Chronic alcohol abuse   Dyspnea   GERD (gastroesophageal reflux disease)   PRN  ---  TAKES BAKING SODA IN WATER  History of pneumothorax   04-28-2006  fell, left fx rib--  resolved w/ chest tube  Hypertension   Nocturia   Poor dental hygiene    Right inguinal hernia   Weak urinary stream  Past Surgical History: Past Surgical History: Procedure Laterality Date  COLONOSCOPY  01/08/2021  GOLD SEED IMPLANT N/A 05/23/2021  Procedure: GOLD SEED IMPLANT;  Surgeon: Ceasar Mons, MD;  Location: [facility];  Service: Urology;  Laterality: N/A;  INGUINAL HERNIA REPAIR Right 04/23/2017  Procedure: OPEN RIGHT INGUINAL HERNIA REPAIR WITH MESH;  Surgeon: Kinsinger, Arta Bruce, MD;  Location: [facility];  Service: General;  Laterality: Right;  GENERAL COMBINED WITH REGIONAL FOR POST OP PAIN   INSERTION OF MESH Right 04/23/2017  Procedure: INSERTION OF MESH;  Surgeon: Kinsinger, Arta Bruce, MD;  Location: [facility];  Service: General;  Laterality: Right;  GENERAL COMBINED WITH REGIONAL FOR POST OP PAIN   LIPOMA EXCISION Right 02/22/2015  Procedure: RIGHT SHOULDER MASS EXCISION 15 CM SQ;  Surgeon: Mickeal Skinner, MD;  Location[facility];  Service: General;  Laterality: Right;  SPACE OAR INSTILLATION N/A 05/23/2021  Procedure: SPACE OAR INSTILLATION;  Surgeon: Ceasar Mons, MD;  [facility];  Service: Urology;  Laterality: N/A;  TRANSRECTAL ULTRASOUND N/A 05/23/2021  Procedure: TRANSRECTAL ULTRASOUND;  Surgeon: Ceasar Mons, MD;  [facility]: [facility];  Service: Urology;  Laterality: N/A; HPI: Pt is a 71 yo male wtih acute onset L sided weakness and fall 10/13, found down  and brought in by EMS 10/15. MRI revealed large R PCA CVA. PMH includes: GERD, alcohol use, prostate ca (2022) s/p external beam radiation (completed Feb 2023), prior Hepatitis C, HTN, tobacco use disorder  Subjective: asking for ice chips  Recommendations for follow up therapy are one component of a multi-disciplinary discharge planning process, led by the attending physician.  Recommendations may be updated based on patient status, additional functional criteria and insurance authorization. Assessment / Plan /  Recommendation   03/25/2022   3:05 PM Clinical Impressions Clinical Impression Pt presents with oropharyngeal dysphagia characterized by a pharyngeal delay and reduction in labial seal, poterior propulsion, tongue base retraction, and bolus cohesion. Pt exhibited vallecular residue, difficulty with A-P transport (regular texture solids and of the barium tablet), and liquid boluses were often triggered with the head of the bolus at the level of the pyriform sinuses. Pt demonstrated penetration (PAS 3) with thin liquids via cup when boluses were administered by the SLP, but this progressed to PAS 5 and intermittently to aspiration (PAS 7) when pt self-fed. A single instance of aspiration (PAS 7) was noted with a large bolus of nectar thick liquids, due to spillover of prematurely spilled liquid in the pyriform sinuses, but this could not be replicated. Laryngeal invasion was eliminated with use of smaller bolus sizes combined with a chin tuck posture. However, often due to impulsivity, pt consistently demonstrated difficulty implementing one or both of these. A dysphagia 3 diet with nectar thick liquids will be initiated at this time. However, SLP is hopeful that the pt will be able to advanced to thin liquids this week with training for use of compensatory strategies. SLP will continue to follow pt. SLP Visit Diagnosis Dysphagia, oropharyngeal phase (R13.12) Impact on safety and function Mild aspiration risk     03/25/2022  3:05 PM Treatment Recommendations Treatment Recommendations Therapy as outlined in treatment plan below     03/25/2022   3:05 PM Prognosis Prognosis for Safe Diet Advancement Good Barriers to Reach Goals Cognitive deficits   03/25/2022   3:05 PM Diet Recommendations SLP Diet Recommendations Dysphagia 3 (Mech soft) solids;Nectar thick liquid Liquid Administration via Cup;Straw Medication Administration Whole meds with liquid Compensations Minimize environmental distractions;Slow rate;Small  sips/bites Postural Changes Seated upright at 90 degrees     03/25/2022   3:05 PM Other Recommendations Oral Care Recommendations Oral care BID;Staff/trained caregiver to provide oral care Other Recommendations Order thickener from pharmacy Follow Up Recommendations -- Assistance recommended at discharge Frequent or constant Supervision/Assistance Functional Status Assessment Patient has had a recent decline in their functional status and demonstrates the ability to make significant improvements in function in a reasonable and predictable amount of time.   03/25/2022   3:05 PM Frequency and Duration  Speech Therapy Frequency (ACUTE ONLY) min 2x/week Treatment Duration 2 weeks     03/25/2022   3:05 PM Oral Phase Oral Phase Impaired Oral - Nectar Cup Weak lingual manipulation;Reduced posterior propulsion;Decreased bolus cohesion;Premature spillage Oral - Nectar Straw Weak lingual manipulation;Reduced posterior propulsion;Decreased bolus cohesion;Premature spillage Oral - Thin Cup Weak lingual manipulation;Reduced posterior propulsion;Decreased bolus cohesion;Premature spillage Oral - Thin Straw Weak lingual manipulation;Reduced posterior propulsion;Decreased bolus cohesion;Premature spillage Oral - Puree Weak lingual manipulation;Reduced posterior propulsion;Decreased bolus cohesion;Premature spillage Oral - Regular Weak lingual manipulation;Reduced posterior propulsion;Decreased bolus cohesion;Premature spillage;Impaired mastication Oral - Pill Weak lingual manipulation;Reduced posterior propulsion;Decreased bolus cohesion;Premature spillage    03/25/2022   3:05 PM Pharyngeal Phase Pharyngeal Phase Impaired Pharyngeal- Nectar Cup Delayed swallow initiation-pyriform sinuses;Pharyngeal residue - valleculae;Reduced tongue base retraction Pharyngeal- Nectar Straw Delayed swallow initiation-pyriform sinuses;Pharyngeal residue - valleculae;Reduced tongue base retraction;Penetration/Aspiration during swallow Pharyngeal  Material enters airway, remains ABOVE vocal cords and not ejected out Pharyngeal- Thin Cup Delayed swallow initiation-pyriform sinuses;Pharyngeal residue - valleculae;Reduced tongue base retraction;Penetration/Aspiration during swallow Pharyngeal Material enters airway, remains ABOVE vocal cords and not ejected out;Material enters airway, CONTACTS cords and not ejected out;Material enters airway, passes BELOW cords and not ejected out despite cough attempt by patient Pharyngeal- Thin Straw Delayed swallow initiation-pyriform sinuses;Reduced tongue base retraction;Penetration/Aspiration during swallow Pharyngeal Material enters airway, remains ABOVE vocal cords and not ejected out;Material enters airway, CONTACTS cords and not ejected out;Material enters airway, passes BELOW cords and not ejected out despite cough attempt by patient Pharyngeal- Puree Delayed swallow initiation-pyriform sinuses;Pharyngeal residue - valleculae;Reduced tongue base retraction Pharyngeal- Regular Delayed swallow initiation-pyriform sinuses;Pharyngeal residue - valleculae;Reduced tongue base retraction Pharyngeal- Pill Delayed swallow initiation-pyriform sinuses;Reduced tongue base retraction    03/25/2022   3:05 PM Cervical Esophageal Phase  Cervical Esophageal Phase [facility] Shanika I. Hardin Negus, [place], [place] Office number [phone] Horton Marshall 03/25/2022, 3:19 PM                     ECHOCARDIOGRAM COMPLETE  Result Date: 03/25/2022    ECHOCARDIOGRAM REPORT   Patient Name:   ERIBERTO FELCH Date of Exam: 03/25/2022 Medical Rec #:  [id number]      Height:       71.0 in Accession #:    [id number]     Weight:       143.6 lb Date of Birth:  April 02, 1951      BSA:          1.832 m Patient Age:    72 years       BP:  159/96 mmHg Patient Gender: M              HR:           42 bpm. Exam Location:  Inpatient Procedure: 2D Echo, Color Doppler and Cardiac Doppler Indications:    stroke  History:         Patient has no prior history of Echocardiogram examinations.  Sonographer:    Melissa Morford RDCS (AE, PE) Referring Phys: [place]  1. Left ventricular ejection fraction, by estimation, is 60 to 65%. The left ventricle has normal function. The left ventricle has no regional wall motion abnormalities. Left ventricular diastolic parameters were normal.  2. Right ventricular systolic function is normal. The right ventricular size is normal.  3. The mitral valve is normal in structure. Trivial mitral valve regurgitation. No evidence of mitral stenosis.  4. The aortic valve is tricuspid. Aortic valve regurgitation is not visualized. Aortic valve sclerosis/calcification is present, without any evidence of aortic stenosis. Comparison(s): No prior Echocardiogram. Conclusion(s)/Recommendation(s): No intracardiac source of embolism detected on this transthoracic study. Consider a transesophageal echocardiogram to exclude cardiac source of embolism if clinically indicated. FINDINGS  Left Ventricle: Left ventricular ejection fraction, by estimation, is 60 to 65%. The left ventricle has normal function. The left ventricle has no regional wall motion abnormalities. The left ventricular internal cavity size was normal in size. There is  no left ventricular hypertrophy. Left ventricular diastolic parameters were normal. Right Ventricle: The right ventricular size is normal. No increase in right ventricular wall thickness. Right ventricular systolic function is normal. Left Atrium: Left atrial size was normal in size. Right Atrium: Right atrial size was normal in size. Pericardium: There is no evidence of pericardial effusion. Mitral Valve: The mitral valve is normal in structure. Trivial mitral valve regurgitation. No evidence of mitral valve stenosis. Tricuspid Valve: The tricuspid valve is normal in structure. Tricuspid valve regurgitation is trivial. Aortic Valve: The aortic valve is tricuspid. Aortic  valve regurgitation is not visualized. Aortic valve sclerosis/calcification is present, without any evidence of aortic stenosis. Pulmonic Valve: The pulmonic valve was not well visualized. Pulmonic valve regurgitation is trivial. Aorta: The aortic root and ascending aorta are structurally normal, with no evidence of dilitation. Venous: The inferior vena cava was not well visualized. IAS/Shunts: The atrial septum is grossly normal.  LEFT VENTRICLE PLAX 2D LVIDd:         4.50 cm   Diastology LVIDs:         3.10 cm   LV e' medial:    11.20 cm/s LV PW:         0.80 cm   LV E/e' medial:  9.4 LV IVS:        0.90 cm   LV e' lateral:   12.90 cm/s LVOT diam:     2.30 cm   LV E/e' lateral: 8.1 LV SV:         86 LV SV Index:   47 LVOT Area:     4.15 cm  RIGHT VENTRICLE RV S prime:     14.80 cm/s TAPSE (M-mode): 2.4 cm LEFT ATRIUM           Index        RIGHT ATRIUM           Index LA diam:      3.00 cm 1.64 cm/m   RA Area:     13.10 cm LA Vol (A4C): 36.9 ml 20.15 ml/m  RA Volume:  32.70 ml  17.85 ml/m  AORTIC VALVE LVOT Vmax:   71.10 cm/s LVOT Vmean:  51.400 cm/s LVOT VTI:    0.208 m  AORTA Ao Root diam: 3.40 cm Ao Asc diam:  3.10 cm MITRAL VALVE                TRICUSPID VALVE MV Area (PHT): 5.66 cm     TR Peak grad:   30.9 mmHg MV Decel Time: 134 msec     TR Vmax:        278.00 cm/s MV E velocity: 105.00 cm/s MV A velocity: 68.60 cm/s   SHUNTS MV E/A ratio:  1.53         Systemic VTI:  0.21 m                             Systemic Diam: 2.30 cm Gwyndolyn Kaufman MD Electronically signed by Gwyndolyn Kaufman MD Signature Date/Time: 03/25/2022/11:33:15 AM    Final    MR BRAIN WO CONTRAST  Result Date: 03/25/2022 CLINICAL DATA:  Acute neurologic deficit EXAM: MRI HEAD WITHOUT CONTRAST MRA HEAD WITHOUT CONTRAST MRA NECK WITHOUT AND WITH CONTRAST TECHNIQUE: Multiplanar, multi-echo pulse sequences of the brain and surrounding structures were acquired without intravenous contrast. Angiographic images of the Circle of  Willis were acquired using MRA technique without intravenous contrast. Angiographic images of the neck were acquired using MRA technique without and with intravenous contrast. Carotid stenosis measurements (when applicable) are obtained utilizing NASCET criteria, using the distal internal carotid diameter as the denominator. CONTRAST:  6.60m GADAVIST GADOBUTROL 1 MMOL/ML IV SOLN COMPARISON:  None Available. 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 No acute or chronic hemorrhage. There is multifocal hyperintense T2-weighted signal within the white matter. Generalized volume loss. The midline structures are normal. Vascular: Major flow voids are preserved. Skull and upper cervical spine: Normal calvarium and skull base. Visualized upper cervical spine and soft tissues are normal. Sinuses/Orbits:No paranasal sinus fluid levels or advanced mucosal thickening. No mastoid or middle ear effusion. Normal orbits. MRA HEAD FINDINGS POSTERIOR CIRCULATION: --Vertebral arteries: Normal --Inferior cerebellar arteries: Normal. --Basilar artery: Normal. --Superior cerebellar arteries: Normal. --Posterior cerebral arteries: Right P1 segment is occluded. Left PCA is normal. ANTERIOR CIRCULATION: --Intracranial internal carotid arteries: Normal. --Anterior cerebral arteries (ACA): Normal. Absent left A1 segment, normal variant --Middle cerebral arteries (MCA): Normal. ANATOMIC VARIANTS: None MRA NECK FINDINGS Examination is degraded by motion and poor contrast. Aortic arch: Unremarkable Right carotid system: Antegrade flow without significant stenosis. Left carotid system: Antegrade flow with probable 50% stenosis of the proximal ICA Vertebral arteries: Codominant.  Patent with antegrade flow. Other: None IMPRESSION: 1. 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 No hemorrhage or mass effect. 2. Occlusion of the right PCA P1 segment. 3. 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 Electronically Signed   By: KUlyses JarredM.D.   On: 03/25/2022 00:15   MR ANGIO NECK W WO CONTRAST  Result Date: 03/25/2022 CLINICAL DATA:  Acute neurologic deficit EXAM: MRI HEAD WITHOUT CONTRAST MRA HEAD WITHOUT CONTRAST MRA NECK WITHOUT AND WITH CONTRAST TECHNIQUE: Multiplanar, multi-echo pulse sequences of the brain and surrounding structures were acquired without intravenous contrast. Angiographic images of the Circle of Willis were acquired using MRA technique without  intravenous contrast. Angiographic images of the neck were acquired using MRA technique without and with intravenous contrast. Carotid stenosis measurements (when applicable) are obtained utilizing NASCET criteria, using the distal internal carotid diameter as the denominator. CONTRAST:  6.47m GADAVIST GADOBUTROL 1 MMOL/ML IV SOLN COMPARISON:  None Available. 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 No acute or chronic hemorrhage. There is multifocal hyperintense T2-weighted signal within the white matter. Generalized volume loss. The midline structures are normal. Vascular: Major flow voids are preserved. Skull and upper cervical spine: Normal calvarium and skull base. Visualized upper cervical spine and soft tissues are normal. Sinuses/Orbits:No paranasal sinus fluid levels or advanced mucosal thickening. No mastoid or middle ear effusion. Normal orbits. MRA HEAD FINDINGS POSTERIOR CIRCULATION: --Vertebral arteries: Normal --Inferior cerebellar arteries: Normal. --Basilar artery: Normal. --Superior cerebellar arteries: Normal. --Posterior cerebral arteries: Right P1 segment is occluded. Left PCA is normal. ANTERIOR CIRCULATION: --Intracranial internal  carotid arteries: Normal. --Anterior cerebral arteries (ACA): Normal. Absent left A1 segment, normal variant --Middle cerebral arteries (MCA): Normal. ANATOMIC VARIANTS: None MRA NECK FINDINGS Examination is degraded by motion and poor contrast. Aortic arch: Unremarkable Right carotid system: Antegrade flow without significant stenosis. Left carotid system: Antegrade flow with probable 50% stenosis of the proximal ICA Vertebral arteries: Codominant.  Patent with antegrade flow. Other: None IMPRESSION: 1. 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 No hemorrhage or mass effect. 2. Occlusion of the right PCA P1 segment. 3. 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 Electronically Signed   By: KUlyses JarredM.D.   On: 03/25/2022 00:15   MR ANGIO HEAD WO CONTRAST  Result Date: 03/25/2022 CLINICAL DATA:  Acute neurologic deficit EXAM: MRI HEAD WITHOUT CONTRAST MRA HEAD WITHOUT CONTRAST MRA NECK WITHOUT AND WITH CONTRAST TECHNIQUE: Multiplanar, multi-echo pulse sequences of the brain and surrounding structures were acquired without intravenous contrast. Angiographic images of the Circle of Willis were acquired using MRA technique without intravenous contrast. Angiographic images of the neck were acquired using MRA technique without and with intravenous contrast. Carotid stenosis measurements (when applicable) are obtained utilizing NASCET criteria, using the distal internal carotid diameter as the denominator. CONTRAST:  6.58mGADAVIST GADOBUTROL 1 MMOL/ML IV SOLN COMPARISON:  None Available. 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 No acute or chronic hemorrhage. There is multifocal hyperintense T2-weighted signal within the white matter. Generalized volume loss. The midline structures are normal.  Vascular: Major flow voids are preserved. Skull and upper cervical spine: Normal calvarium and skull base. Visualized upper cervical spine and soft tissues are normal. Sinuses/Orbits:No paranasal sinus fluid levels or advanced mucosal thickening. No mastoid or middle ear effusion. Normal orbits. MRA HEAD FINDINGS POSTERIOR CIRCULATION: --Vertebral arteries: Normal --Inferior cerebellar arteries: Normal. --Basilar artery: Normal. --Superior cerebellar arteries: Normal. --Posterior cerebral arteries: Right P1 segment is occluded. Left PCA is normal. ANTERIOR CIRCULATION: --Intracranial internal carotid arteries: Normal. --Anterior cerebral arteries (ACA): Normal. Absent left A1 segment, normal variant --Middle cerebral arteries (MCA): Normal. ANATOMIC VARIANTS: None MRA NECK FINDINGS Examination is degraded by motion and poor contrast. Aortic arch: Unremarkable Right carotid system: Antegrade flow without significant stenosis. Left carotid system: Antegrade flow with probable 50% stenosis of the proximal ICA Vertebral arteries: Codominant.  Patent with antegrade flow. Other: None IMPRESSION: 1. 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 No hemorrhage or mass effect. 2. Occlusion of the right PCA P1 segment. 3. 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 Electronically Signed   By: Ulyses Jarred M.D.   On: 03/25/2022 00:15   DG Chest 1 View  Result Date: 03/24/2022 CLINICAL DATA:  [id number] Encounter for imaging to screen for metal prior to magnetic resonance imaging (MRI) [id number] EXAM: CHEST  1 VIEW COMPARISON:  None Available. FINDINGS: Heart and mediastinal contours are within normal limits. No focal opacities or effusions. No acute bony abnormality. No radiopaque foreign bodies. Old healed left rib fractures. IMPRESSION: No active cardiopulmonary disease. Electronically Signed   By: Rolm Baptise M.D.   On: 03/24/2022  20:10   DG Abd 1 View  Result Date: 03/24/2022 CLINICAL DATA:  [id number] Encounter for imaging to screen for metal prior to magnetic resonance imaging (MRI) [id number] EXAM: ABDOMEN - 1 VIEW COMPARISON:  None Available. FINDINGS: Small clips project over the lower pelvis. No additional radiopaque foreign bodies. No organomegaly or free air. Nonobstructive bowel gas pattern. IMPRESSION: Small surgical clips project over the lower pelvis which are likely safe for MRI imaging. Electronically Signed   By: Rolm Baptise M.D.   On: 03/24/2022 20:10   CT HEAD WO CONTRAST  Result Date: 03/24/2022 CLINICAL DATA:  Found down, stroke 2 days ago EXAM: CT HEAD WITHOUT CONTRAST TECHNIQUE: Contiguous axial images were obtained from the base of the skull through the vertex without intravenous contrast. RADIATION DOSE REDUCTION: This exam was performed according to the departmental dose-optimization program which includes automated exposure control, adjustment of the mA and/or kV according to patient size and/or use of iterative reconstruction technique. COMPARISON:  01/18/2012 FINDINGS: Brain: No evidence of hemorrhage, hydrocephalus, extra-axial collection or mass lesion/mass effect. Hypodensity in the medial right occipital lobe, reflecting a subacute right PCA distribution infarct (series 3/image 14), concordant with the clinical history. Vascular: Intracranial atherosclerosis. Skull: Normal. Negative for fracture or focal lesion. Sinuses/Orbits: The visualized paranasal sinuses are essentially clear. The mastoid air cells are unopacified. Other: None. IMPRESSION: Subacute right PCA distribution infarct, concordant with the clinical history. No evidence of hemorrhagic transformation. Electronically Signed   By: Julian Hy M.D.   On: 03/24/2022 17:54    Microbiology: Results for orders placed or performed during the hospital encounter of 04/12/22  Urine Culture     Status: Abnormal   Collection Time: 04/12/22  1:46  PM   Specimen: Urine, Catheterized  Result Value Ref Range Status   Specimen Description   Final    URINE, CATHETERIZED Performed at [facility] [address], [place], [place] [address]    Special Requests   Final    NONE Performed at [facility], [place] [address], [place], [place] [address]    Culture (A)  Final    >=100,000 COLONIES/mL DIPHTHEROIDS(CORYNEBACTERIUM SPECIES) Standardized susceptibility testing for this organism is not available. Performed at [facility], [place] [address], [place], [place] [address]    Report Status 04/14/2022 FINAL  Final    Labs: CBC: Recent Labs  Lab 04/12/22 1346 04/13/22 1854 04/14/22 0553 04/15/22 1036  WBC 11.5*  --  6.5 5.3  NEUTROABS 10.5*  --  4.1 3.4  HGB 13.5 10.3* 10.9* 11.0*  HCT 41.1 31.9* 33.3* 34.5*  MCV 90.1  --  91.2 92.5  PLT 410*  --  348 063   Basic Metabolic Panel: Recent Labs  Lab 04/12/22 1346 04/13/22 0837 04/14/22 0553 04/15/22 1036  NA 138 138 139 141  K 4.8 3.8 3.6 3.6  CL 103 107 108 107  CO2 '23 24 26 27  '$ GLUCOSE 147* 128* 106* 108*  BUN 36* 25* 20 13  CREATININE 2.05* 1.25* 0.88 0.94  CALCIUM 9.5 9.2 8.8* 8.9   Liver Function Tests: Recent Labs  Lab 04/12/22 1346 04/14/22 0553  AST 28 26  ALT 31 27  ALKPHOS 59 43  BILITOT 0.6 0.5  PROT 8.6* 6.8  ALBUMIN 3.6 2.9*    Discharge time spent: 35 minutes.  Signed: Cordelia Poche, MD [facility] 04/17/2022

## 2022-04-17 NOTE — Progress Notes (Addendum)
    OVERNIGHT PROGRESS REPORT  Notified by RN for possible unwitnessed fall in room. Patient was observed by RN and NT on his knees at baseline orientation denying any impact to his head.  No visible injuries noted. Movement of extremities is at prior baseline. Vitals are at baseline for patient.  Only stated complaint was shoulder pain that appears to be pre-existing by his mention of "old pain". Portable imaging is ordered.  Patient has no obvious deformity, contusion, abrasions, puncture, bruising, tears, laceration, swelling.  Imaging - pending 0630 hrs.  Gershon Cull MSNA MSN ACNPC-AG Acute Care Nurse Practitioner [place]

## 2022-04-17 NOTE — TOC Transition Note (Addendum)
Transition of Care [facility]) - CM/SW Discharge Note   Patient Details  Name: Jason Jones MRN: [id number] Date of Birth: 10/09/1950  Transition of Care [facility] CM/SW Contact:  Angelita Ingles, RN Phone Number:[phone]  04/17/2022, 3:15 PM   Clinical Narrative:    Patient to discharge to [facility] Transportation has been scheduled per PTAR. Discharge packet at nurses station.Nurse has been updated. Daughter [name] has been updated. Discharge packet is at nurses station.   Please call report to: [place] Room # [phone]           Patient Goals and CMS Choice        Discharge Placement                       Discharge Plan and Services                                     Social Determinants of Health (SDOH) Interventions     Readmission Risk Interventions     No data to display

## 2022-04-26 ENCOUNTER — Ambulatory Visit: Payer: Medicare Other | Attending: Cardiology

## 2022-04-26 DIAGNOSIS — I4891 Unspecified atrial fibrillation: Secondary | ICD-10-CM | POA: Diagnosis not present

## 2022-04-26 DIAGNOSIS — I639 Cerebral infarction, unspecified: Secondary | ICD-10-CM | POA: Diagnosis not present

## 2022-04-30 NOTE — Progress Notes (Unsigned)
Patient: Jason Jones Date of Birth: [birth year]  Reason for Visit: Stroke Clinic Follow Up History from: Patient, brother  Primary Neurologist: Dr. Leonie Jones (Saw Dr. Erlinda Jones)  ASSESSMENT AND PLAN 71 y.o. year old male   82.  Stroke, right PCA territory infarct with right P1 occlusion -Likely due to large vessel disease -Discussed with Dr. Erlinda Jones, since gross hematuria will stop Plavix, continue aspirin 81 mg daily, along with urology consultation for hematuria with Foley catheter -Referral to pain medicine and rehabilitation for consideration of Botox for spasticity to the left arm and leg -Currently wearing Zio patch to rule out AFIB  2.  Hematuria, Foley catheter, prostate carcinoma 2022 status post beam radiation -Potential neurogenic bladder following stroke -Recommend urology consultation -See above, stop Plavix continue aspirin 81 mg daily given gross hematuria on exam today -Recommend facility check CBC to ensure hemoglobin is stable  3.  Hypertension -Goal BP less than 130/90 -Monitor, make sure not having low's  -On Norvasc  4.  Hyperlipidemia -LDL 84, goal less than 70 -Some elevation in AST/ALT, Normalized 04/14/22 -Started Crestor 20  5.  Dysphagia -Continue working with [place]  6.  Tobacco abuse -Has stopped smoking at this point  7.  Increased liver enzymes, chronic hep C -Liver enzymes normalized 04/14/22  HISTORY OF PRESENT ILLNESS: Today 05/01/22 Jason Jones presents today for stroke clinic follow-up.  Brought to the ER by EMS after being found down on the floor for 2 days prior on 03/24/22.  MRI showed right PCA territory infarct.  Had left hemianopia and left hemiplegia.  Discharged to rehab.   Remains on at [facility] for rehab, is involved in PT/OT/ST. He was living in a high-rise senior apartment prior. Remains on aspirin 81 mg and Plavix 75 mg. He has stopped cigarettes. Is currently wear Zio patch, was placed 1 week ago. BP 104/69 on Norvasc 5 mg. Started  on Crestor 20 mg. Swallowing is improved, gets thickened liquids, soft foods, not choking. 04/14/22 normal ALT AST. Speech is slurred, about 60% back to normal. Foley was placed after hospitalization due to inability to void, potentially neurogenic bladder? 04/12/22 he pulled the catheter out at the facility, went to ER, Admitted 04/12/22 for bilateral hydronephrosis, gross hematuria. Still some hematuria in the urine bag. Complicated by need for dual antiplatelet therapy. Urology consulted and likely etiology is trauma in setting of friable post-radiation prostatic tissue.   -Code stroke CT head showed subacute right PCA distribution infarct -MRI of the brain large area of acute infarction in the right PCA territory including portions of the right thalamus and right cerebral peduncle -MRA of the head occlusion of right PCA P1 segment, probable 50% stenosis of the proximal left ICA -2D echo 60 to 65% -LDL 84 -A1c 6.0 -No antithrombotic prior to admission, 3 months aspirin 81 mg daily and Plavix 75 mg daily, then aspirin alone given intracranial vascular occlusion -Sent to rehab  HISTORY  Copied 03/24/22 Dr. Lorrin Jones: Mechel Jones is a 71 y.o. male with PMH significant for alcohol use, GERD, hypertension hepatitis C, smoker who was brought in by EMS for being down on the floor at his home for the last 2 days.   Patient reports that on Friday 10/13, he had sudden onset left-sided weakness and slid out of his couch and onto the floor.  For the last 2 days has been banging on the wall and his neighbors eventually heard him and called 911.  He was noted to be weak on the  left side.  CT head demonstrated a subacute right PCA infarct.   He received Ativan before MRI and is somnolent and somewhat difficult to get history out of.   LKW: 03/22/2022. mRS: 0 tNKASE: Not offered he is outside the window. Thrombectomy: Not on for days outside the window.  REVIEW OF SYSTEMS: Out of a complete 14 system  review of symptoms, the patient complains only of the following symptoms, and all other reviewed systems are negative.  See HPI  ALLERGIES: Allergies  Allergen Reactions   No Known Allergies     HOME MEDICATIONS: Outpatient Medications Prior to Visit  Medication Sig Dispense Refill   albuterol (PROVENTIL) (2.5 MG/3ML) 0.083% nebulizer solution Take 3 mLs (2.5 mg total) by nebulization every 4 (four) hours as needed for wheezing or shortness of breath. 75 mL 12   amLODipine (NORVASC) 5 MG tablet Take 1 tablet (5 mg total) by mouth daily.     aspirin 81 MG chewable tablet Chew 1 tablet (81 mg total) by mouth daily.     clopidogrel (PLAVIX) 75 MG tablet Take 1 tablet (75 mg total) by mouth daily. Take 1 tablet by mouth until 06/25/22 to complete a 3 month course. (Patient taking differently: Take 75 mg by mouth daily.) 30 tablet 2   feeding supplement (ENSURE ENLIVE / ENSURE PLUS) LIQD Take 237 mLs by mouth 2 (two) times daily between meals. (Patient taking differently: Take 237 mLs by mouth 2 (two) times daily.) 237 mL 12   mirtazapine (REMERON) 7.5 MG tablet Take 7.5 mg by mouth at bedtime.     polyethylene glycol (MIRALAX / GLYCOLAX) 17 g packet Take 17 g by mouth daily as needed. 14 each 0   rosuvastatin (CRESTOR) 20 MG tablet Take 1 tablet (20 mg total) by mouth daily.     sennosides-docusate sodium (SENOKOT-S) 8.6-50 MG tablet Take 1 tablet by mouth at bedtime.     sodium chloride (OCEAN) 0.65 % SOLN nasal spray Place 1 spray into both nostrils as needed for congestion. 15 mL 0   food thickener (SIMPLYTHICK, NECTAR/LEVEL 2/MILDLY THICK,) GEL Take 1 packet by mouth as needed. (Patient not taking: Reported on 05/01/2022)     bisacodyl (DULCOLAX) 10 MG suppository Place 10 mg rectally once.     doxazosin (CARDURA) 1 MG tablet Take 1 tablet (1 mg total) by mouth daily. For BPH     Multiple Vitamin (MULTIVITAMIN WITH MINERALS) TABS tablet Take 1 tablet by mouth daily. 30 tablet 0   nicotine  (NICODERM CQ - DOSED IN MG/24 HOURS) 21 mg/24hr patch Place 1 patch (21 mg total) onto the skin daily. (Patient not taking: Reported on 04/12/2022) 28 patch 0   No facility-administered medications prior to visit.    PAST MEDICAL HISTORY: Past Medical History:  Diagnosis Date   Arthritis    Asthma    Cancer ([place])    Chronic alcohol abuse    Dyspnea    GERD (gastroesophageal reflux disease)    PRN  ---  TAKES BAKING SODA IN WATER   History of pneumothorax    04-28-2006  fell, left fx rib--  resolved w/ chest tube   Hypertension    Nocturia    Poor dental hygiene    Right inguinal hernia    Weak urinary stream     PAST SURGICAL HISTORY: Past Surgical History:  Procedure Laterality Date   COLONOSCOPY  01/08/2021   GOLD SEED IMPLANT N/A 05/23/2021   Procedure: GOLD SEED IMPLANT;  Surgeon: Ellison Hughs  Marjory Lies, MD;  Location: [facility];  Service: Urology;  Laterality: N/A;   INGUINAL HERNIA REPAIR Right 04/23/2017   Procedure: OPEN RIGHT INGUINAL HERNIA REPAIR WITH MESH;  Surgeon: Kinsinger, Arta Bruce, MD;  Location: [facility];  Service: General;  Laterality: Right;  GENERAL COMBINED WITH REGIONAL FOR POST OP PAIN    INSERTION OF MESH Right 04/23/2017   Procedure: INSERTION OF MESH;  Surgeon: Kinsinger, Arta Bruce, MD;  Location: [facility];  Service: General;  Laterality: Right;  GENERAL COMBINED WITH REGIONAL FOR POST OP PAIN    LIPOMA EXCISION Right 02/22/2015   Procedure: RIGHT SHOULDER MASS EXCISION 15 CM SQ;  Surgeon: Mickeal Skinner, MD;  Location[facility];  Service: General;  Laterality: Right;   SPACE OAR INSTILLATION N/A 05/23/2021   Procedure: SPACE OAR INSTILLATION;  Surgeon: Ceasar Mons, MD;  Location: [facility];  Service: Urology;  Laterality: N/A;   TRANSRECTAL ULTRASOUND N/A 05/23/2021   Procedure: TRANSRECTAL ULTRASOUND;  Surgeon: Ceasar Mons, MD;  Location: [facility];  Service:  Urology;  Laterality: N/A;    FAMILY HISTORY: Family History  Problem Relation Age of Onset   Prostate cancer Cousin    Prostate cancer Cousin     SOCIAL HISTORY: Social History   Socioeconomic History   Marital status: Divorced    Spouse name: Not on file   Number of children: Not on file   Years of education: Not on file   Highest education level: Not on file  Occupational History   Not on file  Tobacco Use   Smoking status: Every Day    Packs/day: 0.50    Years: 50.00    Total pack years: 25.00    Types: Cigarettes   Smokeless tobacco: Never  Vaping Use   Vaping Use: Never used  Substance and Sexual Activity   Alcohol use: Not Currently    Comment: last use 2-3 months ago ,   Drug use: Not Currently    Types: Marijuana    Comment: last use 2 years ago per pt   Sexual activity: Not on file  Other Topics Concern   Not on file  Social History Narrative   Not on file   Social Determinants of Health   Financial Resource Strain: Not on file  Food Insecurity: No Food Insecurity (04/12/2022)   Hunger Vital Sign    Worried About Running Out of Food in the Last Year: Never true    Ran Out of Food in the Last Year: Never true  Transportation Needs: No Transportation Needs (04/12/2022)   PRAPARE - Hydrologist (Medical): No    Lack of Transportation (Non-Medical): No  Physical Activity: Not on file  Stress: Not on file  Social Connections: Not on file  Intimate Partner Violence: Not At Risk (04/12/2022)   Humiliation, Afraid, Rape, and Kick questionnaire    Fear of Current or Ex-Partner: No    Emotionally Abused: No    Physically Abused: No    Sexually Abused: No   PHYSICAL EXAM  Vitals:   05/01/22 0803  BP: 104/69  Pulse: 93  Height: '5\' 11"'$  (1.803 m)   Body mass index is 19.19 kg/m.  Generalized: Elderly male,  somewhat frail,  in no acute distress, in wheelchair, gross hematuria to foley bag  Neurological examination   Mentation: Alert, cooperative, fell asleep once,  history is mostly provided by his brother, follows all commands, speech is slurred moderately  Cranial nerve II-XII: Pupils were equal round reactive to light. Extraocular movements were full, left hemianopia, left facial asymmetry at the mouth, asymmetric left shoulder shrug  Motor: right arm and leg strength appears 4/5, left arm is flaccid, flexion of the wrist, fingers, pain with 4-5 finger PROM, left leg increased tone, very limited antigravity movement, pain to left upper medial thigh with tension Sensory: Sensory testing is intact to soft touch on all 4 extremities. No evidence of extinction is noted.  Coordination: cannot perform with the left arm and leg Gait and station: In a wheelchair, cannot ambulate Reflexes: Deep tendon reflexes are symmetric   DIAGNOSTIC DATA (LABS, IMAGING, TESTING) - I reviewed patient records, labs, notes, testing and imaging myself where available.  Lab Results  Component Value Date   WBC 5.3 04/15/2022   HGB 11.4 (L) 04/17/2022   HCT 34.7 (L) 04/17/2022   MCV 92.5 04/15/2022   PLT 347 04/15/2022      Component Value Date/Time   NA 141 04/15/2022 1036   K 3.6 04/15/2022 1036   CL 107 04/15/2022 1036   CO2 27 04/15/2022 1036   GLUCOSE 108 (H) 04/15/2022 1036   BUN 13 04/15/2022 1036   CREATININE 0.94 04/15/2022 1036   CALCIUM 8.9 04/15/2022 1036   PROT 6.8 04/14/2022 0553   ALBUMIN 2.9 (L) 04/14/2022 0553   AST 26 04/14/2022 0553   ALT 27 04/14/2022 0553   ALKPHOS 43 04/14/2022 0553   BILITOT 0.5 04/14/2022 0553   GFRNONAA >60 04/15/2022 1036   GFRAA >60 04/21/2017 0957   Lab Results  Component Value Date   CHOL 130 03/25/2022   HDL 29 (L) 03/25/2022   LDLCALC 84 03/25/2022   TRIG 83 03/25/2022   CHOLHDL 4.5 03/25/2022   Lab Results  Component Value Date   HGBA1C 6.0 (H) 03/25/2022   No results found for: "VITAMINB12" Lab Results  Component Value Date   TSH 0.299 (L) 03/25/2022     Butler Denmark, AGNP-C, DNP 05/01/2022, 8:47 AM [facility] [address] [place], [place] [address] [phone]

## 2022-05-01 ENCOUNTER — Ambulatory Visit (INDEPENDENT_AMBULATORY_CARE_PROVIDER_SITE_OTHER): Payer: Medicare Other | Admitting: Neurology

## 2022-05-01 ENCOUNTER — Encounter: Payer: Self-pay | Admitting: Neurology

## 2022-05-01 VITALS — BP 104/69 | HR 93 | Ht 71.0 in

## 2022-05-01 DIAGNOSIS — I63531 Cerebral infarction due to unspecified occlusion or stenosis of right posterior cerebral artery: Secondary | ICD-10-CM

## 2022-05-01 DIAGNOSIS — C61 Malignant neoplasm of prostate: Secondary | ICD-10-CM

## 2022-05-01 DIAGNOSIS — I69391 Dysphagia following cerebral infarction: Secondary | ICD-10-CM

## 2022-05-01 DIAGNOSIS — R310 Gross hematuria: Secondary | ICD-10-CM

## 2022-05-01 DIAGNOSIS — R319 Hematuria, unspecified: Secondary | ICD-10-CM | POA: Insufficient documentation

## 2022-05-01 NOTE — Patient Instructions (Addendum)
Stop the Plavix, just continue the Aspirin 81 mg daily, stopping Plavix due to bleeding, have facility check CBC, CMP to follow up on HGB and Liver Enzymes   Continue wearing heart monitor, need to rule out AFIB  Monitor BP goal < 130/90, make sure not dropping too low   LDL < 70 goal  I will place a referral for consideration of Botox injection for spasticity   See you back in 4 months

## 2022-05-07 NOTE — Progress Notes (Signed)
I agree with the above plan 

## 2022-05-09 ENCOUNTER — Ambulatory Visit: Payer: Medicare Other | Attending: Cardiovascular Disease | Admitting: Cardiovascular Disease

## 2022-05-16 ENCOUNTER — Encounter: Payer: Self-pay | Admitting: Physician Assistant

## 2022-05-16 ENCOUNTER — Ambulatory Visit (INDEPENDENT_AMBULATORY_CARE_PROVIDER_SITE_OTHER): Payer: Medicare Other

## 2022-05-16 ENCOUNTER — Ambulatory Visit (INDEPENDENT_AMBULATORY_CARE_PROVIDER_SITE_OTHER): Payer: Medicare Other | Admitting: Physician Assistant

## 2022-05-16 DIAGNOSIS — S42032A Displaced fracture of lateral end of left clavicle, initial encounter for closed fracture: Secondary | ICD-10-CM | POA: Diagnosis not present

## 2022-05-16 NOTE — Progress Notes (Signed)
Office Visit Note   Patient: Jason Jones           Date of Birth: June 02, 1951           MRN: [id number] Visit Date: 05/16/2022              Requested by: Arthur Holms, NP [place],  [place] [address] PCP: Arthur Holms, NP  Chief Complaint  Patient presents with   Left Shoulder - Pain, Fracture      HPI: Mr. Daubert presents today referred by his nursing facility for a left clavicle fracture.  He recently has had a stroke that affects his left side.  Prior to this it does appear he was living independently.  His brother is with him for the exam and history.  During his hospitalization it was noted on x-rays that he had a clavicle fracture on the left side.  Patient states that he is hurt his shoulder back in high school when he played football and his brother verified this.  It does not bother him and is not painful.  He is more concerned about weakness in his left hand secondary to his recent stroke  Assessment & Plan: Visit Diagnoses:  1. Displaced fracture of lateral end of left clavicle, initial encounter for closed fracture     Plan: Chronic left distal clavicle fracture.  There is evidence of this fracture going back to 2007.  It is probably more displaced now but the skin is intact and he is not painful.  He may go forward with OT and PT.  He can use the sling for comfort to more support his arm because of his recent left-sided weakness.  Would follow-up with [place] if had any kind of skin breakdown or pain.  Follow-Up Instructions: Return if symptoms worsen or fail to improve.   Ortho Exam  Patient is alert, oriented, no adenopathy, well-dressed, normal affect, normal respiratory effort. Pleasant 71 year old gentleman sitting comfortably in his wheelchair with his brother at his side.  He has weakness of his left hand but it is warm and palpable distal pulse.  No tenderness in the shoulder.  He does have prominence of the clavicle but there is no tenting of the skin  no skin breakdown.  Movement of the shoulder passively does not elicit any pain.  No ecchymosis no swelling  Imaging: XR Clavicle Left  Result Date: 05/16/2022 2 view films of his clavicle demonstrate moderately displaced chronic distal clavicle fracture.  Mildly more displaced than previous films in 2013 no acute osseous injuries  No images are attached to the encounter.  Labs: Lab Results  Component Value Date   HGBA1C 6.0 (H) 03/25/2022   LABURIC 6.8 03/25/2022   REPTSTATUS 04/14/2022 FINAL 04/12/2022   CULT (A) 04/12/2022    >=100,000 COLONIES/mL DIPHTHEROIDS(CORYNEBACTERIUM SPECIES) Standardized susceptibility testing for this organism is not available. Performed at [facility], [place] [address], [place], [place] [address]      Lab Results  Component Value Date   ALBUMIN 2.9 (L) 04/14/2022   ALBUMIN 3.6 04/12/2022   ALBUMIN 3.1 (L) 03/26/2022    Lab Results  Component Value Date   MG 2.3 03/25/2022   No results found for: "VD25OH"  No results found for: "PREALBUMIN"    Latest Ref Rng & Units 04/17/2022   12:11 PM 04/15/2022   10:36 AM 04/14/2022    5:53 AM  CBC EXTENDED  WBC 4.0 - 10.5 K/uL  5.3  6.5   RBC  4.22 - 5.81 MIL/uL  3.73  3.65   Hemoglobin 13.0 - 17.0 g/dL 11.4  11.0  10.9   HCT 39.0 - 52.0 % 34.7  34.5  33.3   Platelets 150 - 400 K/uL  347  348   NEUT# 1.7 - 7.7 K/uL  3.4  4.1   Lymph# 0.7 - 4.0 K/uL  0.9  1.3      There is no height or weight on file to calculate BMI.  Orders:  Orders Placed This Encounter  Procedures   XR Clavicle Left   No orders of the defined types were placed in this encounter.    Procedures: No procedures performed  Clinical Data: No additional findings.  ROS:  All other systems negative, except as noted in the HPI. Review of Systems  Objective: Vital Signs: There were no vitals taken for this visit.  Specialty Comments:  No specialty comments available.  PMFS History: Patient Active Problem List    Diagnosis Date Noted   Hematuria 05/01/2022   History of CVA (cerebrovascular accident) 04/17/2022   BPH (benign prostatic hyperplasia) 04/17/2022   Malnutrition of moderate degree 04/15/2022   AKI (acute kidney injury) ([place]) 04/12/2022   Acute ischemic right PCA stroke ([place]) 03/25/2022   Primary hypertension 03/25/2022   Dysphagia due to recent cerebrovascular accident (CVA) 03/25/2022   Elevated liver enzymes 03/25/2022   Rhabdomyolysis 03/25/2022   Malignant neoplasm of prostate ([place]) 02/20/2021   Past Medical History:  Diagnosis Date   Arthritis    Asthma    Cancer ([place])    Chronic alcohol abuse    Dyspnea    GERD (gastroesophageal reflux disease)    PRN  ---  TAKES BAKING SODA IN WATER   History of pneumothorax    04-28-2006  fell, left fx rib--  resolved w/ chest tube   Hypertension    Nocturia    Poor dental hygiene    Right inguinal hernia    Weak urinary stream     Family History  Problem Relation Age of Onset   Prostate cancer Cousin    Prostate cancer Cousin     Past Surgical History:  Procedure Laterality Date   COLONOSCOPY  01/08/2021   GOLD SEED IMPLANT N/A 05/23/2021   Procedure: GOLD SEED IMPLANT;  Surgeon: Ceasar Mons, MD;  [facility];  Service: Urology;  Laterality: N/A;   INGUINAL HERNIA REPAIR Right 04/23/2017   Procedure: OPEN RIGHT INGUINAL HERNIA REPAIR WITH MESH;  Surgeon: Kinsinger, Arta Bruce, MD;  Location: [facility];  Service: General;  Laterality: Right;  GENERAL COMBINED WITH REGIONAL FOR POST OP PAIN    INSERTION OF MESH Right 04/23/2017   Procedure: INSERTION OF MESH;  Surgeon: Kinsinger, Arta Bruce, MD;  Location: [facility];  Service: General;  Laterality: Right;  GENERAL COMBINED WITH REGIONAL FOR POST OP PAIN    LIPOMA EXCISION Right 02/22/2015   Procedure: RIGHT SHOULDER MASS EXCISION 15 CM SQ;  Surgeon: Mickeal Skinner, MD;  [facility];  Service:  General;  Laterality: Right;   SPACE OAR INSTILLATION N/A 05/23/2021   Procedure: SPACE OAR INSTILLATION;  Surgeon: Ceasar Mons, MD;  [facility];  Service: Urology;  Laterality: N/A;   TRANSRECTAL ULTRASOUND N/A 05/23/2021   Procedure: TRANSRECTAL ULTRASOUND;  Surgeon: Ceasar Mons, MD;  [facility];  Service: Urology;  Laterality: N/A;   Social History   Occupational History   Not on file  Tobacco Use  Smoking status: Every Day    Packs/day: 0.50    Years: 50.00    Total pack years: 25.00    Types: Cigarettes   Smokeless tobacco: Never  Vaping Use   Vaping Use: Never used  Substance and Sexual Activity   Alcohol use: Not Currently    Comment: last use 2-3 months ago ,   Drug use: Not Currently    Types: Marijuana    Comment: last use 2 years ago per pt   Sexual activity: Not on file

## 2022-05-28 ENCOUNTER — Encounter: Payer: Self-pay | Admitting: *Deleted

## 2022-06-13 ENCOUNTER — Telehealth: Payer: Self-pay | Admitting: Neurology

## 2022-06-13 NOTE — Telephone Encounter (Signed)
Pain Medicine and rehab tried multiple times to reach out to the patient without success. The referral was closed.

## 2022-06-28 ENCOUNTER — Encounter (HOSPITAL_COMMUNITY): Payer: Self-pay

## 2022-06-28 ENCOUNTER — Observation Stay (HOSPITAL_COMMUNITY)
Admission: EM | Admit: 2022-06-28 | Discharge: 2022-06-30 | Disposition: A | Discharge status: Skilled Nursing Facility | Payer: 59 | Attending: Family Medicine | Admitting: Family Medicine

## 2022-06-28 ENCOUNTER — Other Ambulatory Visit: Payer: Self-pay

## 2022-06-28 DIAGNOSIS — J45909 Unspecified asthma, uncomplicated: Secondary | ICD-10-CM | POA: Diagnosis not present

## 2022-06-28 DIAGNOSIS — Z8546 Personal history of malignant neoplasm of prostate: Secondary | ICD-10-CM | POA: Diagnosis not present

## 2022-06-28 DIAGNOSIS — R21 Rash and other nonspecific skin eruption: Secondary | ICD-10-CM | POA: Diagnosis present

## 2022-06-28 DIAGNOSIS — N390 Urinary tract infection, site not specified: Secondary | ICD-10-CM

## 2022-06-28 DIAGNOSIS — Z7982 Long term (current) use of aspirin: Secondary | ICD-10-CM | POA: Insufficient documentation

## 2022-06-28 DIAGNOSIS — D649 Anemia, unspecified: Secondary | ICD-10-CM | POA: Diagnosis not present

## 2022-06-28 DIAGNOSIS — F17210 Nicotine dependence, cigarettes, uncomplicated: Secondary | ICD-10-CM | POA: Insufficient documentation

## 2022-06-28 DIAGNOSIS — Z79899 Other long term (current) drug therapy: Secondary | ICD-10-CM | POA: Diagnosis not present

## 2022-06-28 DIAGNOSIS — Z8673 Personal history of transient ischemic attack (TIA), and cerebral infarction without residual deficits: Secondary | ICD-10-CM

## 2022-06-28 DIAGNOSIS — I69391 Dysphagia following cerebral infarction: Secondary | ICD-10-CM

## 2022-06-28 DIAGNOSIS — N138 Other obstructive and reflux uropathy: Secondary | ICD-10-CM | POA: Diagnosis present

## 2022-06-28 DIAGNOSIS — I10 Essential (primary) hypertension: Secondary | ICD-10-CM | POA: Diagnosis not present

## 2022-06-28 DIAGNOSIS — N400 Enlarged prostate without lower urinary tract symptoms: Secondary | ICD-10-CM | POA: Diagnosis present

## 2022-06-28 LAB — URINALYSIS, ROUTINE W REFLEX MICROSCOPIC
Bilirubin Urine: NEGATIVE
Glucose, UA: NEGATIVE mg/dL
Ketones, ur: NEGATIVE mg/dL
Nitrite: NEGATIVE
Protein, ur: 100 mg/dL — AB
Specific Gravity, Urine: 1.012 (ref 1.005–1.030)
WBC, UA: >50 WBC/hpf — ABNORMAL HIGH (ref 0–5)
pH: 5 (ref 5.0–8.0)

## 2022-06-28 LAB — CBC WITH DIFFERENTIAL/PLATELET
Abs Immature Granulocytes: 0.1 10*3/uL — ABNORMAL HIGH (ref 0.00–0.07)
Basophils Absolute: 0 10*3/uL (ref 0.0–0.1)
Basophils Relative: 0 %
Eosinophils Absolute: 0.5 10*3/uL (ref 0.0–0.5)
Eosinophils Relative: 5 %
HCT: 24.3 % — ABNORMAL LOW (ref 39.0–52.0)
Hemoglobin: 7.5 g/dL — ABNORMAL LOW (ref 13.0–17.0)
Immature Granulocytes: 1 %
Lymphocytes Relative: 15 %
Lymphs Abs: 1.5 10*3/uL (ref 0.7–4.0)
MCH: 27.1 pg (ref 26.0–34.0)
MCHC: 30.9 g/dL (ref 30.0–36.0)
MCV: 87.7 fL (ref 80.0–100.0)
Monocytes Absolute: 0.5 10*3/uL (ref 0.1–1.0)
Monocytes Relative: 5 %
Neutro Abs: 7.2 10*3/uL (ref 1.7–7.7)
Neutrophils Relative %: 74 %
Platelets: 516 10*3/uL — ABNORMAL HIGH (ref 150–400)
RBC: 2.77 MIL/uL — ABNORMAL LOW (ref 4.22–5.81)
RDW: 14.9 % (ref 11.5–15.5)
WBC: 9.8 10*3/uL (ref 4.0–10.5)
nRBC: 0 % (ref 0.0–0.2)

## 2022-06-28 LAB — COMPREHENSIVE METABOLIC PANEL
ALT: 64 U/L — ABNORMAL HIGH (ref 0–44)
AST: 53 U/L — ABNORMAL HIGH (ref 15–41)
Albumin: 1.9 g/dL — ABNORMAL LOW (ref 3.5–5.0)
Alkaline Phosphatase: 48 U/L (ref 38–126)
Anion gap: 9 (ref 5–15)
BUN: 38 mg/dL — ABNORMAL HIGH (ref 8–23)
CO2: 27 mmol/L (ref 22–32)
Calcium: 8.8 mg/dL — ABNORMAL LOW (ref 8.9–10.3)
Chloride: 102 mmol/L (ref 98–111)
Creatinine, Ser: 1.15 mg/dL (ref 0.61–1.24)
GFR, Estimated: >60 mL/min (ref >60)
Glucose, Bld: 107 mg/dL — ABNORMAL HIGH (ref 70–99)
Potassium: 4.5 mmol/L (ref 3.5–5.1)
Sodium: 138 mmol/L (ref 135–145)
Total Bilirubin: 0.3 mg/dL (ref 0.3–1.2)
Total Protein: 7.1 g/dL (ref 6.5–8.1)

## 2022-06-28 LAB — ABO/RH: ABO/RH(D): A POS

## 2022-06-28 LAB — LACTIC ACID, PLASMA: Lactic Acid, Venous: 1.5 mmol/L (ref 0.5–1.9)

## 2022-06-28 LAB — FOLATE: Folate: 9.9 ng/mL (ref >5.9)

## 2022-06-28 LAB — IRON AND TIBC
Iron: 30 ug/dL — ABNORMAL LOW (ref 45–182)
Saturation Ratios: 16 % — ABNORMAL LOW (ref 17.9–39.5)
TIBC: 186 ug/dL — ABNORMAL LOW (ref 250–450)
UIBC: 156 ug/dL

## 2022-06-28 LAB — VITAMIN B12: Vitamin B-12: 678 pg/mL (ref 180–914)

## 2022-06-28 LAB — FERRITIN: Ferritin: 604 ng/mL — ABNORMAL HIGH (ref 24–336)

## 2022-06-28 LAB — PREPARE RBC (CROSSMATCH)

## 2022-06-28 MED ORDER — SENNOSIDES-DOCUSATE SODIUM 8.6-50 MG PO TABS: 1.0000 | ORAL_TABLET | Freq: Every evening | ORAL | Status: DC | PRN

## 2022-06-28 MED ORDER — POLYETHYLENE GLYCOL 3350 17 G PO PACK: 17.0000 g | PACK | Freq: Every day | ORAL | Status: DC | PRN

## 2022-06-28 MED ORDER — ACETAMINOPHEN 325 MG PO TABS: 650.0000 mg | ORAL_TABLET | Freq: Four times a day (QID) | ORAL | Status: DC | PRN

## 2022-06-28 MED ORDER — MIRTAZAPINE 15 MG PO TABS
7.5000 mg | ORAL_TABLET | Freq: Every day | ORAL | Status: DC
  Administered 2022-06-28 – 2022-06-29 (×2): 7.5 mg via ORAL
  Filled 2022-06-28 (×2): qty 1

## 2022-06-28 MED ORDER — SODIUM CHLORIDE 0.9 % IV SOLN
1.0000 g | Freq: Once | INTRAVENOUS | Status: AC
  Administered 2022-06-28: 1 g via INTRAVENOUS
  Filled 2022-06-28: qty 10

## 2022-06-28 MED ORDER — SODIUM CHLORIDE 0.9% IV SOLUTION: Freq: Once | INTRAVENOUS | Status: DC

## 2022-06-28 MED ORDER — DOXAZOSIN MESYLATE 1 MG PO TABS
1.0000 mg | ORAL_TABLET | Freq: Every morning | ORAL | Status: DC
  Administered 2022-06-29 – 2022-06-30 (×2): 1 mg via ORAL
  Filled 2022-06-28 (×2): qty 1

## 2022-06-28 MED ORDER — ONDANSETRON HCL 4 MG/2ML IJ SOLN: 4.0000 mg | Freq: Four times a day (QID) | INTRAMUSCULAR | Status: DC | PRN

## 2022-06-28 MED ORDER — SENNOSIDES-DOCUSATE SODIUM 8.6-50 MG PO TABS
1.0000 | ORAL_TABLET | Freq: Every day | ORAL | Status: DC
  Administered 2022-06-28 – 2022-06-29 (×2): 1 via ORAL
  Filled 2022-06-28 (×2): qty 1

## 2022-06-28 MED ORDER — ALBUTEROL SULFATE (2.5 MG/3ML) 0.083% IN NEBU: 2.5000 mg | INHALATION_SOLUTION | RESPIRATORY_TRACT | Status: DC | PRN

## 2022-06-28 MED ORDER — ACETAMINOPHEN 650 MG RE SUPP: 650.0000 mg | Freq: Four times a day (QID) | RECTAL | Status: DC | PRN

## 2022-06-28 MED ORDER — BACLOFEN 10 MG PO TABS
10.0000 mg | ORAL_TABLET | Freq: Two times a day (BID) | ORAL | Status: DC
  Administered 2022-06-28 – 2022-06-30 (×4): 10 mg via ORAL
  Filled 2022-06-28 (×4): qty 1

## 2022-06-28 MED ORDER — GABAPENTIN 100 MG PO CAPS
200.0000 mg | ORAL_CAPSULE | Freq: Two times a day (BID) | ORAL | Status: DC
  Administered 2022-06-28 – 2022-06-30 (×4): 200 mg via ORAL
  Filled 2022-06-28 (×4): qty 2

## 2022-06-28 MED ORDER — ONDANSETRON HCL 4 MG PO TABS: 4.0000 mg | ORAL_TABLET | Freq: Four times a day (QID) | ORAL | Status: DC | PRN

## 2022-06-28 MED ORDER — ROSUVASTATIN CALCIUM 5 MG PO TABS
10.0000 mg | ORAL_TABLET | Freq: Every day | ORAL | Status: DC
  Administered 2022-06-29: 10 mg via ORAL
  Filled 2022-06-28: qty 2

## 2022-06-28 NOTE — ED Triage Notes (Signed)
Contracted on left side hx stroke facility called ems for rash x 2 weeks to limbs and low hgb chronic foley recently dx with e coli in urine alert oriented no complaints for ems other than chronic pain pt did not know he was coming here and was cursing at EMS   80/60 with manual was agitated and ems not able to obtain vitals   100/86 documented last at facility

## 2022-06-28 NOTE — H&P (Signed)
History and Physical    Jason Jones LYY:[id number] DOB: 1951/04/01 DOA: 06/28/2022  PCP: Arthur Holms, NP  Patient coming from: SNF  I have personally briefly reviewed patient's old medical records in [place]  Chief Complaint: Anemia  HPI: Jason Jones is a 72 y.o. 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  Presenting from SNF for evaluation of anemia.  Reportedly he was also hypotensive at the facility and recently diagnosed with E. coli UTI.  Patient has a history of gross hematuria when admitted November 2023.  This was felt due to trauma in setting of friable postradiation prostatic tissue.  He has had a Foley catheter in place since that admission.  He was seen in urology office on follow-up 05/01/2022.  At that time due to persistent hematuria he was taken off of Plavix and recommended to continue on aspirin alone.  Patient currently denies any obvious bleeding including hematemesis, hemoptysis, hematochezia, melena, or hematuria.  He does report some lightheadedness.  He denies any chest pain, dyspnea, nausea, vomiting.  ED Course  Labs/Imaging on admission: I have personally reviewed following labs and imaging studies.  Initial vitals showed BP 116/47, pulse 84, RR 15, temp 94.7 F, SpO2 97% on room air.  Labs show hemoglobin 7.5 (11.4 on 04/17/2022), platelets 516,000, WBC 9.8, sodium 138, potassium 4.5, bicarb 27, BUN 38, creatinine 1.15, serum glucose 107, albumin 1.9, lactic acid 1.5.  Urinalysis shows negative nitrates, large leukocytes, 21-50 RBCs, >50 WBCs, few bacteria.  Urine culture ordered and pending.  Blood cultures in process.  FOBT negative per EDP.  Patient was given IV ceftriaxone 1 g  in order to receive 1 unit PRBC transfusion.  The hospitalist service was consulted to admit for further evaluation and management.  Review of Systems: All systems reviewed and are negative except as documented in history of present illness above.   Past Medical History:  Diagnosis Date   Arthritis    Asthma    Cancer ([place])    Chronic alcohol abuse    Dyspnea    GERD (gastroesophageal reflux disease)    PRN  ---  TAKES BAKING SODA IN WATER   History of pneumothorax    04-28-2006  fell, left fx rib--  resolved w/ chest tube   Hypertension    Nocturia    Poor dental hygiene    Right inguinal hernia    Weak urinary stream     Past Surgical History:  Procedure Laterality Date   COLONOSCOPY  01/08/2021   GOLD SEED IMPLANT N/A 05/23/2021   Procedure: GOLD SEED IMPLANT;  Surgeon: Ceasar Mons, MD;  Location: [facility];  Service: Urology;  Laterality: N/A;   INGUINAL HERNIA REPAIR Right 04/23/2017   Procedure: OPEN RIGHT INGUINAL HERNIA REPAIR WITH MESH;  Surgeon: Kinsinger, Arta Bruce, MD;  Location: [facility];  Service: General;  Laterality: Right;  GENERAL COMBINED WITH REGIONAL FOR POST OP PAIN    INSERTION OF MESH Right 04/23/2017   Procedure: INSERTION OF MESH;  Surgeon: Kinsinger, Arta Bruce, MD;  Location: [facility];  Service: General;  Laterality: Right;  GENERAL COMBINED WITH REGIONAL FOR POST OP PAIN    LIPOMA EXCISION Right 02/22/2015   Procedure: RIGHT SHOULDER MASS EXCISION 15 CM SQ;  Surgeon: Lurena Joiner  Sondra Come, MD;  Location: [facility];  Service: General;  Laterality: Right;   SPACE OAR INSTILLATION N/A 05/23/2021   Procedure: SPACE OAR INSTILLATION;  Surgeon: Ceasar Mons, MD;  Location: [facility];  Service: Urology;  Laterality: N/A;   TRANSRECTAL ULTRASOUND N/A 05/23/2021   Procedure: TRANSRECTAL ULTRASOUND;  Surgeon: Ceasar Mons, MD;  Location: [facility];  Service: Urology;   Laterality: N/A;    Social History:  reports that he has been smoking cigarettes. He has a 25.00 pack-year smoking history. He has never used smokeless tobacco. He reports that he does not currently use alcohol. He reports that he does not currently use drugs after having used the following drugs: Marijuana.  No Known Allergies  Family History  Problem Relation Age of Onset   Prostate cancer Cousin    Prostate cancer Cousin      Prior to Admission medications   Medication Sig Start Date End Date Taking? Authorizing Provider  albuterol (PROVENTIL) (2.5 MG/3ML) 0.083% nebulizer solution Take 3 mLs (2.5 mg total) by nebulization every 4 (four) hours as needed for wheezing or shortness of breath. 03/28/22  Yes Nani Gasser, MD  aspirin 81 MG chewable tablet Chew 1 tablet (81 mg total) by mouth daily. Patient taking differently: Chew 81 mg by mouth in the morning. 03/29/22  Yes Nani Gasser, MD  atenolol (TENORMIN) 25 MG tablet Take 12.5-25 mg by mouth See admin instructions. 12.5 mg every morning + 25 mg daily as needed for HR > 99.   Yes [provider]  B Complex-Biotin-FA TABS Take 1 tablet by mouth in the morning.   Yes [provider]  baclofen (LIORESAL) 10 MG tablet Take 10 mg by mouth 2 (two) times daily.   Yes [provider]  CALCIUM ALGINATE EX Apply 1 application  topically See admin instructions. Calcium Alginate-Silver external pad 4"x 5". Apply to left ankle and foot topically every day shift every other day for wound healing.   Yes [provider]  diphenhydrAMINE (BENADRYL) 25 MG tablet Take 25 mg by mouth See admin instructions. 25 mg at bedtime for 7 days (starting 06/27/22) + 25 mg daily as needed for allergic reaction   Yes [provider]  doxazosin (CARDURA) 1 MG tablet Take 1 mg by mouth in the morning.   Yes [provider]  Emollient (AQUAPHOR OINTMENT BODY EX) Apply 1 application  topically in the morning.  To lower legs and feet   Yes [provider]  gabapentin (NEURONTIN) 100 MG capsule Take 200 mg by mouth 2 (two) times daily.   Yes [provider]  hydrocortisone cream 1 % Apply 1 Application topically in the morning. To right arm   Yes [provider]  mirtazapine (REMERON) 7.5 MG tablet Take 7.5 mg by mouth at bedtime.   Yes [provider]  polyethylene glycol (MIRALAX / GLYCOLAX) 17 g packet Take 17 g by mouth daily as needed. 04/16/22  Yes Hosie Poisson, MD  rosuvastatin (CRESTOR) 20 MG tablet Take 1 tablet (20 mg total) by mouth daily. Patient taking differently: Take 10 mg by mouth every other day. (At bedtime) 03/29/22  Yes Nani Gasser, MD  senna-docusate (SENOKOT-S) 8.6-50 MG tablet Take 1 tablet by mouth at bedtime.   Yes [provider]  sodium chloride (OCEAN) 0.65 % SOLN nasal spray Place 1 spray into both nostrils as needed for congestion. 04/16/22  Yes Hosie Poisson, MD  amLODipine (NORVASC) 5 MG tablet Take 1  tablet (5 mg total) by mouth daily. Patient not taking: Reported on 06/28/2022 03/29/22   Nani Gasser, MD  feeding supplement (ENSURE ENLIVE / ENSURE PLUS) LIQD Take 237 mLs by mouth 2 (two) times daily between meals. Patient not taking: Reported on 06/28/2022 03/28/22   Nani Gasser, MD  food thickener (SIMPLYTHICK, NECTAR/LEVEL 2/MILDLY THICK,) GEL Take 1 packet by mouth as needed. Patient not taking: Reported on 05/01/2022 03/28/22   Nani Gasser, MD    Physical Exam: Vitals:   06/28/22 1342 06/28/22 1344 06/28/22 1447  BP: (!) 116/47    Pulse: 84    Resp: 15    Temp: (!) 94.7 F (34.8 C)  (!) 96.6 F (35.9 C)  TempSrc: Oral  Rectal  SpO2: 97%    Weight:  62.4 kg   Height:  '5\' 11"'$  (1.803 m)    Constitutional: Chronically ill-appearing man resting in bed.  NAD Eyes: EOMI, lids and conjunctivae normal ENMT: Mucous membranes are dry. Posterior pharynx clear of any exudate or lesions.Normal  dentition.  Neck: normal, supple, no masses. Respiratory: clear to auscultation bilaterally, no wheezing, no crackles. Normal respiratory effort. No accessory muscle use.  Cardiovascular: Regular rate and rhythm, no murmurs / rubs / gallops. No extremity edema. 2+ pedal pulses. Abdomen: no tenderness, no masses palpated. GU: Foley catheter in place, yellow urine in bag Musculoskeletal: no clubbing / cyanosis. No joint deformity upper and lower extremities.  Left hemiparesis. Skin: no ulcers. No induration Neurologic: Left hemiparesis and left facial droop, slightly dysarthric speech.  Strength intact right upper and lower extremity. Psychiatric:  Alert and oriented x 3.   EKG: Not performed.  Assessment/Plan Principal Problem:   Normocytic anemia Active Problems:   Primary hypertension   Dysphagia due to recent cerebrovascular accident (CVA)   History of CVA (cerebrovascular accident)   BPH (benign prostatic hyperplasia)   Jason Jones is a 72 y.o. 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  Assessment and Plan: Normocytic anemia: Hemoglobin 7.5 on admission compared to previous 11.4 04/17/2022.  No obvious bleeding source however suspicion is due to recent hematuria.  FOBT negative per EDP. -Transfusing 1 unit PRBC -Hold aspirin and other blood thinners for now -Anemia panel  Prostate cancer s/p beam radiation with hematuria  BPH with BOO and chronic indwelling Foley: Anemia suspected due to hematuria as above.  Foley catheter remains in place. -Continue Foley care -Continue doxazosin -Follow urine culture  History of CVA with residual left hemiparesis, oropharyngeal dysphagia, dysarthria: Chronic and stable. Continue baclofen,  gabapentin.  Dysphagia 1 diet.  Hypertension: Hold antihypertensives for now. Asthma: Stable, continue albuterol as needed.   DVT prophylaxis: SCDs Start: 06/28/22 2030 Code Status: Full code, confirmed with patient on admission Family Communication: None present on admission Disposition Plan: From facility and likely discharge to same facility pending clinical progress Consults called: None Severity of Illness: The appropriate patient status for this patient is OBSERVATION. Observation status is judged to be reasonable and necessary in order to provide the required intensity of service to ensure the patient's safety. The patient's presenting symptoms, physical exam findings, and initial radiographic and laboratory data in the context of their medical condition is felt to place them at decreased risk for further clinical deterioration. Furthermore, it is anticipated that the patient will be medically stable  for discharge from the hospital within 2 midnights of admission.   Zada Finders MD [facility]  If 7PM-7AM, please contact night-coverage [email or web address]  06/28/2022, 8:43 PM

## 2022-06-28 NOTE — ED Provider Notes (Signed)
Pt signed out by Dr. Rogene Houston pending labs.  Pt had labs drawn at the facility which showed anemia.  Pt was hypotensive for EMS, but has been normotensive here.    Pt is anemic with a hgb of 7.5 with last hgb 11.4 (04/17/22); plt slightly elevated at 516  Cmp with albumin low at 1.9; ast 53 and alt 64; ua with lg le, 21-50 rbcs and >50 wbcs.  Someone told Dr. Rogene Houston that pt was getting treated for e.coli in his urine with abx; however, there are no abx listed on his MAR.  Pt given rocephin in ED.  Urine sent for culture.   Pt's stool was yellow.   It looks like he had gross hematuria when he saw neurology on 11/22, so plavix and asa were stopped.  Urology did see pt in the hospital in November when foley was placed.  Bleeding thought to be due to friable post-radiation prostatic tissue.  No evidence of gross hematuria on exam today.  Etiology of rash is unclear.  Pt d/w Dr. Posey Pronto ([facility]) for admission.  CRITICAL CARE Performed by: Isla Pence   Total critical care time: 30 minutes  Critical care time was exclusive of separately billable procedures and treating other patients.  Critical care was necessary to treat or prevent imminent or life-threatening deterioration.  Critical care was time spent personally by me on the following activities: development of treatment plan with patient and/or surrogate as well as nursing, discussions with consultants, evaluation of patient's response to treatment, examination of patient, obtaining history from patient or surrogate, ordering and performing treatments and interventions, ordering and review of laboratory studies, ordering and review of radiographic studies, pulse oximetry and re-evaluation of patient's condition.      Isla Pence, MD 06/28/22 [phone]

## 2022-06-28 NOTE — ED Provider Notes (Addendum)
[facility] Provider Note   CSN: [id number] Arrival date & time: 06/28/22  1320     History  Chief Complaint  Patient presents with   Rash   Abnormal Lab    Jason Jones is a 72 y.o. male.  Patient brought in by MS from the facility.  Patient here for evaluation 2-week history of rash.  And apparently abnormal hemoglobin.  Patient without any specific complaints currently.  Denies any chest pain shortness of breath or abdominal pain.  Patient's blood pressures according to EMS were soft 80/60 facility documented 100/86.  We got 116/47 here.  Oxygen saturation 97% temp 94.7.  Respirations 15.  Past medical history significant for history of CVA in October with right-sided deficits and contractions.  Hypertension history of asthma chronic alcohol abuse.  Patient has long-term indwelling Foley catheter.  And recently diagnosed with E. coli in the urine.  Patient is on Crestor.  Patient's medications he is on Norvasc he is on Proventil does not appear to be on a blood thinner.       Home Medications Prior to Admission medications   Medication Sig Start Date End Date Taking? Authorizing Provider  albuterol (PROVENTIL) (2.5 MG/3ML) 0.083% nebulizer solution Take 3 mLs (2.5 mg total) by nebulization every 4 (four) hours as needed for wheezing or shortness of breath. 03/28/22   Nani Gasser, MD  amLODipine (NORVASC) 5 MG tablet Take 1 tablet (5 mg total) by mouth daily. 03/29/22   Nani Gasser, MD  aspirin 81 MG chewable tablet Chew 1 tablet (81 mg total) by mouth daily. 03/29/22   Nani Gasser, MD  feeding supplement (ENSURE ENLIVE / ENSURE PLUS) LIQD Take 237 mLs by mouth 2 (two) times daily between meals. Patient taking differently: Take 237 mLs by mouth 2 (two) times daily. 03/28/22   Nani Gasser, MD  food thickener (SIMPLYTHICK, NECTAR/LEVEL 2/MILDLY THICK,) GEL Take 1 packet by mouth as needed. Patient not taking:  Reported on 05/01/2022 03/28/22   Nani Gasser, MD  mirtazapine (REMERON) 7.5 MG tablet Take 7.5 mg by mouth at bedtime.    [provider]  polyethylene glycol (MIRALAX / GLYCOLAX) 17 g packet Take 17 g by mouth daily as needed. 04/16/22   Hosie Poisson, MD  rosuvastatin (CRESTOR) 20 MG tablet Take 1 tablet (20 mg total) by mouth daily. 03/29/22   Nani Gasser, MD  sennosides-docusate sodium (SENOKOT-S) 8.6-50 MG tablet Take 1 tablet by mouth at bedtime.    [provider]  sodium chloride (OCEAN) 0.65 % SOLN nasal spray Place 1 spray into both nostrils as needed for congestion. 04/16/22   Hosie Poisson, MD      Allergies    No known allergies    Review of Systems   Review of Systems  Constitutional:  Negative for chills and fever.  HENT:  Negative for ear pain and sore throat.   Eyes:  Negative for pain and visual disturbance.  Respiratory:  Negative for cough and shortness of breath.   Cardiovascular:  Negative for chest pain and palpitations.  Gastrointestinal:  Negative for abdominal pain and vomiting.  Genitourinary:  Negative for dysuria and hematuria.  Musculoskeletal:  Negative for arthralgias and back pain.  Skin:  Positive for rash. Negative for color change.  Neurological:  Positive for weakness. Negative for seizures and syncope.  All other systems reviewed and are negative.   Physical Exam Updated Vital Signs BP (!) 116/47 (BP Location: Left Arm)   Pulse  84   Temp (!) 94.7 F (34.8 C) (Oral)   Resp 15   Ht 1.803 m ('5\' 11"'$ )   Wt 62.4 kg   SpO2 97%   BMI 19.19 kg/m  Physical Exam Vitals and nursing note reviewed.  Constitutional:      General: He is not in acute distress.    Appearance: Normal appearance. He is well-developed. He is not ill-appearing.  HENT:     Head: Normocephalic and atraumatic.  Eyes:     Conjunctiva/sclera: Conjunctivae normal.     Pupils: Pupils are equal, round, and reactive to light.  Cardiovascular:      Rate and Rhythm: Normal rate and regular rhythm.     Heart sounds: No murmur heard. Pulmonary:     Effort: Pulmonary effort is normal. No respiratory distress.     Breath sounds: Normal breath sounds. No wheezing, rhonchi or rales.  Abdominal:     Palpations: Abdomen is soft.     Tenderness: There is no abdominal tenderness. There is no guarding.  Musculoskeletal:        General: No swelling.     Cervical back: Neck supple.  Skin:    General: Skin is warm and dry.     Capillary Refill: Capillary refill takes less than 2 seconds.     Findings: Rash present.     Comments: Nonblanching rash to both lower extremities.  Not petechiae.  Not vesicular.  Seems to be a little bit of hemorrhage under the skin.  Both lower extremities.  Up to the level of the knee.  Neurological:     Mental Status: He is alert. Mental status is at baseline.     Comments: Weakness left side status post CVA with some contractions.  Psychiatric:        Mood and Affect: Mood normal.     ED Results / Procedures / Treatments   Labs (all labs ordered are listed, but only abnormal results are displayed) Labs Reviewed  CBC WITH DIFFERENTIAL/PLATELET  COMPREHENSIVE METABOLIC PANEL  URINALYSIS, ROUTINE W REFLEX MICROSCOPIC    EKG None  Radiology No results found.  Procedures Procedures    Medications Ordered in ED Medications - No data to display  ED Course/ Medical Decision Making/ A&P                             Medical Decision Making Amount and/or Complexity of Data Reviewed Labs: ordered.  Patient with nonblanching rash to both lower extremities.  Does have a history of chronic alcohol abuse.  Will check CBC with differential patient reportedly has a low hemoglobin.  Will get complete metabolic panel to look at liver function test.  And will recheck his urine apparently does have a history of E. coli urinary tract infection.  Patient's vital signs here are reassuring other than a little bit of  hypothermia but patient is very alert and answering questions.  Will follow blood pressures since of blood pressures were documented to be 80/60 by EMS.  But blood pressure here is good.   Final Clinical Impression(s) / ED Diagnoses Final diagnoses:  Rash    Rx / DC Orders ED Discharge Orders     None         Fredia Sorrow, MD 06/28/22 1428    Fredia Sorrow, MD 06/28/22 1429

## 2022-06-28 NOTE — ED Notes (Signed)
ED TO INPATIENT HANDOFF REPORT  ED Nurse Name and Phone #:  [facility] Name/Age/Gender Jason Jones 72 y.o. male Room/Bed: H015C/H015C  Code Status   Code Status: Prior  Home/SNF/Other Nursing Home Patient oriented to: self, place, time, and situation Is this baseline? Yes   Triage Complete: Triage complete  Chief Complaint Normocytic anemia [D64.9]  Triage Note Contracted on left side hx stroke facility called ems for rash x 2 weeks to limbs and low hgb chronic foley recently dx with e coli in urine alert oriented no complaints for ems other than chronic pain pt did not know he was coming here and was cursing at EMS   80/60 with manual was agitated and ems not able to obtain vitals   100/86 documented last at facility    Allergies No Known Allergies  Level of Care/Admitting Diagnosis ED Disposition     ED Disposition  Admit   Condition  --   [place]: [place] [100100]  Level of Care: Med-Surg [16]  May place patient in observation at [facility] or [place] if equivalent level of care is available:: No  Covid Evaluation: Asymptomatic - no recent exposure (last 10 days) testing not required  Diagnosis: Normocytic anemia [208209]  Admitting Physician: Lenore Cordia [[id number]]  Attending Physician: Lenore Cordia [[id number]]          B Medical/Surgery History Past Medical History:  Diagnosis Date   Arthritis    Asthma    Cancer ([place])    Chronic alcohol abuse    Dyspnea    GERD (gastroesophageal reflux disease)    PRN  ---  TAKES BAKING SODA IN WATER   History of pneumothorax    04-28-2006  fell, left fx rib--  resolved w/ chest tube   Hypertension    Nocturia    Poor dental hygiene    Right inguinal hernia    Weak urinary stream    Past Surgical History:  Procedure Laterality Date   COLONOSCOPY  01/08/2021   GOLD SEED IMPLANT N/A 05/23/2021   Procedure: GOLD SEED IMPLANT;  Surgeon: Ceasar Mons, MD;  Location: [facility];  Service: Urology;  Laterality: N/A;   INGUINAL HERNIA REPAIR Right 04/23/2017   Procedure: OPEN RIGHT INGUINAL HERNIA REPAIR WITH MESH;  Surgeon: Kinsinger, Arta Bruce, MD;  Location: [place];  Service: General;  Laterality: Right;  GENERAL COMBINED WITH REGIONAL FOR POST OP PAIN    INSERTION OF MESH Right 04/23/2017   Procedure: INSERTION OF MESH;  Surgeon: Kinsinger, Arta Bruce, MD;  Location: [place];  Service: General;  Laterality: Right;  GENERAL COMBINED WITH REGIONAL FOR POST OP PAIN    LIPOMA EXCISION Right 02/22/2015   Procedure: RIGHT SHOULDER MASS EXCISION 15 CM SQ;  Surgeon: Mickeal Skinner, MD;  Location: [facility];  Service: General;  Laterality: Right;   SPACE OAR INSTILLATION N/A 05/23/2021   Procedure: SPACE OAR INSTILLATION;  Surgeon: Ceasar Mons, MD;  Location: [facility];  Service: Urology;  Laterality: N/A;   TRANSRECTAL ULTRASOUND N/A 05/23/2021   Procedure: TRANSRECTAL ULTRASOUND;  Surgeon: Ceasar Mons, MD;  Location: [facility];  Service: Urology;  Laterality: N/A;     A IV Location/Drains/Wounds Patient Lines/Drains/Airways Status     Active Line/Drains/Airways     Name Placement date Placement time Site Days   Peripheral IV 06/28/22 22 G Anterior;Proximal;Right Forearm 06/28/22  1751  Forearm  less  than 1   Urethral Catheter Lanell Matar, RN Coude 18 Fr. 04/12/22  1319  Coude  77   Airway 05/23/21  1334  -- 401   Incision (Closed) 02/22/15 Shoulder 02/22/15  1320  -- 2683   Incision (Closed) 04/23/17 Abdomen Right 04/23/17  1036  -- 1892            Intake/Output Last 24 hours No intake or output data in the 24 hours ending 06/28/22 1938  Labs/Imaging Results for orders placed or performed during the hospital encounter of 06/28/22 (from the past 48 hour(s))  Urinalysis, Routine w reflex microscopic     Status: Abnormal   Collection  Time: 06/28/22  3:15 PM  Result Value Ref Range   Color, Urine YELLOW YELLOW   APPearance CLOUDY (A) CLEAR   Specific Gravity, Urine 1.012 1.005 - 1.030   pH 5.0 5.0 - 8.0   Glucose, UA NEGATIVE NEGATIVE mg/dL   Hgb urine dipstick MODERATE (A) NEGATIVE   Bilirubin Urine NEGATIVE NEGATIVE   Ketones, ur NEGATIVE NEGATIVE mg/dL   Protein, ur 100 (A) NEGATIVE mg/dL   Nitrite NEGATIVE NEGATIVE   Leukocytes,Ua LARGE (A) NEGATIVE   RBC / HPF 21-50 0 - 5 RBC/hpf   WBC, UA >50 (H) 0 - 5 WBC/hpf   Bacteria, UA FEW (A) NONE SEEN   Squamous Epithelial / HPF 0-5 0 - 5 /HPF   WBC Clumps PRESENT    Mucus PRESENT     Comment: Performed at [facility], [address], [place], [place] [address]  CBC with Differential/Platelet     Status: Abnormal   Collection Time: 06/28/22  4:08 PM  Result Value Ref Range   WBC 9.8 4.0 - 10.5 K/uL   RBC 2.77 (L) 4.22 - 5.81 MIL/uL   Hemoglobin 7.5 (L) 13.0 - 17.0 g/dL   HCT 24.3 (L) 39.0 - 52.0 %   MCV 87.7 80.0 - 100.0 fL   MCH 27.1 26.0 - 34.0 pg   MCHC 30.9 30.0 - 36.0 g/dL   RDW 14.9 11.5 - 15.5 %   Platelets 516 (H) 150 - 400 K/uL   nRBC 0.0 0.0 - 0.2 %   Neutrophils Relative % 74 %   Neutro Abs 7.2 1.7 - 7.7 K/uL   Lymphocytes Relative 15 %   Lymphs Abs 1.5 0.7 - 4.0 K/uL   Monocytes Relative 5 %   Monocytes Absolute 0.5 0.1 - 1.0 K/uL   Eosinophils Relative 5 %   Eosinophils Absolute 0.5 0.0 - 0.5 K/uL   Basophils Relative 0 %   Basophils Absolute 0.0 0.0 - 0.1 K/uL   Immature Granulocytes 1 %   Abs Immature Granulocytes 0.10 (H) 0.00 - 0.07 K/uL    Comment: Performed at [facility] [address], [place], [place] [address]  Comprehensive metabolic panel     Status: Abnormal   Collection Time: 06/28/22  4:08 PM  Result Value Ref Range   Sodium 138 135 - 145 mmol/L   Potassium 4.5 3.5 - 5.1 mmol/L   Chloride 102 98 - 111 mmol/L   CO2 27 22 - 32 mmol/L   Glucose, Bld 107 (H) 70 - 99 mg/dL    Comment: Glucose reference range  applies only to samples taken after fasting for at least 8 hours.   BUN 38 (H) 8 - 23 mg/dL   Creatinine, Ser 1.15 0.61 - 1.24 mg/dL   Calcium 8.8 (L) 8.9 - 10.3 mg/dL   Total Protein  7.1 6.5 - 8.1 g/dL   Albumin 1.9 (L) 3.5 - 5.0 g/dL   AST 53 (H) 15 - 41 U/L   ALT 64 (H) 0 - 44 U/L   Alkaline Phosphatase 48 38 - 126 U/L   Total Bilirubin 0.3 0.3 - 1.2 mg/dL   GFR, Estimated >60 >60 mL/min    Comment: (NOTE) Calculated using the CKD-EPI Creatinine Equation (2021)    Anion gap 9 5 - 15    Comment: Performed at [facility] [address] [place], [place] [address]  Lactic acid, plasma     Status: None   Collection Time: 06/28/22  4:08 PM  Result Value Ref Range   Lactic Acid, Venous 1.5 0.5 - 1.9 mmol/L    Comment: Performed at [facility] [address], [place], [place] [address]  ABO/Rh     Status: None   Collection Time: 06/28/22  4:08 PM  Result Value Ref Range   ABO/RH(D)      A POS Performed at [facility] [address] [place], [place] [address]   Type and screen [place]     Status: None (Preliminary result)   Collection Time: 06/28/22  5:45 PM  Result Value Ref Range   ABO/RH(D) A POS    Antibody Screen NEG    Sample Expiration      07/01/2022,2359 Performed at [facility], [place] [address], [place], [place] [address]    Unit Number [id number]    Blood Component Type RED CELLS,LR    Unit division 00    Status of Unit ALLOCATED    Transfusion Status OK TO TRANSFUSE    Crossmatch Result Compatible   Prepare RBC (crossmatch)     Status: None   Collection Time: 06/28/22  6:07 PM  Result Value Ref Range   Order Confirmation      ORDER PROCESSED BY BLOOD BANK Performed at [facility], [place] [address] [place], [place] [address]    No results found.  Pending Labs Unresulted Labs (From admission, onward)     Start     Ordered   06/28/22 1541  Urine Culture  Once,   URGENT       Question:   Indication  Answer:  Suprapubic pain   06/28/22 1540   06/28/22 1541  Culture, blood (routine x 2)  BLOOD CULTURE X 2,   R      06/28/22 1540            Vitals/Pain Today's Vitals   06/28/22 1342 06/28/22 1344 06/28/22 1447  BP: (!) 116/47    Pulse: 84    Resp: 15    Temp: (!) 94.7 F (34.8 C)  (!) 96.6 F (35.9 C)  TempSrc: Oral  Rectal  SpO2: 97%    Weight:  62.4 kg   Height:  '5\' 11"'$  (1.803 m)   PainSc:  8      Isolation Precautions No active isolations  Medications Medications  0.9 %  sodium chloride infusion (Manually program via Guardrails IV Fluids) (has no administration in time range)  cefTRIAXone (ROCEPHIN) 1 g in sodium chloride 0.9 % 100 mL IVPB (1 g Intravenous New Bag/Given 06/28/22 1857)    Mobility non-ambulatory     Focused Assessments Neuro Assessment Handoff:  Swallow screen pass? Yes          Neuro Assessment:   Neuro Checks:      Has TPA been given?  No If patient is a Neuro Trauma and patient is going to OR before floor call report to [facility] nurse: [phone] or [phone]  ,    R Recommendations: See Admitting Provider Note  Report given to:   Additional Notes:

## 2022-06-28 NOTE — Hospital Course (Signed)
Jason Jones is a 72 y.o. 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

## 2022-06-29 DIAGNOSIS — D649 Anemia, unspecified: Secondary | ICD-10-CM | POA: Diagnosis not present

## 2022-06-29 LAB — COMPREHENSIVE METABOLIC PANEL
ALT: 62 U/L — ABNORMAL HIGH (ref 0–44)
AST: 50 U/L — ABNORMAL HIGH (ref 15–41)
Albumin: 1.9 g/dL — ABNORMAL LOW (ref 3.5–5.0)
Alkaline Phosphatase: 44 U/L (ref 38–126)
Anion gap: 8 (ref 5–15)
BUN: 33 mg/dL — ABNORMAL HIGH (ref 8–23)
CO2: 27 mmol/L (ref 22–32)
Calcium: 8.7 mg/dL — ABNORMAL LOW (ref 8.9–10.3)
Chloride: 102 mmol/L (ref 98–111)
Creatinine, Ser: 1.26 mg/dL — ABNORMAL HIGH (ref 0.61–1.24)
GFR, Estimated: >60 mL/min (ref >60)
Glucose, Bld: 103 mg/dL — ABNORMAL HIGH (ref 70–99)
Potassium: 4.3 mmol/L (ref 3.5–5.1)
Sodium: 137 mmol/L (ref 135–145)
Total Bilirubin: 0.3 mg/dL (ref 0.3–1.2)
Total Protein: 6.8 g/dL (ref 6.5–8.1)

## 2022-06-29 LAB — CBC
HCT: 23 % — ABNORMAL LOW (ref 39.0–52.0)
Hemoglobin: 7 g/dL — ABNORMAL LOW (ref 13.0–17.0)
MCH: 26.8 pg (ref 26.0–34.0)
MCHC: 30.4 g/dL (ref 30.0–36.0)
MCV: 88.1 fL (ref 80.0–100.0)
Platelets: 502 10*3/uL — ABNORMAL HIGH (ref 150–400)
RBC: 2.61 MIL/uL — ABNORMAL LOW (ref 4.22–5.81)
RDW: 15 % (ref 11.5–15.5)
WBC: 8.9 10*3/uL (ref 4.0–10.5)
nRBC: 0 % (ref 0.0–0.2)

## 2022-06-29 LAB — HEMOGLOBIN AND HEMATOCRIT, BLOOD
HCT: 26.7 % — ABNORMAL LOW (ref 39.0–52.0)
Hemoglobin: 8.7 g/dL — ABNORMAL LOW (ref 13.0–17.0)

## 2022-06-29 LAB — PREPARE RBC (CROSSMATCH)

## 2022-06-29 MED ORDER — CHLORHEXIDINE GLUCONATE CLOTH 2 % EX PADS
6.0000 | MEDICATED_PAD | Freq: Every day | CUTANEOUS | Status: DC
  Administered 2022-06-29 – 2022-06-30 (×2): 6 via TOPICAL

## 2022-06-29 MED ORDER — SODIUM CHLORIDE 0.9% IV SOLUTION: Freq: Once | INTRAVENOUS | Status: AC

## 2022-06-29 NOTE — Progress Notes (Signed)
PROGRESS NOTE    Jason Jones  SEG:[id number] DOB: 1951-06-05 DOA: 06/28/2022 PCP: Arthur Holms, NP   Brief Narrative:  HPI: Jason Jones is a 72 y.o. 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   Presenting from SNF for evaluation of anemia.  Reportedly he was also hypotensive at the facility and recently diagnosed with E. coli UTI.   Patient has a history of gross hematuria when admitted November 2023.  This was felt due to trauma in setting of friable postradiation prostatic tissue.  He has had a Foley catheter in place since that admission.   He was seen in urology office on follow-up 05/01/2022.  At that time due to persistent hematuria he was taken off of Plavix and recommended to continue on aspirin alone.   Patient currently denies any obvious bleeding including hematemesis, hemoptysis, hematochezia, melena, or hematuria.  He does report some lightheadedness.  He denies any chest pain, dyspnea, nausea, vomiting.   ED Course  Labs/Imaging on admission: I have personally reviewed following labs and imaging studies.   Initial vitals showed BP 116/47, pulse 84, RR 15, temp 94.7 F, SpO2 97% on room air.   Labs show hemoglobin 7.5 (11.4 on 04/17/2022), platelets 516,000, WBC 9.8, sodium 138, potassium 4.5, bicarb 27, BUN 38, creatinine 1.15, serum glucose 107, albumin 1.9, lactic acid 1.5.   Urinalysis shows negative nitrates, large leukocytes, 21-50 RBCs, >50 WBCs, few bacteria.  Urine culture ordered and pending.  Blood cultures in process.  FOBT negative per EDP.   Patient was given IV ceftriaxone 1 g in order to receive 1 unit PRBC transfusion.  The hospitalist service was consulted to admit for further evaluation  and management.    Assessment & Plan:   Principal Problem:   Normocytic anemia Active Problems:   Primary hypertension   Dysphagia due to recent cerebrovascular accident (CVA)   History of CVA (cerebrovascular accident)   BPH (benign prostatic hyperplasia)  Normocytic anemia: Hemoglobin 7.5 on admission compared to previous 11.4 04/17/2022.  No obvious bleeding source as patient denies any melena, hematemesis or hematochezia so less suspicious is likely for possible occult hematuria, FOBT is negative and ED.  Hemoglobin is positive in the UA.  I will recheck FOBT.  1 unit of PRBC transfusion was going to be ordered however we do not see any records of transfusion.  Hemoglobin dropped further to 7.0.  I will order 1 unit now and check H&H posttransfusion.  Holding aspirin for now.   Prostate cancer s/p beam radiation with hematuria  BPH with BOO and chronic indwelling Foley:  Foley catheter remains in place. -Continue Foley care -Continue doxazosin -Follow urine culture, UA not indicative of UTI as of now, no other features of infection.   History of CVA with residual left hemiparesis, oropharyngeal dysphagia, dysarthria: Chronic and stable. Continue baclofen, gabapentin.  Dysphagia 1 diet.   Hypertension: Hold antihypertensives for now.  Asthma: Stable, continue albuterol as needed.  DVT prophylaxis: SCDs Start: 06/28/22 2030   Code Status: Full Code  Family Communication: A friend present at bedside.  Plan of care discussed with patient in length and he/she verbalized understanding and agreed with it.  Status is: Observation The patient will require care spanning > 2 midnights and should be moved to inpatient because:  Patient required blood transfusion and monitoring for another 24 hours.   Estimated body mass index is 19.19 kg/m as calculated from the following:   Height as of this encounter: '5\' 11"'$  (1.803 m).   Weight as of this encounter: 62.4 kg.    Nutritional  Assessment: Body mass index is 19.19 kg/m.Marland Kitchen Seen by dietician.  I agree with the assessment and plan as outlined below: Nutrition Status:        . Skin Assessment: I have examined the patient's skin and I agree with the wound assessment as performed by the wound care RN as outlined below:    Consultants:  None  Procedures:  None  Antimicrobials:  Anti-infectives (From admission, onward)    Start     Dose/Rate Route Frequency Ordered Stop   06/28/22 1815  cefTRIAXone (ROCEPHIN) 1 g in sodium chloride 0.9 % 100 mL IVPB        1 g 200 mL/hr over 30 Minutes Intravenous  Once 06/28/22 1811 06/28/22 2029         Subjective: Patient seen and examined.  He has no complaints.  Friend at the bedside.  Objective: Vitals:   06/28/22 1447 06/28/22 2145 06/29/22 0543 06/29/22 0809  BP:  (!) 92/52 104/75 107/63  Pulse:  88 62 93  Resp:  '20 17 18  '$ Temp: (!) 96.6 F (35.9 C) 98.7 F (37.1 C) 98.7 F (37.1 C) (!) 97.5 F (36.4 C)  TempSrc: Rectal Oral Oral Oral  SpO2:  100% 99% 99%  Weight:      Height:        Intake/Output Summary (Last 24 hours) at 06/29/2022 1108 Last data filed at 06/29/2022 0919 Gross per 24 hour  Intake --  Output 1200 ml  Net -1200 ml   Filed Weights   06/28/22 1344  Weight: 62.4 kg    Examination:  General exam: Appears calm and comfortable  Respiratory system: Clear to auscultation. Respiratory effort normal. Cardiovascular system: S1 & S2 heard, RRR. No JVD, murmurs, rubs, gallops or clicks. No pedal edema. Gastrointestinal system: Abdomen is nondistended, soft and nontender. No organomegaly or masses felt. Normal bowel sounds heard. Central nervous system: Alert and oriented.  Left hemiplegia. Extremities: Symmetric 5 x 5 power. Skin: No rashes, lesions or ulcers   Data Reviewed: I have personally reviewed following labs and imaging studies  CBC: Recent Labs  Lab 06/28/22 1608 06/29/22 0756  WBC 9.8 8.9  NEUTROABS 7.2  --    HGB 7.5* 7.0*  HCT 24.3* 23.0*  MCV 87.7 88.1  PLT 516* 998*   Basic Metabolic Panel: Recent Labs  Lab 06/28/22 1608 06/29/22 0756  NA 138 137  K 4.5 4.3  CL 102 102  CO2 27 27  GLUCOSE 107* 103*  BUN 38* 33*  CREATININE 1.15 1.26*  CALCIUM 8.8* 8.7*   GFR: Estimated Creatinine Clearance: 46.8 mL/min (A) (by C-G formula based on SCr of 1.26 mg/dL (H)). Liver Function Tests: Recent Labs  Lab 06/28/22 1608 06/29/22 0756  AST 53* 50*  ALT 64* 62*  ALKPHOS 48 44  BILITOT 0.3 0.3  PROT 7.1 6.8  ALBUMIN 1.9* 1.9*   No results for input(s): "LIPASE", "AMYLASE" in the last 168 hours. No results for input(s): "AMMONIA" in the last 168 hours. Coagulation Profile: No results for input(s): "INR", "PROTIME" in the last 168 hours. Cardiac Enzymes: No results for input(s): "CKTOTAL", "CKMB", "CKMBINDEX", "TROPONINI" in the last 168 hours. BNP (last 3 results) No results for input(s): "PROBNP"  in the last 8760 hours. HbA1C: No results for input(s): "HGBA1C" in the last 72 hours. CBG: No results for input(s): "GLUCAP" in the last 168 hours. Lipid Profile: No results for input(s): "CHOL", "HDL", "LDLCALC", "TRIG", "CHOLHDL", "LDLDIRECT" in the last 72 hours. Thyroid Function Tests: No results for input(s): "TSH", "T4TOTAL", "FREET4", "T3FREE", "THYROIDAB" in the last 72 hours. Anemia Panel: Recent Labs    06/28/22 2122  VITAMINB12 678  FOLATE 9.9  FERRITIN 604*  TIBC 186*  IRON 30*   Sepsis Labs: Recent Labs  Lab 06/28/22 1608  LATICACIDVEN 1.5    Recent Results (from the past 240 hour(s))  Culture, blood (routine x 2)     Status: None (Preliminary result)   Collection Time: 06/28/22  4:08 PM   Specimen: BLOOD  Result Value Ref Range Status   Specimen Description BLOOD SITE NOT SPECIFIED  Final   Special Requests   Final    BOTTLES DRAWN AEROBIC AND ANAEROBIC Blood Culture results may not be optimal due to an inadequate volume of blood received in culture  bottles   Culture   Final    NO GROWTH < 24 HOURS Performed at [facility], [place] [address] [place], [place] [address]    Report Status PENDING  Incomplete  Culture, blood (routine x 2)     Status: None (Preliminary result)   Collection Time: 06/28/22  5:45 PM   Specimen: BLOOD  Result Value Ref Range Status   Specimen Description BLOOD SITE NOT SPECIFIED  Final   Special Requests   Final    BOTTLES DRAWN AEROBIC AND ANAEROBIC Blood Culture adequate volume   Culture   Final    NO GROWTH < 24 HOURS Performed at [facility], [place] [address], [place], [place] [address]    Report Status PENDING  Incomplete     Radiology Studies: No results found.  Scheduled Meds:  sodium chloride   Intravenous Once   sodium chloride   Intravenous Once   baclofen  10 mg Oral BID   Chlorhexidine Gluconate Cloth  6 each Topical Daily   doxazosin  1 mg Oral q AM   gabapentin  200 mg Oral BID   mirtazapine  7.5 mg Oral QHS   rosuvastatin  10 mg Oral QHS   senna-docusate  1 tablet Oral QHS   Continuous Infusions:   LOS: 0 days   Darliss Cheney, MD [facility]  06/29/2022, 11:08 AM   *Please note that this is a verbal dictation therefore any spelling or grammatical errors are due to the "[place] [facility]" system interpretation.  Please page via [place] and do not message via secure chat for urgent patient care matters. Secure chat can be used for non urgent patient care matters.  How to contact the [facility] Attending or Consulting provider [place] or covering provider during after hours [place], for this patient?  Check the care team in [facility] and look for a) attending/consulting [facility] provider listed and b) the [facility] team listed. Page or secure chat [phone]. Log into [email or web address] and use [place]'s universal password to access. If you do not have the password, please contact the hospital operator. Locate the [facility] provider you are looking for under [facility] and page to a number  that you can be directly reached. If you still have difficulty reaching the provider, please page the [facility] (Director on Call) for the Hospitalists listed on amion for assistance.

## 2022-06-30 DIAGNOSIS — D649 Anemia, unspecified: Secondary | ICD-10-CM | POA: Diagnosis not present

## 2022-06-30 LAB — TYPE AND SCREEN
ABO/RH(D): A POS
Antibody Screen: NEGATIVE
Unit division: 0

## 2022-06-30 LAB — CBC WITH DIFFERENTIAL/PLATELET
Abs Immature Granulocytes: 0.06 K/uL (ref 0.00–0.07)
Basophils Absolute: 0 K/uL (ref 0.0–0.1)
Basophils Relative: 0 %
Eosinophils Absolute: 0.3 K/uL (ref 0.0–0.5)
Eosinophils Relative: 3 %
HCT: 27.3 % — ABNORMAL LOW (ref 39.0–52.0)
Hemoglobin: 8.7 g/dL — ABNORMAL LOW (ref 13.0–17.0)
Immature Granulocytes: 1 %
Lymphocytes Relative: 13 %
Lymphs Abs: 1.3 K/uL (ref 0.7–4.0)
MCH: 27.4 pg (ref 26.0–34.0)
MCHC: 31.9 g/dL (ref 30.0–36.0)
MCV: 86.1 fL (ref 80.0–100.0)
Monocytes Absolute: 0.5 K/uL (ref 0.1–1.0)
Monocytes Relative: 6 %
Neutro Abs: 7.5 K/uL (ref 1.7–7.7)
Neutrophils Relative %: 77 %
Platelets: 500 K/uL — ABNORMAL HIGH (ref 150–400)
RBC: 3.17 MIL/uL — ABNORMAL LOW (ref 4.22–5.81)
RDW: 15.5 % (ref 11.5–15.5)
WBC: 9.8 K/uL (ref 4.0–10.5)
nRBC: 0 % (ref 0.0–0.2)

## 2022-06-30 LAB — BPAM RBC
Blood Product Expiration Date: 202402142359
ISSUE DATE / TIME: 202401201121
Unit Type and Rh: 6200

## 2022-06-30 LAB — BASIC METABOLIC PANEL WITH GFR
Anion gap: 7 (ref 5–15)
BUN: 31 mg/dL — ABNORMAL HIGH (ref 8–23)
CO2: 26 mmol/L (ref 22–32)
Calcium: 8.7 mg/dL — ABNORMAL LOW (ref 8.9–10.3)
Chloride: 105 mmol/L (ref 98–111)
Creatinine, Ser: 1.15 mg/dL (ref 0.61–1.24)
GFR, Estimated: >60 mL/min (ref >60)
Glucose, Bld: 101 mg/dL — ABNORMAL HIGH (ref 70–99)
Potassium: 4.1 mmol/L (ref 3.5–5.1)
Sodium: 138 mmol/L (ref 135–145)

## 2022-06-30 LAB — URINE CULTURE: Culture: NO GROWTH

## 2022-06-30 NOTE — Discharge Summary (Signed)
Physician Discharge Summary  Jason Jones [id number] DOB: 1951/01/23 DOA: 06/28/2022  PCP: Arthur Holms, NP  Admit date: 06/28/2022 Discharge date: 06/30/2022    Admitted From: SNF Disposition: SNF  Recommendations for Outpatient Follow-up:  Follow up with PCP in 1-2 weeks Please obtain BMP/CBC in one week Please follow up with your PCP on the following pending results: Unresulted Labs (From admission, onward)     Start     Ordered   06/29/22 1229  MRSA Next Gen by PCR, Nasal  Once,   R        06/29/22 1228   06/29/22 1110  Occult blood card to lab, stool  Once,   R        06/29/22 1109   06/28/22 2021  Urine Culture  Add-on,   AD       Question:  Indication  Answer:  Suprapubic pain   06/28/22 2020              Home Health: None Equipment/Devices: None  Discharge Condition: Stable CODE STATUS: Full code Diet recommendation: Cardiac  HPI: Jason Jones is a 72 y.o. 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   Presenting from SNF for evaluation of anemia.  Reportedly he was also hypotensive at the facility and recently diagnosed with E. coli UTI.   Patient has a history of gross hematuria when admitted November 2023.  This was felt due to trauma in setting of friable postradiation prostatic tissue.  He has had a Foley catheter in place since that admission.   He was seen in urology office on follow-up 05/01/2022.  At that time due to persistent hematuria he was taken off of Plavix and recommended to continue on aspirin alone.   Patient currently denies any obvious bleeding including hematemesis, hemoptysis, hematochezia, melena, or hematuria.  He does report some lightheadedness.  He denies any chest  pain, dyspnea, nausea, vomiting.   ED Course  Labs/Imaging on admission: I have personally reviewed following labs and imaging studies.   Initial vitals showed BP 116/47, pulse 84, RR 15, temp 94.7 F, SpO2 97% on room air.   Labs show hemoglobin 7.5 (11.4 on 04/17/2022), platelets 516,000, WBC 9.8, sodium 138, potassium 4.5, bicarb 27, BUN 38, creatinine 1.15, serum glucose 107, albumin 1.9, lactic acid 1.5.   Urinalysis shows negative nitrates, large leukocytes, 21-50 RBCs, >50 WBCs, few bacteria.  Urine culture ordered and pending.  Blood cultures in process.  FOBT negative per EDP.   Patient was given IV ceftriaxone 1 g in order to receive 1 unit PRBC transfusion.  The hospitalist service was consulted to admit for further evaluation and management.  Subjective: Seen and examined, he feels better and stronger and he is feeling comfortable going back to SNF today.  Brief/Interim Summary: Patient was admitted under hospital service for anemia.  Details below.  Normocytic anemia: Hemoglobin 7.5 on admission compared to previous 11.4 04/17/2022.  No obvious bleeding source as patient denies any melena, hematemesis or hematochezia so less suspicious is likely for possible occult hematuria, FOBT is negative. Hemoglobin is positive in the UA.  He was given 1 unit of PRBC transfusion, hemoglobin over 8 today.  He is feeling better.  He is being discharged back to SNF today.   Prostate cancer s/p  beam radiation with hematuria  BPH with BOO and chronic indwelling Foley:  Foley catheter remains in place. -Continue Foley care -Continue doxazosin -Follow urine culture, UA not indicative of UTI as of now, no other features of infection.   History of CVA with residual left hemiparesis, oropharyngeal dysphagia, dysarthria: Chronic and stable. Continue baclofen, gabapentin.  Dysphagia 1 diet.   Hypertension: Resume home medications.   Asthma: Stable, continue albuterol as needed.  Discharge plan  was discussed with patient and/or family member and they verbalized understanding and agreed with it.  Discharge Diagnoses:  Principal Problem:   Normocytic anemia Active Problems:   Primary hypertension   Dysphagia due to recent cerebrovascular accident (CVA)   History of CVA (cerebrovascular accident)   BPH (benign prostatic hyperplasia)    Discharge Instructions   Allergies as of 06/30/2022   No Known Allergies      Medication List     TAKE these medications    albuterol (2.5 MG/3ML) 0.083% nebulizer solution Commonly known as: PROVENTIL Take 3 mLs (2.5 mg total) by nebulization every 4 (four) hours as needed for wheezing or shortness of breath.   amLODipine 5 MG tablet Commonly known as: NORVASC Take 1 tablet (5 mg total) by mouth daily.   AQUAPHOR OINTMENT BODY EX Apply 1 application  topically in the morning. To lower legs and feet   aspirin 81 MG chewable tablet Chew 1 tablet (81 mg total) by mouth daily. What changed: when to take this   atenolol 25 MG tablet Commonly known as: TENORMIN Take 12.5-25 mg by mouth See admin instructions. 12.5 mg every morning + 25 mg daily as needed for HR > 99.   B Complex-Biotin-FA Tabs Take 1 tablet by mouth in the morning.   baclofen 10 MG tablet Commonly known as: LIORESAL Take 10 mg by mouth 2 (two) times daily.   CALCIUM ALGINATE EX Apply 1 application  topically See admin instructions. Calcium Alginate-Silver external pad 4"x 5". Apply to left ankle and foot topically every day shift every other day for wound healing.   diphenhydrAMINE 25 MG tablet Commonly known as: BENADRYL Take 25 mg by mouth See admin instructions. 25 mg at bedtime for 7 days (starting 06/27/22) + 25 mg daily as needed for allergic reaction   doxazosin 1 MG tablet Commonly known as: CARDURA Take 1 mg by mouth in the morning.   feeding supplement Liqd Take 237 mLs by mouth 2 (two) times daily between meals.   food thickener Gel Commonly  known as: SIMPLYTHICK (NECTAR/LEVEL 2/MILDLY THICK) Take 1 packet by mouth as needed.   gabapentin 100 MG capsule Commonly known as: NEURONTIN Take 200 mg by mouth 2 (two) times daily.   hydrocortisone cream 1 % Apply 1 Application topically in the morning. To right arm   mirtazapine 7.5 MG tablet Commonly known as: REMERON Take 7.5 mg by mouth at bedtime.   polyethylene glycol 17 g packet Commonly known as: MIRALAX / GLYCOLAX Take 17 g by mouth daily as needed.   rosuvastatin 20 MG tablet Commonly known as: CRESTOR Take 1 tablet (20 mg total) by mouth daily. What changed:  how much to take when to take this additional instructions   senna-docusate 8.6-50 MG tablet Commonly known as: Senokot-S Take 1 tablet by mouth at bedtime.   sodium chloride 0.65 % Soln nasal spray Commonly known as: OCEAN Place 1 spray into both nostrils as needed for congestion.        Contact information for follow-up  providers     Arthur Holms, NP Follow up in 1 week(s).   Specialty: Nurse Practitioner Contact information: [place] [address] [phone]              Contact information for after-discharge care     Destination     [facility] .   Service: Skilled Nursing Contact information: [address] [place] [phone]                    No Known Allergies  Consultations: None   Procedures/Studies: No results found.   Discharge Exam: Vitals:   06/30/22 0523 06/30/22 0831  BP: 97/61 101/62  Pulse: 70 84  Resp: 16 18  Temp: 98 F (36.7 C) 98.1 F (36.7 C)  SpO2: 98% 98%   Vitals:   06/29/22 2108 06/29/22 2110 06/30/22 0523 06/30/22 0831  BP: 102/66 100/64 97/61 101/62  Pulse: 88  70 84  Resp: '16  16 18  '$ Temp: 98.5 F (36.9 C)  98 F (36.7 C) 98.1 F (36.7 C)  TempSrc: Oral  Oral Oral  SpO2: 98%  98% 98%  Weight:      Height:        General: Pt is alert, awake, not in acute  distress Cardiovascular: RRR, S1/S2 +, no rubs, no gallops Respiratory: CTA bilaterally, no wheezing, no rhonchi Abdominal: Soft, NT, ND, bowel sounds + Extremities: no edema, no cyanosis Neuro: Alert and oriented.  Left hemiplegia.   The results of significant diagnostics from this hospitalization (including imaging, microbiology, ancillary and laboratory) are listed below for reference.     Microbiology: Recent Results (from the past 240 hour(s))  Culture, blood (routine x 2)     Status: None (Preliminary result)   Collection Time: 06/28/22  4:08 PM   Specimen: BLOOD  Result Value Ref Range Status   Specimen Description BLOOD SITE NOT SPECIFIED  Final   Special Requests   Final    BOTTLES DRAWN AEROBIC AND ANAEROBIC Blood Culture results may not be optimal due to an inadequate volume of blood received in culture bottles   Culture   Final    NO GROWTH 2 DAYS Performed at [facility], [place] [address], [place], [place] [address]    Report Status PENDING  Incomplete  Culture, blood (routine x 2)     Status: None (Preliminary result)   Collection Time: 06/28/22  5:45 PM   Specimen: BLOOD  Result Value Ref Range Status   Specimen Description BLOOD SITE NOT SPECIFIED  Final   Special Requests   Final    BOTTLES DRAWN AEROBIC AND ANAEROBIC Blood Culture adequate volume   Culture   Final    NO GROWTH 2 DAYS Performed at [facility], [address] [place], [place] [address]    Report Status PENDING  Incomplete     Labs: BNP (last 3 results) No results for input(s): "BNP" in the last 8760 hours. Basic Metabolic Panel: Recent Labs  Lab 06/28/22 1608 06/29/22 0756 06/30/22 0932  NA 138 137 138  K 4.5 4.3 4.1  CL 102 102 105  CO2 '27 27 26  '$ GLUCOSE 107* 103* 101*  BUN 38* 33* 31*  CREATININE 1.15 1.26* 1.15  CALCIUM 8.8* 8.7* 8.7*   Liver Function Tests: Recent Labs  Lab 06/28/22 1608 06/29/22 0756  AST 53* 50*  ALT 64* 62*  ALKPHOS 48 44  BILITOT  0.3 0.3  PROT 7.1 6.8  ALBUMIN 1.9* 1.9*   No results for input(s): "LIPASE", "AMYLASE" in the last 168 hours. No results for input(s): "AMMONIA" in the last 168 hours. CBC: Recent Labs  Lab 06/28/22 1608 06/29/22 0756 06/29/22 2045 06/30/22 0932  WBC 9.8 8.9  --  9.8  NEUTROABS 7.2  --   --  7.5  HGB 7.5* 7.0* 8.7* 8.7*  HCT 24.3* 23.0* 26.7* 27.3*  MCV 87.7 88.1  --  86.1  PLT 516* 502*  --  500*   Cardiac Enzymes: No results for input(s): "CKTOTAL", "CKMB", "CKMBINDEX", "TROPONINI" in the last 168 hours. BNP: Invalid input(s): "POCBNP" CBG: No results for input(s): "GLUCAP" in the last 168 hours. D-Dimer No results for input(s): "DDIMER" in the last 72 hours. Hgb A1c No results for input(s): "HGBA1C" in the last 72 hours. Lipid Profile No results for input(s): "CHOL", "HDL", "LDLCALC", "TRIG", "CHOLHDL", "LDLDIRECT" in the last 72 hours. Thyroid function studies No results for input(s): "TSH", "T4TOTAL", "T3FREE", "THYROIDAB" in the last 72 hours.  Invalid input(s): "FREET3" Anemia work up Recent Labs    06/28/22 2122  VITAMINB12 678  FOLATE 9.9  FERRITIN 604*  TIBC 186*  IRON 30*   Urinalysis    Component Value Date/Time   COLORURINE YELLOW 06/28/2022 1515   APPEARANCEUR CLOUDY (A) 06/28/2022 1515   LABSPEC 1.012 06/28/2022 1515   PHURINE 5.0 06/28/2022 1515   GLUCOSEU NEGATIVE 06/28/2022 1515   HGBUR MODERATE (A) 06/28/2022 1515   BILIRUBINUR NEGATIVE 06/28/2022 1515   KETONESUR NEGATIVE 06/28/2022 1515   PROTEINUR 100 (A) 06/28/2022 1515   UROBILINOGEN 0.2 03/16/2008 0733   NITRITE NEGATIVE 06/28/2022 1515   LEUKOCYTESUR LARGE (A) 06/28/2022 1515   Sepsis Labs Recent Labs  Lab 06/28/22 1608 06/29/22 0756 06/30/22 0932  WBC 9.8 8.9 9.8   Microbiology Recent Results (from the past 240 hour(s))  Culture, blood (routine x 2)     Status: None (Preliminary result)   Collection Time: 06/28/22  4:08 PM   Specimen: BLOOD  Result Value Ref Range  Status   Specimen Description BLOOD SITE NOT SPECIFIED  Final   Special Requests   Final    BOTTLES DRAWN AEROBIC AND ANAEROBIC Blood Culture results may not be optimal due to an inadequate volume of blood received in culture bottles   Culture   Final    NO GROWTH 2 DAYS Performed at [facility], [place] [address] [place], [place] [address]    Report Status PENDING  Incomplete  Culture, blood (routine x 2)     Status: None (Preliminary result)   Collection Time: 06/28/22  5:45 PM   Specimen: BLOOD  Result Value Ref Range Status   Specimen Description BLOOD SITE NOT SPECIFIED  Final   Special Requests   Final    BOTTLES DRAWN AEROBIC AND ANAEROBIC Blood Culture adequate volume   Culture   Final    NO GROWTH 2 DAYS Performed at [facility], [address], [place], [place] [address]    Report Status PENDING  Incomplete     Time coordinating discharge: Over 30 minutes  SIGNED:   Darliss Cheney, MD  [facility] 06/30/2022, 10:56 AM *Please note that this is a verbal dictation therefore any spelling or grammatical errors are due to the "[employer]" system interpretation. If 7PM-7AM, please contact night-coverage [email or web address]

## 2022-06-30 NOTE — Progress Notes (Signed)
Pt admitted from [facility] where he is a LTC/SNF resident. Per MD, pt ready for dc today. Confirmed with Albina Billet in admissions pt is able to return today. MD updated.   Wandra Feinstein, MSW, LCSW [phone] (coverage)

## 2022-06-30 NOTE — TOC Transition Note (Signed)
Transition of Care [facility]) - CM/SW Discharge Note   Patient Details  Name: Jason Jones MRN: [id number] Date of Birth: [birth year]  Transition of [facility] CM/SW Contact:  Amador Cunas, [place] Phone Number: 06/30/2022, 11:13 AM   Clinical Narrative: Pt for dc back to [facility] today where he is a LTC resident. Confirmed with Albina Billet in admissions pt will return to the [facility]. Pt's dtr aware of dc and requesting MD call her with update, MD made aware. RN provided with number for report and [facility]AR arranged for transport. SW signing off at dc.   Wandra Feinstein, MSW, LCSW [phone] (coverage)        Final next level of care: [facility] Barriers to Discharge: No Barriers Identified   Patient Goals and CMS Choice      Discharge Placement                Patient chooses bed at: [facility] Patient to be transferred to facility by: [facility] Name of family member notified: Tanya/dtr Patient and family notified of of transfer: 06/30/22  Discharge Plan and Services Additional resources added to the After Visit Summary for                                       Social Determinants of Health (SDOH) Interventions SDOH Screenings   Food Insecurity: No Food Insecurity (06/28/2022)  Housing: Low Risk  (06/28/2022)  Transportation Needs: No Transportation Needs (06/28/2022)  Utilities: Not At Risk (06/28/2022)  Tobacco Use: High Risk (06/28/2022)     Readmission Risk Interventions     No data to display

## 2022-06-30 NOTE — Progress Notes (Signed)
Patient being discharged to [facility] today. Report given to [facility] LPN @ 03:79 a.m. I was told to give report to [facility] but Theodoro Grist was unavailable for the rest of the day.

## 2022-07-01 LAB — OCCULT BLOOD, POC DEVICE: Fecal Occult Bld: NEGATIVE

## 2022-07-03 LAB — CULTURE, BLOOD (ROUTINE X 2)
Culture: NO GROWTH
Culture: NO GROWTH
Special Requests: ADEQUATE

## 2022-07-20 ENCOUNTER — Encounter (HOSPITAL_COMMUNITY): Payer: Self-pay

## 2022-07-20 ENCOUNTER — Emergency Department (HOSPITAL_COMMUNITY): Payer: 59

## 2022-07-20 ENCOUNTER — Inpatient Hospital Stay (HOSPITAL_COMMUNITY)
Admission: EM | Admit: 2022-07-20 | Discharge: 2022-07-24 | DRG: 698 | Disposition: A | Discharge status: Skilled Nursing Facility | Payer: 59 | Source: Skilled Nursing Facility | Attending: Internal Medicine | Admitting: Internal Medicine

## 2022-07-20 ENCOUNTER — Other Ambulatory Visit: Payer: Self-pay

## 2022-07-20 DIAGNOSIS — C61 Malignant neoplasm of prostate: Secondary | ICD-10-CM | POA: Diagnosis not present

## 2022-07-20 DIAGNOSIS — F0150 Vascular dementia without behavioral disturbance: Secondary | ICD-10-CM | POA: Diagnosis present

## 2022-07-20 DIAGNOSIS — K625 Hemorrhage of anus and rectum: Secondary | ICD-10-CM | POA: Diagnosis present

## 2022-07-20 DIAGNOSIS — B9562 Methicillin resistant Staphylococcus aureus infection as the cause of diseases classified elsewhere: Secondary | ICD-10-CM | POA: Diagnosis present

## 2022-07-20 DIAGNOSIS — Y92239 Unspecified place in hospital as the place of occurrence of the external cause: Secondary | ICD-10-CM | POA: Diagnosis not present

## 2022-07-20 DIAGNOSIS — Z8042 Family history of malignant neoplasm of prostate: Secondary | ICD-10-CM

## 2022-07-20 DIAGNOSIS — N179 Acute kidney failure, unspecified: Secondary | ICD-10-CM | POA: Diagnosis present

## 2022-07-20 DIAGNOSIS — E785 Hyperlipidemia, unspecified: Secondary | ICD-10-CM | POA: Diagnosis present

## 2022-07-20 DIAGNOSIS — F17210 Nicotine dependence, cigarettes, uncomplicated: Secondary | ICD-10-CM | POA: Diagnosis present

## 2022-07-20 DIAGNOSIS — T361X5A Adverse effect of cephalosporins and other beta-lactam antibiotics, initial encounter: Secondary | ICD-10-CM | POA: Diagnosis not present

## 2022-07-20 DIAGNOSIS — K5730 Diverticulosis of large intestine without perforation or abscess without bleeding: Secondary | ICD-10-CM | POA: Insufficient documentation

## 2022-07-20 DIAGNOSIS — N400 Enlarged prostate without lower urinary tract symptoms: Secondary | ICD-10-CM | POA: Diagnosis present

## 2022-07-20 DIAGNOSIS — Z1152 Encounter for screening for COVID-19: Secondary | ICD-10-CM

## 2022-07-20 DIAGNOSIS — Z7189 Other specified counseling: Secondary | ICD-10-CM | POA: Diagnosis not present

## 2022-07-20 DIAGNOSIS — E876 Hypokalemia: Secondary | ICD-10-CM | POA: Diagnosis present

## 2022-07-20 DIAGNOSIS — T83511A Infection and inflammatory reaction due to indwelling urethral catheter, initial encounter: Secondary | ICD-10-CM | POA: Diagnosis present

## 2022-07-20 DIAGNOSIS — I10 Essential (primary) hypertension: Secondary | ICD-10-CM | POA: Diagnosis present

## 2022-07-20 DIAGNOSIS — F1010 Alcohol abuse, uncomplicated: Secondary | ICD-10-CM | POA: Diagnosis present

## 2022-07-20 DIAGNOSIS — J90 Pleural effusion, not elsewhere classified: Secondary | ICD-10-CM | POA: Diagnosis present

## 2022-07-20 DIAGNOSIS — R319 Hematuria, unspecified: Secondary | ICD-10-CM | POA: Diagnosis present

## 2022-07-20 DIAGNOSIS — J189 Pneumonia, unspecified organism: Secondary | ICD-10-CM

## 2022-07-20 DIAGNOSIS — L89523 Pressure ulcer of left ankle, stage 3: Secondary | ICD-10-CM | POA: Diagnosis present

## 2022-07-20 DIAGNOSIS — F01511 Vascular dementia, unspecified severity, with agitation: Secondary | ICD-10-CM | POA: Diagnosis present

## 2022-07-20 DIAGNOSIS — F01C18 Vascular dementia, severe, with other behavioral disturbance: Secondary | ICD-10-CM | POA: Diagnosis not present

## 2022-07-20 DIAGNOSIS — Z888 Allergy status to other drugs, medicaments and biological substances status: Secondary | ICD-10-CM | POA: Diagnosis not present

## 2022-07-20 DIAGNOSIS — D62 Acute posthemorrhagic anemia: Secondary | ICD-10-CM | POA: Diagnosis present

## 2022-07-20 DIAGNOSIS — Y738 Miscellaneous gastroenterology and urology devices associated with adverse incidents, not elsewhere classified: Secondary | ICD-10-CM | POA: Diagnosis present

## 2022-07-20 DIAGNOSIS — N390 Urinary tract infection, site not specified: Secondary | ICD-10-CM | POA: Diagnosis present

## 2022-07-20 DIAGNOSIS — K219 Gastro-esophageal reflux disease without esophagitis: Secondary | ICD-10-CM | POA: Diagnosis present

## 2022-07-20 DIAGNOSIS — K921 Melena: Secondary | ICD-10-CM | POA: Diagnosis present

## 2022-07-20 DIAGNOSIS — Z681 Body mass index (BMI) 19 or less, adult: Secondary | ICD-10-CM | POA: Diagnosis not present

## 2022-07-20 DIAGNOSIS — B182 Chronic viral hepatitis C: Secondary | ICD-10-CM | POA: Insufficient documentation

## 2022-07-20 DIAGNOSIS — E43 Unspecified severe protein-calorie malnutrition: Secondary | ICD-10-CM | POA: Diagnosis present

## 2022-07-20 DIAGNOSIS — I69354 Hemiplegia and hemiparesis following cerebral infarction affecting left non-dominant side: Secondary | ICD-10-CM

## 2022-07-20 DIAGNOSIS — Z7982 Long term (current) use of aspirin: Secondary | ICD-10-CM

## 2022-07-20 DIAGNOSIS — N138 Other obstructive and reflux uropathy: Secondary | ICD-10-CM | POA: Diagnosis present

## 2022-07-20 DIAGNOSIS — Z8546 Personal history of malignant neoplasm of prostate: Secondary | ICD-10-CM

## 2022-07-20 DIAGNOSIS — D649 Anemia, unspecified: Secondary | ICD-10-CM | POA: Diagnosis present

## 2022-07-20 DIAGNOSIS — Z8673 Personal history of transient ischemic attack (TIA), and cerebral infarction without residual deficits: Secondary | ICD-10-CM

## 2022-07-20 DIAGNOSIS — F03B11 Unspecified dementia, moderate, with agitation: Secondary | ICD-10-CM | POA: Diagnosis not present

## 2022-07-20 DIAGNOSIS — L8990 Pressure ulcer of unspecified site, unspecified stage: Secondary | ICD-10-CM | POA: Diagnosis present

## 2022-07-20 DIAGNOSIS — R609 Edema, unspecified: Secondary | ICD-10-CM | POA: Diagnosis not present

## 2022-07-20 HISTORY — DX: Pneumonia, unspecified organism: J18.9

## 2022-07-20 LAB — CBC WITH DIFFERENTIAL/PLATELET
Abs Immature Granulocytes: 0.09 10*3/uL — ABNORMAL HIGH (ref 0.00–0.07)
Basophils Absolute: 0.1 10*3/uL (ref 0.0–0.1)
Basophils Relative: 0 %
Eosinophils Absolute: 0.3 10*3/uL (ref 0.0–0.5)
Eosinophils Relative: 2 %
HCT: 23.2 % — ABNORMAL LOW (ref 39.0–52.0)
Hemoglobin: 7 g/dL — ABNORMAL LOW (ref 13.0–17.0)
Immature Granulocytes: 1 %
Lymphocytes Relative: 11 %
Lymphs Abs: 1.7 10*3/uL (ref 0.7–4.0)
MCH: 26.5 pg (ref 26.0–34.0)
MCHC: 30.2 g/dL (ref 30.0–36.0)
MCV: 87.9 fL (ref 80.0–100.0)
Monocytes Absolute: 1.2 10*3/uL — ABNORMAL HIGH (ref 0.1–1.0)
Monocytes Relative: 8 %
Neutro Abs: 12.2 10*3/uL — ABNORMAL HIGH (ref 1.7–7.7)
Neutrophils Relative %: 78 %
Platelets: 416 10*3/uL — ABNORMAL HIGH (ref 150–400)
RBC: 2.64 MIL/uL — ABNORMAL LOW (ref 4.22–5.81)
RDW: 15.9 % — ABNORMAL HIGH (ref 11.5–15.5)
WBC: 15.4 10*3/uL — ABNORMAL HIGH (ref 4.0–10.5)
nRBC: 0 % (ref 0.0–0.2)

## 2022-07-20 LAB — RESP PANEL BY RT-PCR (RSV, FLU A&B, COVID)  RVPGX2
Influenza A by PCR: NEGATIVE
Influenza B by PCR: NEGATIVE
Resp Syncytial Virus by PCR: NEGATIVE
SARS Coronavirus 2 by RT PCR: NEGATIVE

## 2022-07-20 LAB — COMPREHENSIVE METABOLIC PANEL
ALT: 22 U/L (ref 0–44)
AST: 25 U/L (ref 15–41)
Albumin: 2 g/dL — ABNORMAL LOW (ref 3.5–5.0)
Alkaline Phosphatase: 42 U/L (ref 38–126)
Anion gap: 8 (ref 5–15)
BUN: 33 mg/dL — ABNORMAL HIGH (ref 8–23)
CO2: 28 mmol/L (ref 22–32)
Calcium: 8.1 mg/dL — ABNORMAL LOW (ref 8.9–10.3)
Chloride: 104 mmol/L (ref 98–111)
Creatinine, Ser: 1.59 mg/dL — ABNORMAL HIGH (ref 0.61–1.24)
GFR, Estimated: 46 mL/min — ABNORMAL LOW (ref >60)
Glucose, Bld: 154 mg/dL — ABNORMAL HIGH (ref 70–99)
Potassium: 3.4 mmol/L — ABNORMAL LOW (ref 3.5–5.1)
Sodium: 140 mmol/L (ref 135–145)
Total Bilirubin: 0.3 mg/dL (ref 0.3–1.2)
Total Protein: 7.4 g/dL (ref 6.5–8.1)

## 2022-07-20 LAB — URINALYSIS, ROUTINE W REFLEX MICROSCOPIC
Bilirubin Urine: NEGATIVE
Glucose, UA: NEGATIVE mg/dL
Ketones, ur: NEGATIVE mg/dL
Nitrite: NEGATIVE
Protein, ur: 100 mg/dL — AB
RBC / HPF: >50 RBC/hpf (ref 0–5)
Specific Gravity, Urine: 1.013 (ref 1.005–1.030)
WBC, UA: >50 WBC/hpf (ref 0–5)
pH: 6 (ref 5.0–8.0)

## 2022-07-20 LAB — PHOSPHORUS: Phosphorus: 3.5 mg/dL (ref 2.5–4.6)

## 2022-07-20 LAB — POC OCCULT BLOOD, ED: Fecal Occult Bld: POSITIVE — AB

## 2022-07-20 LAB — LACTIC ACID, PLASMA: Lactic Acid, Venous: 1.5 mmol/L (ref 0.5–1.9)

## 2022-07-20 LAB — MAGNESIUM: Magnesium: 2.1 mg/dL (ref 1.7–2.4)

## 2022-07-20 LAB — PROTIME-INR
INR: 1.2 (ref 0.8–1.2)
Prothrombin Time: 15.2 seconds (ref 11.4–15.2)

## 2022-07-20 LAB — PREPARE RBC (CROSSMATCH)

## 2022-07-20 LAB — APTT: aPTT: 38 seconds — ABNORMAL HIGH (ref 24–36)

## 2022-07-20 MED ORDER — SENNOSIDES-DOCUSATE SODIUM 8.6-50 MG PO TABS
1.0000 | ORAL_TABLET | Freq: Every day | ORAL | Status: DC
  Administered 2022-07-20 – 2022-07-23 (×4): 1 via ORAL
  Filled 2022-07-20 (×4): qty 1

## 2022-07-20 MED ORDER — SODIUM CHLORIDE 0.9 % IV SOLN
1.0000 g | Freq: Once | INTRAVENOUS | Status: AC
  Administered 2022-07-20: 1 g via INTRAVENOUS
  Filled 2022-07-20: qty 10

## 2022-07-20 MED ORDER — BACLOFEN 10 MG PO TABS
10.0000 mg | ORAL_TABLET | Freq: Two times a day (BID) | ORAL | Status: DC
  Administered 2022-07-20 – 2022-07-24 (×8): 10 mg via ORAL
  Filled 2022-07-20 (×8): qty 1

## 2022-07-20 MED ORDER — SODIUM CHLORIDE 0.9 % IV SOLN
500.0000 mg | INTRAVENOUS | Status: DC
  Administered 2022-07-21 – 2022-07-22 (×2): 500 mg via INTRAVENOUS
  Filled 2022-07-20 (×2): qty 5

## 2022-07-20 MED ORDER — DOXAZOSIN MESYLATE 2 MG PO TABS
1.0000 mg | ORAL_TABLET | Freq: Every morning | ORAL | Status: DC
  Administered 2022-07-21 – 2022-07-24 (×4): 1 mg via ORAL
  Filled 2022-07-20 (×4): qty 1

## 2022-07-20 MED ORDER — SODIUM CHLORIDE 0.9% IV SOLUTION: Freq: Once | INTRAVENOUS | Status: AC

## 2022-07-20 MED ORDER — ACETAMINOPHEN 650 MG RE SUPP
650.0000 mg | Freq: Four times a day (QID) | RECTAL | Status: DC | PRN
  Administered 2022-07-21: 650 mg via RECTAL
  Filled 2022-07-20: qty 1

## 2022-07-20 MED ORDER — ONDANSETRON HCL 4 MG PO TABS: 4.0000 mg | ORAL_TABLET | Freq: Four times a day (QID) | ORAL | Status: DC | PRN

## 2022-07-20 MED ORDER — MIRTAZAPINE 15 MG PO TABS
7.5000 mg | ORAL_TABLET | Freq: Every day | ORAL | Status: DC
  Administered 2022-07-20 – 2022-07-23 (×4): 7.5 mg via ORAL
  Filled 2022-07-20 (×4): qty 1

## 2022-07-20 MED ORDER — ACETAMINOPHEN 325 MG PO TABS
650.0000 mg | ORAL_TABLET | Freq: Four times a day (QID) | ORAL | Status: DC | PRN
  Administered 2022-07-20: 650 mg via ORAL
  Filled 2022-07-20: qty 2

## 2022-07-20 MED ORDER — POLYETHYLENE GLYCOL 3350 17 G PO PACK: 17.0000 g | PACK | Freq: Every day | ORAL | Status: DC | PRN

## 2022-07-20 MED ORDER — SODIUM CHLORIDE 0.9 % IV SOLN: 1.0000 g | INTRAVENOUS | Status: DC

## 2022-07-20 MED ORDER — SODIUM CHLORIDE 0.9 % IV SOLN
500.0000 mg | Freq: Once | INTRAVENOUS | Status: AC
  Administered 2022-07-20: 500 mg via INTRAVENOUS
  Filled 2022-07-20: qty 5

## 2022-07-20 MED ORDER — PANTOPRAZOLE SODIUM 40 MG IV SOLR
40.0000 mg | Freq: Every day | INTRAVENOUS | Status: DC
  Administered 2022-07-20 – 2022-07-24 (×5): 40 mg via INTRAVENOUS
  Filled 2022-07-20 (×5): qty 10

## 2022-07-20 MED ORDER — ROSUVASTATIN CALCIUM 20 MG PO TABS
20.0000 mg | ORAL_TABLET | Freq: Every day | ORAL | Status: DC
  Administered 2022-07-20 – 2022-07-23 (×4): 20 mg via ORAL
  Filled 2022-07-20 (×4): qty 1

## 2022-07-20 MED ORDER — ONDANSETRON HCL 4 MG/2ML IJ SOLN: 4.0000 mg | Freq: Four times a day (QID) | INTRAMUSCULAR | Status: DC | PRN

## 2022-07-20 NOTE — ED Triage Notes (Signed)
Patient BIB EMS from [facility] for blood in his stool. Had a BM and had blood in his stool, small amount of bright red blood. Patient had Dementia and is confused, history of CVA with L side hemiplegia.

## 2022-07-20 NOTE — H&P (Signed)
History and Physical    Patient: Jason Jones [id number] DOB: 14-Sep-1950 DOA: 07/20/2022 DOS: the patient was seen and examined on 07/20/2022 PCP: Arthur Holms, NP  Patient coming from: Home  Chief Complaint:  Chief Complaint  Patient presents with   Rectal Bleeding    Blood in stool x 1 when he had a BM, small amount of bright red blood   HPI: Jason Jones is a 72 y.o. 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  He is unable to provide further information, but able to answer simple questions.  No headache, dyspnea, chest pain, abdominal or back pain at the moment.  Lab work: His urinalysis showed proteinuria with large leukocyte esterase, more than 50 RBC, more than 50 WBC and many bacteria.  Negative coronavirus, influenza and RSV PCR.  Fecal occult blood was positive.  CBC is her white count of 15.4, hemoglobin 7.0 g/dL and platelets 416.  PT 15.2, INR 1.2 and PTT 38.  Lactic acid was normal.  Unremarkable magnesium and phosphorus.  CMP with a potassium 3.4 mmol/L, the rest of the electrolytes were normal after calcium was corrected.  Glucose 154, BUN 33 and creatinine 1.59 mg/dL.  Total proteins were normal, except for an albumin of 2.0 g/dL.  Imaging: Portable 1 view chest radiograph with left base consolidation and effusion.  ED course: Initial vital signs temperature 98  21,  Pulse 92, respirations 20, BP 112/64 mmHg O2 sat 90% on room air.  Current sats in the mid 90s on 2 LPM via nasal cannula.   Review of Systems: As mentioned in the history of present illness. All other systems reviewed and are negative. Past Medical History:  Diagnosis Date   Arthritis    Asthma    Cancer ([place])     Chronic alcohol abuse    Dyspnea    GERD (gastroesophageal reflux disease)    PRN  ---  TAKES BAKING SODA IN WATER   History of pneumothorax    04-28-2006  fell, left fx rib--  resolved w/ chest tube   Hypertension    Nocturia    Poor dental hygiene    Right inguinal hernia    Weak urinary stream    Past Surgical History:  Procedure Laterality Date   COLONOSCOPY  01/08/2021   GOLD SEED IMPLANT N/A 05/23/2021   Procedure: GOLD SEED IMPLANT;  Surgeon: Ceasar Mons, MD;  [facility];  Service: Urology;  Laterality: N/A;   INGUINAL HERNIA REPAIR Right 04/23/2017   Procedure: OPEN RIGHT INGUINAL HERNIA REPAIR WITH MESH;  Surgeon: Kinsinger, Arta Bruce, MD;  Location: [facility];  Service: General;  Laterality: Right;  GENERAL COMBINED WITH REGIONAL FOR POST OP PAIN    INSERTION OF MESH Right 04/23/2017   Procedure: INSERTION OF MESH;  Surgeon: Kinsinger, Arta Bruce, MD;  Location: [facility];  Service: General;  Laterality: Right;  GENERAL COMBINED WITH REGIONAL FOR POST OP PAIN    LIPOMA EXCISION Right 02/22/2015   Procedure: RIGHT SHOULDER MASS EXCISION 15 CM SQ;  Surgeon: Mickeal Skinner, MD;  Location: [facility];  Service: General;  Laterality: Right;   SPACE OAR INSTILLATION N/A 05/23/2021   Procedure: SPACE OAR INSTILLATION;  Surgeon: Ceasar Mons, MD;  [facility];  Service: Urology;  Laterality: N/A;   TRANSRECTAL ULTRASOUND N/A 05/23/2021   Procedure: TRANSRECTAL ULTRASOUND;  Surgeon: Ceasar Mons, MD;  [facility];  Service: Urology;  Laterality: N/A;   Social History:  reports that he has been smoking cigarettes. He has a 25.00 pack-year smoking history. He has never used smokeless tobacco. He reports that he does not currently use alcohol. He reports that he does not currently use drugs after having used the following drugs: Marijuana.  No Known Allergies  Family History   Problem Relation Age of Onset   Prostate cancer Cousin    Prostate cancer Cousin     Prior to Admission medications   Medication Sig Start Date End Date Taking? Authorizing Provider  albuterol (PROVENTIL) (2.5 MG/3ML) 0.083% nebulizer solution Take 3 mLs (2.5 mg total) by nebulization every 4 (four) hours as needed for wheezing or shortness of breath. 03/28/22   Nani Gasser, MD  amLODipine (NORVASC) 5 MG tablet Take 1 tablet (5 mg total) by mouth daily. Patient not taking: Reported on 06/28/2022 03/29/22   Nani Gasser, MD  aspirin 81 MG chewable tablet Chew 1 tablet (81 mg total) by mouth daily. Patient taking differently: Chew 81 mg by mouth in the morning. 03/29/22   Nani Gasser, MD  atenolol (TENORMIN) 25 MG tablet Take 12.5-25 mg by mouth See admin instructions. 12.5 mg every morning + 25 mg daily as needed for HR > 99.    [provider]  B Complex-Biotin-FA TABS Take 1 tablet by mouth in the morning.    [provider]  baclofen (LIORESAL) 10 MG tablet Take 10 mg by mouth 2 (two) times daily.    [provider]  CALCIUM ALGINATE EX Apply 1 application  topically See admin instructions. Calcium Alginate-Silver external pad 4"x 5". Apply to left ankle and foot topically every day shift every other day for wound healing.    [provider]  diphenhydrAMINE (BENADRYL) 25 MG tablet Take 25 mg by mouth See admin instructions. 25 mg at bedtime for 7 days (starting 06/27/22) + 25 mg daily as needed for allergic reaction    [provider]  doxazosin (CARDURA) 1 MG tablet Take 1 mg by mouth in the morning.    [provider]  Emollient (AQUAPHOR OINTMENT BODY EX) Apply 1 application  topically in the morning. To lower legs and feet    [provider]  feeding supplement (ENSURE ENLIVE / ENSURE PLUS) LIQD Take 237 mLs by mouth 2 (two) times daily between meals. Patient not taking: Reported on 06/28/2022 03/28/22    Nani Gasser, MD  food thickener (SIMPLYTHICK, NECTAR/LEVEL 2/MILDLY THICK,) GEL Take 1 packet by mouth as needed. Patient not taking: Reported on 05/01/2022 03/28/22   Nani Gasser, MD  gabapentin (NEURONTIN) 100 MG capsule Take 200 mg by mouth 2 (two) times daily.    [provider]  hydrocortisone cream 1 % Apply 1 Application topically in the morning. To right arm    [provider]  mirtazapine (REMERON) 7.5 MG tablet Take 7.5 mg by mouth at bedtime.    [provider]  polyethylene glycol (MIRALAX / GLYCOLAX) 17 g packet Take 17 g by mouth daily as needed. 04/16/22   Hosie Poisson, MD  rosuvastatin (CRESTOR) 20 MG tablet Take 1 tablet (20 mg total) by mouth daily. Patient taking differently: Take 10 mg by mouth every other day. (At bedtime) 03/29/22   Nani Gasser, MD  senna-docusate (SENOKOT-S)  8.6-50 MG tablet Take 1 tablet by mouth at bedtime.    [provider]  sodium chloride (OCEAN) 0.65 % SOLN nasal spray Place 1 spray into both nostrils as needed for congestion. 04/16/22   Hosie Poisson, MD    Physical Exam: Vitals:   07/20/22 1430 07/20/22 1445 07/20/22 1500 07/20/22 1512  BP:      Pulse: 94 97    Resp: 17 17    Temp:   (!) 101.6 F (38.7 C)   TempSrc:   Rectal   SpO2: 96% 94%    Weight:    63 kg  Height:    5' 11"$  (1.803 m)   Physical Exam Vitals and nursing note reviewed.  Constitutional:      General: He is awake. He is not in acute distress.    Appearance: Normal appearance. He is ill-appearing.  HENT:     Head: Normocephalic.     Nose: No rhinorrhea.     Mouth/Throat:     Mouth: Mucous membranes are dry.  Eyes:     General: No scleral icterus.    Pupils: Pupils are equal, round, and reactive to light.  Neck:     Vascular: No JVD.  Cardiovascular:     Rate and Rhythm: Normal rate and regular rhythm.     Heart sounds: S1 normal and S2 normal.  Pulmonary:     Effort: Pulmonary effort is normal.     Breath  sounds: Normal breath sounds.  Abdominal:     General: Bowel sounds are normal. There is no distension.     Palpations: Abdomen is soft.     Tenderness: There is no abdominal tenderness. There is no guarding.  Musculoskeletal:     Cervical back: Neck supple.     Right lower leg: No edema.     Left lower leg: No edema.  Skin:    General: Skin is warm and dry.  Neurological:     Mental Status: He is alert. He is disoriented.     Motor: Weakness and atrophy present.     Comments:   Left-sided hemiparesis.  Psychiatric:        Mood and Affect: Mood normal.        Behavior: Behavior is cooperative.   Data Reviewed:  Results are pending, will review when available.  Assessment and Plan: Principal Problem:   CAP (community acquired pneumonia) Admit to PCU/inpatient. Continue supplemental oxygen. Scheduled and as needed bronchodilators. Continue ceftriaxone 1 g IVPB daily. Continue azithromycin 500 mg IVPB daily. Check strep pneumoniae urinary antigen. Check sputum Gram stain, culture and sensitivity. Follow-up blood culture and sensitivity. Follow-up CBC and chemistry in the morning.  Active Problems:   Normocytic anemia In the setting of:   Rectal bleeding Continue PRBC transfusion. Monitor hematocrit and hemoglobin. Transfuse further as needed. GI has been consulted.    Primary hypertension Blood pressures are soft. On doxazosin 1 mg p.o. daily. Continue BP monitoring.    BPH (benign prostatic hyperplasia) Resume Cardura 1 mg in AM.    Pressure injury of skin Continue local care and preventive measures. Consult wound care as needed.    Protein-calorie malnutrition, severe (HCC) Continue protein supplementation. Consider nutritional services evaluation.    History of CVA (cerebrovascular accident) Supportive care.     Advance Care Planning:   Code Status: Full Code   Consults:   Family Communication:   Severity of Illness: The appropriate patient  status for this patient is INPATIENT. Inpatient status is judged  to be reasonable and necessary in order to provide the required intensity of service to ensure the patient's safety. The patient's presenting symptoms, physical exam findings, and initial radiographic and laboratory data in the context of their chronic comorbidities is felt to place them at high risk for further clinical deterioration. Furthermore, it is not anticipated that the patient will be medically stable for discharge from the hospital within 2 midnights of admission.   * I certify that at the point of admission it is my clinical judgment that the patient will require inpatient hospital care spanning beyond 2 midnights from the point of admission due to high intensity of service, high risk for further deterioration and high frequency of surveillance required.*  Author: Reubin Milan, MD 07/20/2022 4:08 PM  For on call review [email or web address].   This document was prepared using Dragon voice recognition software and may contain some unintended transcription errors.

## 2022-07-20 NOTE — ED Notes (Signed)
ED TO INPATIENT HANDOFF REPORT  ED Nurse Name and Phone #: Alroy Bailiff Name/Age/Gender Jason Jones 72 y.o. male Room/Bed: WA08/WA08  Code Status   Code Status: Full Code  Home/SNF/Other Nursing home  Patient oriented to: self Is this baseline? Yes   Triage Complete: Triage complete  Chief Complaint CAP (community acquired pneumonia) [J18.9]  Triage Note Patient BIB EMS from [facility] for blood in his stool. Had a BM and had blood in his stool, small amount of bright red blood. Patient had Dementia and is confused, history of CVA with L side hemiplegia.    Allergies No Known Allergies  Level of Care/Admitting Diagnosis ED Disposition     ED Disposition  Admit   Condition  --   Comment  Hospital Area: [facility] [[id number]]  Level of Care: Progressive [102]  Admit to Progressive based on following criteria: GI, ENDOCRINE disease patients with GI bleeding, acute liver failure or pancreatitis, stable with diabetic ketoacidosis or thyrotoxicosis (hypothyroid) state.  Admit to Progressive based on following criteria: RESPIRATORY PROBLEMS hypoxemic/hypercapnic respiratory failure that is responsive to NIPPV (BiPAP) or High Flow Nasal Cannula (6-80 lpm). Frequent assessment/intervention, no > Q2 hrs < Q4 hrs, to maintain oxygenation and pulmonary hygiene.  May admit patient to [facility] or [facility] if equivalent level of care is available:: No  Covid Evaluation: Asymptomatic - no recent exposure (last 10 days) testing not required  Diagnosis: CAP (community acquired pneumonia) [id number]  Admitting Physician: Reubin Milan [id number]  Attending Physician: Reubin Milan [id number]  Certification:: I certify this patient will need inpatient services for at least 2 midnights  Estimated Length of Stay: 2          B Medical/Surgery History Past Medical History:  Diagnosis Date   Arthritis    Asthma    Cancer ([place])    Chronic  alcohol abuse    Dyspnea    GERD (gastroesophageal reflux disease)    PRN  ---  TAKES BAKING SODA IN WATER   History of pneumothorax    04-28-2006  fell, left fx rib--  resolved w/ chest tube   Hypertension    Nocturia    Poor dental hygiene    Right inguinal hernia    Weak urinary stream    Past Surgical History:  Procedure Laterality Date   COLONOSCOPY  01/08/2021   GOLD SEED IMPLANT N/A 05/23/2021   Procedure: GOLD SEED IMPLANT;  Surgeon: Ceasar Mons, MD;  [facility];  Service: Urology;  Laterality: N/A;   INGUINAL HERNIA REPAIR Right 04/23/2017   Procedure: OPEN RIGHT INGUINAL HERNIA REPAIR WITH MESH;  Surgeon: Kinsinger, Arta Bruce, MD;  Location: [facility];  Service: General;  Laterality: Right;  GENERAL COMBINED WITH REGIONAL FOR POST OP PAIN    INSERTION OF MESH Right 04/23/2017   Procedure: INSERTION OF MESH;  Surgeon: Kinsinger, Arta Bruce, MD;  Location: [facility];  Service: General;  Laterality: Right;  GENERAL COMBINED WITH REGIONAL FOR POST OP PAIN    LIPOMA EXCISION Right 02/22/2015   Procedure: RIGHT SHOULDER MASS EXCISION 15 CM SQ;  Surgeon: Mickeal Skinner, MD;  Location: [facility];  Service: General;  Laterality: Right;   SPACE OAR INSTILLATION N/A 05/23/2021   Procedure: SPACE OAR INSTILLATION;  Surgeon: Ceasar Mons, MD;  [facility];  Service: Urology;  Laterality: N/A;   TRANSRECTAL ULTRASOUND N/A 05/23/2021   Procedure: TRANSRECTAL ULTRASOUND;  Surgeon: Lovena Neighbours,  Conception Oms, MD;  [facility];  Service: Urology;  Laterality: N/A;     A IV Location/Drains/Wounds Patient Lines/Drains/Airways Status     Active Line/Drains/Airways     Name Placement date Placement time Site Days   Peripheral IV 07/20/22 20 G Posterior;Right Forearm 07/20/22  1416  Forearm  less than 1   Peripheral IV 07/20/22 18 G 1" Right Antecubital 07/20/22  1447  Antecubital  less than 1    Urethral Catheter Lanell Matar, RN [place] 18 Fr. 04/12/22  1319  Coude  99            Intake/Output Last 24 hours No intake or output data in the 24 hours ending 07/20/22 1615  Labs/Imaging Results for orders placed or performed during the hospital encounter of 07/20/22 (from the past 48 hour(s))  Comprehensive metabolic panel     Status: Abnormal   Collection Time: 07/20/22  2:20 PM  Result Value Ref Range   Sodium 140 135 - 145 mmol/L   Potassium 3.4 (L) 3.5 - 5.1 mmol/L   Chloride 104 98 - 111 mmol/L   CO2 28 22 - 32 mmol/L   Glucose, Bld 154 (H) 70 - 99 mg/dL    Comment: Glucose reference range applies only to samples taken after fasting for at least 8 hours.   BUN 33 (H) 8 - 23 mg/dL   Creatinine, Ser 1.59 (H) 0.61 - 1.24 mg/dL   Calcium 8.1 (L) 8.9 - 10.3 mg/dL   Total Protein 7.4 6.5 - 8.1 g/dL   Albumin 2.0 (L) 3.5 - 5.0 g/dL   AST 25 15 - 41 U/L   ALT 22 0 - 44 U/L   Alkaline Phosphatase 42 38 - 126 U/L   Total Bilirubin 0.3 0.3 - 1.2 mg/dL   GFR, Estimated 46 (L) >60 mL/min    Comment: (NOTE) Calculated using the CKD-EPI Creatinine Equation (2021)    Anion gap 8 5 - 15    Comment: Performed at [facility], [place] [address] [place], [place] [address]  CBC with Differential     Status: Abnormal   Collection Time: 07/20/22  2:20 PM  Result Value Ref Range   WBC 15.4 (H) 4.0 - 10.5 K/uL   RBC 2.64 (L) 4.22 - 5.81 MIL/uL   Hemoglobin 7.0 (L) 13.0 - 17.0 g/dL   HCT 23.2 (L) 39.0 - 52.0 %   MCV 87.9 80.0 - 100.0 fL   MCH 26.5 26.0 - 34.0 pg   MCHC 30.2 30.0 - 36.0 g/dL   RDW 15.9 (H) 11.5 - 15.5 %   Platelets 416 (H) 150 - 400 K/uL   nRBC 0.0 0.0 - 0.2 %   Neutrophils Relative % 78 %   Neutro Abs 12.2 (H) 1.7 - 7.7 K/uL   Lymphocytes Relative 11 %   Lymphs Abs 1.7 0.7 - 4.0 K/uL   Monocytes Relative 8 %   Monocytes Absolute 1.2 (H) 0.1 - 1.0 K/uL   Eosinophils Relative 2 %   Eosinophils Absolute 0.3 0.0 - 0.5 K/uL   Basophils Relative 0 %    Basophils Absolute 0.1 0.0 - 0.1 K/uL   Immature Granulocytes 1 %   Abs Immature Granulocytes 0.09 (H) 0.00 - 0.07 K/uL    Comment: Performed at [facility], [place] [address] [place], [place] [address]  Protime-INR     Status: None   Collection Time: 07/20/22  2:20 PM  Result Value Ref Range   Prothrombin Time 15.2  11.4 - 15.2 seconds   INR 1.2 0.8 - 1.2    Comment: (NOTE) INR goal varies based on device and disease states. Performed at [facility], [place] [address] [place], [place] [address]   APTT     Status: Abnormal   Collection Time: 07/20/22  2:20 PM  Result Value Ref Range   aPTT 38 (H) 24 - 36 seconds    Comment:        IF BASELINE aPTT IS ELEVATED, SUGGEST PATIENT RISK ASSESSMENT BE USED TO DETERMINE APPROPRIATE ANTICOAGULANT THERAPY. Performed at [facility], [place] [address] [place], [place] [address]   POC occult blood, ED     Status: Abnormal   Collection Time: 07/20/22  2:27 PM  Result Value Ref Range   Fecal Occult Bld POSITIVE (A) NEGATIVE  Type and screen [place]     Status: None   Collection Time: 07/20/22  2:30 PM  Result Value Ref Range   ABO/RH(D) A POS    Antibody Screen NEG    Sample Expiration      07/23/2022,2359 Performed at [facility], [place] [address], [place], [place] [address]   Lactic acid, plasma     Status: None   Collection Time: 07/20/22  2:45 PM  Result Value Ref Range   Lactic Acid, Venous 1.5 0.5 - 1.9 mmol/L    Comment: Performed at [facility], [place] [address] [place], [place] [address]  Resp panel by RT-PCR (RSV, Flu A&B, Covid) Anterior Nasal Swab     Status: None   Collection Time: 07/20/22  2:45 PM   Specimen: Anterior Nasal Swab  Result Value Ref Range   SARS Coronavirus 2 by RT PCR NEGATIVE NEGATIVE    Comment: (NOTE) SARS-CoV-2 target nucleic acids are NOT DETECTED.  The SARS-CoV-2 RNA is generally  detectable in upper respiratory specimens during the acute phase of infection. The lowest concentration of SARS-CoV-2 viral copies this assay can detect is 138 copies/mL. A negative result does not preclude SARS-Cov-2 infection and should not be used as the sole basis for treatment or other patient management decisions. A negative result may occur with  improper specimen collection/handling, submission of specimen other than nasopharyngeal swab, presence of viral mutation(s) within the areas targeted by this assay, and inadequate number of viral copies(<138 copies/mL). A negative result must be combined with clinical observations, patient history, and epidemiological information. The expected result is Negative.  Fact Sheet for Patients:  EntrepreneurPulse.com.au  Fact Sheet for Healthcare Providers:  IncredibleEmployment.be  This test is no t yet approved or cleared by the [place] FDA and  has been authorized for detection and/or diagnosis of SARS-CoV-2 by FDA under an Emergency Use Authorization (EUA). This EUA will remain  in effect (meaning this test can be used) for the duration of the COVID-19 declaration under Section 564(b)(1) of the Act, 21 U.S.C.section 360bbb-3(b)(1), unless the authorization is terminated  or revoked sooner.       Influenza A by PCR NEGATIVE NEGATIVE   Influenza B by PCR NEGATIVE NEGATIVE    Comment: (NOTE) The Xpert Xpress SARS-CoV-2/FLU/RSV plus assay is intended as an aid in the diagnosis of influenza from Nasopharyngeal swab specimens and should not be used as a sole basis for treatment. Nasal washings and aspirates are unacceptable for Xpert Xpress SARS-CoV-2/FLU/RSV testing.  Fact Sheet for Patients: EntrepreneurPulse.com.au  Fact Sheet for Healthcare Providers: IncredibleEmployment.be  This test is not yet approved or cleared  by the [place] and has  been authorized for detection and/or diagnosis of SARS-CoV-2 by FDA under an Emergency Use Authorization (EUA). This EUA will remain in effect (meaning this test can be used) for the duration of the COVID-19 declaration under Section 564(b)(1) of the Act, 21 U.S.C. section 360bbb-3(b)(1), unless the authorization is terminated or revoked.     Resp Syncytial Virus by PCR NEGATIVE NEGATIVE    Comment: (NOTE) Fact Sheet for Patients: EntrepreneurPulse.com.au  Fact Sheet for Healthcare Providers: IncredibleEmployment.be  This test is not yet approved or cleared by the [place] FDA and has been authorized for detection and/or diagnosis of SARS-CoV-2 by FDA under an Emergency Use Authorization (EUA). This EUA will remain in effect (meaning this test can be used) for the duration of the COVID-19 declaration under Section 564(b)(1) of the Act, 21 U.S.C. section 360bbb-3(b)(1), unless the authorization is terminated or revoked.  Performed at [facility], [place] [address] [place], [place] [address]    DG Chest Port 1 View  Result Date: 07/20/2022 CLINICAL DATA:  Sepsis EXAM: PORTABLE CHEST 1 VIEW COMPARISON:  Fifteen 3 FINDINGS: Left base consolidation and evidence fusion. Lungs are otherwise clear. No pneumothorax. Calcified ectatic aorta. Unremarkable cardiac silhouette. IMPRESSION: Left base consolidation and effusion. Electronically Signed   By: Sammie Bench M.D.   On: 07/20/2022 15:04    Pending Labs Unresulted Labs (From admission, onward)     Start     Ordered   07/21/22 0500  CBC  Tomorrow morning,   R        07/20/22 1601   07/21/22 0500  Comprehensive metabolic panel  Tomorrow morning,   R        07/20/22 1601   07/20/22 2000  Hemoglobin and hematocrit, blood  Once-Timed,   TIMED        07/20/22 1610   07/20/22 1602  Magnesium  Add-on,   AD        07/20/22 1601   07/20/22 1602  Phosphorus  Add-on,   AD         07/20/22 1601   07/20/22 1600  Urinalysis, Routine w reflex microscopic -  Once,   R        07/20/22 1600   07/20/22 1559  Prepare RBC (crossmatch)  (Blood Administration Adult)  Once,   R       Question Answer Comment  # of Units 1 unit   Transfusion Indications Hemoglobin < 7 gm/dL and symptomatic   Number of Units to Keep Ahead NO units ahead   If emergent release call blood bank Not emergent release      07/20/22 1559   07/20/22 1428  Culture, Urine (Do not remove urinary catheter, catheter placed by urology or difficult to place)  (Undifferentiated presentation (screening labs and basic nursing orders))  Once,   URGENT       Question:  Indication  Answer:  Sepsis   07/20/22 1428   07/20/22 1427  Blood Culture (routine x 2)  (Undifferentiated presentation (screening labs and basic nursing orders))  BLOOD CULTURE X 2,   STAT      07/20/22 1428            Vitals/Pain Today's Vitals   07/20/22 1430 07/20/22 1445 07/20/22 1500 07/20/22 1512  BP:      Pulse: 94 97    Resp: 17 17    Temp:   (!) 101.6 F (38.7 C)   TempSrc:   Rectal  SpO2: 96% 94%    Weight:    63 kg  Height:    5' 11"$  (1.803 m)  PainSc:        Isolation Precautions Airborne and Contact precautions  Medications Medications  cefTRIAXone (ROCEPHIN) 1 g in sodium chloride 0.9 % 100 mL IVPB (1 g Intravenous New Bag/Given 07/20/22 1555)  azithromycin (ZITHROMAX) 500 mg in sodium chloride 0.9 % 250 mL IVPB (500 mg Intravenous New Bag/Given 07/20/22 1557)  0.9 %  sodium chloride infusion (Manually program via Guardrails IV Fluids) (has no administration in time range)  acetaminophen (TYLENOL) tablet 650 mg (has no administration in time range)    Or  acetaminophen (TYLENOL) suppository 650 mg (has no administration in time range)  ondansetron (ZOFRAN) tablet 4 mg (has no administration in time range)    Or  ondansetron (ZOFRAN) injection 4 mg (has no administration in time range)  pantoprazole (PROTONIX)  injection 40 mg (has no administration in time range)    Mobility non-ambulatory     Focused Assessments    R Recommendations: See Admitting Provider Note  Report given to:   Additional Notes:

## 2022-07-20 NOTE — ED Provider Notes (Addendum)
[facility] Provider Note   CSN: [id number] Arrival date & time: 07/20/22  1354     History  Chief Complaint  Patient presents with   Rectal Bleeding    Blood in stool x 1 when he had a BM, small amount of bright red blood    Jason Jones is a 72 y.o. male.  Patient is a 72 year old male who presents with rectal bleeding.  He lives in [place].  Per EMS report and staff, he had a bowel movement with some bright red blood mixed in.  They states it was a small amount of blood.  Patient does have dementia.  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  He denies any abdominal pain.  No nausea or vomiting.  No cough or cold symptoms.  No known fevers.  No vomiting or diarrhea.  He is not currently on anticoagulants.  His pulse ox was noted to be 90% on room air by EMS and he was placed on nasal cannula 2 L.  This was verified on arrival to the ED and was also 90% on room air so was placed back on nasal cannula 2 L.  Patient denies any recent coughing or cold symptoms.  No shortness of breath.       Home Medications Prior to Admission medications   Medication Sig Start Date End Date Taking? Authorizing Provider  albuterol (PROVENTIL) (2.5 MG/3ML) 0.083% nebulizer solution Take 3 mLs (2.5 mg total) by nebulization every 4 (four) hours as needed for wheezing or shortness of breath. 03/28/22   Nani Gasser, MD  amLODipine (NORVASC) 5 MG tablet Take 1 tablet (5 mg total) by mouth daily. Patient not taking: Reported on 06/28/2022 03/29/22   Nani Gasser, MD  aspirin 81 MG chewable tablet Chew 1 tablet (81 mg total) by mouth daily. Patient taking differently: Chew 81 mg by mouth in the morning. 03/29/22   Nani Gasser, MD  atenolol (TENORMIN) 25 MG tablet  Take 12.5-25 mg by mouth See admin instructions. 12.5 mg every morning + 25 mg daily as needed for HR > 99.    [provider]  B Complex-Biotin-FA TABS Take 1 tablet by mouth in the morning.    [provider]  baclofen (LIORESAL) 10 MG tablet Take 10 mg by mouth 2 (two) times daily.    [provider]  CALCIUM ALGINATE EX Apply 1 application  topically See admin instructions. Calcium Alginate-Silver external pad 4"x 5". Apply to left ankle and foot topically every day shift every other day for wound healing.    [provider]  diphenhydrAMINE (BENADRYL) 25 MG tablet Take 25 mg by mouth See admin instructions. 25 mg at bedtime for 7 days (starting 06/27/22) + 25 mg daily as needed for allergic reaction    [provider]  doxazosin (CARDURA) 1 MG tablet Take 1 mg by mouth in the morning.    [provider]  Emollient (AQUAPHOR OINTMENT BODY EX) Apply 1 application  topically in the morning. To lower legs and feet    [provider]  feeding supplement (ENSURE ENLIVE / ENSURE PLUS) LIQD Take 237 mLs by mouth 2 (two) times daily between meals. Patient not taking: Reported on 06/28/2022 03/28/22   Nani Gasser, MD  food thickener (SIMPLYTHICK, NECTAR/LEVEL 2/MILDLY THICK,) GEL Take 1 packet by  mouth as needed. Patient not taking: Reported on 05/01/2022 03/28/22   Nani Gasser, MD  gabapentin (NEURONTIN) 100 MG capsule Take 200 mg by mouth 2 (two) times daily.    [provider]  hydrocortisone cream 1 % Apply 1 Application topically in the morning. To right arm    [provider]  mirtazapine (REMERON) 7.5 MG tablet Take 7.5 mg by mouth at bedtime.    [provider]  polyethylene glycol (MIRALAX / GLYCOLAX) 17 g packet Take 17 g by mouth daily as needed. 04/16/22   Hosie Poisson, MD  rosuvastatin (CRESTOR) 20 MG tablet Take 1 tablet (20 mg total) by mouth daily. Patient taking differently: Take 10 mg by  mouth every other day. (At bedtime) 03/29/22   Nani Gasser, MD  senna-docusate (SENOKOT-S) 8.6-50 MG tablet Take 1 tablet by mouth at bedtime.    [provider]  sodium chloride (OCEAN) 0.65 % SOLN nasal spray Place 1 spray into both nostrils as needed for congestion. 04/16/22   Hosie Poisson, MD      Allergies    Patient has no known allergies.    Review of Systems   Review of Systems  Constitutional:  Negative for chills, diaphoresis, fatigue and fever.  HENT:  Negative for congestion, rhinorrhea and sneezing.   Eyes: Negative.   Respiratory:  Negative for cough, chest tightness and shortness of breath.   Cardiovascular:  Negative for chest pain and leg swelling.  Gastrointestinal:  Positive for blood in stool. Negative for abdominal pain, diarrhea, nausea and vomiting.  Genitourinary:  Negative for difficulty urinating, flank pain, frequency and hematuria.  Musculoskeletal:  Negative for arthralgias and back pain.  Skin:  Negative for rash.  Neurological:  Negative for dizziness, speech difficulty, weakness, numbness and headaches.    Physical Exam Updated Vital Signs BP 112/64 (BP Location: Right Arm)   Pulse 97   Temp (!) 101.6 F (38.7 C) (Rectal)   Resp 17   Ht 5' 11"$  (1.803 m)   Wt 63 kg   SpO2 94%   BMI 19.37 kg/m  Physical Exam Constitutional:      Appearance: He is well-developed.  HENT:     Head: Normocephalic and atraumatic.  Eyes:     Pupils: Pupils are equal, round, and reactive to light.  Cardiovascular:     Rate and Rhythm: Normal rate and regular rhythm.     Heart sounds: Normal heart sounds.  Pulmonary:     Effort: Pulmonary effort is normal. No respiratory distress.     Breath sounds: Normal breath sounds. No wheezing or rales.  Chest:     Chest wall: No tenderness.  Abdominal:     General: Bowel sounds are normal.     Palpations: Abdomen is soft.     Tenderness: There is no abdominal tenderness. There is no guarding or rebound.      Comments: Rectal exam shows some bright red blood in the rectal vault  Musculoskeletal:        General: Normal range of motion.     Cervical back: Normal range of motion and neck supple.  Lymphadenopathy:     Cervical: No cervical adenopathy.  Skin:    General: Skin is warm and dry.     Findings: No rash.     Comments: Feels hot to the touch  Neurological:     Mental Status: He is alert and oriented to person, place, and time.     Comments: Patient has left side weakness  from prior stroke with some contractures of his left arm.  He has some slight facial drooping.  He is oriented to person place and month but cannot tell me the correct year.     ED Results / Procedures / Treatments   Labs (all labs ordered are listed, but only abnormal results are displayed) Labs Reviewed  COMPREHENSIVE METABOLIC PANEL - Abnormal; Notable for the following components:      Result Value   Potassium 3.4 (*)    Glucose, Bld 154 (*)    BUN 33 (*)    Creatinine, Ser 1.59 (*)    Calcium 8.1 (*)    Albumin 2.0 (*)    GFR, Estimated 46 (*)    All other components within normal limits  CBC WITH DIFFERENTIAL/PLATELET - Abnormal; Notable for the following components:   WBC 15.4 (*)    RBC 2.64 (*)    Hemoglobin 7.0 (*)    HCT 23.2 (*)    RDW 15.9 (*)    Platelets 416 (*)    Neutro Abs 12.2 (*)    Monocytes Absolute 1.2 (*)    Abs Immature Granulocytes 0.09 (*)    All other components within normal limits  APTT - Abnormal; Notable for the following components:   aPTT 38 (*)    All other components within normal limits  POC OCCULT BLOOD, ED - Abnormal; Notable for the following components:   Fecal Occult Bld POSITIVE (*)    All other components within normal limits  CULTURE, BLOOD (ROUTINE X 2)  CULTURE, BLOOD (ROUTINE X 2)  URINE CULTURE  RESP PANEL BY RT-PCR (RSV, FLU A&B, COVID)  RVPGX2  LACTIC ACID, PLASMA  PROTIME-INR  URINALYSIS, ROUTINE W REFLEX MICROSCOPIC  TYPE AND SCREEN   PREPARE RBC (CROSSMATCH)    EKG EKG Interpretation  Date/Time:  Saturday July 20 2022 14:38:45 EST Ventricular Rate:  92 PR Interval:  149 QRS Duration: 75 QT Interval:  358 QTC Calculation: 443 R Axis:   76 Text Interpretation: Sinus rhythm SINCE LAST TRACING HEART RATE HAS INCREASED Confirmed by Malvin Johns [id number]) on 07/20/2022 3:03:06 PM  Radiology DG Chest Port 1 View  Result Date: 07/20/2022 CLINICAL DATA:  Sepsis EXAM: PORTABLE CHEST 1 VIEW COMPARISON:  Fifteen 3 FINDINGS: Left base consolidation and evidence fusion. Lungs are otherwise clear. No pneumothorax. Calcified ectatic aorta. Unremarkable cardiac silhouette. IMPRESSION: Left base consolidation and effusion. Electronically Signed   By: Sammie Bench M.D.   On: 07/20/2022 15:04    Procedures Procedures    Medications Ordered in ED Medications  cefTRIAXone (ROCEPHIN) 1 g in sodium chloride 0.9 % 100 mL IVPB (1 g Intravenous New Bag/Given 07/20/22 1555)  azithromycin (ZITHROMAX) 500 mg in sodium chloride 0.9 % 250 mL IVPB (500 mg Intravenous New Bag/Given 07/20/22 1557)  0.9 %  sodium chloride infusion (Manually program via Guardrails IV Fluids) (has no administration in time range)    ED Course/ Medical Decision Making/ A&P                             Medical Decision Making Amount and/or Complexity of Data Reviewed Labs: ordered. Radiology: ordered. ECG/medicine tests: ordered.  Risk Prescription drug management. Decision regarding hospitalization.   Patient is a 15 male who presents with rectal bleeding.  On exam, he did have some bright red blood per rectum but no ongoing bleeding is noted.  His Hemoccult is positive.  He was noted to be  quite warm on exam, rectal temp was checked which was 101.6.  Sepsis order set was started.  Appears that he has a left lower lobe infiltrate with effusion noted on his chest x-ray.  This was interpreted by me and confirmed by the radiologist.  He was started on  IV antibiotics.  His labs show an elevated WBC count at 15,000.  His hemoglobin is low at 7.  On chart review he was recently admitted for anemia.  He has a baseline hemoglobin in the mid eights.  It was 7 on his recent admission and he was given a unit of blood.  He has noted to be slightly hypoxic with room air sat of 90% but is maintaining well on a nasal cannula.  His lactic acid is normal.  Type and screen was ordered.  His creatinine is 1.59 which is slightly higher than his prior values.  I discussed the patient with Dr. Olevia Bowens who will admit the patient for further treatment.  Given that his hemoglobin was right at 7, and discussion with Dr. Olevia Bowens, we opted to go ahead and give him a unit of blood.  CRITICAL CARE Performed by: Malvin Johns Total critical care time: 60 minutes Critical care time was exclusive of separately billable procedures and treating other patients. Critical care was necessary to treat or prevent imminent or life-threatening deterioration. Critical care was time spent personally by me on the following activities: development of treatment plan with patient and/or surrogate as well as nursing, discussions with consultants, evaluation of patient's response to treatment, examination of patient, obtaining history from patient or surrogate, ordering and performing treatments and interventions, ordering and review of laboratory studies, ordering and review of radiographic studies, pulse oximetry and re-evaluation of patient's condition.   Final Clinical Impression(s) / ED Diagnoses Final diagnoses:  Rectal bleeding  Anemia, unspecified type  Community acquired pneumonia of left lower lobe of lung    Rx / DC Orders ED Discharge Orders     None         Malvin Johns, MD 07/20/22 1600    Malvin Johns, MD 07/20/22 1600

## 2022-07-21 DIAGNOSIS — J189 Pneumonia, unspecified organism: Secondary | ICD-10-CM | POA: Diagnosis not present

## 2022-07-21 LAB — COMPREHENSIVE METABOLIC PANEL
ALT: 21 U/L (ref 0–44)
AST: 21 U/L (ref 15–41)
Albumin: 1.9 g/dL — ABNORMAL LOW (ref 3.5–5.0)
Alkaline Phosphatase: 40 U/L (ref 38–126)
Anion gap: 10 (ref 5–15)
BUN: 35 mg/dL — ABNORMAL HIGH (ref 8–23)
CO2: 26 mmol/L (ref 22–32)
Calcium: 7.8 mg/dL — ABNORMAL LOW (ref 8.9–10.3)
Chloride: 104 mmol/L (ref 98–111)
Creatinine, Ser: 1.59 mg/dL — ABNORMAL HIGH (ref 0.61–1.24)
GFR, Estimated: 46 mL/min — ABNORMAL LOW (ref >60)
Glucose, Bld: 112 mg/dL — ABNORMAL HIGH (ref 70–99)
Potassium: 3.6 mmol/L (ref 3.5–5.1)
Sodium: 140 mmol/L (ref 135–145)
Total Bilirubin: 0.4 mg/dL (ref 0.3–1.2)
Total Protein: 6.8 g/dL (ref 6.5–8.1)

## 2022-07-21 LAB — CBC
HCT: 23.7 % — ABNORMAL LOW (ref 39.0–52.0)
Hemoglobin: 7.3 g/dL — ABNORMAL LOW (ref 13.0–17.0)
MCH: 26.6 pg (ref 26.0–34.0)
MCHC: 30.8 g/dL (ref 30.0–36.0)
MCV: 86.5 fL (ref 80.0–100.0)
Platelets: 364 10*3/uL (ref 150–400)
RBC: 2.74 MIL/uL — ABNORMAL LOW (ref 4.22–5.81)
RDW: 16.5 % — ABNORMAL HIGH (ref 11.5–15.5)
WBC: 17.7 10*3/uL — ABNORMAL HIGH (ref 4.0–10.5)
nRBC: 0 % (ref 0.0–0.2)

## 2022-07-21 LAB — STREP PNEUMONIAE URINARY ANTIGEN: Strep Pneumo Urinary Antigen: NEGATIVE

## 2022-07-21 LAB — PREPARE RBC (CROSSMATCH)

## 2022-07-21 MED ORDER — DEXTROSE IN LACTATED RINGERS 5 % IV SOLN: INTRAVENOUS | Status: DC

## 2022-07-21 MED ORDER — CHLORHEXIDINE GLUCONATE CLOTH 2 % EX PADS
6.0000 | MEDICATED_PAD | Freq: Every day | CUTANEOUS | Status: DC
  Administered 2022-07-21 – 2022-07-24 (×5): 6 via TOPICAL

## 2022-07-21 MED ORDER — POTASSIUM CHLORIDE CRYS ER 20 MEQ PO TBCR
20.0000 meq | EXTENDED_RELEASE_TABLET | Freq: Once | ORAL | Status: AC
  Administered 2022-07-21: 20 meq via ORAL
  Filled 2022-07-21: qty 1

## 2022-07-21 MED ORDER — SODIUM CHLORIDE 0.9% IV SOLUTION: Freq: Once | INTRAVENOUS | Status: AC

## 2022-07-21 MED ORDER — MEDIHONEY WOUND/BURN DRESSING EX PSTE
1.0000 | PASTE | Freq: Every day | CUTANEOUS | Status: DC
  Administered 2022-07-21 – 2022-07-23 (×3): 1 via TOPICAL
  Filled 2022-07-21: qty 44

## 2022-07-21 MED ORDER — SODIUM CHLORIDE 0.9 % IV SOLN
2.0000 g | INTRAVENOUS | Status: DC
  Administered 2022-07-21: 2 g via INTRAVENOUS

## 2022-07-21 NOTE — Consult Note (Signed)
WOC Nurse Consult Note: Reason for Consult:Two chronic wounds, one at left ankle and left anterior foot Wound type:venous insufficiency, pressure  Pressure Injury POA: Yes Measurement:To be obtained by Bedside RN today with application of next dressing Wound [id number] nonviable tissue, see nursing flow sheet Drainage (amount, consistency, odor) scant serous Periwound:intact Dressing procedure/placement/frequency: I have provided guidance for the care of these lesions using a daily cleanse and dry followed by application of leptospermum Manuka honey (MediHoney) topped with dry gauze and secured with Kerlix roll gauze/paper tape. Foot is to be placed into Prevalon boot. A sacral foam is provided for PI prevention.  [place] nursing team will not follow, but will remain available to this patient, the nursing and medical teams.  Please re-consult if needed.  Thank you for inviting [place] to participate in this patient's Plan of Care.  Maudie Flakes, MSN, RN, CNS, [place], Serita Grammes, [employer], [employer] phone:  [phone]

## 2022-07-21 NOTE — Consult Note (Addendum)
Referring Provider: Dr. Tyrell Antonio Primary Care Physician:  Arthur Holms, NP Primary Gastroenterologist:  UNASSIGNED  Reason for Consultation:  Rectal bleeding; Anemia  HPI: Jason Jones is a 72 y.o. male with multiple medical problems as stated below seen for a consult due to anemia and rectal bleeding. Heme positive. Hgb 7 (8.7 on 06/30/22). Reportedly had a small amount of bright red blood in his stool at his SNF. Bright red blood seen on rectal exam by EDP. No bleeding since admit to the floor. Denies abdominal pain, N/V. He thinks he had a colonoscopy 6 weeks ago but does not know where but history is difficult with his dementia. Recurrent hematuria. Colonoscopy in 2022 per chart below but unknown where (he says "across the street"). History of prostate cancer with radiation seed implant.  Past Medical History:  Diagnosis Date   Arthritis    Asthma    Cancer ([place])    Chronic alcohol abuse    Dyspnea    GERD (gastroesophageal reflux disease)    PRN  ---  TAKES BAKING SODA IN WATER   History of pneumothorax    04-28-2006  fell, left fx rib--  resolved w/ chest tube   Hypertension    Nocturia    Poor dental hygiene    Right inguinal hernia    Weak urinary stream   - CVA - Chronic Hepatitis C  Past Surgical History:  Procedure Laterality Date   COLONOSCOPY  01/08/2021   GOLD SEED IMPLANT N/A 05/23/2021   Procedure: GOLD SEED IMPLANT;  Surgeon: Ceasar Mons, MD;  Location: [facility];  Service: Urology;  Laterality: N/A;   INGUINAL HERNIA REPAIR Right 04/23/2017   Procedure: OPEN RIGHT INGUINAL HERNIA REPAIR WITH MESH;  Surgeon: Kinsinger, Arta Bruce, MD;  Location: [facility];  Service: General;  Laterality: Right;  GENERAL COMBINED WITH REGIONAL FOR POST OP PAIN    INSERTION OF MESH Right 04/23/2017   Procedure: INSERTION OF MESH;  Surgeon: Kinsinger, Arta Bruce, MD;  Location: [facility];  Service: General;  Laterality: Right;  GENERAL  COMBINED WITH REGIONAL FOR POST OP PAIN    LIPOMA EXCISION Right 02/22/2015   Procedure: RIGHT SHOULDER MASS EXCISION 15 CM SQ;  Surgeon: Mickeal Skinner, MD;  Location[facility];  Service: General;  Laterality: Right;   SPACE OAR INSTILLATION N/A 05/23/2021   Procedure: SPACE OAR INSTILLATION;  Surgeon: Ceasar Mons, MD;  Location: [facility];  Service: Urology;  Laterality: N/A;   TRANSRECTAL ULTRASOUND N/A 05/23/2021   Procedure: TRANSRECTAL ULTRASOUND;  Surgeon: Ceasar Mons, MD;  Location: [facility];  Service: Urology;  Laterality: N/A;    Prior to Admission medications   Medication Sig Start Date End Date Taking? Authorizing Provider  albuterol (PROVENTIL) (2.5 MG/3ML) 0.083% nebulizer solution Take 3 mLs (2.5 mg total) by nebulization every 4 (four) hours as needed for wheezing or shortness of breath. 03/28/22  Yes Nani Gasser, MD  aspirin 81 MG chewable tablet Chew 1 tablet (81 mg total) by mouth daily. Patient taking differently: Chew 81 mg by mouth in the morning. 03/29/22  Yes Nani Gasser, MD  atenolol (TENORMIN) 25 MG tablet Take 12.5-25 mg by mouth See admin instructions. Take 12.5 mg by mouth once a day and an additional 25 mg "as needed for a HR >99 BPM"   Yes [provider]  B Complex-Biotin-FA TABS Take 1 tablet by mouth in the morning.   Yes [provider]  baclofen (LIORESAL)  10 MG tablet Take 10 mg by mouth 2 (two) times daily.   Yes [provider]  CALCIUM ALGINATE EX Apply 1 application  topically See admin instructions. Calcium Alginate-Silver external pad 4"x 5". Apply to left ankle and foot topically every day shift every other day for wound healing.   Yes [provider]  doxazosin (CARDURA) 1 MG tablet Take 1 mg by mouth in the morning.   Yes [provider]  Emollient (AQUAPHOR OINTMENT BODY EX) Apply 1 application  topically See admin instructions. Apply to the lower  legs and feet every shift   Yes [provider]  ferrous gluconate (FERGON) 324 MG tablet Take 324 mg by mouth daily with breakfast. 07/02/22 08/31/22 Yes [provider]  gabapentin (NEURONTIN) 100 MG capsule Take 200-300 mg by mouth See admin instructions. Take 200 mg by mouth once a day and 300 mg in the evening   Yes [provider]  mirtazapine (REMERON) 7.5 MG tablet Take 7.5 mg by mouth at bedtime.   Yes [provider]  NON FORMULARY Take 120 mLs by mouth See admin instructions. MedPass 2.0 - Drink 120 ml's by mouth three times a day   Yes [provider]  polyethylene glycol (MIRALAX / GLYCOLAX) 17 g packet Take 17 g by mouth daily as needed. Patient taking differently: Take 17 g by mouth daily as needed (for constipation). 04/16/22  Yes Hosie Poisson, MD  rosuvastatin (CRESTOR) 20 MG tablet Take 1 tablet (20 mg total) by mouth daily. Patient taking differently: Take 10-20 mg by mouth See admin instructions. Take 20 mg by mouth at bedtime every other night, alternating with the 10 mg strength 03/29/22  Yes Nani Gasser, MD  senna-docusate (SENOKOT-S) 8.6-50 MG tablet Take 1 tablet by mouth at bedtime.   Yes [provider]  sodium chloride (OCEAN) 0.65 % SOLN nasal spray Place 1 spray into both nostrils as needed for congestion. 04/16/22  Yes Hosie Poisson, MD  triamcinolone cream (KENALOG) 0.1 % Apply 1 Application topically See admin instructions. Apply to rashes every day and evening shift- right arm, right thigh, left thigh, and any other affected areas   Yes [provider]  feeding supplement (ENSURE ENLIVE / ENSURE PLUS) LIQD Take 237 mLs by mouth 2 (two) times daily between meals. Patient not taking: Reported on 07/20/2022 03/28/22   Nani Gasser, MD  food thickener (SIMPLYTHICK, NECTAR/LEVEL 2/MILDLY THICK,) GEL Take 1 packet by mouth as needed. Patient not taking: Reported on 07/20/2022 03/28/22   Nani Gasser,  MD    Scheduled Meds:  sodium chloride   Intravenous Once   baclofen  10 mg Oral BID   Chlorhexidine Gluconate Cloth  6 each Topical Daily   doxazosin  1 mg Oral q AM   mirtazapine  7.5 mg Oral QHS   pantoprazole (PROTONIX) IV  40 mg Intravenous Daily   rosuvastatin  20 mg Oral QHS   senna-docusate  1 tablet Oral QHS   Continuous Infusions:  azithromycin 500 mg (07/21/22 1011)   cefTRIAXone (ROCEPHIN)  IV     dextrose 5% lactated ringers 75 mL/hr at 07/21/22 1001   PRN Meds:.acetaminophen **OR** acetaminophen, ondansetron **OR** ondansetron (ZOFRAN) IV, polyethylene glycol  Allergies as of 07/20/2022 - Review Complete 07/20/2022  Allergen Reaction Noted   Doxycycline Other (See Comments) 07/20/2022    Family History  Problem Relation Age of Onset   Prostate cancer Cousin    Prostate cancer Cousin     Social History  Socioeconomic History   Marital status: Divorced    Spouse name: Not on file   Number of children: Not on file   Years of education: Not on file   Highest education level: Not on file  Occupational History   Not on file  Tobacco Use   Smoking status: Every Day    Packs/day: 0.50    Years: 50.00    Total pack years: 25.00    Types: Cigarettes   Smokeless tobacco: Never  Vaping Use   Vaping Use: Never used  Substance and Sexual Activity   Alcohol use: Not Currently    Comment: last use 2-3 months ago ,   Drug use: Not Currently    Types: Marijuana    Comment: last use 2 years ago per pt   Sexual activity: Not on file  Other Topics Concern   Not on file  Social History Narrative   Not on file   Social Determinants of Health   Financial Resource Strain: Not on file  Food Insecurity: No Food Insecurity (07/20/2022)   Hunger Vital Sign    Worried About Running Out of Food in the Last Year: Never true    Ran Out of Food in the Last Year: Never true  Transportation Needs: No Transportation Needs (07/20/2022)   PRAPARE - Civil engineer, contracting (Medical): No    Lack of Transportation (Non-Medical): No  Physical Activity: Not on file  Stress: Not on file  Social Connections: Not on file  Intimate Partner Violence: Not At Risk (07/20/2022)   Humiliation, Afraid, Rape, and Kick questionnaire    Fear of Current or Ex-Partner: No    Emotionally Abused: No    Physically Abused: No    Sexually Abused: No    Review of Systems: All negative except as stated above in HPI.  Physical Exam: Vital signs: Vitals:   07/20/22 2319 07/21/22 0403  BP:  129/78  Pulse:  87  Resp:  20  Temp: 99.9 F (37.7 C) 97.9 F (36.6 C)  SpO2:  100%   Last BM Date :  (pt unable to recall) General: Lethargic, demented, chronically ill-appearing, no acute distress   Head: normocephalic, atraumatic Eyes: anicteric sclera ENT: oropharynx clear Neck: supple, nontender Lungs:  Clear throughout to auscultation.   No wheezes, crackles, or rhonchi. No acute distress. Heart:  Regular rate and rhythm; no murmurs, clicks, rubs,  or gallops. Abdomen: soft, nontender, nondistended, +BS  Rectal:  Deferred Ext: no edema  GI:  Lab Results: Recent Labs    07/20/22 1420 07/21/22 0020  WBC 15.4* 17.7*  HGB 7.0* 7.3*  HCT 23.2* 23.7*  PLT 416* 364   BMET Recent Labs    07/20/22 1420 07/21/22 0020  NA 140 140  K 3.4* 3.6  CL 104 104  CO2 28 26  GLUCOSE 154* 112*  BUN 33* 35*  CREATININE 1.59* 1.59*  CALCIUM 8.1* 7.8*   LFT Recent Labs    07/21/22 0020  PROT 6.8  ALBUMIN 1.9*  AST 21  ALT 21  ALKPHOS 40  BILITOT 0.4   PT/INR Recent Labs    07/20/22 1420  LABPROT 15.2  INR 1.2     Studies/Results: DG Chest Port 1 View  Result Date: 07/20/2022 CLINICAL DATA:  Sepsis EXAM: PORTABLE CHEST 1 VIEW COMPARISON:  Fifteen 3 FINDINGS: Left base consolidation and evidence fusion. Lungs are otherwise clear. No pneumothorax. Calcified ectatic aorta. Unremarkable cardiac silhouette. IMPRESSION: Left base consolidation and  effusion.  Electronically Signed   By: Sammie Bench M.D.   On: 07/20/2022 15:04    Impression/Plan: Painless hematochezia with worsened anemia - question diverticular bleed. Doubt ischemia. 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 Ideally need past colonoscopy records. Conservative management. Clear liquid diet. Dr. Paulita Fujita will f/u tomorrow.    LOS: 1 day   Lear Ng  07/21/2022, 11:07 AM  Questions please call [phone]

## 2022-07-21 NOTE — Progress Notes (Addendum)
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  Patient unable to provide information due to dementia.  He was found to have pyuria, hematuria, occult blood positive.  Hemoglobin of 7.  Chest x-ray showed left base consolidation and effusion.   Assessment & Plan:   Principal Problem:   CAP (community acquired pneumonia) Active Problems:   Normocytic anemia   Primary hypertension   History of CVA (cerebrovascular accident)   BPH (benign prostatic hyperplasia)   Pressure injury of skin   Protein-calorie malnutrition, severe (HCC)   Rectal bleeding   1-Community-acquired pneumonia, pleural effusion: -Continue with IV antibiotics  -Covid, RSV, Influenza negative.  Plan to repeat chest x ray tomorrow to follow on effusion, might need paracentesis.  Strep Pneumonia negative.   2-Normocytic anemia, Rectal bleeding, hematuria.  Iron deficiency anemia Acute blood loss anemia.  GI consulted Received one unit PRBC transfusion.  Hb still at 7.3 Transfuse another unit.   3-UTI in setting of chronic foley catheter. ; hematuria.  Exchange foley.  UA with more than 50 WBC>  IV antibiotics.   4-AKI: Prior creatinine per records 1.1--0.2 Presents  with a creatinine of 1.5 Continue with IV fluids.    5-Hypertension: On doxazosin  6-BPH, prostate cancer, Chronic indwelling foley catheter:  Continue with Cardura Exchange foley catheter.   7-Pressure Injury:  Support care, local care.   8-Protein caloric malnutrition severe: Will order ensure.    Hypokalemia: Replaced.  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  Pressure Injury 07/20/22 Ankle Anterior;Left Unstageable - Full thickness tissue loss in which the base of the injury is covered by slough (yellow, tan, gray, green or brown) and/or eschar (tan, brown or black) in the wound bed. (Active)  07/20/22 1809  Location: Ankle  Location Orientation: Anterior;Left  Staging: Unstageable - Full thickness tissue loss in which the base of the injury is covered by slough (yellow, tan, gray, green or brown) and/or eschar (tan, brown or black) in the wound bed.  Wound Description (Comments):   Present on Admission: Yes  Dressing Type Non adherent;Gauze (Comment) 07/20/22 1803     Pressure Injury 07/20/22 Foot Anterior;Left Stage 3 -  Full thickness tissue loss. Subcutaneous fat may be visible but bone, tendon or muscle are NOT exposed. (Active)  07/20/22 1810  Location: Foot  Location Orientation: Anterior;Left  Staging: Stage 3 -  Full thickness tissue loss. Subcutaneous fat may be visible but bone, tendon or muscle are NOT exposed.  Wound Description (Comments):   Present on Admission: Yes  Dressing Type Gauze (Comment);Non adherent 07/20/22 1803      Estimated body mass index is 18.56 kg/m as calculated from the following:   Height as of this encounter: 5' 11"$  (1.803 m).   Weight as of this encounter: 60.4 kg.   DVT prophylaxis: SCD Code Status: Full code Family Communication: Daughter over Phone.  Disposition Plan:  Status is: Inpatient Remains inpatient appropriate because: management of PNA, UTI> Anemia    Consultants:  GI  Procedures:  None  Antimicrobials:    Subjective: He is alert,  he has chronic left side weakness.    Objective: Vitals:   07/20/22 2130 07/20/22 2134 07/20/22 2319 07/21/22 0403  BP: 120/73 120/73  129/78  Pulse: 96 96  87  Resp: 19 19  20  $ Temp: (!) 102.1 F  (38.9 C) (!) 102.1 F (38.9 C) 99.9 F (37.7 C) 97.9 F (36.6 C)  TempSrc: Oral Oral Oral Oral  SpO2:  100%  100%  Weight:      Height:        Intake/Output Summary (Last 24 hours) at 07/21/2022 0710 Last data filed at 07/21/2022 0204 Gross per 24 hour  Intake 753.27 ml  Output 400 ml  Net 353.27 ml   Filed Weights   07/20/22 1512 07/20/22 1833  Weight: 63 kg 60.4 kg    Examination:  General exam: Chronic ill appearing.  Respiratory system: Clear to auscultation. Respiratory effort normal. Cardiovascular system: S1 & S2 heard, RRR. No JVD, murmurs, rubs, gallops or clicks. No pedal edema. Gastrointestinal system: Abdomen is nondistended, soft and nontender. No organomegaly or masses felt. Normal bowel sounds heard. Central nervous system: Alert , chronic left side spastic hemiparesis.  Extremities: no edema   Data Reviewed: I have personally reviewed following labs and imaging studies  CBC: Recent Labs  Lab 07/20/22 1420 07/21/22 0020  WBC 15.4* 17.7*  NEUTROABS 12.2*  --   HGB 7.0* 7.3*  HCT 23.2* 23.7*  MCV 87.9 86.5  PLT 416* [id number]   Basic Metabolic Panel: Recent Labs  Lab 07/20/22 1420 07/21/22 0020  NA 140 140  K 3.4* 3.6  CL 104 104  CO2 28 26  GLUCOSE 154* 112*  BUN 33* 35*  CREATININE 1.59* 1.59*  CALCIUM 8.1* 7.8*  MG 2.1  --   PHOS 3.5  --    GFR: Estimated Creatinine Clearance: 35.9 mL/min (A) (by C-G formula based on SCr of 1.59 mg/dL (H)). Liver Function Tests: Recent Labs  Lab 07/20/22 1420 07/21/22 0020  AST 25 21  ALT 22 21  ALKPHOS 42 40  BILITOT 0.3 0.4  PROT 7.4 6.8  ALBUMIN 2.0* 1.9*   No results for input(s): "LIPASE", "AMYLASE" in the last 168 hours. No results for input(s): "AMMONIA" in the last 168 hours. Coagulation Profile: Recent Labs  Lab 07/20/22 1420  INR 1.2   Cardiac Enzymes: No results for input(s): "CKTOTAL", "CKMB", "CKMBINDEX", "TROPONINI" in the last 168 hours. BNP (last 3 results) No results for  input(s): "PROBNP" in the last 8760 hours. HbA1C: No results for input(s): "HGBA1C" in the last 72 hours. CBG: No results for input(s): "GLUCAP" in the last 168 hours. Lipid Profile: No results for input(s): "CHOL", "HDL", "LDLCALC", "TRIG", "CHOLHDL", "LDLDIRECT" in the last 72 hours. Thyroid Function Tests: No results for input(s): "TSH", "T4TOTAL", "FREET4", "T3FREE", "THYROIDAB" in the last 72 hours. Anemia Panel: No results for input(s): "VITAMINB12", "FOLATE", "FERRITIN", "TIBC", "IRON", "RETICCTPCT" in the last 72 hours. Sepsis Labs: Recent Labs  Lab 07/20/22 1445  LATICACIDVEN 1.5    Recent Results (from the past 240 hour(s))  Resp panel by RT-PCR (RSV, Flu A&B, Covid) Anterior Nasal Swab     Status: None   Collection Time: 07/20/22  2:45 PM   Specimen: Anterior Nasal Swab  Result Value Ref Range Status   SARS Coronavirus 2 by RT PCR NEGATIVE NEGATIVE Final    Comment: (NOTE) SARS-CoV-2 target nucleic acids are NOT DETECTED.  The SARS-CoV-2 RNA is generally detectable in upper respiratory specimens during the acute phase of infection. The lowest  concentration of SARS-CoV-2 viral copies this assay can detect is 138 copies/mL. A negative result does not preclude SARS-Cov-2 infection and should not be used as the sole basis for treatment or other patient management decisions. A negative result may occur with  improper specimen collection/handling, submission of specimen other than nasopharyngeal swab, presence of viral mutation(s) within the areas targeted by this assay, and inadequate number of viral copies(<138 copies/mL). A negative result must be combined with clinical observations, patient history, and epidemiological information. The expected result is Negative.  Fact Sheet for Patients:  EntrepreneurPulse.com.au  Fact Sheet for Healthcare Providers:  IncredibleEmployment.be  This test is no t yet approved or cleared by the  [place] FDA and  has been authorized for detection and/or diagnosis of SARS-CoV-2 by FDA under an Emergency Use Authorization (EUA). This EUA will remain  in effect (meaning this test can be used) for the duration of the COVID-19 declaration under Section 564(b)(1) of the Act, 21 U.S.C.section 360bbb-3(b)(1), unless the authorization is terminated  or revoked sooner.       Influenza A by PCR NEGATIVE NEGATIVE Final   Influenza B by PCR NEGATIVE NEGATIVE Final    Comment: (NOTE) The Xpert Xpress SARS-CoV-2/FLU/RSV plus assay is intended as an aid in the diagnosis of influenza from Nasopharyngeal swab specimens and should not be used as a sole basis for treatment. Nasal washings and aspirates are unacceptable for Xpert Xpress SARS-CoV-2/FLU/RSV testing.  Fact Sheet for Patients: EntrepreneurPulse.com.au  Fact Sheet for Healthcare Providers: IncredibleEmployment.be  This test is not yet approved or cleared by the [place] FDA and has been authorized for detection and/or diagnosis of SARS-CoV-2 by FDA under an Emergency Use Authorization (EUA). This EUA will remain in effect (meaning this test can be used) for the duration of the COVID-19 declaration under Section 564(b)(1) of the Act, 21 U.S.C. section 360bbb-3(b)(1), unless the authorization is terminated or revoked.     Resp Syncytial Virus by PCR NEGATIVE NEGATIVE Final    Comment: (NOTE) Fact Sheet for Patients: EntrepreneurPulse.com.au  Fact Sheet for Healthcare Providers: IncredibleEmployment.be  This test is not yet approved or cleared by the [place] FDA and has been authorized for detection and/or diagnosis of SARS-CoV-2 by FDA under an Emergency Use Authorization (EUA). This EUA will remain in effect (meaning this test can be used) for the duration of the COVID-19 declaration under Section 564(b)(1) of the Act, 21 U.S.C. section  360bbb-3(b)(1), unless the authorization is terminated or revoked.  Performed at [facility], [place] [address], [place], [place] [address]          Radiology Studies: DG Chest Port 1 View  Result Date: 07/20/2022 CLINICAL DATA:  Sepsis EXAM: PORTABLE CHEST 1 VIEW COMPARISON:  Fifteen 3 FINDINGS: Left base consolidation and evidence fusion. Lungs are otherwise clear. No pneumothorax. Calcified ectatic aorta. Unremarkable cardiac silhouette. IMPRESSION: Left base consolidation and effusion. Electronically Signed   By: Sammie Bench M.D.   On: 07/20/2022 15:04        Scheduled Meds:  sodium chloride   Intravenous Once   baclofen  10 mg Oral BID   doxazosin  1 mg Oral q AM   mirtazapine  7.5 mg Oral QHS   pantoprazole (PROTONIX) IV  40 mg Intravenous Daily   rosuvastatin  20 mg Oral QHS   senna-docusate  1 tablet Oral QHS   Continuous Infusions:  azithromycin     cefTRIAXone (ROCEPHIN)  IV       LOS:  1 day    Time spent: 35 minutes.     Elmarie Shiley, MD [facility]   If 7PM-7AM, please contact night-coverage [email or web address]  07/21/2022, 7:10 AM

## 2022-07-22 ENCOUNTER — Inpatient Hospital Stay (HOSPITAL_COMMUNITY): Payer: 59

## 2022-07-22 DIAGNOSIS — K625 Hemorrhage of anus and rectum: Secondary | ICD-10-CM | POA: Diagnosis not present

## 2022-07-22 DIAGNOSIS — J189 Pneumonia, unspecified organism: Secondary | ICD-10-CM | POA: Diagnosis not present

## 2022-07-22 DIAGNOSIS — J90 Pleural effusion, not elsewhere classified: Secondary | ICD-10-CM | POA: Diagnosis not present

## 2022-07-22 LAB — TYPE AND SCREEN
ABO/RH(D): A POS
Antibody Screen: NEGATIVE
Unit division: 0
Unit division: 0

## 2022-07-22 LAB — BASIC METABOLIC PANEL
Anion gap: 7 (ref 5–15)
BUN: 25 mg/dL — ABNORMAL HIGH (ref 8–23)
CO2: 25 mmol/L (ref 22–32)
Calcium: 8.2 mg/dL — ABNORMAL LOW (ref 8.9–10.3)
Chloride: 107 mmol/L (ref 98–111)
Creatinine, Ser: 1.6 mg/dL — ABNORMAL HIGH (ref 0.61–1.24)
GFR, Estimated: 45 mL/min — ABNORMAL LOW (ref >60)
Glucose, Bld: 113 mg/dL — ABNORMAL HIGH (ref 70–99)
Potassium: 3.3 mmol/L — ABNORMAL LOW (ref 3.5–5.1)
Sodium: 139 mmol/L (ref 135–145)

## 2022-07-22 LAB — CBC
HCT: 28 % — ABNORMAL LOW (ref 39.0–52.0)
Hemoglobin: 8.8 g/dL — ABNORMAL LOW (ref 13.0–17.0)
MCH: 27.2 pg (ref 26.0–34.0)
MCHC: 31.4 g/dL (ref 30.0–36.0)
MCV: 86.7 fL (ref 80.0–100.0)
Platelets: 381 10*3/uL (ref 150–400)
RBC: 3.23 MIL/uL — ABNORMAL LOW (ref 4.22–5.81)
RDW: 15.9 % — ABNORMAL HIGH (ref 11.5–15.5)
WBC: 16 10*3/uL — ABNORMAL HIGH (ref 4.0–10.5)
nRBC: 0 % (ref 0.0–0.2)

## 2022-07-22 LAB — BPAM RBC
Blood Product Expiration Date: 202403022359
Blood Product Expiration Date: 202403022359
ISSUE DATE / TIME: 202402101842
ISSUE DATE / TIME: 202402111433
Unit Type and Rh: 6200
Unit Type and Rh: 6200

## 2022-07-22 LAB — MAGNESIUM: Magnesium: 1.9 mg/dL (ref 1.7–2.4)

## 2022-07-22 LAB — URINE CULTURE: Culture: >=100000 — AB

## 2022-07-22 MED ORDER — POTASSIUM CHLORIDE 10 MEQ/100ML IV SOLN: 10.0000 meq | INTRAVENOUS | Status: DC

## 2022-07-22 MED ORDER — DIPHENHYDRAMINE HCL 50 MG/ML IJ SOLN: 12.5000 mg | Freq: Four times a day (QID) | INTRAMUSCULAR | Status: DC | PRN

## 2022-07-22 MED ORDER — POTASSIUM CHLORIDE 20 MEQ PO PACK
20.0000 meq | PACK | Freq: Once | ORAL | Status: AC
  Administered 2022-07-22: 20 meq via ORAL
  Filled 2022-07-22: qty 1

## 2022-07-22 MED ORDER — FAMOTIDINE IN NACL 20-0.9 MG/50ML-% IV SOLN
20.0000 mg | INTRAVENOUS | Status: DC
  Administered 2022-07-22 – 2022-07-23 (×2): 20 mg via INTRAVENOUS
  Filled 2022-07-22 (×3): qty 50

## 2022-07-22 MED ORDER — LEVOFLOXACIN IN D5W 750 MG/150ML IV SOLN
750.0000 mg | INTRAVENOUS | Status: DC
  Administered 2022-07-22: 750 mg via INTRAVENOUS
  Filled 2022-07-22: qty 150

## 2022-07-22 MED ORDER — METHYLPREDNISOLONE SODIUM SUCC 40 MG IJ SOLR
40.0000 mg | Freq: Once | INTRAMUSCULAR | Status: AC
  Administered 2022-07-22: 40 mg via INTRAVENOUS
  Filled 2022-07-22: qty 1

## 2022-07-22 MED ORDER — ENSURE ENLIVE PO LIQD
237.0000 mL | Freq: Two times a day (BID) | ORAL | Status: DC
  Administered 2022-07-22 – 2022-07-24 (×3): 237 mL via ORAL

## 2022-07-22 NOTE — Progress Notes (Signed)
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  Patient unable to provide information due to dementia.  He was found to have pyuria, hematuria, occult blood positive.  Hemoglobin of 7.  Chest x-ray showed left base consolidation and effusion.   Assessment & Plan:   Principal Problem:   CAP (community acquired pneumonia) Active Problems:   Normocytic anemia   Primary hypertension   History of CVA (cerebrovascular accident)   BPH (benign prostatic hyperplasia)   Pressure injury of skin   Protein-calorie malnutrition, severe (HCC)   Rectal bleeding   1-Community-acquired pneumonia, pleural effusion: -Continue with IV antibiotics  -Covid, RSV, Influenza negative.  -Chest x ray left pleural effusion infiltrate.  Strep Pneumonia negative.  Evaluated by CCM for pleural effusion, no significant fluid on US> plan to treat for PNA>  Report lips swelling today. Will stop ceftriaxone. Start Levaquin.   2-Normocytic anemia, Rectal bleeding, hematuria.  Iron deficiency anemia Acute blood loss anemia.  GI consulted Received 2  unit PRBC during hospitalization.  Hb increase to 8 post blood transfusion.  No further bleeding, advance diet as tolerated. No colonoscopy planned.   Lips Swelling;  IV benadryl.  Stop ceftriaxone.  Start Pepcid.  Will give one dose IV steroids.   -UTI in setting of chronic foley catheter. ; hematuria.  Exchange foley.  UA with more than 50 WBC>  IV antibiotics.  Urine growing staph Aureus, MRSA, discussed with ID likely  contaminate from chronic  foley catheter.  Blood culture negative.   -AKI: Prior creatinine per records 1.1--0.2 Presents  with a creatinine of 1.5--1.6 Continue with IV fluids.    -Hypertension: On doxazosin  -BPH, prostate cancer, Chronic indwelling foley catheter:  Continue with Cardura Foley catheter exchange 2/11.Marland Kitchen   7-Pressure Injury:  Support care, local care.   8-Protein caloric malnutrition severe: Ensure.   Hypokalemia: Replaced.  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  Pressure Injury 07/20/22 Ankle Anterior;Left Unstageable - Full thickness tissue loss in which the base of the injury is covered by slough (yellow, tan, gray, green or brown) and/or eschar (tan, brown or black) in the wound bed. (Active)  07/20/22 1809  Location: Ankle  Location Orientation: Anterior;Left  Staging: Unstageable - Full thickness tissue loss in which the base of the injury is covered by slough (yellow, tan, gray, green or brown) and/or eschar (tan, brown or black) in the wound bed.  Wound Description (Comments):   Present on Admission: Yes  Dressing Type Non adherent;Gauze (Comment) 07/20/22 1803     Pressure Injury 07/20/22 Foot Anterior;Left Stage 3 -  Full thickness tissue loss. Subcutaneous fat may be visible but bone, tendon or muscle are NOT exposed. (Active)  07/20/22 1810  Location: Foot  Location Orientation: Anterior;Left  Staging: Stage 3 -  Full thickness tissue loss. Subcutaneous fat may be visible but bone, tendon or muscle are NOT exposed.  Wound Description (Comments):   Present on Admission: Yes  Dressing Type Gauze (Comment);Non adherent 07/20/22 1803      Estimated body mass index is 18.56 kg/m as calculated  from the following:   Height as of this encounter: 5' 11"$  (1.803 m).   Weight as of this encounter: 60.4 kg.   DVT prophylaxis: SCD Code Status: Full code Family Communication:  Daughter over Phone 2/11.  Disposition Plan:  Status is: Inpatient Remains inpatient appropriate because: management of PNA, UTI> Anemia    Consultants:  GI  Procedures:  None  Antimicrobials:    Subjective: He is alert, report lips swelling, notice this am.  No rash, no SOB, no tongue swelling.   Objective: Vitals:   07/21/22 1455 07/21/22 1645 07/21/22 2048 07/22/22 0638  BP: 116/79 110/64 135/64 (!) 144/86  Pulse: 90 89 90 87  Resp: 18 18 17 19  $ Temp: 97.6 F (36.4 C) 98.4 F (36.9 C) 99.8 F (37.7 C) 98.3 F (36.8 C)  TempSrc:  Oral Oral Oral  SpO2:   91% 94%  Weight:      Height:        Intake/Output Summary (Last 24 hours) at 07/22/2022 1107 Last data filed at 07/22/2022 0600 Gross per 24 hour  Intake 1468.29 ml  Output 1275 ml  Net 193.29 ml    Filed Weights   07/20/22 1512 07/20/22 1833  Weight: 63 kg 60.4 kg    Examination:  General exam: Chronic ill appearing.  Respiratory system: Clear to auscultation. Respiratory effort normal. Cardiovascular system: S1 & S2 heard, RRR. No JVD, murmurs, rubs, gallops or clicks. No pedal edema. Gastrointestinal system: Abdomen is nondistended, soft and nontender. No organomegaly or masses felt. Normal bowel sounds heard. Central nervous system: Alert , chronic left side spastic hemiparesis.  Extremities: no edema   Data Reviewed: I have personally reviewed following labs and imaging studies  CBC: Recent Labs  Lab 07/20/22 1420 07/21/22 0020 07/22/22 0918  WBC 15.4* 17.7* 16.0*  NEUTROABS 12.2*  --   --   HGB 7.0* 7.3* 8.8*  HCT 23.2* 23.7* 28.0*  MCV 87.9 86.5 86.7  PLT 416* 364 [id number]    Basic Metabolic Panel: Recent Labs  Lab 07/20/22 1420 07/21/22 0020 07/22/22 0918  NA 140 140 139  K 3.4* 3.6 3.3*  CL 104 104 107  CO2 28 26 25  $ GLUCOSE 154* 112* 113*  BUN 33* 35* 25*  CREATININE 1.59* 1.59* 1.60*  CALCIUM 8.1* 7.8* 8.2*  MG 2.1  --  1.9  PHOS 3.5  --   --     GFR: Estimated  Creatinine Clearance: 35.7 mL/min (A) (by C-G formula based on SCr of 1.6 mg/dL (H)). Liver Function Tests: Recent Labs  Lab 07/20/22 1420 07/21/22 0020  AST 25 21  ALT 22 21  ALKPHOS 42 40  BILITOT 0.3 0.4  PROT 7.4 6.8  ALBUMIN 2.0* 1.9*    No results for input(s): "LIPASE", "AMYLASE" in the last 168 hours. No results for input(s): "AMMONIA" in the last 168 hours. Coagulation Profile: Recent Labs  Lab 07/20/22 1420  INR 1.2    Cardiac Enzymes: No results for input(s): "CKTOTAL", "CKMB", "CKMBINDEX", "TROPONINI" in the last 168 hours. BNP (last 3 results) No results for input(s): "PROBNP" in the last 8760 hours. HbA1C: No results for input(s): "HGBA1C" in the last 72 hours. CBG: No results for input(s): "GLUCAP" in the last 168 hours. Lipid Profile: No results for input(s): "CHOL", "HDL", "LDLCALC", "TRIG", "CHOLHDL", "LDLDIRECT" in the last 72 hours. Thyroid Function Tests: No results for input(s): "TSH", "T4TOTAL", "FREET4", "T3FREE", "THYROIDAB" in the last 72 hours. Anemia Panel: No results for input(s): "VITAMINB12", "FOLATE", "FERRITIN", "TIBC", "  IRON", "RETICCTPCT" in the last 72 hours. Sepsis Labs: Recent Labs  Lab 07/20/22 1445  LATICACIDVEN 1.5     Recent Results (from the past 240 hour(s))  Blood Culture (routine x 2)     Status: None (Preliminary result)   Collection Time: 07/20/22  2:45 PM   Specimen: BLOOD RIGHT ARM  Result Value Ref Range Status   Specimen Description   Final    BLOOD RIGHT ARM BOTTLES DRAWN AEROBIC AND ANAEROBIC Performed at [facility], [place] [address] [place], [place] [address]    Special Requests   Final    Blood Culture adequate volume Performed at [facility] [address] [place], [place] [address]    Culture   Final    NO GROWTH < 24 HOURS Performed at [facility] [address] [place], [place] [address]    Report Status PENDING  Incomplete  Blood Culture  (routine x 2)     Status: None (Preliminary result)   Collection Time: 07/20/22  2:45 PM   Specimen: Right Antecubital; Blood  Result Value Ref Range Status   Specimen Description   Final    RIGHT ANTECUBITAL BOTTLES DRAWN AEROBIC AND ANAEROBIC Performed at [facility] [address] [place], [place] [address]    Special Requests   Final    Blood Culture adequate volume Performed at [facility] [address] [place], [place] [address]    Culture   Final    NO GROWTH < 24 HOURS Performed at [facility] [address] [place], [place] [address]    Report Status PENDING  Incomplete  Resp panel by RT-PCR (RSV, Flu A&B, Covid) Anterior Nasal Swab     Status: None   Collection Time: 07/20/22  2:45 PM   Specimen: Anterior Nasal Swab  Result Value Ref Range Status   SARS Coronavirus 2 by RT PCR NEGATIVE NEGATIVE Final    Comment: (NOTE) SARS-CoV-2 target nucleic acids are NOT DETECTED.  The SARS-CoV-2 RNA is generally detectable in upper respiratory specimens during the acute phase of infection. The lowest concentration of SARS-CoV-2 viral copies this assay can detect is 138 copies/mL. A negative result does not preclude SARS-Cov-2 infection and should not be used as the sole basis for treatment or other patient management decisions. A negative result may occur with  improper specimen collection/handling, submission of specimen other than nasopharyngeal swab, presence of viral mutation(s) within the areas targeted by this assay, and inadequate number of viral copies(<138 copies/mL). A negative result must be combined with clinical observations, patient history, and epidemiological information. The expected result is Negative.  Fact Sheet for Patients:  EntrepreneurPulse.com.au  Fact Sheet for Healthcare Providers:  IncredibleEmployment.be  This test is no t yet approved or cleared by the  [place] FDA and  has been authorized for detection and/or diagnosis of SARS-CoV-2 by FDA under an Emergency Use Authorization (EUA). This EUA will remain  in effect (meaning this test can be used) for the duration of the COVID-19 declaration under Section 564(b)(1) of the Act, 21 U.S.C.section 360bbb-3(b)(1), unless the authorization is terminated  or revoked sooner.       Influenza A by PCR NEGATIVE NEGATIVE Final   Influenza B by PCR NEGATIVE NEGATIVE Final    Comment: (NOTE) The Xpert Xpress SARS-CoV-2/FLU/RSV plus assay is intended as an aid in the diagnosis of influenza from Nasopharyngeal swab specimens and should not be used as a sole  basis for treatment. Nasal washings and aspirates are unacceptable for Xpert Xpress SARS-CoV-2/FLU/RSV testing.  Fact Sheet for Patients: EntrepreneurPulse.com.au  Fact Sheet for Healthcare Providers: IncredibleEmployment.be  This test is not yet approved or cleared by the [place] FDA and has been authorized for detection and/or diagnosis of SARS-CoV-2 by FDA under an Emergency Use Authorization (EUA). This EUA will remain in effect (meaning this test can be used) for the duration of the COVID-19 declaration under Section 564(b)(1) of the Act, 21 U.S.C. section 360bbb-3(b)(1), unless the authorization is terminated or revoked.     Resp Syncytial Virus by PCR NEGATIVE NEGATIVE Final    Comment: (NOTE) Fact Sheet for Patients: EntrepreneurPulse.com.au  Fact Sheet for Healthcare Providers: IncredibleEmployment.be  This test is not yet approved or cleared by the [place] FDA and has been authorized for detection and/or diagnosis of SARS-CoV-2 by FDA under an Emergency Use Authorization (EUA). This EUA will remain in effect (meaning this test can be used) for the duration of the COVID-19 declaration under Section 564(b)(1) of the Act, 21 U.S.C. section  360bbb-3(b)(1), unless the authorization is terminated or revoked.  Performed at [facility], [place] [address] [place], [place] [address]   Culture, Urine (Do not remove urinary catheter, catheter placed by urology or difficult to place)     Status: Abnormal   Collection Time: 07/20/22  2:49 PM   Specimen: Urine, Catheterized  Result Value Ref Range Status   Specimen Description   Final    URINE, CATHETERIZED Performed at [facility] [address] [place], [place] [address]    Special Requests   Final    NONE Performed at [facility], [place] [address] [place], [place] [address]    Culture (A)  Final    >=100,000 COLONIES/mL METHICILLIN RESISTANT STAPHYLOCOCCUS AUREUS >=100,000 COLONIES/mL DIPHTHEROIDS(CORYNEBACTERIUM SPECIES) Standardized susceptibility testing for this organism is not available. Performed at [facility], [place] [address] [place], [place] [address]    Report Status 07/22/2022 FINAL  Final   Organism ID, Bacteria METHICILLIN RESISTANT STAPHYLOCOCCUS AUREUS (A)  Final      Susceptibility   Methicillin resistant staphylococcus aureus - MIC*    CIPROFLOXACIN >=8 RESISTANT Resistant     GENTAMICIN <=0.5 SENSITIVE Sensitive     NITROFURANTOIN <=16 SENSITIVE Sensitive     OXACILLIN >=4 RESISTANT Resistant     TETRACYCLINE <=1 SENSITIVE Sensitive     VANCOMYCIN 1 SENSITIVE Sensitive     TRIMETH/SULFA <=10 SENSITIVE Sensitive     CLINDAMYCIN >=8 RESISTANT Resistant     RIFAMPIN <=0.5 SENSITIVE Sensitive     Inducible Clindamycin NEGATIVE Sensitive     * >=100,000 COLONIES/mL METHICILLIN RESISTANT STAPHYLOCOCCUS AUREUS         Radiology Studies: DG Chest 2 View  Result Date: 07/22/2022 CLINICAL DATA:  Pleural effusion. EXAM: CHEST - 2 VIEW COMPARISON:  07/20/2022 FINDINGS: Patient is rotated towards the LEFT. The heart size is normal. RIGHT lung is clear. There is asymmetry of the hemidiaphragms,  LEFT higher and more irregular compared to the RIGHT. Findings raise the question of subpulmonic effusion on the LEFT. There are no consolidations. Remote LEFT rib fractures are present. IMPRESSION: LEFT pleural effusion. Electronically Signed   By: Nolon Nations M.D.   On: 07/22/2022 08:40   DG Chest Port 1 View  Result Date: 07/20/2022 CLINICAL DATA:  Sepsis EXAM: PORTABLE CHEST 1 VIEW COMPARISON:  [place] 3 FINDINGS: Left base consolidation and evidence fusion. Lungs are  otherwise clear. No pneumothorax. Calcified ectatic aorta. Unremarkable cardiac silhouette. IMPRESSION: Left base consolidation and effusion. Electronically Signed   By: Sammie Bench M.D.   On: 07/20/2022 15:04        Scheduled Meds:  baclofen  10 mg Oral BID   Chlorhexidine Gluconate Cloth  6 each Topical Daily   doxazosin  1 mg Oral q AM   leptospermum manuka honey  1 Application Topical Daily   mirtazapine  7.5 mg Oral QHS   pantoprazole (PROTONIX) IV  40 mg Intravenous Daily   rosuvastatin  20 mg Oral QHS   senna-docusate  1 tablet Oral QHS   Continuous Infusions:  azithromycin 500 mg (07/22/22 1035)   dextrose 5% lactated ringers 75 mL/hr at 07/22/22 0416   potassium chloride       LOS: 2 days    Time spent: 35 minutes.     Elmarie Shiley, MD [facility]   If 7PM-7AM, please contact night-coverage [email or web address]  07/22/2022, 11:07 AM

## 2022-07-22 NOTE — Consult Note (Signed)
NAME:  Jason Jones, MRN:  [id number], DOB:  02-03-1951, LOS: 2 ADMISSION DATE:  07/20/2022, CONSULTATION DATE:  07/22/22 REFERRING MD:  Niel Hummer, MD, CHIEF COMPLAINT:  Possible pleural effusion   History of Present Illness:  72 year old male SNF resident who presented with rectal bleeding.  In the ED patient had fever 101.6 and CXR with left infiltrate/effusion. WBC 15.4, Hg 7, PT 15.2, INR 1.2.. BUN/Cr 33/1.59. SpO2 90% and placed on 2L [place]. PRBC x 1 ordered. Patient only able to answer simple questions. Admitted for pneumonia and rectal bleeding. GI consulted.  PCCM consulted for left pleural effusion. He denies shortness of breath, cough or wheezing.   Pertinent  Medical History  Vascular dementia, hx right ischemic PCA stroke with residual left hemiparesis, prostate cancer with chronic foley, asthma, GERD, HTN, chronic hepatitis C, BPH  Significant Hospital Events: Including procedures, antibiotic start and stop dates in addition to other pertinent events   2/11 Admitted  Interim History / Subjective:  As above  Objective   Blood pressure (!) 144/86, pulse 87, temperature 98.3 F (36.8 C), temperature source Oral, resp. rate 19, height 5' 11"$  (1.803 m), weight 60.4 kg, SpO2 94 %.        Intake/Output Summary (Last 24 hours) at 07/22/2022 1144 Last data filed at 07/22/2022 0600 Gross per 24 hour  Intake 1468.29 ml  Output 1275 ml  Net 193.29 ml   Filed Weights   07/20/22 1512 07/20/22 1833  Weight: 63 kg 60.4 kg   Physical Exam: General: Chronically ill-appearing, slurred speech HENT: [place], AT, OP clear, MMM Eyes: EOMI, no scleral icterus Respiratory: Diminished breath sounds to auscultation bilaterally.  No crackles, wheezing or rales Cardiovascular: RRR, -M/R/G, no JVD GI: BS+, soft, nontender Extremities:-Edema,-tenderness Neuro: Awake alert and follows commands, left hemiparesis Psych: Normal mood, normal affect  CXR 07/22/22 - Suspected left pleural effusion  vs elevated left hemidiaphragm  Bedside 07/22/22 - Minimal left pleural effusion   Resolved Hospital Problem list   N/A  Assessment & Plan:   CAP Left pleural effusion Bedside ultrasound with minimal effusion not amenable to drainage --No thoracentesis indicated --Continue CAP coverage --Wean supplemental O2 for goal >90% --Pulmonary toilet with OOB, PT  Pulmonary will sign off  Best Practice (right click and "Reselect all SmartList Selections" daily)   Diet/type: dysphagia diet (see orders) DVT prophylaxis: otherContraindicated GI prophylaxis: PPI Lines: N/A Foley:  N/A Code Status:  full code Last date of multidisciplinary goals of care discussion [ ]$  Per primary team  Labs   CBC: Recent Labs  Lab 07/20/22 1420 07/21/22 0020 07/22/22 0918  WBC 15.4* 17.7* 16.0*  NEUTROABS 12.2*  --   --   HGB 7.0* 7.3* 8.8*  HCT 23.2* 23.7* 28.0*  MCV 87.9 86.5 86.7  PLT 416* 364 [id number]    Basic Metabolic Panel: Recent Labs  Lab 07/20/22 1420 07/21/22 0020 07/22/22 0918  NA 140 140 139  K 3.4* 3.6 3.3*  CL 104 104 107  CO2 28 26 25  $ GLUCOSE 154* 112* 113*  BUN 33* 35* 25*  CREATININE 1.59* 1.59* 1.60*  CALCIUM 8.1* 7.8* 8.2*  MG 2.1  --  1.9  PHOS 3.5  --   --    GFR: Estimated Creatinine Clearance: 35.7 mL/min (A) (by C-G formula based on SCr of 1.6 mg/dL (H)). Recent Labs  Lab 07/20/22 1420 07/20/22 1445 07/21/22 0020 07/22/22 0918  WBC 15.4*  --  17.7* 16.0*  LATICACIDVEN  --  1.5  --   --  Liver Function Tests: Recent Labs  Lab 07/20/22 1420 07/21/22 0020  AST 25 21  ALT 22 21  ALKPHOS 42 40  BILITOT 0.3 0.4  PROT 7.4 6.8  ALBUMIN 2.0* 1.9*   No results for input(s): "LIPASE", "AMYLASE" in the last 168 hours. No results for input(s): "AMMONIA" in the last 168 hours.  ABG    Component Value Date/Time   TCO2 25 03/24/2022 1550     Coagulation Profile: Recent Labs  Lab 07/20/22 1420  INR 1.2    Cardiac Enzymes: No results for  input(s): "CKTOTAL", "CKMB", "CKMBINDEX", "TROPONINI" in the last 168 hours.  HbA1C: Hgb A1c MFr Bld  Date/Time Value Ref Range Status  03/25/2022 12:14 AM 6.0 (H) 4.8 - 5.6 % Final    Comment:    (NOTE) Pre diabetes:          5.7%-6.4%  Diabetes:              >6.4%  Glycemic control for   <7.0% adults with diabetes     CBG: No results for input(s): "GLUCAP" in the last 168 hours.  Review of Systems:   As above  Past Medical History:  He,  has a past medical history of Arthritis, Asthma, Cancer ([place]), Chronic alcohol abuse, Dyspnea, GERD (gastroesophageal reflux disease), History of pneumothorax, Hypertension, Nocturia, Poor dental hygiene, Right inguinal hernia, and Weak urinary stream.   Surgical History:   Past Surgical History:  Procedure Laterality Date   COLONOSCOPY  01/08/2021   GOLD SEED IMPLANT N/A 05/23/2021   Procedure: GOLD SEED IMPLANT;  Surgeon: Ceasar Mons, MD;  [facility]: [facility];  Service: Urology;  Laterality: N/A;   INGUINAL HERNIA REPAIR Right 04/23/2017   Procedure: OPEN RIGHT INGUINAL HERNIA REPAIR WITH MESH;  Surgeon: Kinsinger, Arta Bruce, MD;  Location: [facility];  Service: General;  Laterality: Right;  GENERAL COMBINED WITH REGIONAL FOR POST OP PAIN    INSERTION OF MESH Right 04/23/2017   Procedure: INSERTION OF MESH;  Surgeon: Kinsinger, Arta Bruce, MD;  Location: [facility];  Service: General;  Laterality: Right;  GENERAL COMBINED WITH REGIONAL FOR POST OP PAIN    LIPOMA EXCISION Right 02/22/2015   Procedure: RIGHT SHOULDER MASS EXCISION 15 CM SQ;  Surgeon: Mickeal Skinner, MD;  Location: [facility];  Service: General;  Laterality: Right;   SPACE OAR INSTILLATION N/A 05/23/2021   Procedure: SPACE OAR INSTILLATION;  Surgeon: Ceasar Mons, MD;  [facility];  Service: Urology;  Laterality: N/A;   TRANSRECTAL ULTRASOUND N/A 05/23/2021   Procedure: TRANSRECTAL  ULTRASOUND;  Surgeon: Ceasar Mons, MD;  [facility];  Service: Urology;  Laterality: N/A;     Social History:   reports that he has been smoking cigarettes. He has a 25.00 pack-year smoking history. He has never used smokeless tobacco. He reports that he does not currently use alcohol. He reports that he does not currently use drugs after having used the following drugs: Marijuana.   Family History:  His family history includes Prostate cancer in his cousin and cousin.   Allergies Allergies  Allergen Reactions   Doxycycline Other (See Comments)    "ALLERGIC," per facility document     Home Medications  Prior to Admission medications   Medication Sig Start Date End Date Taking? Authorizing Provider  albuterol (PROVENTIL) (2.5 MG/3ML) 0.083% nebulizer solution Take 3 mLs (2.5 mg total) by nebulization every 4 (four) hours as needed for wheezing or shortness of  breath. 03/28/22  Yes Nani Gasser, MD  aspirin 81 MG chewable tablet Chew 1 tablet (81 mg total) by mouth daily. Patient taking differently: Chew 81 mg by mouth in the morning. 03/29/22  Yes Nani Gasser, MD  atenolol (TENORMIN) 25 MG tablet Take 12.5-25 mg by mouth See admin instructions. Take 12.5 mg by mouth once a day and an additional 25 mg "as needed for a HR >99 BPM"   Yes [provider]  B Complex-Biotin-FA TABS Take 1 tablet by mouth in the morning.   Yes [provider]  baclofen (LIORESAL) 10 MG tablet Take 10 mg by mouth 2 (two) times daily.   Yes [provider]  CALCIUM ALGINATE EX Apply 1 application  topically See admin instructions. Calcium Alginate-Silver external pad 4"x 5". Apply to left ankle and foot topically every day shift every other day for wound healing.   Yes [provider]  doxazosin (CARDURA) 1 MG tablet Take 1 mg by mouth in the morning.   Yes [provider]  Emollient (AQUAPHOR OINTMENT BODY EX) Apply 1 application  topically  See admin instructions. Apply to the lower legs and feet every shift   Yes [provider]  ferrous gluconate (FERGON) 324 MG tablet Take 324 mg by mouth daily with breakfast. 07/02/22 08/31/22 Yes [provider]  gabapentin (NEURONTIN) 100 MG capsule Take 200-300 mg by mouth See admin instructions. Take 200 mg by mouth once a day and 300 mg in the evening   Yes [provider]  mirtazapine (REMERON) 7.5 MG tablet Take 7.5 mg by mouth at bedtime.   Yes [provider]  NON FORMULARY Take 120 mLs by mouth See admin instructions. MedPass 2.0 - Drink 120 ml's by mouth three times a day   Yes [provider]  polyethylene glycol (MIRALAX / GLYCOLAX) 17 g packet Take 17 g by mouth daily as needed. Patient taking differently: Take 17 g by mouth daily as needed (for constipation). 04/16/22  Yes Hosie Poisson, MD  rosuvastatin (CRESTOR) 20 MG tablet Take 1 tablet (20 mg total) by mouth daily. Patient taking differently: Take 10-20 mg by mouth See admin instructions. Take 20 mg by mouth at bedtime every other night, alternating with the 10 mg strength 03/29/22  Yes Nani Gasser, MD  senna-docusate (SENOKOT-S) 8.6-50 MG tablet Take 1 tablet by mouth at bedtime.   Yes [provider]  sodium chloride (OCEAN) 0.65 % SOLN nasal spray Place 1 spray into both nostrils as needed for congestion. 04/16/22  Yes Hosie Poisson, MD  triamcinolone cream (KENALOG) 0.1 % Apply 1 Application topically See admin instructions. Apply to rashes every day and evening shift- right arm, right thigh, left thigh, and any other affected areas   Yes [provider]  feeding supplement (ENSURE ENLIVE / ENSURE PLUS) LIQD Take 237 mLs by mouth 2 (two) times daily between meals. Patient not taking: Reported on 07/20/2022 03/28/22   Nani Gasser, MD  food thickener (SIMPLYTHICK, NECTAR/LEVEL 2/MILDLY THICK,) GEL Take 1 packet by mouth as needed. Patient not taking: Reported  on 07/20/2022 03/28/22   Nani Gasser, MD     Critical care time: N/A    Care Time: 55 min  Rodman Pickle, M.D. [facility] Pulmonary/Critical Care Medicine 07/22/2022 12:25 PM   See Amion for personal pager For hours between 7 PM to 7 AM, please call Elink for urgent questions

## 2022-07-22 NOTE — Progress Notes (Signed)
Subjective: No further bleeding.  Objective: Vital signs in last 24 hours: Temp:  [97.5 F (36.4 C)-101.8 F (38.8 C)] 98.3 F (36.8 C) (02/12 [id number]) Pulse Rate:  [87-100] 87 (02/12 0638) Resp:  [17-19] 19 (02/12 0638) BP: (110-144)/(64-86) 144/86 (02/12 0638) SpO2:  [91 %-94 %] 94 % (02/12 [id number]) Weight change:  Last BM Date :  (pt unable to recall)  PE: GEN:  Elderly, thin- and frail-appearing ABD:  Soft, non-tender  Lab Results: CBC    Component Value Date/Time   WBC 16.0 (H) 07/22/2022 0918   RBC 3.23 (L) 07/22/2022 0918   HGB 8.8 (L) 07/22/2022 0918   HCT 28.0 (L) 07/22/2022 0918   PLT 381 07/22/2022 0918   MCV 86.7 07/22/2022 0918   MCH 27.2 07/22/2022 0918   MCHC 31.4 07/22/2022 0918   RDW 15.9 (H) 07/22/2022 0918   LYMPHSABS 1.7 07/20/2022 1420   MONOABS 1.2 (H) 07/20/2022 1420   EOSABS 0.3 07/20/2022 1420   BASOSABS 0.1 07/20/2022 1420  CMP     Component Value Date/Time   NA 139 07/22/2022 0918   K 3.3 (L) 07/22/2022 0918   CL 107 07/22/2022 0918   CO2 25 07/22/2022 0918   GLUCOSE 113 (H) 07/22/2022 0918   BUN 25 (H) 07/22/2022 0918   CREATININE 1.60 (H) 07/22/2022 0918   CALCIUM 8.2 (L) 07/22/2022 0918   PROT 6.8 07/21/2022 0020   ALBUMIN 1.9 (L) 07/21/2022 0020   AST 21 07/21/2022 0020   ALT 21 07/21/2022 0020   ALKPHOS 40 07/21/2022 0020   BILITOT 0.4 07/21/2022 0020   GFRNONAA 45 (L) 07/22/2022 0918   GFRAA >60 04/21/2017 0957   Assessment:   Hematochezia, resolved. Anemia.  Hgb stable.  Plan:   Advance diet as tolerated. Do not recommend colonoscopy at this time. [facility] will sign-off.  We will arrange outpatient GI follow up with Dr. Michail Sermon, in case elective outpatient colonoscopy is needed.   Landry Dyke 07/22/2022, 11:49 AM   Cell [phone] If no answer or after 5 PM call [phone]

## 2022-07-22 NOTE — Progress Notes (Signed)
Pharmacy Antibiotic Note  Jason Jones is a 72 y.o. male admitted on 07/20/2022 with pneumonia.  Pharmacy has been consulted for Levquin dosing.  Plan: Levaquin 752m iv every 48  hours.  CrCl=35 as of 07/22/22. Renally adjusted Levaquin  Height: 5' 11"$  (180.3 cm) Weight: 60.4 kg (133 lb 1.6 oz) IBW/kg (Calculated) : 75.3  Temp (24hrs), Avg:98.9 F (37.2 C), Min:97.5 F (36.4 C), Max:101.8 F (38.8 C)  Recent Labs  Lab 07/20/22 1420 07/20/22 1445 07/21/22 0020 07/22/22 0918  WBC 15.4*  --  17.7* 16.0*  CREATININE 1.59*  --  1.59* 1.60*  LATICACIDVEN  --  1.5  --   --     Estimated Creatinine Clearance: 35.7 mL/min (A) (by C-G formula based on SCr of 1.6 mg/dL (H)).    Allergies  Allergen Reactions   Ceftriaxone     Possible lips swelling (dose 07/21/22)    Doxycycline Other (See Comments)    "ALLERGIC," per facility document    Antimicrobials this admission: ceftriaxone 2/10 >> 2/11 (discontinued due to lip swelling) azithromycin 2/10 >> 2/12 Levaquin 2/12 >>   Microbiology results: 2/10 BCx: ngtd 2/10 UCx: MRSA; diphtheroids (corynebacterium species)  Thank you for allowing pharmacy to be a part of this patient's care.  Kaityln Kallstrom WPietro Cassis2/05/2023 12:40 PM

## 2022-07-22 NOTE — Evaluation (Signed)
Clinical/Bedside Swallow Evaluation Patient Details  Name: Jason Jones MRN: [id number] Date of Birth: [birth year]  Today's Date: 07/22/2022 Time: SLP Start Time (ACUTE ONLY): 0930 SLP Stop Time (ACUTE ONLY): 0945 SLP Time Calculation (min) (ACUTE ONLY): 15 min  Past Medical History:  Past Medical History:  Diagnosis Date   Arthritis    Asthma    Cancer ([place])    Chronic alcohol abuse    Dyspnea    GERD (gastroesophageal reflux disease)    PRN  ---  TAKES BAKING SODA IN WATER   History of pneumothorax    04-28-2006  fell, left fx rib--  resolved w/ chest tube   Hypertension    Nocturia    Poor dental hygiene    Right inguinal hernia    Weak urinary stream    Past Surgical History:  Past Surgical History:  Procedure Laterality Date   COLONOSCOPY  01/08/2021   GOLD SEED IMPLANT N/A 05/23/2021   Procedure: GOLD SEED IMPLANT;  Surgeon: Ceasar Mons, MD;  [facility];  Service: Urology;  Laterality: N/A;   INGUINAL HERNIA REPAIR Right 04/23/2017   Procedure: OPEN RIGHT INGUINAL HERNIA REPAIR WITH MESH;  Surgeon: Kinsinger, Arta Bruce, MD;  Location: [facility];  Service: General;  Laterality: Right;  GENERAL COMBINED WITH REGIONAL FOR POST OP PAIN    INSERTION OF MESH Right 04/23/2017   Procedure: INSERTION OF MESH;  Surgeon: Kinsinger, Arta Bruce, MD;  Location: [facility];  Service: General;  Laterality: Right;  GENERAL COMBINED WITH REGIONAL FOR POST OP PAIN    LIPOMA EXCISION Right 02/22/2015   Procedure: RIGHT SHOULDER MASS EXCISION 15 CM SQ;  Surgeon: Mickeal Skinner, MD;  [facility];  Service: General;  Laterality: Right;   SPACE OAR INSTILLATION N/A 05/23/2021   Procedure: SPACE OAR INSTILLATION;  Surgeon: Ceasar Mons, MD;  [facility];  Service: Urology;  Laterality: N/A;   TRANSRECTAL ULTRASOUND N/A 05/23/2021   Procedure: TRANSRECTAL ULTRASOUND;  Surgeon: Ceasar Mons, MD;  [facility];  Service: Urology;  Laterality: N/A;   HPI:  Patient is a 72 y.o. 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 He presented to the ED from SNF with rectal bleeding. RSV/COVID/Influenza tests negative and CXR showed LEFT pleural effusion. He had MBS during recent past admission (04/16/2022) and at that time, presented with a mild oral and pharyngeal phase of dysphagia with trace penetration of thin liquids but no aspiration and no pharyngeal retention of PO's.    Assessment / Plan / Recommendation  Clinical Impression  Patient presents with clinical s/s of dysphagia as per this bedside swallow evaluation, however it is difficult to determine if he has had any signficant decline in his swallow function since most recent objective swallow study (MBS) which occured on 04/16/2022. Currently, patient is on clear liquids only as per GI recommendations secondary to rectal bleed. SLP assessed patient's toleration with thin liquids (water) and nectar thick liquids (juice). Swallow initiation appeared mildly delayed but no immediate s/s aspiration or penetration. Patient exhibited delayed coughing which sounded congested and which occured following thin liquids and nectar thick liquids. No solids tested at this time secondary to GI restrictions. SLP recommending continue with clear liquids (thin consistency) and allow for Dys 2 solids when cleared by GI. SLP will follow for diet toleration and ability to advance.  SLP Visit Diagnosis: Dysphagia, unspecified (R13.10)    Aspiration Risk  Mild aspiration risk    Diet Recommendation Other (Comment) (Dys 2 (fine chop), thin liquids when cleared by GI)   Liquid Administration via: Cup;Straw Medication Administration: Crushed with puree Supervision: Full supervision/cueing  for compensatory strategies;Staff to assist with self feeding Compensations: Slow rate;Small sips/bites Postural Changes: Seated upright at 90 degrees;Remain upright for at least 30 minutes after po intake    Other  Recommendations Oral Care Recommendations: Oral care BID;Staff/trained caregiver to provide oral care    Recommendations for follow up therapy are one component of a multi-disciplinary discharge planning process, led by the attending physician.  Recommendations may be updated based on patient status, additional functional criteria and insurance authorization.  Follow up Recommendations Skilled nursing-short term rehab (<3 hours/day)      Assistance Recommended at Discharge    Functional Status Assessment Patient has had a recent decline in their functional status and demonstrates the ability to make significant improvements in function in a reasonable and predictable amount of time.  Frequency and Duration min 2x/week  1 week       Prognosis Prognosis for improved oropharyngeal function: Fair Barriers to Reach Goals: Time post onset;Severity of deficits;Cognitive deficits      Swallow Study   General Date of Onset: 07/20/22 HPI: Patient is a 72 y.o. 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 He presented to the ED from SNF with rectal bleeding. RSV/COVID/Influenza tests negative and CXR showed LEFT pleural effusion. He had MBS during recent past admission (04/16/2022) and at that time, presented with a mild oral and pharyngeal phase of dysphagia with trace penetration of thin liquids but no aspiration and no pharyngeal retention of PO's. Type of Study: Bedside Swallow Evaluation Previous Swallow Assessment: during previous admission, MBS 04/16/22) Diet Prior to this Study: Thin liquids (Level 0) Temperature Spikes  Noted: No Respiratory Status: Room air History of Recent Intubation: No Behavior/Cognition: Alert;Cooperative;Confused;Pleasant mood Oral Cavity Assessment: Within Functional Limits Oral Care Completed by SLP: No Oral Cavity - Dentition: Edentulous Self-Feeding Abilities: Total assist Patient Positioning: Upright in bed Baseline Vocal Quality: Normal Volitional Cough: Congested Volitional Swallow: Unable to elicit    Oral/Motor/Sensory Function Overall Oral Motor/Sensory Function: Moderate impairment Facial ROM: Reduced left Facial Symmetry: Abnormal symmetry left Facial Strength: Reduced left   Ice Chips Ice chips: Not tested   Thin Liquid Thin Liquid: Impaired Presentation: Straw Pharyngeal  Phase Impairments: Cough - Delayed;Suspected delayed Swallow    Nectar Thick Nectar Thick Liquid: Impaired Presentation: Straw Pharyngeal Phase Impairments: Suspected delayed Swallow;Cough - Delayed   Honey Thick     Puree Puree: Not tested   Solid     Solid: Not tested      Sonia Baller, MA, CCC-SLP Speech Therapy

## 2022-07-22 NOTE — TOC Initial Note (Signed)
Transition of Care [facility]) - Initial/Assessment Note    Patient Details  Name: Jason Jones MRN: [id number] Date of Birth: Feb 20, 1951  Transition of Care [facility]) CM/SW Contact:    Illene Regulus, LCSW Phone Number: 07/22/2022, 9:56 AM  Clinical Narrative:                 CSW discussed POA/HCPOA with pt's daughter Jason Jones, CSW explained the hospital can only do HCPOA through the chaplain. Pt's daughter stated she would speak with her father today or tomorrow to discuss this with him. Pt is an LTC resident at [place]. TOC to follow for d/c needs.   Expected Discharge Plan: Long Term Nursing Home Barriers to Discharge: Continued Medical Work up   Patient Goals and CMS Choice Patient states their goals for this hospitalization and ongoing recovery are:: daughter stated for pt to retrun to LTC facility          Expected Discharge Plan and Services       Living arrangements for the past 2 months: [place]                                      Prior Living Arrangements/Services Living arrangements for the past 2 months: [place] Lives with:: Self Patient language and need for interpreter reviewed:: Yes Do you feel safe going back to the place where you live?: Yes      Need for Family Participation in Patient Care: Yes (Comment) Care giver support system in place?: Yes (comment)   Criminal Activity/Legal Involvement Pertinent to Current Situation/Hospitalization: No - Comment as needed  Activities of Daily Living Home Assistive Devices/Equipment: Electric scooter ADL Screening (condition at time of admission) Patient's cognitive ability adequate to safely complete daily activities?: No Is the patient deaf or have difficulty hearing?: Yes Does the patient have difficulty seeing, even when wearing glasses/contacts?: No Does the patient have difficulty concentrating, remembering, or making decisions?: Yes Patient able to express  need for assistance with ADLs?: Yes Does the patient have difficulty dressing or bathing?: Yes Independently performs ADLs?: No Does the patient have difficulty walking or climbing stairs?: Yes Weakness of Legs: Both Weakness of Arms/Hands: Left  Permission Sought/Granted                  Emotional Assessment       Orientation: : Oriented to Self, Fluctuating Orientation (Suspected and/or reported Sundowners) Alcohol / Substance Use: Not Applicable Psych Involvement: No (comment)  Admission diagnosis:  Rectal bleeding [K62.5] CAP (community acquired pneumonia) [J18.9] Anemia, unspecified type [D64.9] Community acquired pneumonia of left lower lobe of lung [J18.9] Patient Active Problem List   Diagnosis Date Noted   CAP (community acquired pneumonia) 07/20/2022   Pressure injury of skin 07/20/2022   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   Chronic hepatitis C ([place]) 07/20/2022   Protein-calorie malnutrition, severe ([place]) 07/20/2022   Rectal bleeding 07/20/2022   Normocytic anemia 06/28/2022   Hematuria 05/01/2022   History of CVA (cerebrovascular accident) 04/17/2022   BPH (benign prostatic hyperplasia) 04/17/2022   Malnutrition of moderate degree 04/15/2022   AKI (acute kidney injury) ([place]) 04/12/2022   Acute ischemic right PCA stroke ([place]) 03/25/2022   Primary hypertension 03/25/2022   Dysphagia due to recent cerebrovascular accident (CVA) 03/25/2022   Elevated liver enzymes 03/25/2022   Rhabdomyolysis 03/25/2022   Malignant neoplasm of prostate ([place]) 02/20/2021   PCP:  Arthur Holms, NP Pharmacy:   [place], [place] - [address] [place] [address] Phone: [phone] Fax: [phone]     Social Determinants of Health (SDOH) Social History: SDOH Screenings   Food Insecurity: No Food Insecurity (07/20/2022)  Housing: Low Risk  (07/20/2022)  Transportation Needs: No Transportation Needs (07/20/2022)   Utilities: Not At Risk (07/20/2022)  Tobacco Use: High Risk (07/20/2022)   SDOH Interventions:     Readmission Risk Interventions     No data to display

## 2022-07-22 NOTE — Progress Notes (Signed)
Chaplain engaged in an initial visit with Jason Jones and visited with his daughter, Jason Jones, by phone.  Jason Jones would like Jason Jones to complete an Scientist, physiological, Healthcare POA.  Jason Jones was agreeable to this and wants to assign Jason Jones as his healthcare agent.  Chaplain does assess that it would be beneficial for Jason Jones to complete an AD because he has more than one child and a brother, Jason Jones, that is active in his care.  Jason Jones desires to complete the paperwork while Jason Jones is present and with her help.  Chaplain will leave paperwork in the room.  Chaplain let Jason Jones and Jason Jones know how to contact her if they need assistance.   Chaplain Hatley Henegar, MDiv  07/22/22 1100  Spiritual Encounters  Type of Visit Initial  Care provided to: Pt and family  Referral source Family  Reason for visit Advance directives  Spiritual Framework  Presenting Themes Goals in life/care  Community/Connection Family  Intervention Outcomes  Outcomes Connection to spiritual care;Awareness around self/spiritual resourses;Awareness of support  Spiritual Care Plan  Spiritual Care Issues Still Outstanding Chaplain will continue to follow

## 2022-07-23 DIAGNOSIS — J189 Pneumonia, unspecified organism: Secondary | ICD-10-CM | POA: Diagnosis not present

## 2022-07-23 LAB — BASIC METABOLIC PANEL
Anion gap: 10 (ref 5–15)
BUN: 24 mg/dL — ABNORMAL HIGH (ref 8–23)
CO2: 24 mmol/L (ref 22–32)
Calcium: 8.2 mg/dL — ABNORMAL LOW (ref 8.9–10.3)
Chloride: 104 mmol/L (ref 98–111)
Creatinine, Ser: 1.47 mg/dL — ABNORMAL HIGH (ref 0.61–1.24)
GFR, Estimated: 50 mL/min — ABNORMAL LOW (ref >60)
Glucose, Bld: 155 mg/dL — ABNORMAL HIGH (ref 70–99)
Potassium: 3.7 mmol/L (ref 3.5–5.1)
Sodium: 138 mmol/L (ref 135–145)

## 2022-07-23 LAB — CBC
HCT: 29.9 % — ABNORMAL LOW (ref 39.0–52.0)
Hemoglobin: 9.3 g/dL — ABNORMAL LOW (ref 13.0–17.0)
MCH: 26.7 pg (ref 26.0–34.0)
MCHC: 31.1 g/dL (ref 30.0–36.0)
MCV: 85.9 fL (ref 80.0–100.0)
Platelets: 428 10*3/uL — ABNORMAL HIGH (ref 150–400)
RBC: 3.48 MIL/uL — ABNORMAL LOW (ref 4.22–5.81)
RDW: 15.5 % (ref 11.5–15.5)
WBC: 12.2 10*3/uL — ABNORMAL HIGH (ref 4.0–10.5)
nRBC: 0 % (ref 0.0–0.2)

## 2022-07-23 LAB — MAGNESIUM: Magnesium: 2 mg/dL (ref 1.7–2.4)

## 2022-07-23 MED ORDER — POLYETHYLENE GLYCOL 3350 17 G PO PACK
17.0000 g | PACK | Freq: Every day | ORAL | Status: DC
  Administered 2022-07-23: 17 g via ORAL
  Filled 2022-07-23 (×2): qty 1

## 2022-07-23 NOTE — Progress Notes (Addendum)
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  Patient unable to provide information due to dementia.  He was found to have pyuria, hematuria, occult blood positive.  Hemoglobin of 7.  Chest x-ray showed left base consolidation and effusion.  Patient admitted with pneumonia, rectal bleeding, anemia.  Evaluated by GI treated with supportive care.  No plan for colonoscopy.  Rectal bleeding resolved.  Evaluated by CCM for pleural effusion no significant fluid by ultrasound.  Plan to treat for pneumonia.   Plan to transfer to  facility to 2/14 if continues to be stable.  Assessment & Plan:   Principal Problem:   CAP (community acquired pneumonia) Active Problems:   Normocytic anemia   Primary hypertension   History of CVA (cerebrovascular accident)   BPH (benign prostatic hyperplasia)   Pressure injury of skin   Protein-calorie malnutrition, severe (HCC)   Rectal bleeding   1-Community-acquired pneumonia, pleural effusion: -Continue with IV antibiotics  -Covid, RSV, Influenza negative.  -Chest x ray left pleural effusion infiltrate.  Strep Pneumonia negative.  Evaluated by CCM for pleural effusion, no significant fluid on US> plan to treat for PNA>  Report lips swelling today. Ceftriaxone discontinue . Started  Levaquin.  Continue with Levaquin.   2-Normocytic anemia, Rectal bleeding, hematuria.  Iron deficiency anemia Acute blood loss anemia.  GI consulted Received 2  unit PRBC during hospitalization.  Hb increase to 8 post blood transfusion.   No further bleeding, advance diet as tolerated. No colonoscopy planned.   Lips Swelling; Could be allergic reaction to antibiotics.  IV benadryl.  Stop ceftriaxone.  Started  Pepcid.  Received one dose IV steroids.  Resolved.   -UTI in setting of chronic foley catheter. ; hematuria.  Exchange foley.  UA with more than 50 WBC>  IV antibiotics.  Urine growing staph Aureus, MRSA, discussed with ID likely contaminate from chronic  foley catheter.  Blood culture negative.   -AKI: Prior creatinine per records 1.1--0.2 Presents  with a creatinine of 1.5--1.6 Continue with IV fluids.  Improving.   -Hypertension: On doxazosin  -BPH, prostate cancer, Chronic indwelling foley catheter:  Continue with Cardura Foley catheter exchange 2/11.Marland Kitchen   7-Pressure Injury:  Support care, local care.   8-Protein caloric malnutrition severe: Ensure.   Hypokalemia: Replaced.  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  Pressure Injury 07/20/22 Ankle Anterior;Left Unstageable - Full thickness tissue loss in which the base of the injury is covered by slough (yellow, tan, gray, green or brown) and/or eschar (tan, brown or black) in the wound bed. (Active)  07/20/22 1809  Location: Ankle  Location Orientation: Anterior;Left  Staging: Unstageable - Full thickness tissue loss in which the base of the injury is covered by slough (yellow, tan, gray, green or brown) and/or eschar (tan, brown or black) in the wound bed.  Wound Description (Comments):   Present on Admission: Yes  Dressing Type Non adherent;Gauze (Comment);Honey;Other (Comment) 07/22/22 2000     Pressure Injury 07/20/22 Foot Anterior;Left Stage 3 -  Full thickness tissue loss. Subcutaneous fat may be visible but bone,  tendon or muscle are NOT exposed. (Active)  07/20/22 1810  Location: Foot  Location Orientation: Anterior;Left  Staging: Stage 3 -  Full thickness tissue  loss. Subcutaneous fat may be visible but bone, tendon or muscle are NOT exposed.  Wound Description (Comments):   Present on Admission: Yes  Dressing Type Non adherent;Gauze (Comment);Honey;Other (Comment) 07/22/22 2000      Estimated body mass index is 18.56 kg/m as calculated from the following:   Height as of this encounter: 5' 11"$  (1.803 m).   Weight as of this encounter: 60.4 kg.   DVT prophylaxis: SCD Code Status: Full code Family Communication: Daughter over Phone 2/11. 2/13. Disposition Plan:  Status is: Inpatient Remains inpatient appropriate because: management of PNA, UTI> Anemia    Consultants:  GI  Procedures:  None  Antimicrobials:    Subjective: He is feeling better.  Denies pain. Lips swelling resolved.   Objective: Vitals:   07/22/22 1426 07/22/22 2004 07/23/22 0543 07/23/22 1256  BP: (!) 148/63 124/77 (!) 159/76 (!) 149/88  Pulse: 79 85 62 64  Resp: 16 16 16 18  $ Temp: (!) 97.5 F (36.4 C) 97.7 F (36.5 C) 97.9 F (36.6 C) 97.9 F (36.6 C)  TempSrc: Oral Oral Oral Oral  SpO2: 94% 96% 94% 95%  Weight:      Height:        Intake/Output Summary (Last 24 hours) at 07/23/2022 1301 Last data filed at 07/23/2022 1300 Gross per 24 hour  Intake 2865 ml  Output 600 ml  Net 2265 ml    Filed Weights   07/20/22 1512 07/20/22 1833  Weight: 63 kg 60.4 kg    Examination:  General exam: Chronic ill appearing.  Respiratory system: CTA Cardiovascular system: S 1, S 2 RRR Gastrointestinal system: ABS present, soft, nt Central nervous system: alert , dysarthric speech  , chronic left side spastic hemiparesis.  Extremities: No edema   Data Reviewed: I have personally reviewed following labs and imaging studies  CBC: Recent Labs  Lab 07/20/22 1420 07/21/22 0020 07/22/22 0918 07/23/22 0402  WBC 15.4* 17.7* 16.0* 12.2*  NEUTROABS 12.2*  --   --   --   HGB 7.0* 7.3* 8.8* 9.3*  HCT 23.2* 23.7* 28.0* 29.9*  MCV 87.9 86.5 86.7 85.9  PLT  416* 364 381 428*    Basic Metabolic Panel: Recent Labs  Lab 07/20/22 1420 07/21/22 0020 07/22/22 0918 07/23/22 0402  NA 140 140 139 138  K 3.4* 3.6 3.3* 3.7  CL 104 104 107 104  CO2 28 26 25 24  $ GLUCOSE 154* 112* 113* 155*  BUN 33* 35* 25* 24*  CREATININE 1.59* 1.59* 1.60* 1.47*  CALCIUM 8.1* 7.8* 8.2* 8.2*  MG 2.1  --  1.9 2.0  PHOS 3.5  --   --   --     GFR: Estimated Creatinine Clearance: 38.8 mL/min (A) (by C-G formula based on SCr of 1.47 mg/dL (H)). Liver Function Tests: Recent Labs  Lab 07/20/22 1420 07/21/22 0020  AST 25 21  ALT 22 21  ALKPHOS 42 40  BILITOT 0.3 0.4  PROT 7.4 6.8  ALBUMIN 2.0* 1.9*    No results for input(s): "LIPASE", "AMYLASE" in the last 168 hours. No results for input(s): "AMMONIA" in the last 168 hours. Coagulation Profile: Recent Labs  Lab 07/20/22 1420  INR 1.2    Cardiac Enzymes: No results for input(s): "CKTOTAL", "CKMB", "CKMBINDEX", "TROPONINI" in the last 168 hours. BNP (last 3 results) No results for input(s): "PROBNP"  in the last 8760 hours. HbA1C: No results for input(s): "HGBA1C" in the last 72 hours. CBG: No results for input(s): "GLUCAP" in the last 168 hours. Lipid Profile: No results for input(s): "CHOL", "HDL", "LDLCALC", "TRIG", "CHOLHDL", "LDLDIRECT" in the last 72 hours. Thyroid Function Tests: No results for input(s): "TSH", "T4TOTAL", "FREET4", "T3FREE", "THYROIDAB" in the last 72 hours. Anemia Panel: No results for input(s): "VITAMINB12", "FOLATE", "FERRITIN", "TIBC", "IRON", "RETICCTPCT" in the last 72 hours. Sepsis Labs: Recent Labs  Lab 07/20/22 1445  LATICACIDVEN 1.5     Recent Results (from the past 240 hour(s))  Blood Culture (routine x 2)     Status: None (Preliminary result)   Collection Time: 07/20/22  2:45 PM   Specimen: BLOOD RIGHT ARM  Result Value Ref Range Status   Specimen Description   Final    BLOOD RIGHT ARM BOTTLES DRAWN AEROBIC AND ANAEROBIC Performed at [facility], [place] [address] [place], [place] [address]    Special Requests   Final    Blood Culture adequate volume Performed at [facility] [address], [place], [place] [address]    Culture   Final    NO GROWTH 3 DAYS Performed at [facility], [place] [address], [place], [place] [address]    Report Status PENDING  Incomplete  Blood Culture (routine x 2)     Status: None (Preliminary result)   Collection Time: 07/20/22  2:45 PM   Specimen: Right Antecubital; Blood  Result Value Ref Range Status   Specimen Description   Final    RIGHT ANTECUBITAL BOTTLES DRAWN AEROBIC AND ANAEROBIC Performed at [facility] [address] [place], [place] [address]    Special Requests   Final    Blood Culture adequate volume Performed at [facility] [address] [place], [place] [address]    Culture   Final    NO GROWTH 3 DAYS Performed at [facility], [place] [address], [place],  [address]    Report Status PENDING  Incomplete  Resp panel by RT-PCR (RSV, Flu A&B, Covid) Anterior Nasal Swab     Status: None   Collection Time: 07/20/22  2:45 PM   Specimen: Anterior Nasal Swab  Result Value Ref Range Status   SARS Coronavirus 2 by RT PCR NEGATIVE NEGATIVE Final    Comment: (NOTE) SARS-CoV-2 target nucleic acids are NOT DETECTED.  The SARS-CoV-2 RNA is generally detectable in upper respiratory specimens during the acute phase of infection. The lowest concentration of SARS-CoV-2 viral copies this assay can detect is 138 copies/mL. A negative result does not preclude SARS-Cov-2 infection and should not be used as the sole basis for treatment or other patient management decisions. A negative result may occur with  improper specimen collection/handling, submission of specimen other than nasopharyngeal swab, presence of viral mutation(s) within the areas targeted by this assay, and inadequate number of  viral copies(<138 copies/mL). A negative result must be combined with clinical observations, patient history, and epidemiological information. The expected result is Negative.  Fact Sheet for Patients:  EntrepreneurPulse.com.au  Fact Sheet for Healthcare Providers:  IncredibleEmployment.be  This test is no t yet approved or cleared by the [place] FDA and  has been authorized for detection and/or diagnosis of SARS-CoV-2 by FDA under an Emergency Use Authorization (EUA). This EUA will remain  in effect (meaning this test can be used) for the duration of the COVID-19 declaration under Section 564(b)(1) of  the Act, 21 U.S.C.section 360bbb-3(b)(1), unless the authorization is terminated  or revoked sooner.       Influenza A by PCR NEGATIVE NEGATIVE Final   Influenza B by PCR NEGATIVE NEGATIVE Final    Comment: (NOTE) The Xpert Xpress SARS-CoV-2/FLU/RSV plus assay is intended as an aid in the diagnosis of influenza from Nasopharyngeal swab specimens and should not be used as a sole basis for treatment. Nasal washings and aspirates are unacceptable for Xpert Xpress SARS-CoV-2/FLU/RSV testing.  Fact Sheet for Patients: EntrepreneurPulse.com.au  Fact Sheet for Healthcare Providers: IncredibleEmployment.be  This test is not yet approved or cleared by the [place] FDA and has been authorized for detection and/or diagnosis of SARS-CoV-2 by FDA under an Emergency Use Authorization (EUA). This EUA will remain in effect (meaning this test can be used) for the duration of the COVID-19 declaration under Section 564(b)(1) of the Act, 21 U.S.C. section 360bbb-3(b)(1), unless the authorization is terminated or revoked.     Resp Syncytial Virus by PCR NEGATIVE NEGATIVE Final    Comment: (NOTE) Fact Sheet for Patients: EntrepreneurPulse.com.au  Fact Sheet for Healthcare  Providers: IncredibleEmployment.be  This test is not yet approved or cleared by the [place] FDA and has been authorized for detection and/or diagnosis of SARS-CoV-2 by FDA under an Emergency Use Authorization (EUA). This EUA will remain in effect (meaning this test can be used) for the duration of the COVID-19 declaration under Section 564(b)(1) of the Act, 21 U.S.C. section 360bbb-3(b)(1), unless the authorization is terminated or revoked.  Performed at [facility], [place] [address], [place], [place] [address]   Culture, Urine (Do not remove urinary catheter, catheter placed by urology or difficult to place)     Status: Abnormal   Collection Time: 07/20/22  2:49 PM   Specimen: Urine, Catheterized  Result Value Ref Range Status   Specimen Description   Final    URINE, CATHETERIZED Performed at [place] [address], [place], [place] [address]    Special Requests   Final    NONE Performed at [facility], [place] [address], [place], [place] [address]    Culture (A)  Final    >=100,000 COLONIES/mL METHICILLIN RESISTANT STAPHYLOCOCCUS AUREUS >=100,000 COLONIES/mL DIPHTHEROIDS(CORYNEBACTERIUM SPECIES) Standardized susceptibility testing for this organism is not available. Performed at [facility], [place] [address] [place], [place] [address]    Report Status 07/22/2022 FINAL  Final   Organism ID, Bacteria METHICILLIN RESISTANT STAPHYLOCOCCUS AUREUS (A)  Final      Susceptibility   Methicillin resistant staphylococcus aureus - MIC*    CIPROFLOXACIN >=8 RESISTANT Resistant     GENTAMICIN <=0.5 SENSITIVE Sensitive     NITROFURANTOIN <=16 SENSITIVE Sensitive     OXACILLIN >=4 RESISTANT Resistant     TETRACYCLINE <=1 SENSITIVE Sensitive     VANCOMYCIN 1 SENSITIVE Sensitive     TRIMETH/SULFA <=10 SENSITIVE Sensitive     CLINDAMYCIN >=8 RESISTANT Resistant     RIFAMPIN <=0.5 SENSITIVE Sensitive      Inducible Clindamycin NEGATIVE Sensitive     * >=100,000 COLONIES/mL METHICILLIN RESISTANT STAPHYLOCOCCUS AUREUS         Radiology Studies: DG Chest 2 View  Result Date: 07/22/2022 CLINICAL DATA:  Pleural effusion. EXAM: CHEST - 2 VIEW COMPARISON:  07/20/2022 FINDINGS: Patient is rotated towards the LEFT. The heart size is normal. RIGHT lung is clear. There is asymmetry of the hemidiaphragms, LEFT higher and more irregular compared to the RIGHT. Findings raise  the question of subpulmonic effusion on the LEFT. There are no consolidations. Remote LEFT rib fractures are present. IMPRESSION: LEFT pleural effusion. Electronically Signed   By: Nolon Nations M.D.   On: 07/22/2022 08:40        Scheduled Meds:  baclofen  10 mg Oral BID   Chlorhexidine Gluconate Cloth  6 each Topical Daily   doxazosin  1 mg Oral q AM   feeding supplement  237 mL Oral BID BM   leptospermum manuka honey  1 Application Topical Daily   mirtazapine  7.5 mg Oral QHS   pantoprazole (PROTONIX) IV  40 mg Intravenous Daily   rosuvastatin  20 mg Oral QHS   senna-docusate  1 tablet Oral QHS   Continuous Infusions:  dextrose 5% lactated ringers 75 mL/hr at 07/23/22 0609   famotidine (PEPCID) IV 20 mg (07/22/22 1651)   levofloxacin (LEVAQUIN) IV Stopped (07/22/22 2346)     LOS: 3 days    Time spent: 35 minutes.     Elmarie Shiley, MD [facility]   If 7PM-7AM, please contact night-coverage [email or web address]  07/23/2022, 1:01 PM

## 2022-07-23 NOTE — Progress Notes (Addendum)
Patient refused IV insertion when IV team RN arrived to bedside. RN explained to him the importance of the second line, and patient verbalized his understanding. Patient requested to wait to have the IV placed until later.

## 2022-07-23 NOTE — Progress Notes (Signed)
Speech Language Pathology Treatment: Dysphagia  Patient Details Name: Jason Jones MRN: [id number] DOB: 03/14/1951 Today's Date: 07/23/2022 Time: [id number] SLP Time Calculation (min) (ACUTE ONLY): 10 min  Assessment / Plan / Recommendation Clinical Impression  Pt seen today *and familiar to this SLP from prior MBS 04/2022 - where Dys3/thin diet was advised.  He is alert, able to feed himself with his right hand and motivated for dietary advancement.    SLP faciliated session by providing pt with toothbrush/paste/basin for oral care - after which observed him consuming water, ice cream and bites of [place] sandwich.   Pt able to feed himself with right hand - demonstrated rapid rate of eating but with eventual adequate oral clearance.  He did benefit from cues to slow rate -  Obvious trigeminal nerve impairment present evidenced by lack of sensation to mustard on lower left labial region but no oral retention with visualization x3 during session. Pt reports weight loss PTA  due to displeasure with premorbid diet - after SLP mentioned his chart indicated he is on a puree/thin diet at SNF.  To help maximize po intake/pt QOL, recommend advance to dys3/thin diet - with aspiration and esophageal precautions *suspected dysmotility on prior MBS*.  Requested RN document close to his room if able *during meals* to assure tolerance and no overt deficits.    Using teach back, educated pt to recommendations - and to strict need to eat SLOWLY! Today pt is not dysarthric nor dysphonic and is managing his secretions well - good prognostic indicators.  Will follow up closely to assess for po tolerance, instrumental eval indication. Thanks.        HPI HPI: Patient is a 72 y.o. 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 He presented to the ED from SNF with rectal  bleeding. RSV/COVID/Influenza tests negative and CXR showed LEFT pleural effusion. He had MBS during recent past admission (04/16/2022) and at that time, presented with a mild oral and pharyngeal phase of dysphagia with trace penetration of thin liquids but no aspiration and no pharyngeal retention of PO's.  Pt is on a puree/thin diet at his SNF - wants diet advanced currently after being on clears since being done with his endoscopy.      SLP Plan  Continue with current plan of care      Recommendations for follow up therapy are one component of a multi-disciplinary discharge planning process, led by the attending physician.  Recommendations may be updated based on patient status, additional functional criteria and insurance authorization.    Recommendations  Diet recommendations: Dysphagia 3 (mechanical soft);Dysphagia 1 (puree) Liquids provided via: Straw;Cup Medication Administration: Whole meds with puree Supervision: Patient able to self feed Compensations: Slow rate;Small sips/bites;Lingual sweep for clearance of pocketing (check for oral clearance on the left) Postural Changes and/or Swallow Maneuvers: Seated upright 90 degrees;Upright 30-60 min after meal                Oral Care Recommendations: Oral care BID;Staff/trained caregiver to provide oral care Follow Up Recommendations: Skilled nursing-short term rehab (<3 hours/day) Assistance recommended at discharge: Intermittent Supervision/Assistance SLP Visit Diagnosis: Dysphagia, unspecified (R13.10) Plan: Continue with current plan of care          Kathleen Lime, MS [facility] SLP Acute Rehab Services Office [phone]  Macario Golds  07/23/2022, 3:34 PM

## 2022-07-23 NOTE — Progress Notes (Signed)
At bedside to insert PIV, pt. Is confused and uncooperative. Needs a holding help. Be back soon second person to assist is available.

## 2022-07-24 MED ORDER — LEVOFLOXACIN 750 MG PO TABS: 750.0000 mg | ORAL_TABLET | Freq: Every day | ORAL | 0 refills | Status: AC

## 2022-07-24 MED ORDER — OLANZAPINE 5 MG PO TBDP: 5.0000 mg | ORAL_TABLET | Freq: Every evening | ORAL | Status: DC | PRN

## 2022-07-24 MED ORDER — MEDIHONEY WOUND/BURN DRESSING EX PSTE: 1.0000 | PASTE | Freq: Every day | CUTANEOUS | Status: DC

## 2022-07-24 MED ORDER — OLANZAPINE 10 MG IM SOLR
5.0000 mg | Freq: Once | INTRAMUSCULAR | Status: DC | PRN
  Filled 2022-07-24: qty 10

## 2022-07-24 MED ORDER — OLANZAPINE 5 MG PO TBDP
5.0000 mg | ORAL_TABLET | Freq: Every day | ORAL | Status: DC
  Filled 2022-07-24: qty 1

## 2022-07-24 MED ORDER — OLANZAPINE 5 MG PO TBDP: 5.0000 mg | ORAL_TABLET | Freq: Two times a day (BID) | ORAL | Status: DC | PRN

## 2022-07-24 NOTE — Discharge Summary (Addendum)
Physician Discharge Summary  Jason Jones [id number] DOB: 1950-09-30 DOA: 07/20/2022  PCP: Arthur Holms, NP  Admit date: 07/20/2022 Discharge date: 07/24/2022 Discharging to: SNF Recommendations for Outpatient Follow-up:  Provide air mattress Aggressive prevention of decubitus ulcers please Palliative care consult at SNF D 3 diet  Consults:  PCCM Procedures:  none   Discharge Diagnoses:   Principal Problem:   CAP (community acquired pneumonia) Active Problems:    Rectal bleeding   Normocytic anemia   Primary hypertension   History of CVA (cerebrovascular accident)   BPH (benign prostatic hyperplasia)   Pressure injury of skin   Protein-calorie malnutrition, severe [facility])      Hospital Course:  A 72 year old male with a complex medical history including osteoarthritis, asthma, alcohol abuse, GERD, hypertension, nocturia, hematuria, 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 The patient's dementia hindered communication of symptoms. Evaluation revealed pyuria, hematuria, occult blood positivity, with a hemoglobin level of 7 and findings of pneumonia with left basilar consolidation and effusion on chest x-ray, Temp 101. 6. WBC 15.4. Hgb 7.0  Community-acquired pneumonia, minimal left pleural effusion: Left basilar infiltrate Allergic reaction to Ceftriaxone with swelling of lip Negative for Covid, RSV, Influenza, and Strep Pneumonia. Lips swelling suspected to be an allergic reaction to ceftriaxone- discontinued  & Levaquin started Pleural effusion evaluated by CCM with no significant fluid on ultrasound. DC to SNF with Levaquin for total of 7 days  Normocytic anemia, rectal bleeding heme positive stool, hematuria: Diagnosed as iron deficiency anemia and acute blood loss anemia. Received 2 units  PRBC with an increase in Hb to 8 post-transfusion. No further bleeding.  GI has evaluated the patient. No colonoscopy planned.  Pyuria in setting of chronic foley catheter, BPH, Prostate cancer Continue with Cardura. Foley catheter exchanged on 2/11. Urine culture positive for Staph Aureus, MRSA, likely contaminant from chronic foley catheter. Blood culture negative.  Acute Kidney Injury (AKI): Creatinine elevated to 1.5-1.6.  - improved with IVF  Vascular dementia - Olanzapine started BID PRN for intermittent agitation  Hypertension: On doxazosin.   Pressure Injury: Supportive care, local wound care.  Protein caloric malnutrition severe: As evidenced by severe fat and muscle depletion Started Ensure supplementation.  Hypokalemia: Replaced electrolytes.  Pressure Injuries: Left ankle, unstagable and left foot anterior stage 3 pressure injuries  Dressing with non-adherent gauze on top of calcium alginate Continuous monitoring and wound care.   Estimated BMI is 18.56 kg/m Height: 5' 11"$  (1.803 m), Weight: 60.4 kg.      Discharge Instructions  Discharge Instructions     Discharge wound care:   Complete by: As directed    daily cleanse and dry followed by application of leptospermum Manuka honey (MediHoney) topped with dry gauze and secured with Kerlix roll gauze/paper tape. Foot is to be placed into Prevalon boot. A sacral foam is provided for PI prevention.   Increase activity slowly   Complete by: As directed       Allergies as of 07/24/2022       Reactions   Ceftriaxone    Possible lips swelling (dose 07/21/22)    Doxycycline Other (See Comments)   "ALLERGIC," per facility document        Medication List     STOP taking these medications    food thickener Gel Commonly known as: SIMPLYTHICK (NECTAR/LEVEL 2/MILDLY THICK)  TAKE these medications    albuterol (2.5 MG/3ML) 0.083% nebulizer solution Commonly known as: PROVENTIL Take 3 mLs (2.5  mg total) by nebulization every 4 (four) hours as needed for wheezing or shortness of breath.   AQUAPHOR OINTMENT BODY EX Apply 1 application  topically See admin instructions. Apply to the lower legs and feet every shift   aspirin 81 MG chewable tablet Chew 1 tablet (81 mg total) by mouth daily. What changed: when to take this   atenolol 25 MG tablet Commonly known as: TENORMIN Take 12.5-25 mg by mouth See admin instructions. Take 12.5 mg by mouth once a day and an additional 25 mg "as needed for a HR >99 BPM"   B Complex-Biotin-FA Tabs Take 1 tablet by mouth in the morning.   baclofen 10 MG tablet Commonly known as: LIORESAL Take 10 mg by mouth 2 (two) times daily.   CALCIUM ALGINATE EX Apply 1 application  topically See admin instructions. Calcium Alginate-Silver external pad 4"x 5". Apply to left ankle and foot topically every day shift every other day for wound healing.   doxazosin 1 MG tablet Commonly known as: CARDURA Take 1 mg by mouth in the morning.   feeding supplement Liqd Take 237 mLs by mouth 2 (two) times daily between meals.   ferrous gluconate 324 MG tablet Commonly known as: FERGON Take 324 mg by mouth daily with breakfast.   gabapentin 100 MG capsule Commonly known as: NEURONTIN Take 200-300 mg by mouth See admin instructions. Take 200 mg by mouth once a day and 300 mg in the evening   leptospermum manuka honey Pste paste Apply 1 Application topically daily.   levofloxacin 750 MG tablet Commonly known as: Levaquin Take 1 tablet (750 mg total) by mouth daily for 3 days.   mirtazapine 7.5 MG tablet Commonly known as: REMERON Take 7.5 mg by mouth at bedtime.   NON FORMULARY Take 120 mLs by mouth See admin instructions. MedPass 2.0 - Drink 120 ml's by mouth three times a day   OLANZapine zydis 5 MG disintegrating tablet Commonly known as: ZYPREXA Take 1 tablet (5 mg total) by mouth at bedtime as needed.   polyethylene glycol 17 g packet Commonly  known as: MIRALAX / GLYCOLAX Take 17 g by mouth daily as needed. What changed: reasons to take this   rosuvastatin 20 MG tablet Commonly known as: CRESTOR Take 1 tablet (20 mg total) by mouth daily. What changed:  how much to take when to take this additional instructions   senna-docusate 8.6-50 MG tablet Commonly known as: Senokot-S Take 1 tablet by mouth at bedtime.   sodium chloride 0.65 % Soln nasal spray Commonly known as: OCEAN Place 1 spray into both nostrils as needed for congestion.   triamcinolone cream 0.1 % Commonly known as: KENALOG Apply 1 Application topically See admin instructions. Apply to rashes every day and evening shift- right arm, right thigh, left thigh, and any other affected areas               Discharge Care Instructions  (From admission, onward)           Start     Ordered   07/24/22 0000  Discharge wound care:       Comments: daily cleanse and dry followed by application of leptospermum Manuka honey (MediHoney) topped with dry gauze and secured with Kerlix roll gauze/paper tape. Foot is to be placed into Prevalon boot. A sacral foam is provided for PI prevention.  07/24/22 1112                The results of significant diagnostics from this hospitalization (including imaging, microbiology, ancillary and laboratory) are listed below for reference.    DG Chest 2 View  Result Date: 07/22/2022 CLINICAL DATA:  Pleural effusion. EXAM: CHEST - 2 VIEW COMPARISON:  07/20/2022 FINDINGS: Patient is rotated towards the LEFT. The heart size is normal. RIGHT lung is clear. There is asymmetry of the hemidiaphragms, LEFT higher and more irregular compared to the RIGHT. Findings raise the question of subpulmonic effusion on the LEFT. There are no consolidations. Remote LEFT rib fractures are present. IMPRESSION: LEFT pleural effusion. Electronically Signed   By: Nolon Nations M.D.   On: 07/22/2022 08:40   DG Chest Port 1 View  Result Date:  07/20/2022 CLINICAL DATA:  Sepsis EXAM: PORTABLE CHEST 1 VIEW COMPARISON:  Fifteen 3 FINDINGS: Left base consolidation and evidence fusion. Lungs are otherwise clear. No pneumothorax. Calcified ectatic aorta. Unremarkable cardiac silhouette. IMPRESSION: Left base consolidation and effusion. Electronically Signed   By: Sammie Bench M.D.   On: 07/20/2022 15:04   Labs:   Basic Metabolic Panel: Recent Labs  Lab 07/20/22 1420 07/21/22 0020 07/22/22 0918 07/23/22 0402  NA 140 140 139 138  K 3.4* 3.6 3.3* 3.7  CL 104 104 107 104  CO2 28 26 25 24  $ GLUCOSE 154* 112* 113* 155*  BUN 33* 35* 25* 24*  CREATININE 1.59* 1.59* 1.60* 1.47*  CALCIUM 8.1* 7.8* 8.2* 8.2*  MG 2.1  --  1.9 2.0  PHOS 3.5  --   --   --      CBC: Recent Labs  Lab 07/20/22 1420 07/21/22 0020 07/22/22 0918 07/23/22 0402  WBC 15.4* 17.7* 16.0* 12.2*  NEUTROABS 12.2*  --   --   --   HGB 7.0* 7.3* 8.8* 9.3*  HCT 23.2* 23.7* 28.0* 29.9*  MCV 87.9 86.5 86.7 85.9  PLT 416* 364 381 428*         SIGNED:   Debbe Odea, MD  [facility] 07/24/2022, 2:40 PM

## 2022-07-24 NOTE — Care Management Important Message (Signed)
Important Message  Patient Details IM Letter given. Name: Jason Jones MRN: [id number] Date of Birth: 1951/04/27   Medicare Important Message Given:  Yes     Kerin Salen 07/24/2022, 11:47 AM

## 2022-07-24 NOTE — Progress Notes (Signed)
Speech Language Pathology Treatment: Dysphagia  Patient Details Name: Jason Jones MRN: [id number] DOB: [birth year] Today's Date: 07/24/2022 Time: [id number] SLP Time Calculation (min) (ACUTE ONLY): 15 min  Assessment / Plan / Recommendation Clinical Impression  Pt seen to assure po tolerance given diet advancement and assess for instrumental indication as well as family/pt education. Upon viewing into room through window, pt had his feet toward right side of the bed - appearing to try to get out. Cover and sheet were on the floor. Manuela Schwartz, NT, assisted SLP to slide pt up in bed for optimal position for po intake.   Pt required feeding today due to his inability to focus and max cues to masticate and swallow before talking. He is constantly pulling himself to the right side of the bed, verbalizing that he wants a cigarette and wants to go home.  He was willing to consume ice cream and bananas as well as single sip of water only.  SLP placed boluses on his right side of oral cavity*stronger side.    He required total cues to focus on tasks of masticating and swallowing during session. Yesterday he fed himself *albeit rapidly* and today he did not even try. Pt able to converse with SLP yesterday about his swallowing, premorbid displeasure with premorbid pureed diet etc.   At this time, as pt is able to manage advanced diet textures with full supervision - recommend continue po diet with SLP follow up briefly at SNF to address his dysphagia given varying participation/performance. Placed swallow precaution sign in pt's folder for transport.     HPI HPI: Patient is a 72 y.o. 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 He presented to the ED from SNF with rectal bleeding. RSV/COVID/Influenza tests negative and CXR showed LEFT pleural effusion. He had  MBS during recent past admission (04/16/2022) and at that time, presented with a mild oral and pharyngeal phase of dysphagia with trace penetration of thin liquids but no aspiration and no pharyngeal retention of PO's.  Pt is on a puree/thin diet at his SNF - wants diet advanced currently after being on clears since being done with his endoscopy.      SLP Plan  Continue with current plan of care      Recommendations for follow up therapy are one component of a multi-disciplinary discharge planning process, led by the attending physician.  Recommendations may be updated based on patient status, additional functional criteria and insurance authorization.    Recommendations  Diet recommendations: Thin liquid;Dysphagia 3 (mechanical soft) Liquids provided via: Straw;Cup Medication Administration: Whole meds with puree Supervision: Patient able to self feed Compensations: Slow rate;Small sips/bites;Lingual sweep for clearance of pocketing (check for oral clearance on the left) Postural Changes and/or Swallow Maneuvers: Seated upright 90 degrees;Upright 30-60 min after meal                Oral Care Recommendations: Oral care BID;Staff/trained caregiver to provide oral care Follow Up Recommendations: Skilled nursing-short term rehab (<3 hours/day) Assistance recommended at discharge: Intermittent Supervision/Assistance SLP Visit Diagnosis: Dysphagia, unspecified (R13.10) Plan: Continue with current plan of care         Kathleen Lime, MS [facility] SLP Acute Rehab Services Office [phone]   Macario Golds  07/24/2022, 5:21 PM

## 2022-07-24 NOTE — TOC Transition Note (Incomplete)
Transition of Care [facility]) - CM/SW Discharge Note   Patient Details  Name: Jason Jones MRN: [id number] Date of Birth: [birth year]  Transition of Care [facility]) CM/SW Contact:  Illene Regulus, LCSW Phone Number: 07/24/2022, 11:45 AM   Clinical Narrative:    CSW spoke with pt's daughter she is requesting EMS transport. CSW will arrange PTAR for transport back to [place].       Barriers to Discharge: Continued Medical Work up   Patient Goals and CMS Choice      Discharge Placement                         Discharge Plan and Services Additional resources added to the After Visit Summary for                                       Social Determinants of Health (SDOH) Interventions SDOH Screenings   Food Insecurity: No Food Insecurity (07/20/2022)  Housing: Low Risk  (07/20/2022)  Transportation Needs: No Transportation Needs (07/20/2022)  Utilities: Not At Risk (07/20/2022)  Tobacco Use: High Risk (07/20/2022)     Readmission Risk Interventions     No data to display

## 2022-07-25 LAB — CULTURE, BLOOD (ROUTINE X 2)
Culture: NO GROWTH
Culture: NO GROWTH
Special Requests: ADEQUATE
Special Requests: ADEQUATE

## 2022-07-30 ENCOUNTER — Inpatient Hospital Stay (HOSPITAL_COMMUNITY)
Admission: EM | Admit: 2022-07-30 | Discharge: 2022-08-05 | DRG: 698 | Disposition: A | Discharge status: Skilled Nursing Facility | Payer: 59 | Source: Skilled Nursing Facility | Attending: Internal Medicine | Admitting: Internal Medicine

## 2022-07-30 ENCOUNTER — Emergency Department (HOSPITAL_COMMUNITY): Payer: 59

## 2022-07-30 ENCOUNTER — Other Ambulatory Visit: Payer: Self-pay

## 2022-07-30 DIAGNOSIS — F03B11 Unspecified dementia, moderate, with agitation: Secondary | ICD-10-CM | POA: Diagnosis not present

## 2022-07-30 DIAGNOSIS — N136 Pyonephrosis: Secondary | ICD-10-CM | POA: Diagnosis present

## 2022-07-30 DIAGNOSIS — Z681 Body mass index (BMI) 19 or less, adult: Secondary | ICD-10-CM

## 2022-07-30 DIAGNOSIS — E440 Moderate protein-calorie malnutrition: Secondary | ICD-10-CM | POA: Diagnosis present

## 2022-07-30 DIAGNOSIS — Z66 Do not resuscitate: Secondary | ICD-10-CM | POA: Diagnosis present

## 2022-07-30 DIAGNOSIS — C61 Malignant neoplasm of prostate: Secondary | ICD-10-CM | POA: Diagnosis not present

## 2022-07-30 DIAGNOSIS — F17210 Nicotine dependence, cigarettes, uncomplicated: Secondary | ICD-10-CM | POA: Diagnosis present

## 2022-07-30 DIAGNOSIS — L89893 Pressure ulcer of other site, stage 3: Secondary | ICD-10-CM | POA: Diagnosis present

## 2022-07-30 DIAGNOSIS — Y846 Urinary catheterization as the cause of abnormal reaction of the patient, or of later complication, without mention of misadventure at the time of the procedure: Secondary | ICD-10-CM | POA: Diagnosis present

## 2022-07-30 DIAGNOSIS — T83091A Other mechanical complication of indwelling urethral catheter, initial encounter: Secondary | ICD-10-CM | POA: Diagnosis present

## 2022-07-30 DIAGNOSIS — Z923 Personal history of irradiation: Secondary | ICD-10-CM

## 2022-07-30 DIAGNOSIS — B9562 Methicillin resistant Staphylococcus aureus infection as the cause of diseases classified elsewhere: Secondary | ICD-10-CM | POA: Diagnosis present

## 2022-07-30 DIAGNOSIS — R339 Retention of urine, unspecified: Secondary | ICD-10-CM

## 2022-07-30 DIAGNOSIS — R338 Other retention of urine: Secondary | ICD-10-CM | POA: Diagnosis present

## 2022-07-30 DIAGNOSIS — I69354 Hemiplegia and hemiparesis following cerebral infarction affecting left non-dominant side: Secondary | ICD-10-CM

## 2022-07-30 DIAGNOSIS — N400 Enlarged prostate without lower urinary tract symptoms: Secondary | ICD-10-CM | POA: Diagnosis present

## 2022-07-30 DIAGNOSIS — Z79899 Other long term (current) drug therapy: Secondary | ICD-10-CM

## 2022-07-30 DIAGNOSIS — N1330 Unspecified hydronephrosis: Secondary | ICD-10-CM | POA: Diagnosis not present

## 2022-07-30 DIAGNOSIS — N401 Enlarged prostate with lower urinary tract symptoms: Secondary | ICD-10-CM | POA: Diagnosis present

## 2022-07-30 DIAGNOSIS — N390 Urinary tract infection, site not specified: Secondary | ICD-10-CM

## 2022-07-30 DIAGNOSIS — F0152 Vascular dementia, unspecified severity, with psychotic disturbance: Secondary | ICD-10-CM | POA: Diagnosis not present

## 2022-07-30 DIAGNOSIS — D72829 Elevated white blood cell count, unspecified: Secondary | ICD-10-CM | POA: Insufficient documentation

## 2022-07-30 DIAGNOSIS — I69398 Other sequelae of cerebral infarction: Secondary | ICD-10-CM

## 2022-07-30 DIAGNOSIS — K219 Gastro-esophageal reflux disease without esophagitis: Secondary | ICD-10-CM | POA: Diagnosis present

## 2022-07-30 DIAGNOSIS — E876 Hypokalemia: Secondary | ICD-10-CM | POA: Diagnosis not present

## 2022-07-30 DIAGNOSIS — Z1152 Encounter for screening for COVID-19: Secondary | ICD-10-CM | POA: Diagnosis not present

## 2022-07-30 DIAGNOSIS — G9341 Metabolic encephalopathy: Secondary | ICD-10-CM | POA: Diagnosis present

## 2022-07-30 DIAGNOSIS — T83511A Infection and inflammatory reaction due to indwelling urethral catheter, initial encounter: Secondary | ICD-10-CM | POA: Diagnosis present

## 2022-07-30 DIAGNOSIS — J45909 Unspecified asthma, uncomplicated: Secondary | ICD-10-CM | POA: Diagnosis present

## 2022-07-30 DIAGNOSIS — N179 Acute kidney failure, unspecified: Secondary | ICD-10-CM | POA: Diagnosis present

## 2022-07-30 DIAGNOSIS — F01518 Vascular dementia, unspecified severity, with other behavioral disturbance: Secondary | ICD-10-CM | POA: Diagnosis present

## 2022-07-30 DIAGNOSIS — Z881 Allergy status to other antibiotic agents status: Secondary | ICD-10-CM

## 2022-07-30 DIAGNOSIS — Z7189 Other specified counseling: Secondary | ICD-10-CM | POA: Diagnosis not present

## 2022-07-30 DIAGNOSIS — N138 Other obstructive and reflux uropathy: Secondary | ICD-10-CM | POA: Diagnosis present

## 2022-07-30 DIAGNOSIS — Z8546 Personal history of malignant neoplasm of prostate: Secondary | ICD-10-CM

## 2022-07-30 DIAGNOSIS — M1990 Unspecified osteoarthritis, unspecified site: Secondary | ICD-10-CM | POA: Diagnosis present

## 2022-07-30 DIAGNOSIS — F01C18 Vascular dementia, severe, with other behavioral disturbance: Secondary | ICD-10-CM | POA: Diagnosis not present

## 2022-07-30 DIAGNOSIS — Z8042 Family history of malignant neoplasm of prostate: Secondary | ICD-10-CM

## 2022-07-30 DIAGNOSIS — Z7401 Bed confinement status: Secondary | ICD-10-CM

## 2022-07-30 DIAGNOSIS — L89520 Pressure ulcer of left ankle, unstageable: Secondary | ICD-10-CM | POA: Diagnosis present

## 2022-07-30 DIAGNOSIS — I10 Essential (primary) hypertension: Secondary | ICD-10-CM | POA: Diagnosis present

## 2022-07-30 DIAGNOSIS — Z8701 Personal history of pneumonia (recurrent): Secondary | ICD-10-CM

## 2022-07-30 DIAGNOSIS — D649 Anemia, unspecified: Secondary | ICD-10-CM | POA: Diagnosis present

## 2022-07-30 DIAGNOSIS — M6282 Rhabdomyolysis: Secondary | ICD-10-CM | POA: Diagnosis present

## 2022-07-30 DIAGNOSIS — Z7982 Long term (current) use of aspirin: Secondary | ICD-10-CM

## 2022-07-30 DIAGNOSIS — R748 Abnormal levels of other serum enzymes: Secondary | ICD-10-CM | POA: Diagnosis present

## 2022-07-30 LAB — COMPREHENSIVE METABOLIC PANEL
ALT: 21 U/L (ref 0–44)
AST: 19 U/L (ref 15–41)
Albumin: 2.4 g/dL — ABNORMAL LOW (ref 3.5–5.0)
Alkaline Phosphatase: 49 U/L (ref 38–126)
Anion gap: 12 (ref 5–15)
BUN: 59 mg/dL — ABNORMAL HIGH (ref 8–23)
CO2: 24 mmol/L (ref 22–32)
Calcium: 8.4 mg/dL — ABNORMAL LOW (ref 8.9–10.3)
Chloride: 104 mmol/L (ref 98–111)
Creatinine, Ser: 4.7 mg/dL — ABNORMAL HIGH (ref 0.61–1.24)
GFR, Estimated: 12 mL/min — ABNORMAL LOW (ref >60)
Glucose, Bld: 123 mg/dL — ABNORMAL HIGH (ref 70–99)
Potassium: 3.8 mmol/L (ref 3.5–5.1)
Sodium: 140 mmol/L (ref 135–145)
Total Bilirubin: 0.4 mg/dL (ref 0.3–1.2)
Total Protein: 8.3 g/dL — ABNORMAL HIGH (ref 6.5–8.1)

## 2022-07-30 LAB — CBC
HCT: 31.3 % — ABNORMAL LOW (ref 39.0–52.0)
Hemoglobin: 9.6 g/dL — ABNORMAL LOW (ref 13.0–17.0)
MCH: 27.1 pg (ref 26.0–34.0)
MCHC: 30.7 g/dL (ref 30.0–36.0)
MCV: 88.4 fL (ref 80.0–100.0)
Platelets: 371 10*3/uL (ref 150–400)
RBC: 3.54 MIL/uL — ABNORMAL LOW (ref 4.22–5.81)
RDW: 16.7 % — ABNORMAL HIGH (ref 11.5–15.5)
WBC: 18.5 10*3/uL — ABNORMAL HIGH (ref 4.0–10.5)
nRBC: 0 % (ref 0.0–0.2)

## 2022-07-30 LAB — CBC WITH DIFFERENTIAL/PLATELET
Abs Immature Granulocytes: 0.09 10*3/uL — ABNORMAL HIGH (ref 0.00–0.07)
Basophils Absolute: 0 10*3/uL (ref 0.0–0.1)
Basophils Relative: 0 %
Eosinophils Absolute: 0.2 10*3/uL (ref 0.0–0.5)
Eosinophils Relative: 1 %
HCT: 32.1 % — ABNORMAL LOW (ref 39.0–52.0)
Hemoglobin: 9.7 g/dL — ABNORMAL LOW (ref 13.0–17.0)
Immature Granulocytes: 1 %
Lymphocytes Relative: 7 %
Lymphs Abs: 1.1 10*3/uL (ref 0.7–4.0)
MCH: 26.2 pg (ref 26.0–34.0)
MCHC: 30.2 g/dL (ref 30.0–36.0)
MCV: 86.8 fL (ref 80.0–100.0)
Monocytes Absolute: 0.8 10*3/uL (ref 0.1–1.0)
Monocytes Relative: 5 %
Neutro Abs: 13.3 10*3/uL — ABNORMAL HIGH (ref 1.7–7.7)
Neutrophils Relative %: 86 %
Platelets: 463 10*3/uL — ABNORMAL HIGH (ref 150–400)
RBC: 3.7 MIL/uL — ABNORMAL LOW (ref 4.22–5.81)
RDW: 16.8 % — ABNORMAL HIGH (ref 11.5–15.5)
WBC: 15.5 10*3/uL — ABNORMAL HIGH (ref 4.0–10.5)
nRBC: 0 % (ref 0.0–0.2)

## 2022-07-30 LAB — PROTIME-INR
INR: 1.2 (ref 0.8–1.2)
Prothrombin Time: 15.4 seconds — ABNORMAL HIGH (ref 11.4–15.2)

## 2022-07-30 LAB — URINALYSIS, ROUTINE W REFLEX MICROSCOPIC
Bilirubin Urine: NEGATIVE
Glucose, UA: NEGATIVE mg/dL
Ketones, ur: NEGATIVE mg/dL
Nitrite: NEGATIVE
Protein, ur: 100 mg/dL — AB
RBC / HPF: >50 RBC/hpf (ref 0–5)
Specific Gravity, Urine: 1.012 (ref 1.005–1.030)
WBC, UA: >50 WBC/hpf (ref 0–5)
pH: 5 (ref 5.0–8.0)

## 2022-07-30 LAB — RESP PANEL BY RT-PCR (RSV, FLU A&B, COVID)  RVPGX2
Influenza A by PCR: NEGATIVE
Influenza B by PCR: NEGATIVE
Resp Syncytial Virus by PCR: NEGATIVE
SARS Coronavirus 2 by RT PCR: NEGATIVE

## 2022-07-30 LAB — LACTIC ACID, PLASMA
Lactic Acid, Venous: 1.1 mmol/L (ref 0.5–1.9)
Lactic Acid, Venous: 1.1 mmol/L (ref 0.5–1.9)

## 2022-07-30 LAB — CREATININE, SERUM
Creatinine, Ser: 3.68 mg/dL — ABNORMAL HIGH (ref 0.61–1.24)
GFR, Estimated: 17 mL/min — ABNORMAL LOW (ref >60)

## 2022-07-30 LAB — TROPONIN I (HIGH SENSITIVITY): Troponin I (High Sensitivity): 7 ng/L (ref <18)

## 2022-07-30 LAB — AMMONIA: Ammonia: 20 umol/L (ref 9–35)

## 2022-07-30 MED ORDER — SODIUM CHLORIDE 0.9 % IV SOLN
2.0000 g | Freq: Once | INTRAVENOUS | Status: AC
  Administered 2022-07-30: 2 g via INTRAVENOUS
  Filled 2022-07-30: qty 10

## 2022-07-30 MED ORDER — VANCOMYCIN HCL IN DEXTROSE 1-5 GM/200ML-% IV SOLN
1000.0000 mg | Freq: Once | INTRAVENOUS | Status: AC
  Administered 2022-07-30: 1000 mg via INTRAVENOUS
  Filled 2022-07-30: qty 200

## 2022-07-30 MED ORDER — LACTATED RINGERS IV SOLN: INTRAVENOUS | Status: AC

## 2022-07-30 MED ORDER — LACTATED RINGERS IV BOLUS (SEPSIS)
1000.0000 mL | Freq: Once | INTRAVENOUS | Status: AC
  Administered 2022-07-30: 1000 mL via INTRAVENOUS

## 2022-07-30 MED ORDER — HEPARIN SODIUM (PORCINE) 5000 UNIT/ML IJ SOLN
5000.0000 [IU] | Freq: Three times a day (TID) | INTRAMUSCULAR | Status: DC
  Administered 2022-07-30 – 2022-08-05 (×17): 5000 [IU] via SUBCUTANEOUS
  Filled 2022-07-30 (×17): qty 1

## 2022-07-30 MED ORDER — METRONIDAZOLE 500 MG/100ML IV SOLN
500.0000 mg | Freq: Once | INTRAVENOUS | Status: AC
  Administered 2022-07-30: 500 mg via INTRAVENOUS
  Filled 2022-07-30: qty 100

## 2022-07-30 MED ORDER — SODIUM CHLORIDE 0.9 % IV SOLN: INTRAVENOUS | Status: DC

## 2022-07-30 MED ORDER — SODIUM CHLORIDE 0.9 % IV SOLN
2.0000 g | Freq: Once | INTRAVENOUS | Status: DC
  Filled 2022-07-30: qty 10

## 2022-07-30 NOTE — Progress Notes (Signed)
Elink following sepsis bundle. °

## 2022-07-30 NOTE — Progress Notes (Signed)
Unable to do admission.  Brother Blanch Media is at bedside. He states he does some of the deciding but patients daughter is the main decision taker.  Brother Blanch Media states he visits his brother all the time.

## 2022-07-30 NOTE — Progress Notes (Signed)
A consult was received from an ED physician for vanc/aztreonam per pharmacy dosing.  The patient's profile has been reviewed for ht/wt/allergies/indication/available labs.   A one time order has been placed for vanc 1g and aztreonam 2g.  Further antibiotics/pharmacy consults should be ordered by admitting physician if indicated.                       Thank you, Kara Mead 07/30/2022  12:22 PM

## 2022-07-30 NOTE — H&P (Signed)
History and Physical    Jason Jones [id number] DOB: 08-13-1950 DOA: 07/30/2022  PCP: Arthur Holms, NP Patient coming from: [place]  Chief Complaint: Acute change in mental status  HPI: Jason Jones is a 72 y.o. male with medical history significant of prior stroke with residual left upper and lower extremity weakness, dementia, prostate cancer, chronic indwelling Foley catheter.  Patient had stroke in November 2023 since then he has been living at [facility] as a long-term care resident.  Discussed with daughter, per daughter patient's baseline is he talks he eats he is bedbound.  For the last few days patient has not been eating or drinking well and has not been talking his baseline.  He is on a dysphagia 3 diet.  Nursing home sent him out to the hospital for concerns of possible new stroke versus sepsis.  Patient also had reportedly had fever at the nursing home.  History obtained from the ER records and the patient's daughter.  Patient not able to provide any history.  No chest pain shortness of breath or cough noted.  No nausea vomiting diarrhea noted. Urine in the Foley cloudy appearing. Patient was discharged from the hospital 07/24/2022 when he was admitted with.  Pneumonia fever of 101.6 with leukocytosis at that time.  He was treated with Levaquin.  PCCM was consulted at that time for pleural effusion who recommended to discharge to nursing home with Levaquin for total of 7 days which should have finished today.  Patient had swelling of his lips from ceftriaxone in his last hospital admission. His past medical history significant for asthma, GERD, hypertension, history of stroke, hepatitis C, vascular dementia, AKI.  On admission in the ER his labs were significant for man of 59 creatinine 4.70 sodium 140 potassium 3.8 albumin is 2.4 GFR 12 WBC 15.5, at the time of discharge his WBC count was 12.2 on 07/23/2022 CT head revealed no acute findings His Foley catheter was changed on  07/21/2022 Chest x-ray no active disease UA shows large amount of leukocytes with proteins bacteria and more than 50 WBC. ED Course: He received IV fluids, vancomycin, Flagyl, aztreonam.  Review of Systems: As per HPI otherwise all other systems reviewed and are negative  Ambulatory Status: Bedbound at baseline  Past Medical History:  Diagnosis Date   Arthritis    Asthma    Cancer ([place])    Chronic alcohol abuse    Dyspnea    GERD (gastroesophageal reflux disease)    PRN  ---  TAKES BAKING SODA IN WATER   History of pneumothorax    04-28-2006  fell, left fx rib--  resolved w/ chest tube   Hypertension    Nocturia    Poor dental hygiene    Right inguinal hernia    Weak urinary stream     Past Surgical History:  Procedure Laterality Date   COLONOSCOPY  01/08/2021   GOLD SEED IMPLANT N/A 05/23/2021   Procedure: GOLD SEED IMPLANT;  Surgeon: Ceasar Mons, MD;  Location: [facility];  Service: Urology;  Laterality: N/A;   INGUINAL HERNIA REPAIR Right 04/23/2017   Procedure: OPEN RIGHT INGUINAL HERNIA REPAIR WITH MESH;  Surgeon: Kinsinger, Arta Bruce, MD;  Location: [facility];  Service: General;  Laterality: Right;  GENERAL COMBINED WITH REGIONAL FOR POST OP PAIN    INSERTION OF MESH Right 04/23/2017   Procedure: INSERTION OF MESH;  Surgeon: Kinsinger, Arta Bruce, MD;  Location: [facility];  Service: General;  Laterality: Right;  GENERAL COMBINED WITH REGIONAL FOR POST OP PAIN    LIPOMA EXCISION Right 02/22/2015   Procedure: RIGHT SHOULDER MASS EXCISION 15 CM SQ;  Surgeon: Arta Bruce Kinsinger, MD;  Location: [facility];  Service: General;  Laterality: Right;   SPACE OAR INSTILLATION N/A 05/23/2021   Procedure: SPACE OAR INSTILLATION;  Surgeon: Ceasar Mons, MD;  Location: [facility];  Service: Urology;  Laterality: N/A;   TRANSRECTAL ULTRASOUND N/A 05/23/2021   Procedure: TRANSRECTAL ULTRASOUND;  Surgeon: Ceasar Mons, MD;  Location: [facility];  Service: Urology;  Laterality: N/A;    Social History   Socioeconomic History   Marital status: Divorced    Spouse name: Not on file   Number of children: Not on file   Years of education: Not on file   Highest education level: Not on file  Occupational History   Not on file  Tobacco Use   Smoking status: Every Day    Packs/day: 0.50    Years: 50.00    Total pack years: 25.00    Types: Cigarettes   Smokeless tobacco: Never  Vaping Use   Vaping Use: Never used  Substance and Sexual Activity   Alcohol use: Not Currently    Comment: last use 2-3 months ago ,   Drug use: Not Currently    Types: Marijuana    Comment: last use 2 years ago per pt   Sexual activity: Not on file  Other Topics Concern   Not on file  Social History Narrative   Not on file   Social Determinants of Health   Financial Resource Strain: Not on file  Food Insecurity: No Food Insecurity (07/20/2022)   Hunger Vital Sign    Worried About Running Out of Food in the Last Year: Never true    Ran Out of Food in the Last Year: Never true  Transportation Needs: No Transportation Needs (07/20/2022)   PRAPARE - Hydrologist (Medical): No    Lack of Transportation (Non-Medical): No  Physical Activity: Not on file  Stress: Not on file  Social Connections: Not on file  Intimate Partner Violence: Not At Risk (07/20/2022)   Humiliation, Afraid, Rape, and Kick questionnaire    Fear of Current or Ex-Partner: No    Emotionally Abused: No    Physically Abused: No    Sexually Abused: No    Allergies  Allergen Reactions   Ceftriaxone     Possible lips swelling (dose 07/21/22)    Doxycycline Other (See Comments)    "ALLERGIC," per facility document    Family History  Problem Relation Age of Onset   Prostate cancer Cousin    Prostate cancer Cousin      Prior to Admission medications   Medication Sig Start Date End Date Taking?  Authorizing Provider  albuterol (PROVENTIL) (2.5 MG/3ML) 0.083% nebulizer solution Take 3 mLs (2.5 mg total) by nebulization every 4 (four) hours as needed for wheezing or shortness of breath. 03/28/22   Nani Gasser, MD  aspirin 81 MG chewable tablet Chew 1 tablet (81 mg total) by mouth daily. Patient taking differently: Chew 81 mg by mouth in the morning. 03/29/22   Nani Gasser, MD  atenolol (TENORMIN) 25 MG tablet Take 12.5-25 mg by mouth See admin instructions. Take 12.5 mg by mouth once a day and an additional 25 mg "as needed for a HR >99 BPM"    [provider]  B Complex-Biotin-FA TABS Take 1 tablet by mouth  in the morning.    [provider]  baclofen (LIORESAL) 10 MG tablet Take 10 mg by mouth 2 (two) times daily.    [provider]  CALCIUM ALGINATE EX Apply 1 application  topically See admin instructions. Calcium Alginate-Silver external pad 4"x 5". Apply to left ankle and foot topically every day shift every other day for wound healing.    [provider]  doxazosin (CARDURA) 1 MG tablet Take 1 mg by mouth in the morning.    [provider]  Emollient (AQUAPHOR OINTMENT BODY EX) Apply 1 application  topically See admin instructions. Apply to the lower legs and feet every shift    [provider]  feeding supplement (ENSURE ENLIVE / ENSURE PLUS) LIQD Take 237 mLs by mouth 2 (two) times daily between meals. Patient not taking: Reported on 07/20/2022 03/28/22   Nani Gasser, MD  ferrous gluconate (FERGON) 324 MG tablet Take 324 mg by mouth daily with breakfast. 07/02/22 08/31/22  [provider]  gabapentin (NEURONTIN) 100 MG capsule Take 200-300 mg by mouth See admin instructions. Take 200 mg by mouth once a day and 300 mg in the evening    [provider]  leptospermum manuka honey (MEDIHONEY) PSTE paste Apply 1 Application topically daily. 07/24/22   Debbe Odea, MD  mirtazapine (REMERON) 7.5 MG tablet  Take 7.5 mg by mouth at bedtime.    [provider]  NON FORMULARY Take 120 mLs by mouth See admin instructions. MedPass 2.0 - Drink 120 ml's by mouth three times a day    [provider]  OLANZapine zydis (ZYPREXA) 5 MG disintegrating tablet Take 1 tablet (5 mg total) by mouth 2 (two) times daily as needed. 07/24/22   Debbe Odea, MD  polyethylene glycol (MIRALAX / GLYCOLAX) 17 g packet Take 17 g by mouth daily as needed. Patient taking differently: Take 17 g by mouth daily as needed (for constipation). 04/16/22   Hosie Poisson, MD  rosuvastatin (CRESTOR) 20 MG tablet Take 1 tablet (20 mg total) by mouth daily. Patient taking differently: Take 10-20 mg by mouth See admin instructions. Take 20 mg by mouth at bedtime every other night, alternating with the 10 mg strength 03/29/22   Nani Gasser, MD  senna-docusate (SENOKOT-S) 8.6-50 MG tablet Take 1 tablet by mouth at bedtime.    [provider]  sodium chloride (OCEAN) 0.65 % SOLN nasal spray Place 1 spray into both nostrils as needed for congestion. 04/16/22   Hosie Poisson, MD  triamcinolone cream (KENALOG) 0.1 % Apply 1 Application topically See admin instructions. Apply to rashes every day and evening shift- right arm, right thigh, left thigh, and any other affected areas    [provider]    Physical Exam: Vitals:   07/30/22 1002 07/30/22 1019 07/30/22 1230  BP: 133/75  128/74  Pulse: 84  79  Resp: 20  11  Temp: 98.9 F (37.2 C) 99.8 F (37.7 C)   TempSrc: Oral Rectal   SpO2: 92%  95%     General:  Appears in no acute distress chronically ill-appearing male Right upper extremity shaking eyes:  PERRL, EOMI, normal lids ENT:  grossly normal hearing, lips & tongue, mucous members is dry Neck:  no LAD, masses or thyromegaly Cardiovascular:  RRR, no m/r/g. No LE edema.  Respiratory:  CTA bilaterally, no w/r/r. Normal respiratory effort. Abdomen:  soft, ntnd, NABS Psychiatric: unAble to  assess Neurologic: Left upper and lower extremity hemiplegia left upper extremity contracture  Labs on Admission: I have personally reviewed following labs and imaging studies  CBC: Recent Labs  Lab 07/30/22 1007  WBC 15.5*  NEUTROABS 13.3*  HGB 9.7*  HCT 32.1*  MCV 86.8  PLT [id number]*   Basic Metabolic Panel: Recent Labs  Lab 07/30/22 1007  NA 140  K 3.8  CL 104  CO2 24  GLUCOSE 123*  BUN 59*  CREATININE 4.70*  CALCIUM 8.4*   GFR: Estimated Creatinine Clearance: 12.1 mL/min (A) (by C-G formula based on SCr of 4.7 mg/dL (H)). Liver Function Tests: Recent Labs  Lab 07/30/22 1007  AST 19  ALT 21  ALKPHOS 49  BILITOT 0.4  PROT 8.3*  ALBUMIN 2.4*   No results for input(s): "LIPASE", "AMYLASE" in the last 168 hours. Recent Labs  Lab 07/30/22 1011  AMMONIA 20   Coagulation Profile: Recent Labs  Lab 07/30/22 1007  INR 1.2   Cardiac Enzymes: No results for input(s): "CKTOTAL", "CKMB", "CKMBINDEX", "TROPONINI" in the last 168 hours. BNP (last 3 results) No results for input(s): "PROBNP" in the last 8760 hours. HbA1C: No results for input(s): "HGBA1C" in the last 72 hours. CBG: No results for input(s): "GLUCAP" in the last 168 hours. Lipid Profile: No results for input(s): "CHOL", "HDL", "LDLCALC", "TRIG", "CHOLHDL", "LDLDIRECT" in the last 72 hours. Thyroid Function Tests: No results for input(s): "TSH", "T4TOTAL", "FREET4", "T3FREE", "THYROIDAB" in the last 72 hours. Anemia Panel: No results for input(s): "VITAMINB12", "FOLATE", "FERRITIN", "TIBC", "IRON", "RETICCTPCT" in the last 72 hours. Urine analysis:    Component Value Date/Time   COLORURINE YELLOW 07/20/2022 1420   APPEARANCEUR TURBID (A) 07/20/2022 1420   LABSPEC 1.013 07/20/2022 1420   PHURINE 6.0 07/20/2022 1420   GLUCOSEU NEGATIVE 07/20/2022 1420   HGBUR LARGE (A) 07/20/2022 1420   BILIRUBINUR NEGATIVE 07/20/2022 1420   KETONESUR NEGATIVE 07/20/2022 1420   PROTEINUR 100 (A) 07/20/2022 1420    UROBILINOGEN 0.2 03/16/2008 0733   NITRITE NEGATIVE 07/20/2022 1420   LEUKOCYTESUR LARGE (A) 07/20/2022 1420    Creatinine Clearance: Estimated Creatinine Clearance: 12.1 mL/min (A) (by C-G formula based on SCr of 4.7 mg/dL (H)).  Sepsis Labs: @LABRCNTIP$ (procalcitonin:4,lacticidven:4) ) Recent Results (from the past 240 hour(s))  Blood Culture (routine x 2)     Status: None   Collection Time: 07/20/22  2:45 PM   Specimen: BLOOD RIGHT ARM  Result Value Ref Range Status   Specimen Description   Final    BLOOD RIGHT ARM BOTTLES DRAWN AEROBIC AND ANAEROBIC Performed at [facility], [place] [address] [place], [place] [address]    Special Requests   Final    Blood Culture adequate volume Performed at [place] [address] [place], [place] [address]    Culture   Final    NO GROWTH 5 DAYS Performed at [facility], [place] [address], [place], [place] [address]    Report Status 07/25/2022 FINAL  Final  Blood Culture (routine x 2)     Status: None   Collection Time: 07/20/22  2:45 PM   Specimen: Right Antecubital; Blood  Result Value Ref Range Status   Specimen Description   Final    RIGHT ANTECUBITAL BOTTLES DRAWN AEROBIC AND ANAEROBIC Performed at [place] [address], [place], [place] [address]    Special Requests   Final    Blood Culture adequate volume Performed at [place] [address], [place], [place] [address]    Culture   Final  NO GROWTH 5 DAYS Performed at [facility], [place] [address], [place], [place] [address]    Report Status 07/25/2022 FINAL  Final  Resp panel by RT-PCR (RSV, Flu A&B, Covid) Anterior Nasal Swab     Status: None   Collection Time: 07/20/22  2:45 PM   Specimen: Anterior Nasal Swab  Result Value Ref Range Status   SARS Coronavirus 2 by RT PCR NEGATIVE NEGATIVE Final    Comment: (NOTE) SARS-CoV-2 target nucleic acids are NOT  DETECTED.  The SARS-CoV-2 RNA is generally detectable in upper respiratory specimens during the acute phase of infection. The lowest concentration of SARS-CoV-2 viral copies this assay can detect is 138 copies/mL. A negative result does not preclude SARS-Cov-2 infection and should not be used as the sole basis for treatment or other patient management decisions. A negative result may occur with  improper specimen collection/handling, submission of specimen other than nasopharyngeal swab, presence of viral mutation(s) within the areas targeted by this assay, and inadequate number of viral copies(<138 copies/mL). A negative result must be combined with clinical observations, patient history, and epidemiological information. The expected result is Negative.  Fact Sheet for Patients:  EntrepreneurPulse.com.au  Fact Sheet for Healthcare Providers:  IncredibleEmployment.be  This test is no t yet approved or cleared by the [place] FDA and  has been authorized for detection and/or diagnosis of SARS-CoV-2 by FDA under an Emergency Use Authorization (EUA). This EUA will remain  in effect (meaning this test can be used) for the duration of the COVID-19 declaration under Section 564(b)(1) of the Act, 21 U.S.C.section 360bbb-3(b)(1), unless the authorization is terminated  or revoked sooner.       Influenza A by PCR NEGATIVE NEGATIVE Final   Influenza B by PCR NEGATIVE NEGATIVE Final    Comment: (NOTE) The Xpert Xpress SARS-CoV-2/FLU/RSV plus assay is intended as an aid in the diagnosis of influenza from Nasopharyngeal swab specimens and should not be used as a sole basis for treatment. Nasal washings and aspirates are unacceptable for Xpert Xpress SARS-CoV-2/FLU/RSV testing.  Fact Sheet for Patients: EntrepreneurPulse.com.au  Fact Sheet for Healthcare Providers: IncredibleEmployment.be  This test is not yet  approved or cleared by the [place] FDA and has been authorized for detection and/or diagnosis of SARS-CoV-2 by FDA under an Emergency Use Authorization (EUA). This EUA will remain in effect (meaning this test can be used) for the duration of the COVID-19 declaration under Section 564(b)(1) of the Act, 21 U.S.C. section 360bbb-3(b)(1), unless the authorization is terminated or revoked.     Resp Syncytial Virus by PCR NEGATIVE NEGATIVE Final    Comment: (NOTE) Fact Sheet for Patients: EntrepreneurPulse.com.au  Fact Sheet for Healthcare Providers: IncredibleEmployment.be  This test is not yet approved or cleared by the [place] FDA and has been authorized for detection and/or diagnosis of SARS-CoV-2 by FDA under an Emergency Use Authorization (EUA). This EUA will remain in effect (meaning this test can be used) for the duration of the COVID-19 declaration under Section 564(b)(1) of the Act, 21 U.S.C. section 360bbb-3(b)(1), unless the authorization is terminated or revoked.  Performed at [facility], [place] [address] [place], [place] [address]   Culture, Urine (Do not remove urinary catheter, catheter placed by urology or difficult to place)     Status: Abnormal   Collection Time: 07/20/22  2:49 PM   Specimen: Urine, Catheterized  Result Value Ref Range Status   Specimen Description   Final    URINE, CATHETERIZED  Performed at [facility], [place] [address] [place], [place] [address]    Special Requests   Final    NONE Performed at [facility], [place] [address], [place], [place] [address]    Culture (A)  Final    >=100,000 COLONIES/mL METHICILLIN RESISTANT STAPHYLOCOCCUS AUREUS >=100,000 COLONIES/mL DIPHTHEROIDS(CORYNEBACTERIUM SPECIES) Standardized susceptibility testing for this organism is not available. Performed at [facility], [place] [address], [place],   [address]    Report Status 07/22/2022 FINAL  Final   Organism ID, Bacteria METHICILLIN RESISTANT STAPHYLOCOCCUS AUREUS (A)  Final      Susceptibility   Methicillin resistant staphylococcus aureus - MIC*    CIPROFLOXACIN >=8 RESISTANT Resistant     GENTAMICIN <=0.5 SENSITIVE Sensitive     NITROFURANTOIN <=16 SENSITIVE Sensitive     OXACILLIN >=4 RESISTANT Resistant     TETRACYCLINE <=1 SENSITIVE Sensitive     VANCOMYCIN 1 SENSITIVE Sensitive     TRIMETH/SULFA <=10 SENSITIVE Sensitive     CLINDAMYCIN >=8 RESISTANT Resistant     RIFAMPIN <=0.5 SENSITIVE Sensitive     Inducible Clindamycin NEGATIVE Sensitive     * >=100,000 COLONIES/mL METHICILLIN RESISTANT STAPHYLOCOCCUS AUREUS  Resp panel by RT-PCR (RSV, Flu A&B, Covid) Anterior Nasal Swab     Status: None   Collection Time: 07/30/22 10:34 AM   Specimen: Anterior Nasal Swab  Result Value Ref Range Status   SARS Coronavirus 2 by RT PCR NEGATIVE NEGATIVE Final    Comment: (NOTE) SARS-CoV-2 target nucleic acids are NOT DETECTED.  The SARS-CoV-2 RNA is generally detectable in upper respiratory specimens during the acute phase of infection. The lowest concentration of SARS-CoV-2 viral copies this assay can detect is 138 copies/mL. A negative result does not preclude SARS-Cov-2 infection and should not be used as the sole basis for treatment or other patient management decisions. A negative result may occur with  improper specimen collection/handling, submission of specimen other than nasopharyngeal swab, presence of viral mutation(s) within the areas targeted by this assay, and inadequate number of viral copies(<138 copies/mL). A negative result must be combined with clinical observations, patient history, and epidemiological information. The expected result is Negative.  Fact Sheet for Patients:  EntrepreneurPulse.com.au  Fact Sheet for Healthcare Providers:  IncredibleEmployment.be  This test is  no t yet approved or cleared by the [place] FDA and  has been authorized for detection and/or diagnosis of SARS-CoV-2 by FDA under an Emergency Use Authorization (EUA). This EUA will remain  in effect (meaning this test can be used) for the duration of the COVID-19 declaration under Section 564(b)(1) of the Act, 21 U.S.C.section 360bbb-3(b)(1), unless the authorization is terminated  or revoked sooner.       Influenza A by PCR NEGATIVE NEGATIVE Final   Influenza B by PCR NEGATIVE NEGATIVE Final    Comment: (NOTE) The Xpert Xpress SARS-CoV-2/FLU/RSV plus assay is intended as an aid in the diagnosis of influenza from Nasopharyngeal swab specimens and should not be used as a sole basis for treatment. Nasal washings and aspirates are unacceptable for Xpert Xpress SARS-CoV-2/FLU/RSV testing.  Fact Sheet for Patients: EntrepreneurPulse.com.au  Fact Sheet for Healthcare Providers: IncredibleEmployment.be  This test is not yet approved or cleared by the [place] FDA and has been authorized for detection and/or diagnosis of SARS-CoV-2 by FDA under an Emergency Use Authorization (EUA). This EUA will remain in effect (meaning this test can be used) for the duration of the COVID-19 declaration under Section 564(b)(1) of  the Act, 21 U.S.C. section 360bbb-3(b)(1), unless the authorization is terminated or revoked.     Resp Syncytial Virus by PCR NEGATIVE NEGATIVE Final    Comment: (NOTE) Fact Sheet for Patients: EntrepreneurPulse.com.au  Fact Sheet for Healthcare Providers: IncredibleEmployment.be  This test is not yet approved or cleared by the [place] FDA and has been authorized for detection and/or diagnosis of SARS-CoV-2 by FDA under an Emergency Use Authorization (EUA). This EUA will remain in effect (meaning this test can be used) for the duration of the COVID-19 declaration under Section  564(b)(1) of the Act, 21 U.S.C. section 360bbb-3(b)(1), unless the authorization is terminated or revoked.  Performed at [facility], [place] [address] [place], [place] [address]      Radiological Exams on Admission: CT Head Wo Contrast  Result Date: 07/30/2022 CLINICAL DATA:  Mental status change, unknown cause EXAM: CT HEAD WITHOUT CONTRAST TECHNIQUE: Contiguous axial images were obtained from the base of the skull through the vertex without intravenous contrast. RADIATION DOSE REDUCTION: This exam was performed according to the departmental dose-optimization program which includes automated exposure control, adjustment of the mA and/or kV according to patient size and/or use of iterative reconstruction technique. COMPARISON:  MRI head March 25, 2022. FINDINGS: Brain: Encephalomalacia in the inferior right frontal lobe and right PCA territory. No evidence of acute large vascular territory infarct, acute hemorrhage, mass lesion or midline shift. No hydrocephalus. Vascular: No hyperdense vessel identified. Skull: No acute fracture. Sinuses/Orbits: Clear sinuses.  No acute orbital findings. Other: No mastoid effusions. IMPRESSION: 1. No evidence of acute intracranial abnormality. 2. Areas of encephalomalacia. Electronically Signed   By: Margaretha Sheffield M.D.   On: 07/30/2022 12:01   DG Chest Port 1 View  Result Date: 07/30/2022 CLINICAL DATA:  Fever and altered mental status EXAM: PORTABLE CHEST 1 VIEW COMPARISON:  Chest radiograph dated 05/22/2023 FINDINGS: Unchanged elevation of the left hemidiaphragm. Left basilar linear opacities. No pleural effusion or pneumothorax. The heart size and mediastinal contours are within normal limits. Multiple healed bilateral rib fractures. IMPRESSION: No active disease. Electronically Signed   By: Darrin Nipper M.D.   On: 07/30/2022 10:35    EKG: Sinus rhythm Assessment/Plan Active Problems:   AKI (acute kidney injury) ([place])   #1 acute  metabolic encephalopathy likely secondary to multifactorial causes.  Patient admitted with fever not eating or drinking.  On admission found to be in AKI and possible urinary tract infection. Sepsis bundle was initiated in the ER.  His blood pressure on admission was 100/60 pulse was 90 respiration 12 saturation 95% on 2 L Normal lactic acid Leukocytosis 15.5 Continue IV fluids Patient received vancomycin and aztreonam and Flagyl in the ER.  He is allergic to Rocephin and doxycycline.  #2 fever likely source urine.  Patient has BPH chronic Foley catheter and prostate cancer.  Unsure how high was his fever in the nursing home.  He was discharged from the hospital on 07/24/2022 on Levaquin for 7 days for pneumonia the last date would have been 07/30/2022. Repeat urine culture and blood cultures were done in the ER follow-up. Since he received antibiotics in the ER and has been on Levaquin till today I will hold off on adding any other antibiotics today monitor him closely overnight follow-up cultures and reassess in the morning. Last urine culture was positive possibly contaminant.  Blood cultures remained negative.  #3 AKI likely from prerenal with decreased p.o. intake.  Continue IV fluids.  Check bladder scan.  Patient  has a Foley catheter in place with cloudy urine.  #4 dementia on Zyprexa at the nursing home which I will hold tonight due to change in mental status  #5 hypertension blood pressures soft to normal will hold home BP meds  #6 chronic anemia patient received 2 units of blood transfusion earlier this month.  Patient was seen by GI during that admission for FOBT positive no colonoscopy was planned.  Will monitor H&H.  #7 CODE STATUS discussed with patient's daughter-full code.   RN Pressure Injury Documentation: Pressure Injury 07/20/22 Ankle Anterior;Left Unstageable - Full thickness tissue loss in which the base of the injury is covered by slough (yellow, tan, gray, green or  brown) and/or eschar (tan, brown or black) in the wound bed. (Active)  07/20/22 1809  Location: Ankle  Location Orientation: Anterior;Left  Staging: Unstageable - Full thickness tissue loss in which the base of the injury is covered by slough (yellow, tan, gray, green or brown) and/or eschar (tan, brown or black) in the wound bed.  Wound Description (Comments):   Present on Admission: Yes  Dressing Type Gauze (Comment) 07/24/22 1500     Pressure Injury 07/20/22 Foot Anterior;Left Stage 3 -  Full thickness tissue loss. Subcutaneous fat may be visible but bone, tendon or muscle are NOT exposed. (Active)  07/20/22 1810  Location: Foot  Location Orientation: Anterior;Left  Staging: Stage 3 -  Full thickness tissue loss. Subcutaneous fat may be visible but bone, tendon or muscle are NOT exposed.  Wound Description (Comments):   Present on Admission: Yes  Dressing Type Gauze (Comment) 07/24/22 1500       Estimated body mass index is 18.56 kg/m as calculated from the following:   Height as of 07/20/22: 5' 11"$  (1.803 m).   Weight as of 07/20/22: 60.4 kg.   DVT prophylaxis:  heparin Code Status:  full Family Communication:  dw daughter Disposition Plan:  Pending clinical improvement  Consults called:  none Admission status:  IP   Georgette Shell MD  07/30/2022, 12:40 PM

## 2022-07-30 NOTE — ED Notes (Signed)
ED TO INPATIENT HANDOFF REPORT  ED Nurse Name and Phone #: Ronalee Belts [phone]  S Name/Age/Gender Jason Jones 72 y.o. male Room/Bed: WA16/WA16  Code Status   Code Status: Prior  Home/SNF/Other Skilled nursing facility Patient oriented to: situation Is this baseline? Yes   Triage Complete: Triage complete  Chief Complaint AKI (acute kidney injury) [facility]) [N17.9]  Triage Note MS reports from [facility], called out for fever, AMS. Staff states this has been going on for several days, also states Pt has been declining for months. PCP treated in house with fluids. Left side deficit from prior stroke.  BP 100/60 HR 90 RR 12 Sp02 90 @ 2ltrs. CBG 127   20 R hand    Allergies Allergies  Allergen Reactions   Ceftriaxone     Possible lips swelling (dose 07/21/22)    Doxycycline Other (See Comments)    "ALLERGIC," per facility document    Level of Care/Admitting Diagnosis ED Disposition     ED Disposition  [place]: [place] [id number]  Level of Care: Med-Surg [16]  May admit patient to [facility] or [facility] if equivalent level of care is available:: Yes  Covid Evaluation: Asymptomatic - no recent exposure (last 10 days) testing not required  Diagnosis: AKI (acute kidney injury) [facility]) [id number]  Admitting Physician: Georgette Shell [id number]  Attending Physician: Georgette Shell [id number]  Certification:: I certify this patient will need inpatient services for at least 2 midnights  Estimated Length of Stay: 4          B Medical/Surgery History Past Medical History:  Diagnosis Date   Arthritis    Asthma    Cancer ([place])    Chronic alcohol abuse    Dyspnea    GERD (gastroesophageal reflux disease)    PRN  ---  TAKES BAKING SODA IN WATER   History of pneumothorax    04-28-2006  fell, left fx rib--  resolved w/ chest tube   Hypertension    Nocturia    Poor dental hygiene     Right inguinal hernia    Weak urinary stream    Past Surgical History:  Procedure Laterality Date   COLONOSCOPY  01/08/2021   GOLD SEED IMPLANT N/A 05/23/2021   Procedure: GOLD SEED IMPLANT;  Surgeon: Ceasar Mons, MD;  Location: [facility];  Service: Urology;  Laterality: N/A;   INGUINAL HERNIA REPAIR Right 04/23/2017   Procedure: OPEN RIGHT INGUINAL HERNIA REPAIR WITH MESH;  Surgeon: Kinsinger, Arta Bruce, MD;  Location: [place];  Service: General;  Laterality: Right;  GENERAL COMBINED WITH REGIONAL FOR POST OP PAIN    INSERTION OF MESH Right 04/23/2017   Procedure: INSERTION OF MESH;  Surgeon: Kinsinger, Arta Bruce, MD;  Location: [place];  Service: General;  Laterality: Right;  GENERAL COMBINED WITH REGIONAL FOR POST OP PAIN    LIPOMA EXCISION Right 02/22/2015   Procedure: RIGHT SHOULDER MASS EXCISION 15 CM SQ;  Surgeon: Mickeal Skinner, MD;  Location: [facility];  Service: General;  Laterality: Right;   SPACE OAR INSTILLATION N/A 05/23/2021   Procedure: SPACE OAR INSTILLATION;  Surgeon: Ceasar Mons, MD;  Location: [facility];  Service: Urology;  Laterality: N/A;   TRANSRECTAL ULTRASOUND N/A 05/23/2021   Procedure: TRANSRECTAL ULTRASOUND;  Surgeon: Ceasar Mons, MD;  Location: [facility];  Service: Urology;  Laterality: N/A;     A  IV Location/Drains/Wounds Patient Lines/Drains/Airways Status     Active Line/Drains/Airways     Name Placement date Placement time Site Days   Peripheral IV 07/30/22 20 G Anterior;Right Hand 07/30/22  1000  Hand  less than 1   Peripheral IV 07/30/22 20 G Anterior;Right Forearm 07/30/22  1006  Forearm  less than 1   Urethral Catheter [name], Rn Double-lumen 16 Fr. 07/21/22  1030  Double-lumen  9   Pressure Injury 07/20/22 Ankle Anterior;Left Unstageable - Full thickness tissue loss in which the base of the injury is covered by slough (yellow, tan, gray,  green or brown) and/or eschar (tan, brown or black) in the wound bed. 07/20/22  1809  -- 10   Pressure Injury 07/20/22 Foot Anterior;Left Stage 3 -  Full thickness tissue loss. Subcutaneous fat may be visible but bone, tendon or muscle are NOT exposed. 07/20/22  1810  -- 10            Intake/Output Last 24 hours No intake or output data in the 24 hours ending 07/30/22 1302  Labs/Imaging Results for orders placed or performed during the hospital encounter of 07/30/22 (from the past 48 hour(s))  CBC with Differential     Status: Abnormal   Collection Time: 07/30/22 10:07 AM  Result Value Ref Range   WBC 15.5 (H) 4.0 - 10.5 K/uL   RBC 3.70 (L) 4.22 - 5.81 MIL/uL   Hemoglobin 9.7 (L) 13.0 - 17.0 g/dL   HCT 32.1 (L) 39.0 - 52.0 %   MCV 86.8 80.0 - 100.0 fL   MCH 26.2 26.0 - 34.0 pg   MCHC 30.2 30.0 - 36.0 g/dL   RDW 16.8 (H) 11.5 - 15.5 %   Platelets 463 (H) 150 - 400 K/uL   nRBC 0.0 0.0 - 0.2 %   Neutrophils Relative % 86 %   Neutro Abs 13.3 (H) 1.7 - 7.7 K/uL   Lymphocytes Relative 7 %   Lymphs Abs 1.1 0.7 - 4.0 K/uL   Monocytes Relative 5 %   Monocytes Absolute 0.8 0.1 - 1.0 K/uL   Eosinophils Relative 1 %   Eosinophils Absolute 0.2 0.0 - 0.5 K/uL   Basophils Relative 0 %   Basophils Absolute 0.0 0.0 - 0.1 K/uL   Immature Granulocytes 1 %   Abs Immature Granulocytes 0.09 (H) 0.00 - 0.07 K/uL    Comment: Performed at [facility], [place] [address], [place], [place] [address]  Comprehensive metabolic panel     Status: Abnormal   Collection Time: 07/30/22 10:07 AM  Result Value Ref Range   Sodium 140 135 - 145 mmol/L   Potassium 3.8 3.5 - 5.1 mmol/L   Chloride 104 98 - 111 mmol/L   CO2 24 22 - 32 mmol/L   Glucose, Bld 123 (H) 70 - 99 mg/dL    Comment: Glucose reference range applies only to samples taken after fasting for at least 8 hours.   BUN 59 (H) 8 - 23 mg/dL   Creatinine, Ser 4.70 (H) 0.61 - 1.24 mg/dL   Calcium 8.4 (L) 8.9 - 10.3 mg/dL   Total  Protein 8.3 (H) 6.5 - 8.1 g/dL   Albumin 2.4 (L) 3.5 - 5.0 g/dL   AST 19 15 - 41 U/L   ALT 21 0 - 44 U/L   Alkaline Phosphatase 49 38 - 126 U/L   Total Bilirubin 0.4 0.3 - 1.2 mg/dL   GFR, Estimated 12 (L) >60 mL/min    Comment: (NOTE) Calculated  using the CKD-EPI Creatinine Equation (2021)    Anion gap 12 5 - 15    Comment: Performed at [facility], [place] [address], [place], [place] [address]  Protime-INR     Status: Abnormal   Collection Time: 07/30/22 10:07 AM  Result Value Ref Range   Prothrombin Time 15.4 (H) 11.4 - 15.2 seconds   INR 1.2 0.8 - 1.2    Comment: (NOTE) INR goal varies based on device and disease states. Performed at [facility], [place] [address] [place], [place] [address]   Troponin I (High Sensitivity)     Status: None   Collection Time: 07/30/22 10:07 AM  Result Value Ref Range   Troponin I (High Sensitivity) 7 <18 ng/L    Comment: (NOTE) Elevated high sensitivity troponin I (hsTnI) values and significant  changes across serial measurements may suggest ACS but many other  chronic and acute conditions are known to elevate hsTnI results.  Refer to the "Links" section for chest pain algorithms and additional  guidance. Performed at [facility], [place] [address] [place], [place] [address]   Lactic acid, plasma     Status: None   Collection Time: 07/30/22 10:08 AM  Result Value Ref Range   Lactic Acid, Venous 1.1 0.5 - 1.9 mmol/L    Comment: Performed at [facility], [place] [address], [place], [place] [address]  Ammonia     Status: None   Collection Time: 07/30/22 10:11 AM  Result Value Ref Range   Ammonia 20 9 - 35 umol/L    Comment: Performed at [facility], [place] [address], [place], [place] [address]  Resp panel by RT-PCR (RSV, Flu A&B, Covid) Anterior Nasal Swab     Status: None   Collection Time: 07/30/22 10:34 AM   Specimen: Anterior Nasal Swab  Result  Value Ref Range   SARS Coronavirus 2 by RT PCR NEGATIVE NEGATIVE    Comment: (NOTE) SARS-CoV-2 target nucleic acids are NOT DETECTED.  The SARS-CoV-2 RNA is generally detectable in upper respiratory specimens during the acute phase of infection. The lowest concentration of SARS-CoV-2 viral copies this assay can detect is 138 copies/mL. A negative result does not preclude SARS-Cov-2 infection and should not be used as the sole basis for treatment or other patient management decisions. A negative result may occur with  improper specimen collection/handling, submission of specimen other than nasopharyngeal swab, presence of viral mutation(s) within the areas targeted by this assay, and inadequate number of viral copies(<138 copies/mL). A negative result must be combined with clinical observations, patient history, and epidemiological information. The expected result is Negative.  Fact Sheet for Patients:  EntrepreneurPulse.com.au  Fact Sheet for Healthcare Providers:  IncredibleEmployment.be  This test is no t yet approved or cleared by the [place] FDA and  has been authorized for detection and/or diagnosis of SARS-CoV-2 by FDA under an Emergency Use Authorization (EUA). This EUA will remain  in effect (meaning this test can be used) for the duration of the COVID-19 declaration under Section 564(b)(1) of the Act, 21 U.S.C.section 360bbb-3(b)(1), unless the authorization is terminated  or revoked sooner.       Influenza A by PCR NEGATIVE NEGATIVE   Influenza B by PCR NEGATIVE NEGATIVE    Comment: (NOTE) The Xpert Xpress SARS-CoV-2/FLU/RSV plus assay is intended as an aid in the diagnosis of influenza from Nasopharyngeal swab specimens and should not be used as a sole basis for treatment. Nasal washings  and aspirates are unacceptable for Xpert Xpress SARS-CoV-2/FLU/RSV testing.  Fact Sheet for  Patients: EntrepreneurPulse.com.au  Fact Sheet for Healthcare Providers: IncredibleEmployment.be  This test is not yet approved or cleared by the [place] FDA and has been authorized for detection and/or diagnosis of SARS-CoV-2 by FDA under an Emergency Use Authorization (EUA). This EUA will remain in effect (meaning this test can be used) for the duration of the COVID-19 declaration under Section 564(b)(1) of the Act, 21 U.S.C. section 360bbb-3(b)(1), unless the authorization is terminated or revoked.     Resp Syncytial Virus by PCR NEGATIVE NEGATIVE    Comment: (NOTE) Fact Sheet for Patients: EntrepreneurPulse.com.au  Fact Sheet for Healthcare Providers: IncredibleEmployment.be  This test is not yet approved or cleared by the [place] FDA and has been authorized for detection and/or diagnosis of SARS-CoV-2 by FDA under an Emergency Use Authorization (EUA). This EUA will remain in effect (meaning this test can be used) for the duration of the COVID-19 declaration under Section 564(b)(1) of the Act, 21 U.S.C. section 360bbb-3(b)(1), unless the authorization is terminated or revoked.  Performed at [facility], [place] [address], [place], [place] [address]    CT Head Wo Contrast  Result Date: 07/30/2022 CLINICAL DATA:  Mental status change, unknown cause EXAM: CT HEAD WITHOUT CONTRAST TECHNIQUE: Contiguous axial images were obtained from the base of the skull through the vertex without intravenous contrast. RADIATION DOSE REDUCTION: This exam was performed according to the departmental dose-optimization program which includes automated exposure control, adjustment of the mA and/or kV according to patient size and/or use of iterative reconstruction technique. COMPARISON:  MRI head March 25, 2022. FINDINGS: Brain: Encephalomalacia in the inferior right frontal lobe and right PCA  territory. No evidence of acute large vascular territory infarct, acute hemorrhage, mass lesion or midline shift. No hydrocephalus. Vascular: No hyperdense vessel identified. Skull: No acute fracture. Sinuses/Orbits: Clear sinuses.  No acute orbital findings. Other: No mastoid effusions. IMPRESSION: 1. No evidence of acute intracranial abnormality. 2. Areas of encephalomalacia. Electronically Signed   By: Margaretha Sheffield M.D.   On: 07/30/2022 12:01   DG Chest Port 1 View  Result Date: 07/30/2022 CLINICAL DATA:  Fever and altered mental status EXAM: PORTABLE CHEST 1 VIEW COMPARISON:  Chest radiograph dated 05/22/2023 FINDINGS: Unchanged elevation of the left hemidiaphragm. Left basilar linear opacities. No pleural effusion or pneumothorax. The heart size and mediastinal contours are within normal limits. Multiple healed bilateral rib fractures. IMPRESSION: No active disease. Electronically Signed   By: Darrin Nipper M.D.   On: 07/30/2022 10:35    Pending Labs Unresulted Labs (From admission, onward)     Start     Ordered   07/30/22 1015  Urinalysis, Routine w reflex microscopic -Urine, Catheterized; Indwelling urinary catheter  Once,   URGENT       Question Answer Comment  Specimen Source Urine, Catheterized   Specimen Source Indwelling urinary catheter      07/30/22 1015   07/30/22 1008  Lactic acid, plasma  Now then every 2 hours,   R (with STAT occurrences)      07/30/22 1008   07/30/22 1008  Blood culture (routine x 2)  BLOOD CULTURE X 2,   R (with STAT occurrences)      07/30/22 1008            Vitals/Pain Today's Vitals   07/30/22 0959 07/30/22 1002 07/30/22 1019 07/30/22 1230  BP:  133/75  128/74  Pulse:  84  79  Resp:  20  11  Temp:  98.9 F (37.2 C) 99.8 F (37.7 C)   TempSrc:  Oral Rectal   SpO2:  92%  95%  PainSc: 0-No pain       Isolation Precautions Airborne and Contact precautions  Medications Medications  lactated ringers infusion (has no administration in  time range)  metroNIDAZOLE (FLAGYL) IVPB 500 mg (500 mg Intravenous New Bag/Given 07/30/22 1220)  vancomycin (VANCOCIN) IVPB 1000 mg/200 mL premix (1,000 mg Intravenous New Bag/Given 07/30/22 1221)  lactated ringers bolus 1,000 mL (1,000 mLs Intravenous New Bag/Given 07/30/22 1219)    And  lactated ringers bolus 1,000 mL (1,000 mLs Intravenous New Bag/Given 07/30/22 1219)    Mobility non-ambulatory     Focused Assessments AMS   R Recommendations: See Admitting Provider Note  Report given to:   Additional Notes: .

## 2022-07-30 NOTE — ED Provider Notes (Signed)
[place] AT [facility] Provider Note   CSN: [id number] Arrival date & time: 07/30/22  [id number]     History  Chief Complaint  Patient presents with   Fever   Altered Mental Status    Jason Jones is a 72 y.o. male.  72 year old male with history of dementia and CVA presents with altered mental status.  According to EMS, patient has had gradual decline over the last several weeks.  Patient has had fevers at his facility.  He is not currently taking any antibiotics.  The hide of patient's fever is unknown.  His baseline left side deficits from prior CVA.  EMS called and patient CBG was 127 and patient transported here.       Home Medications Prior to Admission medications   Medication Sig Start Date End Date Taking? Authorizing Provider  albuterol (PROVENTIL) (2.5 MG/3ML) 0.083% nebulizer solution Take 3 mLs (2.5 mg total) by nebulization every 4 (four) hours as needed for wheezing or shortness of breath. 03/28/22   Nani Gasser, MD  aspirin 81 MG chewable tablet Chew 1 tablet (81 mg total) by mouth daily. Patient taking differently: Chew 81 mg by mouth in the morning. 03/29/22   Nani Gasser, MD  atenolol (TENORMIN) 25 MG tablet Take 12.5-25 mg by mouth See admin instructions. Take 12.5 mg by mouth once a day and an additional 25 mg "as needed for a HR >99 BPM"    [provider]  B Complex-Biotin-FA TABS Take 1 tablet by mouth in the morning.    [provider]  baclofen (LIORESAL) 10 MG tablet Take 10 mg by mouth 2 (two) times daily.    [provider]  CALCIUM ALGINATE EX Apply 1 application  topically See admin instructions. Calcium Alginate-Silver external pad 4"x 5". Apply to left ankle and foot topically every day shift every other day for wound healing.    [provider]  doxazosin (CARDURA) 1 MG tablet Take 1 mg by mouth in the morning.    [provider]  Emollient (AQUAPHOR OINTMENT  BODY EX) Apply 1 application  topically See admin instructions. Apply to the lower legs and feet every shift    [provider]  feeding supplement (ENSURE ENLIVE / ENSURE PLUS) LIQD Take 237 mLs by mouth 2 (two) times daily between meals. Patient not taking: Reported on 07/20/2022 03/28/22   Nani Gasser, MD  ferrous gluconate (FERGON) 324 MG tablet Take 324 mg by mouth daily with breakfast. 07/02/22 08/31/22  [provider]  gabapentin (NEURONTIN) 100 MG capsule Take 200-300 mg by mouth See admin instructions. Take 200 mg by mouth once a day and 300 mg in the evening    [provider]  leptospermum manuka honey (MEDIHONEY) PSTE paste Apply 1 Application topically daily. 07/24/22   Debbe Odea, MD  mirtazapine (REMERON) 7.5 MG tablet Take 7.5 mg by mouth at bedtime.    [provider]  NON FORMULARY Take 120 mLs by mouth See admin instructions. MedPass 2.0 - Drink 120 ml's by mouth three times a day    [provider]  OLANZapine zydis (ZYPREXA) 5 MG disintegrating tablet Take 1 tablet (5 mg total) by mouth 2 (two) times daily as needed. 07/24/22   Debbe Odea, MD  polyethylene glycol (MIRALAX / GLYCOLAX) 17 g packet Take 17 g by mouth daily as needed. Patient taking differently: Take 17 g by mouth daily as needed (for constipation). 04/16/22   Hosie Poisson, MD  rosuvastatin (CRESTOR) 20 MG tablet Take 1 tablet (20 mg total) by mouth daily. Patient taking differently: Take 10-20 mg by mouth See admin instructions. Take 20 mg by mouth at bedtime every other night, alternating with the 10 mg strength 03/29/22   Nani Gasser, MD  senna-docusate (SENOKOT-S) 8.6-50 MG tablet Take 1 tablet by mouth at bedtime.    [provider]  sodium chloride (OCEAN) 0.65 % SOLN nasal spray Place 1 spray into both nostrils as needed for congestion. 04/16/22   Hosie Poisson, MD  triamcinolone cream (KENALOG) 0.1 % Apply 1 Application topically See admin  instructions. Apply to rashes every day and evening shift- right arm, right thigh, left thigh, and any other affected areas    [provider]      Allergies    Ceftriaxone and Doxycycline    Review of Systems   Review of Systems  Unable to perform ROS: Dementia    Physical Exam Updated Vital Signs BP 133/75   Pulse 84   Temp 99.8 F (37.7 C) (Rectal)   Resp 20   SpO2 92%  Physical Exam Vitals and nursing note reviewed.  Constitutional:      General: He is not in acute distress.    Appearance: He is underweight. He is not toxic-appearing.  HENT:     Head: Normocephalic and atraumatic.  Eyes:     General: Lids are normal.     Conjunctiva/sclera: Conjunctivae normal.     Pupils: Pupils are equal, round, and reactive to light.  Neck:     Thyroid: No thyroid mass.     Trachea: No tracheal deviation.  Cardiovascular:     Rate and Rhythm: Normal rate and regular rhythm.     Heart sounds: Normal heart sounds. No murmur heard.    No gallop.  Pulmonary:     Effort: Pulmonary effort is normal. No respiratory distress.     Breath sounds: Normal breath sounds. No stridor. No decreased breath sounds, wheezing, rhonchi or rales.  Abdominal:     General: There is no distension.     Palpations: Abdomen is soft.     Tenderness: There is no abdominal tenderness. There is no rebound.  Musculoskeletal:        General: No tenderness. Normal range of motion.     Cervical back: Normal range of motion and neck supple.  Skin:    General: Skin is warm and dry.     Findings: No abrasion or rash.  Neurological:     Mental Status: He is oriented to person, place, and time. Mental status is at baseline. He is lethargic.     GCS: GCS eye subscore is 4. GCS verbal subscore is 3. GCS motor subscore is 5.     Cranial Nerves: No cranial nerve deficit.     Sensory: No sensory deficit.     Motor: No tremor.  Psychiatric:        Attention and Perception: He is inattentive.     ED  Results / Procedures / Treatments   Labs (all labs ordered are listed, but only abnormal results are displayed) Labs Reviewed  CULTURE, BLOOD (ROUTINE X 2)  CULTURE, BLOOD (ROUTINE X 2)  RESP PANEL BY RT-PCR (RSV, FLU A&B, COVID)  [id number]  CBC WITH DIFFERENTIAL/PLATELET  COMPREHENSIVE METABOLIC PANEL  PROTIME-INR  LACTIC ACID, PLASMA  LACTIC ACID, PLASMA  AMMONIA  URINALYSIS, ROUTINE W REFLEX MICROSCOPIC  TROPONIN I (HIGH SENSITIVITY)    EKG EKG Interpretation  Date/Time:  Tuesday July 30 2022 10:38:46 EST Ventricular Rate:  82 PR Interval:  204 QRS Duration: 80 QT Interval:  439 QTC Calculation: 513 R Axis:   73 Text Interpretation: Sinus rhythm Prolonged QT interval Artifact in lead(s) II III aVR aVL aVF V1 V2 V3 V4 V5 V6 Confirmed by Lacretia Leigh ([id number]) on 07/30/2022 12:29:18 PM  Radiology No results found.  Procedures Procedures    Medications Ordered in ED Medications - No data to display  ED Course/ Medical Decision Making/ A&P                             Medical Decision Making Amount and/or Complexity of Data Reviewed Labs: ordered. Radiology: ordered. ECG/medicine tests: ordered.  Risk Prescription drug management.   Patient is EKG per my interpretation shows normal sinus rhythm.  Concern for possible sepsis process.  Blood cultures and lactate ordered.  COVID and flu test negative here.  Ammonia level negative.  Patient's electrolytes significant for acute kidney injury.  Given IV fluids here.  Sepsis protocol started.  Given IV antibiotics.  No clear source at this time.  Head CT ordered due to altered mental status and per my interpretation showed no acute findings.  Plan will be for patient to be admitted to the hospitalist service.  CRITICAL CARE Performed by: Leota Jacobsen Total critical care time: 60 minutes Critical care time was exclusive of separately billable procedures and treating other patients. Critical care was necessary to  treat or prevent imminent or life-threatening deterioration. Critical care was time spent personally by me on the following activities: development of treatment plan with patient and/or surrogate as well as nursing, discussions with consultants, evaluation of patient's response to treatment, examination of patient, obtaining history from patient or surrogate, ordering and performing treatments and interventions, ordering and review of laboratory studies, ordering and review of radiographic studies, pulse oximetry and re-evaluation of patient's condition.         Final Clinical Impression(s) / ED Diagnoses Final diagnoses:  None    Rx / DC Orders ED Discharge Orders     None         Lacretia Leigh, MD 07/30/22 1230

## 2022-07-30 NOTE — ED Triage Notes (Signed)
MS reports from [place], called out for fever, AMS. Staff states this has been going on for several days, also states Pt has been declining for months. PCP treated in house with fluids. Left side deficit from prior stroke.  BP 100/60 HR 90 RR 12 Sp02 90 @ 2ltrs. CBG 127   20 R hand

## 2022-07-31 ENCOUNTER — Inpatient Hospital Stay (HOSPITAL_COMMUNITY): Payer: 59

## 2022-07-31 DIAGNOSIS — N1330 Unspecified hydronephrosis: Secondary | ICD-10-CM | POA: Diagnosis not present

## 2022-07-31 DIAGNOSIS — C61 Malignant neoplasm of prostate: Secondary | ICD-10-CM

## 2022-07-31 DIAGNOSIS — N179 Acute kidney failure, unspecified: Secondary | ICD-10-CM | POA: Diagnosis not present

## 2022-07-31 DIAGNOSIS — F01C18 Vascular dementia, severe, with other behavioral disturbance: Secondary | ICD-10-CM

## 2022-07-31 DIAGNOSIS — R339 Retention of urine, unspecified: Secondary | ICD-10-CM | POA: Diagnosis not present

## 2022-07-31 DIAGNOSIS — N138 Other obstructive and reflux uropathy: Secondary | ICD-10-CM | POA: Diagnosis not present

## 2022-07-31 DIAGNOSIS — N401 Enlarged prostate with lower urinary tract symptoms: Secondary | ICD-10-CM

## 2022-07-31 DIAGNOSIS — T83511A Infection and inflammatory reaction due to indwelling urethral catheter, initial encounter: Principal | ICD-10-CM

## 2022-07-31 DIAGNOSIS — Z7189 Other specified counseling: Secondary | ICD-10-CM

## 2022-07-31 DIAGNOSIS — N390 Urinary tract infection, site not specified: Secondary | ICD-10-CM

## 2022-07-31 LAB — COMPREHENSIVE METABOLIC PANEL
ALT: 20 U/L (ref 0–44)
AST: 24 U/L (ref 15–41)
Albumin: 2.3 g/dL — ABNORMAL LOW (ref 3.5–5.0)
Alkaline Phosphatase: 43 U/L (ref 38–126)
Anion gap: 13 (ref 5–15)
BUN: 56 mg/dL — ABNORMAL HIGH (ref 8–23)
CO2: 21 mmol/L — ABNORMAL LOW (ref 22–32)
Calcium: 8.4 mg/dL — ABNORMAL LOW (ref 8.9–10.3)
Chloride: 107 mmol/L (ref 98–111)
Creatinine, Ser: 4.92 mg/dL — ABNORMAL HIGH (ref 0.61–1.24)
GFR, Estimated: 12 mL/min — ABNORMAL LOW (ref >60)
Glucose, Bld: 100 mg/dL — ABNORMAL HIGH (ref 70–99)
Potassium: 4.1 mmol/L (ref 3.5–5.1)
Sodium: 141 mmol/L (ref 135–145)
Total Bilirubin: 0.6 mg/dL (ref 0.3–1.2)
Total Protein: 7.2 g/dL (ref 6.5–8.1)

## 2022-07-31 LAB — BASIC METABOLIC PANEL
Anion gap: 14 (ref 5–15)
BUN: 58 mg/dL — ABNORMAL HIGH (ref 8–23)
CO2: 19 mmol/L — ABNORMAL LOW (ref 22–32)
Calcium: 8.1 mg/dL — ABNORMAL LOW (ref 8.9–10.3)
Chloride: 110 mmol/L (ref 98–111)
Creatinine, Ser: 4.79 mg/dL — ABNORMAL HIGH (ref 0.61–1.24)
GFR, Estimated: 12 mL/min — ABNORMAL LOW (ref >60)
Glucose, Bld: 92 mg/dL (ref 70–99)
Potassium: 4.1 mmol/L (ref 3.5–5.1)
Sodium: 143 mmol/L (ref 135–145)

## 2022-07-31 LAB — CBC
HCT: 30 % — ABNORMAL LOW (ref 39.0–52.0)
Hemoglobin: 9.4 g/dL — ABNORMAL LOW (ref 13.0–17.0)
MCH: 26.9 pg (ref 26.0–34.0)
MCHC: 31.3 g/dL (ref 30.0–36.0)
MCV: 85.7 fL (ref 80.0–100.0)
Platelets: 416 10*3/uL — ABNORMAL HIGH (ref 150–400)
RBC: 3.5 MIL/uL — ABNORMAL LOW (ref 4.22–5.81)
RDW: 16.7 % — ABNORMAL HIGH (ref 11.5–15.5)
WBC: 17.8 10*3/uL — ABNORMAL HIGH (ref 4.0–10.5)
nRBC: 0 % (ref 0.0–0.2)

## 2022-07-31 LAB — SODIUM, URINE, RANDOM: Sodium, Ur: 38 mmol/L

## 2022-07-31 LAB — PROCALCITONIN: Procalcitonin: 0.44 ng/mL

## 2022-07-31 LAB — MRSA NEXT GEN BY PCR, NASAL: MRSA by PCR Next Gen: DETECTED — AB

## 2022-07-31 MED ORDER — LACTATED RINGERS IV SOLN: INTRAVENOUS | Status: AC

## 2022-07-31 MED ORDER — HYDROMORPHONE HCL 1 MG/ML IJ SOLN
0.5000 mg | Freq: Once | INTRAMUSCULAR | Status: AC
  Administered 2022-07-31: 0.5 mg via INTRAVENOUS
  Filled 2022-07-31: qty 0.5

## 2022-07-31 MED ORDER — CHLORHEXIDINE GLUCONATE CLOTH 2 % EX PADS
6.0000 | MEDICATED_PAD | Freq: Every day | CUTANEOUS | Status: DC
  Administered 2022-07-31 – 2022-08-05 (×6): 6 via TOPICAL

## 2022-07-31 MED ORDER — LINEZOLID 600 MG PO TABS
600.0000 mg | ORAL_TABLET | Freq: Two times a day (BID) | ORAL | Status: DC
  Administered 2022-07-31 – 2022-08-05 (×11): 600 mg via ORAL
  Filled 2022-07-31 (×11): qty 1

## 2022-07-31 NOTE — Consult Note (Signed)
Consultation Note Date: 07/31/2022   Patient Name: Jason Jones  DOB: 10/01/1950  MRN: [id number]  Age / Sex: 72 y.o., male  PCP: Jason Holms, NP Referring Physician: Georgette Shell, MD  Reason for Consultation:  "goals of care"  HPI/Patient Profile: 72 y.o. 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 AKI with Cr 4.7 on admission, 4.92 today. He had a recent hospitalization 2/10-2/14 for pneumonia and GI bleeding. Palliative medicine consulted for goals of care.    Primary Decision Maker NEXT OF KIN - daughterKenney Jones  Discussion: Chart reviewed including labs, progress notes, imaging from this and previous encounters.  Evaluated patient. He was awake, making vocalizations but no intelligible speech. L arm contracted and swelling.  Urology just at bedside and inserted foley with dense pyuria return.  [place] is unable to participate in goals of care discussion.  Called daughter Jason Jones.  Prior to admission patient able to sit up in bed, eat and speak, although he does require assistance with feeding. This is consistent with SLP note on 2/14 during his previous admission.  His code status is DNR.  Discussed continued medical interventions vs focus on comfort care. Discussed transition to comfort measures only which includes stopping IV fluids, antibiotics, labs and providing symptom management for SOB, anxiety, nausea, vomiting, and other symptoms of dying.  Jason Jones wishes to continue antibiotics and fluids in hopes that kidney function and mental status will improve. However, if he does not show improvement then is open to comfort measures.    SUMMARY OF RECOMMENDATIONS -[place]- continue current  measures hopeful for improvement in kidney function and mental status- if no improvement over next 48 hrs then would consider comfort measures, hopeful for discharge back to facility   -Patient is on zyprexa 42m BID prn- recommend restarting this  -Will start acetaminophen 100412mTID po for likely pain related to immobility, contractures, bladder infection -hydromorphone .12m9mV q6hrs prn also for pain -PMT will followup on Saturday- please call if f/u is needed before then  Code Status/Advance Care Planning: DNR   Prognosis:   Unable to determine  Discharge Planning: To Be Determined  Primary Diagnoses: Present on Admission:  Acute kidney injury (HCCNorth WebsterNormocytic anemia  Malignant neoplasm of prostate (HCCNenanaMalnutrition of moderate degree  Rhabdomyolysis  Elevated liver enzymes  BPH (benign prostatic hyperplasia)   Review of Systems  Unable to perform ROS: Mental status change    Physical Exam Vitals and nursing note reviewed.  Pulmonary:     Effort: Pulmonary effort is normal.  Musculoskeletal:     Comments: R upper extremity contracted and swelling  Neurological:     Mental Status: He is alert.     Comments: Unintelligible speech     Vital Signs: BP 124/84 (BP Location: Right Leg)   Pulse 86   Temp 98 F (36.7 C)  Resp 12   SpO2 95%  Pain Scale: PAINAD   Pain Score: 0-No pain   SpO2: SpO2: 95 % O2 Device:SpO2: 95 % O2 Flow Rate: .O2 Flow Rate (L/min): 2 L/min  IO: Intake/output summary:  Intake/Output Summary (Last 24 hours) at 07/31/2022 1447 Last data filed at 07/31/2022 [id number] Gross per 24 hour  Intake --  Output 575 ml  Net -575 ml    LBM:   Baseline Weight:   Most recent weight:         Thank you for this consult. Palliative medicine will continue to follow and assist as needed.   Greater than 50%  of this time was spent counseling and coordinating care related to the above assessment and plan.  Signed by: Jason Jones,  AGNP-C Palliative Medicine    Please contact Palliative Medicine Team phone at [phone] for questions and concerns.  For individual provider: See Shea Evans

## 2022-07-31 NOTE — Progress Notes (Addendum)
Patient foley flushed with 20 mLs at 0745 per provider request. Emptied 575 mL out of foley bag.  At 0900 bladder scan preformed. >999 found in bladder.   Foley replaced with a 16 french. Urine returned but not flowing into bag.   Dewayne Hatch, RN

## 2022-07-31 NOTE — TOC Initial Note (Signed)
Transition of Care [facility]) - Initial/Assessment Note    Patient Details  Name: Jason Jones MRN: [id number] Date of Birth: 1950-08-11  Transition of Care [facility]) CM/SW Contact:    Vassie Moselle, LCSW Phone Number: 07/31/2022, 12:14 PM  Clinical Narrative:                 Pt coming from [place] where he is a LTC resident. Spoke with pt's daughter via t/c and confirmed current plan for pt to return to LTC SNF at discharge.  TOC will continue to follow for discharge needs.   Expected Discharge Plan: Long Term Nursing Home Barriers to Discharge: Continued Medical Work up   Patient Goals and CMS Choice Patient states their goals for this hospitalization and ongoing recovery are:: For pt to return to [facility].gov Compare Post Acute Care list provided to:: Patient Represenative (must comment) Choice offered to / list presented to : Adult [place] ownership interest in [facility].provided to:: Adult Children    Expected Discharge Plan and Services In-house Referral: NA Discharge Planning Services: NA Post Acute Care Choice: Nursing Home, Resumption of Svcs/PTA Provider Living arrangements for the past 2 months: [place]                 DME Arranged: N/A DME Agency: NA                  Prior Living Arrangements/Services Living arrangements for the past 2 months: [place] Lives with:: Facility Resident Patient language and need for interpreter reviewed:: Yes Do you feel safe going back to the place where you live?: Yes      Need for Family Participation in Patient Care: Yes (Comment) Care giver support system in place?: Yes (comment) Current home services: DME Criminal Activity/Legal Involvement Pertinent to Current Situation/Hospitalization: No - Comment as needed  Activities of Daily Living Home Assistive Devices/Equipment: Other (Comment) (unable to answer questions patient is nonverbal) ADL  Screening (condition at time of admission) Patient's cognitive ability adequate to safely complete daily activities?: No Is the patient deaf or have difficulty hearing?: Yes Does the patient have difficulty seeing, even when wearing glasses/contacts?: Yes Does the patient have difficulty concentrating, remembering, or making decisions?: Yes Patient able to express need for assistance with ADLs?: No Does the patient have difficulty dressing or bathing?: Yes Independently performs ADLs?: No Communication: Dependent Is this a change from baseline?: Change from baseline, expected to last >3 days Dressing (OT): Dependent Grooming: Dependent Feeding: Needs assistance Bathing: Dependent Toileting: Dependent In/Out Bed: Dependent Walks in Home: Dependent Does the patient have difficulty walking or climbing stairs?: Yes Weakness of Legs: Both Weakness of Arms/Hands: Both  Permission Sought/Granted Permission sought to share information with : Facility Sport and exercise psychologist, Family Supports    Share Information with NAME: Cuong Soloff  Permission granted to share info w AGENCY: [facility]  Permission granted to share info w Relationship: Daughter  Permission granted to share info w Contact Information: [phone]  Emotional Assessment   Attitude/Demeanor/Rapport: Unable to Assess Affect (typically observed): Unable to Assess Orientation: :  (disoriented x 4) Alcohol / Substance Use: Not Applicable Psych Involvement: No (comment)  Admission diagnosis:  AKI (acute kidney injury) ([place]) [N17.9] Acute kidney injury ([place]) [N17.9] Patient Active Problem List   Diagnosis Date Noted   Leukocytosis 07/30/2022   CAP (community acquired pneumonia) 07/20/2022   Pressure injury of skin 07/20/2022   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   Chronic hepatitis  C ([place]) 07/20/2022   Protein-calorie malnutrition, severe ([place]) 07/20/2022   Rectal bleeding 07/20/2022   Normocytic anemia  06/28/2022   Hematuria 05/01/2022   History of CVA (cerebrovascular accident) 04/17/2022   BPH (benign prostatic hyperplasia) 04/17/2022   Malnutrition of moderate degree 04/15/2022   Acute kidney injury ([place]) 04/12/2022   Acute ischemic right PCA stroke ([place]) 03/25/2022   Primary hypertension 03/25/2022   Dysphagia due to recent cerebrovascular accident (CVA) 03/25/2022   Elevated liver enzymes 03/25/2022   Rhabdomyolysis 03/25/2022   Malignant neoplasm of prostate ([place]) 02/20/2021   PCP:  Arthur Holms, NP Pharmacy:   [facility], [place] [address] [place] [address] Phone: [phone] Fax: [phone]     Social Determinants of Health (SDOH) Social History: SDOH Screenings   Food Insecurity: No Food Insecurity (07/20/2022)  Housing: Low Risk  (07/20/2022)  Transportation Needs: No Transportation Needs (07/20/2022)  Utilities: Not At Risk (07/20/2022)  Tobacco Use: High Risk (07/20/2022)   SDOH Interventions:     Readmission Risk Interventions    07/31/2022   12:11 PM  Readmission Risk Prevention Plan  Transportation Screening Complete  PCP or Specialist Appt within 3-5 Days Complete  HRI or [place] Complete  Social Work Consult for [place] Planning/Counseling Complete  Palliative Care Screening Not Applicable  Medication Review Press photographer) Complete

## 2022-07-31 NOTE — NC FL2 (Signed)
[place] LEVEL OF CARE FORM     IDENTIFICATION  Patient Name: Jason Jones Birthdate: Oct 09, 1950 Sex: male Admission Date (Current Location): 07/30/2022  [facility] and [place] Number:  Herbalist and Address:  [facility],  [address], [place]      Provider Number: [id number]  Attending Physician Name and Address:  Georgette Shell, MD  Relative Name and Phone Number:  Daughter, Tahsin Carse [phone]    Current Level of Care: Hospital Recommended Level of Care: [place] Prior Approval Number:    Date Approved/Denied:   PASRR Number:    Discharge Plan: SNF    Current Diagnoses: Patient Active Problem List   Diagnosis Date Noted   Leukocytosis 07/30/2022   CAP (community acquired pneumonia) 07/20/2022   Pressure injury of skin 07/20/2022   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   Chronic hepatitis C ([place]) 07/20/2022   Protein-calorie malnutrition, severe ([place]) 07/20/2022   Rectal bleeding 07/20/2022   Normocytic anemia 06/28/2022   Hematuria 05/01/2022   History of CVA (cerebrovascular accident) 04/17/2022   BPH (benign prostatic hyperplasia) 04/17/2022   Malnutrition of moderate degree 04/15/2022   Acute kidney injury ([place]) 04/12/2022   Acute ischemic right PCA stroke ([place]) 03/25/2022   Primary hypertension 03/25/2022   Dysphagia due to recent cerebrovascular accident (CVA) 03/25/2022   Elevated liver enzymes 03/25/2022   Rhabdomyolysis 03/25/2022   Malignant neoplasm of prostate ([place]) 02/20/2021    Orientation RESPIRATION BLADDER Height & Weight      (Disoriented x 4)  O2 Incontinent, External catheter Weight:   Height:     BEHAVIORAL SYMPTOMS/MOOD NEUROLOGICAL BOWEL NUTRITION STATUS      Incontinent Diet (See discharge summary)  AMBULATORY STATUS COMMUNICATION OF NEEDS Skin   Total Care Verbally PU Stage and Appropriate Care (Foot Anterior;Left Stage 3 -  Full thickness  tissue loss.)     PU Stage 3 Dressing: No Dressing                 Personal Care Assistance Level of Assistance  Bathing, Feeding, Dressing, Total care Bathing Assistance: Maximum assistance Feeding assistance: Maximum assistance Dressing Assistance: Maximum assistance Total Care Assistance: Maximum assistance   Functional Limitations Info  Sight, Hearing, Speech Sight Info: Impaired Hearing Info: Impaired Speech Info: Adequate    SPECIAL CARE FACTORS FREQUENCY                       Contractures Contractures Info: Present    Additional Factors Info  Code Status, Allergies Code Status Info: DNR Allergies Info: Ceftriaxone, Doxycycline           Current Medications (07/31/2022):  This is the current hospital active medication list Current Facility-Administered Medications  Medication Dose Route Frequency Provider Last Rate Last Admin   0.9 %  sodium chloride infusion   Intravenous Continuous Georgette Shell, MD   Stopped at 07/31/22 1023   heparin injection 5,000 Units  5,000 Units Subcutaneous Q8H Georgette Shell, MD   5,000 Units at 07/31/22 [id number]   lactated ringers infusion   Intravenous Continuous Georgette Shell, MD 75 mL/hr at 07/31/22 1023 New Bag at 07/31/22 1023   linezolid (ZYVOX) tablet 600 mg  600 mg Oral Q12H Georgette Shell, MD         Discharge Medications: Please see discharge summary for a list of discharge medications.  Relevant Imaging Results:  Relevant Lab Results:   Additional Information SS#  [id number]  Vassie Moselle, LCSW

## 2022-07-31 NOTE — Consult Note (Signed)
Urology Consult   Physician requesting consult: Jacki Cones, MD  Reason for consult: urinary retention, AKI, non-functioning foley catheter  History of Present Illness: Jason Jones is a 72 y.o. male seen in consultation from Dr. Zigmund Daniel for urinary retention, acute kidney injury, and a nonfunctioning Foley catheter.  He has a history of T1c Gleason grade group 3 prostate cancer and is status post radiation therapy, completed in 2/23.  His last PSA from June 2023 was 5.68.  He has been followed by Dr. Lovena Neighbours at [facility].  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  He has been managed with a chronic Foley catheter.  He failed and at emptied voiding trial in the office in December 2023.  He has not returned for follow-up in the office since that time.  He has been living at [facility] and is bedbound.  He was admitted to the hospital on 07/30/2022 with acute change in mental status.  On presentation, he was found to have AKI with a creatinine of 4.70 and white blood cell count of 15.5.  By report, his Foley catheter was changed on 07/21/2022.  Urinalysis on admission showed >50 RBCs, >50 WBCs, rare bacteria.  Renal ultrasound from 07/31/2022 showed mild to moderate right hydronephrosis, moderate left hydronephrosis, marked distention of the urinary bladder measuring approximately 970 mL in volume.  Echogenic debris also noted in the dependent urinary bladder.  There was a masslike area in the dependent urinary bladder measuring up to 5.8 x 3.8 cm.  The patient's urinary catheter was not draining well.  Attempts by the nursing staff to replace the Foley catheter were unsuccessful.  Urologic consultation was requested. History was unable to be obtained from the patient due to his current mental status.   Past Medical History:  Diagnosis Date   Arthritis    Asthma    Cancer ([place])     Chronic alcohol abuse    Dyspnea    GERD (gastroesophageal reflux disease)    PRN  ---  TAKES BAKING SODA IN WATER   History of pneumothorax    04-28-2006  fell, left fx rib--  resolved w/ chest tube   Hypertension    Nocturia    Poor dental hygiene    Right inguinal hernia    Weak urinary stream     Past Surgical History:  Procedure Laterality Date   COLONOSCOPY  01/08/2021   GOLD SEED IMPLANT N/A 05/23/2021   Procedure: GOLD SEED IMPLANT;  Surgeon: Ceasar Mons, MD;  Location: [facility];  Service: Urology;  Laterality: N/A;   INGUINAL HERNIA REPAIR Right 04/23/2017   Procedure: OPEN RIGHT INGUINAL HERNIA REPAIR WITH MESH;  Surgeon: Kinsinger, Arta Bruce, MD;  Location: [place];  Service: General;  Laterality: Right;  GENERAL COMBINED WITH REGIONAL FOR POST OP PAIN    INSERTION OF MESH Right 04/23/2017   Procedure: INSERTION OF MESH;  Surgeon: Kinsinger, Arta Bruce, MD;  Location: [place];  Service: General;  Laterality: Right;  GENERAL COMBINED WITH REGIONAL FOR POST OP PAIN    LIPOMA EXCISION Right 02/22/2015   Procedure: RIGHT SHOULDER MASS EXCISION 15 CM SQ;  Surgeon: Mickeal Skinner, MD;  Location: [facility];  Service: General;  Laterality: Right;   SPACE OAR INSTILLATION N/A 05/23/2021   Procedure: SPACE OAR INSTILLATION;  Surgeon: Ceasar Mons, MD;  Location: [facility];  Service: Urology;  Laterality: N/A;   TRANSRECTAL ULTRASOUND N/A 05/23/2021   Procedure: TRANSRECTAL ULTRASOUND;  Surgeon: Ceasar Mons, MD;  Location: [facility];  Service: Urology;  Laterality: N/A;    Medications:  Scheduled Meds:  Chlorhexidine Gluconate Cloth  6 each Topical Daily   heparin  5,000 Units Subcutaneous Q8H   linezolid  600 mg Oral Q12H   Continuous Infusions:  lactated ringers 75 mL/hr at 07/31/22 1023   PRN Meds:.  Allergies:  Allergies  Allergen Reactions   Ceftriaxone Swelling and Other  (See Comments)    Possible lips swelling (dose 07/21/22) "Allergic," per facility   Doxycycline Other (See Comments)    "ALLERGIC," per facility document    Family History  Problem Relation Age of Onset   Prostate cancer Cousin    Prostate cancer Cousin     Social History:  reports that he has been smoking cigarettes. He has a 25.00 pack-year smoking history. He has never used smokeless tobacco. He reports that he does not currently use alcohol. He reports that he does not currently use drugs after having used the following drugs: Marijuana.  ROS: Unable to be attained from the patient due to altered mental status.  Physical Exam:  Vital signs in last 24 hours: Temp:  [97.6 F (36.4 C)-99.4 F (37.4 C)] 98 F (36.7 C) (02/21 1434) Pulse Rate:  [73-90] 86 (02/21 1434) Resp:  [12-21] 12 (02/21 1434) BP: (120-156)/(83-98) 124/84 (02/21 1434) SpO2:  [92 %-97 %] 95 % (02/21 1434) GENERAL APPEARANCE: Chronically ill appearing, lying in bed with contractures of lower extremities NAD HEENT:  Atraumatic, normocephalic, oropharynx clear NECK:  Supple  ABDOMEN:  Soft, non-tender, no masses EXTREMITIES:  Without clubbing, cyanosis, or edema; bandage on left upper extremity NEUROLOGIC: Left hemiparesis MENTAL STATUS: Altered BACK:  Non-tender to palpation, No CVAT SKIN:  Warm, dry, and intact GU: Foley catheter appears to be improperly positioned with minimal urine in the tube  Laboratory Data:  Recent Labs    07/30/22 1007 07/30/22 1415 07/31/22 0349  WBC 15.5* 18.5* 17.8*  HGB 9.7* 9.6* 9.4*  HCT 32.1* 31.3* 30.0*  PLT 463* 371 416*    Recent Labs    07/30/22 1007 07/30/22 1415 07/31/22 0349  NA 140  --  141  K 3.8  --  4.1  CL 104  --  107  GLUCOSE 123*  --  100*  BUN 59*  --  56*  CALCIUM 8.4*  --  8.4*  CREATININE 4.70* 3.68* 4.92*     Results for orders placed or performed during the hospital encounter of 07/30/22 (from the past 24 hour(s))  MRSA Next Gen by  PCR, Nasal     Status: Abnormal   Collection Time: 07/30/22 11:55 PM   Specimen: Nasal Mucosa; Nasal Swab  Result Value Ref Range   MRSA by PCR Next Gen DETECTED (A) NOT DETECTED  Comprehensive metabolic panel     Status: Abnormal   Collection Time: 07/31/22  3:49 AM  Result Value Ref Range   Sodium 141 135 - 145 mmol/L   Potassium 4.1 3.5 - 5.1 mmol/L   Chloride 107 98 - 111 mmol/L   CO2 21 (L) 22 - 32 mmol/L   Glucose, Bld 100 (H) 70 - 99 mg/dL   BUN 56 (H) 8 - 23 mg/dL   Creatinine, Ser 4.92 (H) 0.61 - 1.24 mg/dL   Calcium 8.4 (L) 8.9 - 10.3 mg/dL   Total Protein 7.2 6.5 - 8.1 g/dL   Albumin 2.3 (  L) 3.5 - 5.0 g/dL   AST 24 15 - 41 U/L   ALT 20 0 - 44 U/L   Alkaline Phosphatase 43 38 - 126 U/L   Total Bilirubin 0.6 0.3 - 1.2 mg/dL   GFR, Estimated 12 (L) >60 mL/min   Anion gap 13 5 - 15  CBC     Status: Abnormal   Collection Time: 07/31/22  3:49 AM  Result Value Ref Range   WBC 17.8 (H) 4.0 - 10.5 K/uL   RBC 3.50 (L) 4.22 - 5.81 MIL/uL   Hemoglobin 9.4 (L) 13.0 - 17.0 g/dL   HCT 30.0 (L) 39.0 - 52.0 %   MCV 85.7 80.0 - 100.0 fL   MCH 26.9 26.0 - 34.0 pg   MCHC 31.3 30.0 - 36.0 g/dL   RDW 16.7 (H) 11.5 - 15.5 %   Platelets 416 (H) 150 - 400 K/uL   nRBC 0.0 0.0 - 0.2 %  Procalcitonin     Status: None   Collection Time: 07/31/22  3:49 AM  Result Value Ref Range   Procalcitonin 0.44 ng/mL   Recent Results (from the past 240 hour(s))  Blood culture (routine x 2)     Status: None (Preliminary result)   Collection Time: 07/30/22 10:11 AM   Specimen: BLOOD  Result Value Ref Range Status   Specimen Description   Final    BLOOD BLOOD LEFT FOREARM Performed at [facility], [place] [address], [place], [place] [address]    Special Requests   Final    BOTTLES DRAWN AEROBIC AND ANAEROBIC Blood Culture adequate volume Performed at [facility] [address], [place], [place] [address]    Culture   Final    NO GROWTH < 24 HOURS Performed  at [facility] [address], [place], [place] [address]    Report Status PENDING  Incomplete  Resp panel by RT-PCR (RSV, Flu A&B, Covid) Anterior Nasal Swab     Status: None   Collection Time: 07/30/22 10:34 AM   Specimen: Anterior Nasal Swab  Result Value Ref Range Status   SARS Coronavirus 2 by RT PCR NEGATIVE NEGATIVE Final    Comment: (NOTE) SARS-CoV-2 target nucleic acids are NOT DETECTED.  The SARS-CoV-2 RNA is generally detectable in upper respiratory specimens during the acute phase of infection. The lowest concentration of SARS-CoV-2 viral copies this assay can detect is 138 copies/mL. A negative result does not preclude SARS-Cov-2 infection and should not be used as the sole basis for treatment or other patient management decisions. A negative result may occur with  improper specimen collection/handling, submission of specimen other than nasopharyngeal swab, presence of viral mutation(s) within the areas targeted by this assay, and inadequate number of viral copies(<138 copies/mL). A negative result must be combined with clinical observations, patient history, and epidemiological information. The expected result is Negative.  Fact Sheet for Patients:  EntrepreneurPulse.com.au  Fact Sheet for Healthcare Providers:  IncredibleEmployment.be  This test is no t yet approved or cleared by the [place] FDA and  has been authorized for detection and/or diagnosis of SARS-CoV-2 by FDA under an Emergency Use Authorization (EUA). This EUA will remain  in effect (meaning this test can be used) for the duration of the COVID-19 declaration under Section 564(b)(1) of the Act, 21 U.S.C.section 360bbb-3(b)(1), unless the authorization is terminated  or revoked sooner.       Influenza A by PCR NEGATIVE NEGATIVE Final   Influenza B by PCR  NEGATIVE NEGATIVE Final    Comment: (NOTE) The Xpert Xpress SARS-CoV-2/FLU/RSV plus assay is  intended as an aid in the diagnosis of influenza from Nasopharyngeal swab specimens and should not be used as a sole basis for treatment. Nasal washings and aspirates are unacceptable for Xpert Xpress SARS-CoV-2/FLU/RSV testing.  Fact Sheet for Patients: EntrepreneurPulse.com.au  Fact Sheet for Healthcare Providers: IncredibleEmployment.be  This test is not yet approved or cleared by the [place] FDA and has been authorized for detection and/or diagnosis of SARS-CoV-2 by FDA under an Emergency Use Authorization (EUA). This EUA will remain in effect (meaning this test can be used) for the duration of the COVID-19 declaration under Section 564(b)(1) of the Act, 21 U.S.C. section 360bbb-3(b)(1), unless the authorization is terminated or revoked.     Resp Syncytial Virus by PCR NEGATIVE NEGATIVE Final    Comment: (NOTE) Fact Sheet for Patients: EntrepreneurPulse.com.au  Fact Sheet for Healthcare Providers: IncredibleEmployment.be  This test is not yet approved or cleared by the [place] FDA and has been authorized for detection and/or diagnosis of SARS-CoV-2 by FDA under an Emergency Use Authorization (EUA). This EUA will remain in effect (meaning this test can be used) for the duration of the COVID-19 declaration under Section 564(b)(1) of the Act, 21 U.S.C. section 360bbb-3(b)(1), unless the authorization is terminated or revoked.  Performed at [facility], [place] [address], [place], [place] [address]   Blood culture (routine x 2)     Status: None (Preliminary result)   Collection Time: 07/30/22  2:15 PM   Specimen: BLOOD LEFT ARM  Result Value Ref Range Status   Specimen Description   Final    BLOOD LEFT ARM Performed at [facility], [place] [address] [place], [place] [address]    Special Requests   Final    BOTTLES DRAWN AEROBIC ONLY Blood Culture adequate  volume Performed at [facility] [address] [place], [place] [address]    Culture   Final    NO GROWTH < 12 HOURS Performed at [facility] [address] [place], [place] [address]    Report Status PENDING  Incomplete  MRSA Next Gen by PCR, Nasal     Status: Abnormal   Collection Time: 07/30/22 11:55 PM   Specimen: Nasal Mucosa; Nasal Swab  Result Value Ref Range Status   MRSA by PCR Next Gen DETECTED (A) NOT DETECTED Final    Comment: (NOTE) The GeneXpert MRSA Assay (FDA approved for NASAL specimens only), is one component of a comprehensive MRSA colonization surveillance program. It is not intended to diagnose MRSA infection nor to guide or monitor treatment for MRSA infections. Test performance is not FDA approved in patients less than 8 years old. Performed at [facility], [place] [address], [place], [place] [address]     Renal Function: Recent Labs    07/30/22 1007 07/30/22 1415 07/31/22 0349  CREATININE 4.70* 3.68* 4.92*   Estimated Creatinine Clearance: 11.6 mL/min (A) (by C-G formula based on SCr of 4.92 mg/dL (H)).  Radiologic Imaging: US RENAL  Result Date: 07/31/2022 CLINICAL DATA:  Acute kidney injury. EXAM: RENAL / URINARY TRACT ULTRASOUND COMPLETE COMPARISON:  Comparison made with imaging from November of 2023 FINDINGS: Right Kidney: Renal measurements: 11.0 x 6.2 x 7.7 cm = volume: 186 mL. Mild to moderate RIGHT-sided hydronephrosis. Transition point not evaluated. Marked cortical echogenicity of the RIGHT kidney with poor corticomedullary differentiation. Left Kidney: Renal measurements: 10.5 x 6.4  x 5.2 cm = volume: 185 mL. Marked increased parenchymal echogenicity with moderate LEFT-sided hydronephrosis. Bladder: Marked distension of the urinary bladder approximally 16 x 13 x 8.9 (volume = 970 cc) cm Echogenic debris in the dependent urinary bladder. Masslike area also present in the dependent urinary bladder  measuring up to 5.8 x 3.8 cm, not clearly arising from the prostate. Median lobe hypertrophy was seen on previous imaging. Other: None. IMPRESSION: Moderate bilateral hydronephrosis with marked urinary bladder distension. Findings are most suggestive of bladder outlet obstruction. Marked increased cortical echogenicity of the bilateral kidneys compatible with medical renal disease. Debris in the urinary bladder may be infectious or hemorrhagic. There is also a masslike appearance at the bladder base. Urologic consultation is suggested in this patient with history of prostate neoplasm. Cystoscopic follow-up may be warranted given the appearance of the bladder base to exclude concomitant urothelial neoplasm or bladder base invasion from prostate cancer. Electronically Signed   By: Zetta Bills M.D.   On: 07/31/2022 12:13   CT Head Wo Contrast  Result Date: 07/30/2022 CLINICAL DATA:  Mental status change, unknown cause EXAM: CT HEAD WITHOUT CONTRAST TECHNIQUE: Contiguous axial images were obtained from the base of the skull through the vertex without intravenous contrast. RADIATION DOSE REDUCTION: This exam was performed according to the departmental dose-optimization program which includes automated exposure control, adjustment of the mA and/or kV according to patient size and/or use of iterative reconstruction technique. COMPARISON:  MRI head March 25, 2022. FINDINGS: Brain: Encephalomalacia in the inferior right frontal lobe and right PCA territory. No evidence of acute large vascular territory infarct, acute hemorrhage, mass lesion or midline shift. No hydrocephalus. Vascular: No hyperdense vessel identified. Skull: No acute fracture. Sinuses/Orbits: Clear sinuses.  No acute orbital findings. Other: No mastoid effusions. IMPRESSION: 1. No evidence of acute intracranial abnormality. 2. Areas of encephalomalacia. Electronically Signed   By: Margaretha Sheffield M.D.   On: 07/30/2022 12:01   DG Chest Port 1  View  Result Date: 07/30/2022 CLINICAL DATA:  Fever and altered mental status EXAM: PORTABLE CHEST 1 VIEW COMPARISON:  Chest radiograph dated 05/22/2023 FINDINGS: Unchanged elevation of the left hemidiaphragm. Left basilar linear opacities. No pleural effusion or pneumothorax. The heart size and mediastinal contours are within normal limits. Multiple healed bilateral rib fractures. IMPRESSION: No active disease. Electronically Signed   By: Darrin Nipper M.D.   On: 07/30/2022 10:35    I personally viewed the renal ultrasound from 07/31/2022.  I personally reviewed the patient's records from [facility] as well as prior labs and imaging studies.  Procedure: Foley insertion  Foley catheter insertion is accomplished under sterile conditions using a 8F coude catheter. Ten ml of sterile water is left in the retention balloon. There is the immediate return of 1200 ml of urine.  The urine and was initially clear and became thick and purulent at the completion of bladder drainage.  Tolerated well without complications. Foley catheter is left to gravity drainage.  Impression/Recommendation Urinary retention AKI -likely secondary to urinary retention and nonfunctioning catheter Bilateral hydronephrosis -likely due to urinary retention and nonfunctioning catheter Prostate cancer -status post radiation therapy BPH with obtruction Catheter associated UTI   Continue Foley catheter Urine culture ordered given abnormal urine appearance Follow serial creatinines.  Expect renal function will improve with bladder drainage. Review of prior imaging studies show a large median lobe of the prostate.  This is likely what was visualized on the ultrasound. Will need outpatient follow-up with Dr. Lovena Neighbours for  prostate cancer. Please call for any problems with catheter during admission.  Michaelle Birks 07/31/2022, 4:09 PM

## 2022-07-31 NOTE — Progress Notes (Signed)
PROGRESS NOTE    Jason Jones  [id number] DOB: [birth year] DOA: 07/30/2022 PCP: Arthur Holms, NP    Brief Narrative:  Jason Jones is a 72 y.o. male with medical history significant of prior stroke with residual left upper and lower extremity weakness, dementia, prostate cancer, chronic indwelling Foley catheter.  Patient had stroke in November 2023 since then he has been living at [facility] as a long-term care resident.  Discussed with daughter, per daughter patient's baseline is he talks he eats he is bedbound.  For the last few days patient has not been eating or drinking well and has not been talking his baseline.  He is on a dysphagia 3 diet.  Nursing home sent him out to the hospital for concerns of possible new stroke versus sepsis.  Patient also had reportedly had fever at the nursing home.  History obtained from the ER records and the patient's daughter.  Patient not able to provide any history.  No chest pain shortness of breath or cough noted.  No nausea vomiting diarrhea noted. Urine in the Foley cloudy appearing. Patient was discharged from the hospital 07/24/2022 when he was admitted with.  Pneumonia fever of 101.6 with leukocytosis at that time.  He was treated with Levaquin.  PCCM was consulted at that time for pleural effusion who recommended to discharge to nursing home with Levaquin for total of 7 days which should have finished today.  Patient had swelling of his lips from ceftriaxone in his last hospital admission. His past medical history significant for asthma, GERD, hypertension, history of stroke, hepatitis C, vascular dementia, AKI.   On admission in the ER his labs were significant for man of 59 creatinine 4.70 sodium 140 potassium 3.8 albumin is 2.4 GFR 12 WBC 15.5, at the time of discharge his WBC count was 12.2 on 07/23/2022 CT head revealed no acute findings His Foley catheter was changed on 07/21/2022 Chest x-ray no active disease UA shows large amount of  leukocytes with proteins bacteria and more than 50 WBC. ED Course: He received IV fluids, vancomycin, Flagyl, aztreonam.  Assessment & Plan:   Active Problems:   Normocytic anemia   Malignant neoplasm of prostate (HCC)   Elevated liver enzymes   Rhabdomyolysis   Acute kidney injury ([place])   Malnutrition of moderate degree   BPH (benign prostatic hyperplasia)   Leukocytosis   #1 acute metabolic encephalopathy likely secondary to multifactorial causes.Patient admitted with fever not eating or drinking.  On admission found to be in AKI and possible urinary tract infection. Sepsis bundle was initiated in the ER.  His blood pressure on admission was 100/60 pulse was 90 respiration 12 saturation 95% on 2 L 07/31/2022 blood pressure improved to 156/86 with fluid resuscitation. Normal lactic acid Leukocytosis 15.5 >17.8 Continue IV fluids decreased to 75 cc an hour. Patient received vancomycin and aztreonam and Flagyl in the ER.  He is allergic to Rocephin and doxycycline.   #2 fever likely source urine.  Patient has BPH chronic Foley catheter and prostate cancer.  Unsure how high was his fever in the nursing home.  He was discharged from the hospital on 07/24/2022 on Levaquin for 7 days for pneumonia the last date would have been 07/30/2022. Prior urine culture with MRSA and MRSA screen positive.  He is allergic to doxycycline.  Not able to give vancomycin due to AKI. Zyvox twice daily   #3 AKI likely from prerenal with decreased p.o. intake.  Continue IV fluids at 75 cc an  hour.  Flush Foley and replace Foley.  Renal ultrasound.  Urine electrolytes.  Discussed with patient's daughter he is not a dialysis candidate.     #4 dementia on Zyprexa at the nursing home which I will hold tonight due to change in mental status   #5 hypertension restart home medications  #6 chronic anemia patient received 2 units of blood transfusion earlier this month.  Patient was seen by GI during that admission for  FOBT positive no colonoscopy was planned.  Will monitor H&H.   #7 CODE STATUS discussed with patient's daughter-DNR  Pressure Injury 07/20/22 Ankle Anterior;Left Unstageable - Full thickness tissue loss in which the base of the injury is covered by slough (yellow, tan, gray, green or brown) and/or eschar (tan, brown or black) in the wound bed. (Active)  07/20/22 1809  Location: Ankle  Location Orientation: Anterior;Left  Staging: Unstageable - Full thickness tissue loss in which the base of the injury is covered by slough (yellow, tan, gray, green or brown) and/or eschar (tan, brown or black) in the wound bed.  Wound Description (Comments):   Present on Admission: Yes  Dressing Type Foam - Lift dressing to assess site every shift 07/30/22 1832     Pressure Injury 07/20/22 Foot Anterior;Left Stage 3 -  Full thickness tissue loss. Subcutaneous fat may be visible but bone, tendon or muscle are NOT exposed. (Active)  07/20/22 1810  Location: Foot  Location Orientation: Anterior;Left  Staging: Stage 3 -  Full thickness tissue loss. Subcutaneous fat may be visible but bone, tendon or muscle are NOT exposed.  Wound Description (Comments):   Present on Admission: Yes  Dressing Type Foam - Lift dressing to assess site every shift 07/30/22 1832    Estimated body mass index is 18.56 kg/m as calculated from the following:   Height as of 07/20/22: 5' 11"$  (1.803 m).   Weight as of 07/20/22: 60.4 kg.  DVT prophylaxis: Heparin Code Status: DNR Family Communication: Discussed with daughter  disposition Plan:  Status is: Inpatient Remains inpatient appropriate because: Acute kidney injury  Consultants:  Nephrology  Procedures: Foley replaced 07/31/2022 Antimicrobials: zyvox Subjective:  Calling out for mama all nite and this am Objective: Vitals:   07/30/22 1436 07/30/22 1832 07/30/22 2243 07/31/22 0250  BP: (!) 143/81 (!) 120/98 (!) 145/83 (!) 156/86  Pulse: 66 73 90 77  Resp: 16 20 (!) 21  14  Temp: 99.2 F (37.3 C) 99.4 F (37.4 C) 98.4 F (36.9 C) 97.6 F (36.4 C)  TempSrc: Oral     SpO2: 100% 95% 92% 97%    Intake/Output Summary (Last 24 hours) at 07/31/2022 1101 Last data filed at 07/31/2022 [id number] Gross per 24 hour  Intake --  Output 575 ml  Net -575 ml   There were no vitals filed for this visit.  Examination: Foley in place urine appears dark  General exam: Appears chronically ill-appearing Respiratory system: Clear to auscultation. Respiratory effort normal. Cardiovascular system: S1 & S2 heard, RRR. No JVD, murmurs, rubs, gallops or clicks. No pedal edema. Gastrointestinal system: Abdomen is nondistended, soft and nontender. No organomegaly or masses felt. Normal bowel sounds heard. Central nervous system: Left hemiparesis Confused Extremities: Symmetric 5 x 5 power. Skin: No rashes, lesions or ulcers Psychiatry: Unable to assess    Data Reviewed: I have personally reviewed following labs and imaging studies  CBC: Recent Labs  Lab 07/30/22 1007 07/30/22 1415 07/31/22 0349  WBC 15.5* 18.5* 17.8*  NEUTROABS 13.3*  --   --  HGB 9.7* 9.6* 9.4*  HCT 32.1* 31.3* 30.0*  MCV 86.8 88.4 85.7  PLT 463* 371 [id number]*   Basic Metabolic Panel: Recent Labs  Lab 07/30/22 1007 07/30/22 1415 07/31/22 0349  NA 140  --  141  K 3.8  --  4.1  CL 104  --  107  CO2 24  --  21*  GLUCOSE 123*  --  100*  BUN 59*  --  56*  CREATININE 4.70* 3.68* 4.92*  CALCIUM 8.4*  --  8.4*   GFR: Estimated Creatinine Clearance: 11.6 mL/min (A) (by C-G formula based on SCr of 4.92 mg/dL (H)). Liver Function Tests: Recent Labs  Lab 07/30/22 1007 07/31/22 0349  AST 19 24  ALT 21 20  ALKPHOS 49 43  BILITOT 0.4 0.6  PROT 8.3* 7.2  ALBUMIN 2.4* 2.3*   No results for input(s): "LIPASE", "AMYLASE" in the last 168 hours. Recent Labs  Lab 07/30/22 1011  AMMONIA 20   Coagulation Profile: Recent Labs  Lab 07/30/22 1007  INR 1.2   Cardiac Enzymes: No results for  input(s): "CKTOTAL", "CKMB", "CKMBINDEX", "TROPONINI" in the last 168 hours. BNP (last 3 results) No results for input(s): "PROBNP" in the last 8760 hours. HbA1C: No results for input(s): "HGBA1C" in the last 72 hours. CBG: No results for input(s): "GLUCAP" in the last 168 hours. Lipid Profile: No results for input(s): "CHOL", "HDL", "LDLCALC", "TRIG", "CHOLHDL", "LDLDIRECT" in the last 72 hours. Thyroid Function Tests: No results for input(s): "TSH", "T4TOTAL", "FREET4", "T3FREE", "THYROIDAB" in the last 72 hours. Anemia Panel: No results for input(s): "VITAMINB12", "FOLATE", "FERRITIN", "TIBC", "IRON", "RETICCTPCT" in the last 72 hours. Sepsis Labs: Recent Labs  Lab 07/30/22 1008 07/30/22 1415 07/31/22 0349  PROCALCITON  --   --  0.44  LATICACIDVEN 1.1 1.1  --     Recent Results (from the past 240 hour(s))  Blood culture (routine x 2)     Status: None (Preliminary result)   Collection Time: 07/30/22 10:11 AM   Specimen: BLOOD  Result Value Ref Range Status   Specimen Description   Final    BLOOD BLOOD LEFT FOREARM Performed at [facility] [address], [place], [place] [address]    Special Requests   Final    BOTTLES DRAWN AEROBIC AND ANAEROBIC Blood Culture adequate volume Performed at [facility] [address], [place], [place] [address]    Culture   Final    NO GROWTH < 24 HOURS Performed at [facility] [address], [place], [place] [address]    Report Status PENDING  Incomplete  Resp panel by RT-PCR (RSV, Flu A&B, Covid) Anterior Nasal Swab     Status: None   Collection Time: 07/30/22 10:34 AM   Specimen: Anterior Nasal Swab  Result Value Ref Range Status   SARS Coronavirus 2 by RT PCR NEGATIVE NEGATIVE Final    Comment: (NOTE) SARS-CoV-2 target nucleic acids are NOT DETECTED.  The SARS-CoV-2 RNA is generally detectable in upper respiratory specimens during the acute phase of infection. The  lowest concentration of SARS-CoV-2 viral copies this assay can detect is 138 copies/mL. A negative result does not preclude SARS-Cov-2 infection and should not be used as the sole basis for treatment or other patient management decisions. A negative result may occur with  improper specimen collection/handling, submission of specimen other than nasopharyngeal swab, presence of viral mutation(s) within the areas targeted by this assay, and inadequate number of viral copies(<138 copies/mL). A negative  result must be combined with clinical observations, patient history, and epidemiological information. The expected result is Negative.  Fact Sheet for Patients:  EntrepreneurPulse.com.au  Fact Sheet for Healthcare Providers:  IncredibleEmployment.be  This test is no t yet approved or cleared by the [place] FDA and  has been authorized for detection and/or diagnosis of SARS-CoV-2 by FDA under an Emergency Use Authorization (EUA). This EUA will remain  in effect (meaning this test can be used) for the duration of the COVID-19 declaration under Section 564(b)(1) of the Act, 21 U.S.C.section 360bbb-3(b)(1), unless the authorization is terminated  or revoked sooner.       Influenza A by PCR NEGATIVE NEGATIVE Final   Influenza B by PCR NEGATIVE NEGATIVE Final    Comment: (NOTE) The Xpert Xpress SARS-CoV-2/FLU/RSV plus assay is intended as an aid in the diagnosis of influenza from Nasopharyngeal swab specimens and should not be used as a sole basis for treatment. Nasal washings and aspirates are unacceptable for Xpert Xpress SARS-CoV-2/FLU/RSV testing.  Fact Sheet for Patients: EntrepreneurPulse.com.au  Fact Sheet for Healthcare Providers: IncredibleEmployment.be  This test is not yet approved or cleared by the [place] FDA and has been authorized for detection and/or diagnosis of SARS-CoV-2 by FDA under  an Emergency Use Authorization (EUA). This EUA will remain in effect (meaning this test can be used) for the duration of the COVID-19 declaration under Section 564(b)(1) of the Act, 21 U.S.C. section 360bbb-3(b)(1), unless the authorization is terminated or revoked.     Resp Syncytial Virus by PCR NEGATIVE NEGATIVE Final    Comment: (NOTE) Fact Sheet for Patients: EntrepreneurPulse.com.au  Fact Sheet for Healthcare Providers: IncredibleEmployment.be  This test is not yet approved or cleared by the [place] FDA and has been authorized for detection and/or diagnosis of SARS-CoV-2 by FDA under an Emergency Use Authorization (EUA). This EUA will remain in effect (meaning this test can be used) for the duration of the COVID-19 declaration under Section 564(b)(1) of the Act, 21 U.S.C. section 360bbb-3(b)(1), unless the authorization is terminated or revoked.  Performed at [facility], [place] [address], [place], [place] [address]   Blood culture (routine x 2)     Status: None (Preliminary result)   Collection Time: 07/30/22  2:15 PM   Specimen: BLOOD LEFT ARM  Result Value Ref Range Status   Specimen Description   Final    BLOOD LEFT ARM Performed at [facility], [place] [address] [place], [place] [address]    Special Requests   Final    BOTTLES DRAWN AEROBIC ONLY Blood Culture adequate volume Performed at [facility] [address], [place], [place] [address]    Culture   Final    NO GROWTH < 12 HOURS Performed at [facility] [address], [place], [place] [address]    Report Status PENDING  Incomplete  MRSA Next Gen by PCR, Nasal     Status: Abnormal   Collection Time: 07/30/22 11:55 PM   Specimen: Nasal Mucosa; Nasal Swab  Result Value Ref Range Status   MRSA by PCR Next Gen DETECTED (A) NOT DETECTED Final    Comment: (NOTE) The GeneXpert MRSA Assay (FDA approved for NASAL  specimens only), is one component of a comprehensive MRSA colonization surveillance program. It is not intended to diagnose MRSA infection nor to guide or monitor treatment for MRSA infections. Test performance is not FDA approved in patients less than 74 years old. Performed at [facility], [place] [address] [place], [place] [address]          Radiology Studies: CT Head Wo Contrast  Result Date: 07/30/2022 CLINICAL DATA:  Mental status change, unknown cause EXAM: CT HEAD WITHOUT CONTRAST TECHNIQUE: Contiguous axial images were obtained from the base of the skull through the vertex without intravenous contrast. RADIATION DOSE REDUCTION: This exam was performed according to the departmental dose-optimization program which includes automated exposure control, adjustment of the mA and/or kV according to patient size and/or use of iterative reconstruction technique. COMPARISON:  MRI head March 25, 2022. FINDINGS: Brain: Encephalomalacia in the inferior right frontal lobe and right PCA territory. No evidence of acute large vascular territory infarct, acute hemorrhage, mass lesion or midline shift. No hydrocephalus. Vascular: No hyperdense vessel identified. Skull: No acute fracture. Sinuses/Orbits: Clear sinuses.  No acute orbital findings. Other: No mastoid effusions. IMPRESSION: 1. No evidence of acute intracranial abnormality. 2. Areas of encephalomalacia. Electronically Signed   By: Margaretha Sheffield M.D.   On: 07/30/2022 12:01   DG Chest Port 1 View  Result Date: 07/30/2022 CLINICAL DATA:  Fever and altered mental status EXAM: PORTABLE CHEST 1 VIEW COMPARISON:  Chest radiograph dated 05/22/2023 FINDINGS: Unchanged elevation of the left hemidiaphragm. Left basilar linear opacities. No pleural effusion or pneumothorax. The heart size and mediastinal contours are within normal limits. Multiple healed bilateral rib fractures. IMPRESSION: No active disease. Electronically Signed    By: Darrin Nipper M.D.   On: 07/30/2022 10:35        Scheduled Meds:  heparin  5,000 Units Subcutaneous Q8H   Continuous Infusions:  sodium chloride Stopped (07/31/22 1023)   lactated ringers 75 mL/hr at 07/31/22 1023     LOS: 1 day    Time spent: 5 min  Georgette Shell, MD  07/31/2022, 11:01 AM

## 2022-08-01 LAB — BASIC METABOLIC PANEL
Anion gap: 13 (ref 5–15)
BUN: 52 mg/dL — ABNORMAL HIGH (ref 8–23)
CO2: 20 mmol/L — ABNORMAL LOW (ref 22–32)
Calcium: 8.5 mg/dL — ABNORMAL LOW (ref 8.9–10.3)
Chloride: 108 mmol/L (ref 98–111)
Creatinine, Ser: 4.15 mg/dL — ABNORMAL HIGH (ref 0.61–1.24)
GFR, Estimated: 14 mL/min — ABNORMAL LOW (ref >60)
Glucose, Bld: 85 mg/dL (ref 70–99)
Potassium: 3.9 mmol/L (ref 3.5–5.1)
Sodium: 141 mmol/L (ref 135–145)

## 2022-08-01 LAB — CBC
HCT: 29.5 % — ABNORMAL LOW (ref 39.0–52.0)
Hemoglobin: 9.1 g/dL — ABNORMAL LOW (ref 13.0–17.0)
MCH: 26.6 pg (ref 26.0–34.0)
MCHC: 30.8 g/dL (ref 30.0–36.0)
MCV: 86.3 fL (ref 80.0–100.0)
Platelets: 392 10*3/uL (ref 150–400)
RBC: 3.42 MIL/uL — ABNORMAL LOW (ref 4.22–5.81)
RDW: 16.7 % — ABNORMAL HIGH (ref 11.5–15.5)
WBC: 14.3 10*3/uL — ABNORMAL HIGH (ref 4.0–10.5)
nRBC: 0 % (ref 0.0–0.2)

## 2022-08-01 MED ORDER — OLANZAPINE 5 MG PO TBDP
5.0000 mg | ORAL_TABLET | Freq: Every day | ORAL | Status: DC
  Administered 2022-08-01 – 2022-08-02 (×2): 5 mg via ORAL
  Filled 2022-08-01 (×3): qty 1

## 2022-08-01 MED ORDER — DOXAZOSIN MESYLATE 1 MG PO TABS
1.0000 mg | ORAL_TABLET | Freq: Every day | ORAL | Status: DC
  Administered 2022-08-01 – 2022-08-05 (×5): 1 mg via ORAL
  Filled 2022-08-01 (×5): qty 1

## 2022-08-01 MED ORDER — HYDROMORPHONE HCL 1 MG/ML IJ SOLN
0.5000 mg | Freq: Four times a day (QID) | INTRAMUSCULAR | Status: AC | PRN
  Administered 2022-08-01 (×2): 0.5 mg via INTRAVENOUS
  Filled 2022-08-01 (×2): qty 0.5

## 2022-08-01 MED ORDER — MUPIROCIN 2 % EX OINT
1.0000 | TOPICAL_OINTMENT | Freq: Two times a day (BID) | CUTANEOUS | Status: DC
  Administered 2022-08-01 – 2022-08-05 (×8): 1 via NASAL
  Filled 2022-08-01: qty 22

## 2022-08-01 MED ORDER — ACETAMINOPHEN 500 MG PO TABS
1000.0000 mg | ORAL_TABLET | Freq: Three times a day (TID) | ORAL | Status: DC | PRN
  Administered 2022-08-03: 1000 mg via ORAL
  Filled 2022-08-01 (×2): qty 2

## 2022-08-01 NOTE — Progress Notes (Signed)
Chaplain received a consult that Jason Jones was requesting prayer. Chaplain engaged Jason Jones in a conversation of support.  Jason Jones was difficult to understand both because of the volume of his voice and possibly because he was somewhat confused.  He did ask chaplain to pray, which was provided.  He also communicated the need for a blanket which was also provided.  If further needs arise, please page or consult [place].  [address] [place] Pager, [phone]

## 2022-08-01 NOTE — Progress Notes (Signed)
PROGRESS NOTE    Jason Jones  [id number] DOB: 1950/11/02 DOA: 07/30/2022 PCP: Arthur Holms, NP    Brief Narrative:  Jason Jones is a 72 y.o. male with medical history significant of prior stroke with residual left upper and lower extremity weakness, dementia, prostate cancer, chronic indwelling Foley catheter.  Patient had stroke in November 2023 since then he has been living at [facility] as a long-term care resident.  Discussed with daughter, per daughter patient's baseline is he talks he eats he is bedbound.  For the last few days patient has not been eating or drinking well and has not been talking his baseline.  He is on a dysphagia 3 diet.  Nursing home sent him out to the hospital for concerns of possible new stroke versus sepsis.  Patient also had reportedly had fever at the nursing home.  History obtained from the ER records and the patient's daughter.  Patient not able to provide any history.  No chest pain shortness of breath or cough noted.  No nausea vomiting diarrhea noted. Urine in the Foley cloudy appearing. Patient was discharged from the hospital 07/24/2022 when he was admitted with.  Pneumonia fever of 101.6 with leukocytosis at that time.  He was treated with Levaquin.  PCCM was consulted at that time for pleural effusion who recommended to discharge to nursing home with Levaquin for total of 7 days which should have finished today.  Patient had swelling of his lips from ceftriaxone in his last hospital admission. His past medical history significant for asthma, GERD, hypertension, history of stroke, hepatitis C, vascular dementia, AKI.   On admission in the ER his labs were significant for man of 59 creatinine 4.70 sodium 140 potassium 3.8 albumin is 2.4 GFR 12 WBC 15.5, at the time of discharge his WBC count was 12.2 on 07/23/2022 CT head revealed no acute findings His Foley catheter was changed on 07/21/2022 Chest x-ray no active disease UA shows large amount of  leukocytes with proteins bacteria and more than 50 WBC. ED Course: He received IV fluids, vancomycin, Flagyl, aztreonam.  Assessment & Plan:   Active Problems:   Normocytic anemia   Prostate cancer ([place])   Elevated liver enzymes   Rhabdomyolysis   Acute kidney injury ([place])   Malnutrition of moderate degree   BPH with urinary obstruction   Leukocytosis   Urinary retention   Hydronephrosis, bilateral   Urinary tract infection associated with indwelling urethral catheter ([place])   #1 acute metabolic encephalopathy likely secondary to multifactorial causes.Patient admitted with fever not eating or drinking.  On admission found to be in AKI and possible urinary tract infection. Creatinine 4.15 from 4.92 after placing Foley by urology. Continue slow IV fluids. Sepsis bundle was initiated in the ER.  His blood pressure on admission was 100/60 pulse was 90 respiration 12 saturation 95% on 2 L Normal lactic acid Leukocytosis 15.5 >17.8>14.3 Continue IV fluids decreased to 75 cc an hour. Patient received vancomycin and aztreonam and Flagyl in the ER.  He is allergic to Rocephin and doxycycline.   #2 fever likely source urine.  Patient has BPH chronic Foley catheter and prostate cancer.  He was discharged from the hospital on 07/24/2022 on Levaquin for 7 days for pneumonia the last date would have been 07/30/2022. Prior urine culture with MRSA and MRSA screen positive.  He is allergic to doxycycline.  Not able to give vancomycin due to AKI. Zyvox twice daily 2/21   #3 AKI-multifactorial secondary to acute urinary  retention secondary to BPH  prostate cancer and from prerenal with decreased p.o. intake.  Continue IV fluids at 75 cc an hour.   Renal functions improving after placement of Foley by urology.  Appreciate urology assistance. Renal ultra sound showed-Moderate bilateral hydronephrosis with marked urinary bladder distension. Findings are most suggestive of bladder  outlet obstruction.Marked increased cortical echogenicity of the bilateral kidneys compatible with medical renal disease.Debris in the urinary bladder may be infectious or hemorrhagic. There is also a masslike appearance at the bladder base. Urologic consultation is suggested in this patient with history of prostate neoplasm. Cystoscopic follow-up may be warranted given the appearance of the bladder base to exclude concomitant urothelial neoplasm or bladder base invasion from prostate cancer.   #4 dementia on Zyprexa    #5 hypertension restarted home medications  #6 chronic anemia patient received 2 units of blood transfusion earlier this month.  Patient was seen by GI during that admission for FOBT positive no colonoscopy was planned.  Will monitor H&H.   #7 CODE STATUS discussed with patient's daughter-DNR  Pressure Injury 07/20/22 Ankle Anterior;Left Unstageable - Full thickness tissue loss in which the base of the injury is covered by slough (yellow, tan, gray, green or brown) and/or eschar (tan, brown or black) in the wound bed. (Active)  07/20/22 1809  Location: Ankle  Location Orientation: Anterior;Left  Staging: Unstageable - Full thickness tissue loss in which the base of the injury is covered by slough (yellow, tan, gray, green or brown) and/or eschar (tan, brown or black) in the wound bed.  Wound Description (Comments):   Present on Admission: Yes  Dressing Type Foam - Lift dressing to assess site every shift 08/01/22 0819     Pressure Injury 07/20/22 Foot Anterior;Left Stage 3 -  Full thickness tissue loss. Subcutaneous fat may be visible but bone, tendon or muscle are NOT exposed. (Active)  07/20/22 1810  Location: Foot  Location Orientation: Anterior;Left  Staging: Stage 3 -  Full thickness tissue loss. Subcutaneous fat may be visible but bone, tendon or muscle are NOT exposed.  Wound Description (Comments):   Present on Admission: Yes  Dressing Type Foam - Lift dressing to  assess site every shift 08/01/22 0819    Estimated body mass index is 18.56 kg/m as calculated from the following:   Height as of 07/20/22: 5' 11"$  (1.803 m).   Weight as of 07/20/22: 60.4 kg.  DVT prophylaxis: Heparin Code Status: DNR Family Communication: Discussed with daughter  disposition Plan:  Status is: Inpatient Remains inpatient appropriate because: Acute kidney injury  Consultants:  Nephrology  Procedures: Foley replaced 07/31/2022 Antimicrobials: zyvox Subjective: Much more awake Asking to drink ice cold water Oriented to place Leukocytosis trending down Creatinine trending down slowly  Objective: Vitals:   07/31/22 0250 07/31/22 1434 07/31/22 2048 08/01/22 0530  BP: (!) 156/86 124/84 (!) 160/87 (!) 146/91  Pulse: 77 86 90 79  Resp: 14 12 17 14  $ Temp: 97.6 F (36.4 C) 98 F (36.7 C) 98.4 F (36.9 C) (!) 97.5 F (36.4 C)  TempSrc:      SpO2: 97% 95% 96% 96%    Intake/Output Summary (Last 24 hours) at 08/01/2022 0950 Last data filed at 08/01/2022 0340 Gross per 24 hour  Intake 1286.1 ml  Output 2870 ml  Net -1583.9 ml    There were no vitals filed for this visit.  Examination: Foley in place urine appears clear than yesterday Negative by 1583 General exam: Appears chronically ill-appearing Respiratory system: Clear  to auscultation. Respiratory effort normal. Cardiovascular system: S1 & S2 heard, RRR. No JVD, murmurs, rubs, gallops or clicks. No pedal edema. Gastrointestinal system: Abdomen is nondistended, soft and nontender. No organomegaly or masses felt. Normal bowel sounds heard. Central nervous system: Left hemiparesis Confused Extremities: Edema to left lower extremity kin: No rashes, lesions or ulcers Psychiatry: Unable to assess    Data Reviewed: I have personally reviewed following labs and imaging studies  CBC: Recent Labs  Lab 07/30/22 1007 07/30/22 1415 07/31/22 0349 08/01/22 0402  WBC 15.5* 18.5* 17.8* 14.3*  NEUTROABS 13.3*   --   --   --   HGB 9.7* 9.6* 9.4* 9.1*  HCT 32.1* 31.3* 30.0* 29.5*  MCV 86.8 88.4 85.7 86.3  PLT 463* 371 416* 0000000    Basic Metabolic Panel: Recent Labs  Lab 07/30/22 1007 07/30/22 1415 07/31/22 0349 07/31/22 1655 08/01/22 0402  NA 140  --  141 143 141  K 3.8  --  4.1 4.1 3.9  CL 104  --  107 110 108  CO2 24  --  21* 19* 20*  GLUCOSE 123*  --  100* 92 85  BUN 59*  --  56* 58* 52*  CREATININE 4.70* 3.68* 4.92* 4.79* 4.15*  CALCIUM 8.4*  --  8.4* 8.1* 8.5*    GFR: Estimated Creatinine Clearance: 13.7 mL/min (A) (by C-G formula based on SCr of 4.15 mg/dL (H)). Liver Function Tests: Recent Labs  Lab 07/30/22 1007 07/31/22 0349  AST 19 24  ALT 21 20  ALKPHOS 49 43  BILITOT 0.4 0.6  PROT 8.3* 7.2  ALBUMIN 2.4* 2.3*    No results for input(s): "LIPASE", "AMYLASE" in the last 168 hours. Recent Labs  Lab 07/30/22 1011  AMMONIA 20    Coagulation Profile: Recent Labs  Lab 07/30/22 1007  INR 1.2    Cardiac Enzymes: No results for input(s): "CKTOTAL", "CKMB", "CKMBINDEX", "TROPONINI" in the last 168 hours. BNP (last 3 results) No results for input(s): "PROBNP" in the last 8760 hours. HbA1C: No results for input(s): "HGBA1C" in the last 72 hours. CBG: No results for input(s): "GLUCAP" in the last 168 hours. Lipid Profile: No results for input(s): "CHOL", "HDL", "LDLCALC", "TRIG", "CHOLHDL", "LDLDIRECT" in the last 72 hours. Thyroid Function Tests: No results for input(s): "TSH", "T4TOTAL", "FREET4", "T3FREE", "THYROIDAB" in the last 72 hours. Anemia Panel: No results for input(s): "VITAMINB12", "FOLATE", "FERRITIN", "TIBC", "IRON", "RETICCTPCT" in the last 72 hours. Sepsis Labs: Recent Labs  Lab 07/30/22 1008 07/30/22 1415 07/31/22 0349  PROCALCITON  --   --  0.44  LATICACIDVEN 1.1 1.1  --      Recent Results (from the past 240 hour(s))  Blood culture (routine x 2)     Status: None (Preliminary result)   Collection Time: 07/30/22 10:11 AM    Specimen: BLOOD  Result Value Ref Range Status   Specimen Description   Final    BLOOD BLOOD LEFT FOREARM Performed at [facility] [address] [place], [place] [address]    Special Requests   Final    BOTTLES DRAWN AEROBIC AND ANAEROBIC Blood Culture adequate volume Performed at [facility] [address] [place], [place] [address]    Culture   Final    NO GROWTH 2 DAYS Performed at [facility] [address], [place],  [address]    Report Status PENDING  Incomplete  Resp panel by RT-PCR (RSV, Flu A&B, Covid) Anterior Nasal Swab  Status: None   Collection Time: 07/30/22 10:34 AM   Specimen: Anterior Nasal Swab  Result Value Ref Range Status   SARS Coronavirus 2 by RT PCR NEGATIVE NEGATIVE Final    Comment: (NOTE) SARS-CoV-2 target nucleic acids are NOT DETECTED.  The SARS-CoV-2 RNA is generally detectable in upper respiratory specimens during the acute phase of infection. The lowest concentration of SARS-CoV-2 viral copies this assay can detect is 138 copies/mL. A negative result does not preclude SARS-Cov-2 infection and should not be used as the sole basis for treatment or other patient management decisions. A negative result may occur with  improper specimen collection/handling, submission of specimen other than nasopharyngeal swab, presence of viral mutation(s) within the areas targeted by this assay, and inadequate number of viral copies(<138 copies/mL). A negative result must be combined with clinical observations, patient history, and epidemiological information. The expected result is Negative.  Fact Sheet for Patients:  EntrepreneurPulse.com.au  Fact Sheet for Healthcare Providers:  IncredibleEmployment.be  This test is no t yet approved or cleared by the [place] FDA and  has been authorized for detection and/or diagnosis of SARS-CoV-2 by FDA under an  Emergency Use Authorization (EUA). This EUA will remain  in effect (meaning this test can be used) for the duration of the COVID-19 declaration under Section 564(b)(1) of the Act, 21 U.S.C.section 360bbb-3(b)(1), unless the authorization is terminated  or revoked sooner.       Influenza A by PCR NEGATIVE NEGATIVE Final   Influenza B by PCR NEGATIVE NEGATIVE Final    Comment: (NOTE) The Xpert Xpress SARS-CoV-2/FLU/RSV plus assay is intended as an aid in the diagnosis of influenza from Nasopharyngeal swab specimens and should not be used as a sole basis for treatment. Nasal washings and aspirates are unacceptable for Xpert Xpress SARS-CoV-2/FLU/RSV testing.  Fact Sheet for Patients: EntrepreneurPulse.com.au  Fact Sheet for Healthcare Providers: IncredibleEmployment.be  This test is not yet approved or cleared by the [place] FDA and has been authorized for detection and/or diagnosis of SARS-CoV-2 by FDA under an Emergency Use Authorization (EUA). This EUA will remain in effect (meaning this test can be used) for the duration of the COVID-19 declaration under Section 564(b)(1) of the Act, 21 U.S.C. section 360bbb-3(b)(1), unless the authorization is terminated or revoked.     Resp Syncytial Virus by PCR NEGATIVE NEGATIVE Final    Comment: (NOTE) Fact Sheet for Patients: EntrepreneurPulse.com.au  Fact Sheet for Healthcare Providers: IncredibleEmployment.be  This test is not yet approved or cleared by the [place] FDA and has been authorized for detection and/or diagnosis of SARS-CoV-2 by FDA under an Emergency Use Authorization (EUA). This EUA will remain in effect (meaning this test can be used) for the duration of the COVID-19 declaration under Section 564(b)(1) of the Act, 21 U.S.C. section 360bbb-3(b)(1), unless the authorization is terminated or revoked.  Performed at [facility], [place] [address] [place], [place] [address]   Blood culture (routine x 2)     Status: None (Preliminary result)   Collection Time: 07/30/22  2:15 PM   Specimen: BLOOD LEFT ARM  Result Value Ref Range Status   Specimen Description   Final    BLOOD LEFT ARM Performed at [facility], [place] [address] [place], [place] [address]    Special Requests   Final    BOTTLES DRAWN AEROBIC ONLY Blood Culture adequate volume Performed at [facility] [address], [place], [place] [address]    Culture  Final    NO GROWTH 2 DAYS Performed at [facility], [place] [address] [place], [place] [address]    Report Status PENDING  Incomplete  MRSA Next Gen by PCR, Nasal     Status: Abnormal   Collection Time: 07/30/22 11:55 PM   Specimen: Nasal Mucosa; Nasal Swab  Result Value Ref Range Status   MRSA by PCR Next Gen DETECTED (A) NOT DETECTED Final    Comment: (NOTE) The GeneXpert MRSA Assay (FDA approved for NASAL specimens only), is one component of a comprehensive MRSA colonization surveillance program. It is not intended to diagnose MRSA infection nor to guide or monitor treatment for MRSA infections. Test performance is not FDA approved in patients less than 45 years old. Performed at [facility], [place] [address], [place],  [address]          Radiology Studies: US RENAL  Result Date: 07/31/2022 CLINICAL DATA:  Acute kidney injury. EXAM: RENAL / URINARY TRACT ULTRASOUND COMPLETE COMPARISON:  Comparison made with imaging from November of 2023 FINDINGS: Right Kidney: Renal measurements: 11.0 x 6.2 x 7.7 cm = volume: 186 mL. Mild to moderate RIGHT-sided hydronephrosis. Transition point not evaluated. Marked cortical echogenicity of the RIGHT kidney with poor corticomedullary differentiation. Left Kidney: Renal measurements: 10.5 x 6.4 x 5.2 cm = volume: 185 mL. Marked increased parenchymal echogenicity with moderate LEFT-sided  hydronephrosis. Bladder: Marked distension of the urinary bladder approximally 16 x 13 x 8.9 (volume = 970 cc) cm Echogenic debris in the dependent urinary bladder. Masslike area also present in the dependent urinary bladder measuring up to 5.8 x 3.8 cm, not clearly arising from the prostate. Median lobe hypertrophy was seen on previous imaging. Other: None. IMPRESSION: Moderate bilateral hydronephrosis with marked urinary bladder distension. Findings are most suggestive of bladder outlet obstruction. Marked increased cortical echogenicity of the bilateral kidneys compatible with medical renal disease. Debris in the urinary bladder may be infectious or hemorrhagic. There is also a masslike appearance at the bladder base. Urologic consultation is suggested in this patient with history of prostate neoplasm. Cystoscopic follow-up may be warranted given the appearance of the bladder base to exclude concomitant urothelial neoplasm or bladder base invasion from prostate cancer. Electronically Signed   By: Zetta Bills M.D.   On: 07/31/2022 12:13   CT Head Wo Contrast  Result Date: 07/30/2022 CLINICAL DATA:  Mental status change, unknown cause EXAM: CT HEAD WITHOUT CONTRAST TECHNIQUE: Contiguous axial images were obtained from the base of the skull through the vertex without intravenous contrast. RADIATION DOSE REDUCTION: This exam was performed according to the departmental dose-optimization program which includes automated exposure control, adjustment of the mA and/or kV according to patient size and/or use of iterative reconstruction technique. COMPARISON:  MRI head March 25, 2022. FINDINGS: Brain: Encephalomalacia in the inferior right frontal lobe and right PCA territory. No evidence of acute large vascular territory infarct, acute hemorrhage, mass lesion or midline shift. No hydrocephalus. Vascular: No hyperdense vessel identified. Skull: No acute fracture. Sinuses/Orbits: Clear sinuses.  No acute orbital  findings. Other: No mastoid effusions. IMPRESSION: 1. No evidence of acute intracranial abnormality. 2. Areas of encephalomalacia. Electronically Signed   By: Margaretha Sheffield M.D.   On: 07/30/2022 12:01   DG Chest Port 1 View  Result Date: 07/30/2022 CLINICAL DATA:  Fever and altered mental status EXAM: PORTABLE CHEST 1 VIEW COMPARISON:  Chest radiograph dated 05/22/2023 FINDINGS: Unchanged elevation of the left hemidiaphragm. Left basilar linear opacities. No pleural  effusion or pneumothorax. The heart size and mediastinal contours are within normal limits. Multiple healed bilateral rib fractures. IMPRESSION: No active disease. Electronically Signed   By: Darrin Nipper M.D.   On: 07/30/2022 10:35        Scheduled Meds:  Chlorhexidine Gluconate Cloth  6 each Topical Daily   heparin  5,000 Units Subcutaneous Q8H   linezolid  600 mg Oral Q12H   Continuous Infusions:     LOS: 2 days    Time spent: 39 min  Georgette Shell, MD  08/01/2022, 9:50 AM

## 2022-08-02 LAB — BASIC METABOLIC PANEL
Anion gap: 12 (ref 5–15)
BUN: 44 mg/dL — ABNORMAL HIGH (ref 8–23)
CO2: 24 mmol/L (ref 22–32)
Calcium: 8.2 mg/dL — ABNORMAL LOW (ref 8.9–10.3)
Chloride: 108 mmol/L (ref 98–111)
Creatinine, Ser: 3.06 mg/dL — ABNORMAL HIGH (ref 0.61–1.24)
GFR, Estimated: 21 mL/min — ABNORMAL LOW (ref >60)
Glucose, Bld: 97 mg/dL (ref 70–99)
Potassium: 3.2 mmol/L — ABNORMAL LOW (ref 3.5–5.1)
Sodium: 144 mmol/L (ref 135–145)

## 2022-08-02 LAB — URINE CULTURE: Culture: >=100000 — AB

## 2022-08-02 LAB — CBC
HCT: 26.9 % — ABNORMAL LOW (ref 39.0–52.0)
Hemoglobin: 8.4 g/dL — ABNORMAL LOW (ref 13.0–17.0)
MCH: 26.5 pg (ref 26.0–34.0)
MCHC: 31.2 g/dL (ref 30.0–36.0)
MCV: 84.9 fL (ref 80.0–100.0)
Platelets: 351 10*3/uL (ref 150–400)
RBC: 3.17 MIL/uL — ABNORMAL LOW (ref 4.22–5.81)
RDW: 16.3 % — ABNORMAL HIGH (ref 11.5–15.5)
WBC: 10.4 10*3/uL (ref 4.0–10.5)
nRBC: 0 % (ref 0.0–0.2)

## 2022-08-02 NOTE — Care Management Important Message (Signed)
Important Message  Patient Details IM Letter given. Name: Jason Jones MRN: [id number] Date of Birth: 1950/06/19   Medicare Important Message Given:  Yes     Kerin Salen 08/02/2022, 9:57 AM

## 2022-08-02 NOTE — Progress Notes (Signed)
PROGRESS NOTE    Jason Jones  [id number] DOB: 05-13-1951 DOA: 07/30/2022 PCP: Arthur Holms, NP    Brief Narrative:  Jason Jones is a 72 y.o. male with medical history significant of prior stroke with residual left upper and lower extremity weakness, dementia, prostate cancer, chronic indwelling Foley catheter.  Patient had stroke in November 2023 since then he has been living at [facility] as a long-term care resident.  Discussed with daughter, per daughter patient's baseline is he talks he eats he is bedbound.  For the last few days patient has not been eating or drinking well and has not been talking his baseline.  He is on a dysphagia 3 diet.  Nursing home sent him out to the hospital for concerns of possible new stroke versus sepsis.  Patient also had reportedly had fever at the nursing home.  History obtained from the ER records and the patient's daughter.  Patient not able to provide any history.  No chest pain shortness of breath or cough noted.  No nausea vomiting diarrhea noted. Urine in the Foley cloudy appearing. Patient was discharged from the hospital 07/24/2022 when he was admitted with.  Pneumonia fever of 101.6 with leukocytosis at that time.  He was treated with Levaquin.  PCCM was consulted at that time for pleural effusion who recommended to discharge to nursing home with Levaquin for total of 7 days which should have finished today.  Patient had swelling of his lips from ceftriaxone in his last hospital admission. His past medical history significant for asthma, GERD, hypertension, history of stroke, hepatitis C, vascular dementia, AKI.   On admission in the ER his labs were significant for man of 59 creatinine 4.70 sodium 140 potassium 3.8 albumin is 2.4 GFR 12 WBC 15.5, at the time of discharge his WBC count was 12.2 on 07/23/2022 CT head revealed no acute findings His Foley catheter was changed on 07/21/2022 Chest x-ray no active disease UA shows large amount of  leukocytes with proteins bacteria and more than 50 WBC. ED Course: He received IV fluids, vancomycin, Flagyl, aztreonam.  Assessment & Plan:   Active Problems:   Normocytic anemia   Prostate cancer ([place])   Elevated liver enzymes   Rhabdomyolysis   Acute kidney injury ([place])   Malnutrition of moderate degree   BPH with urinary obstruction   Leukocytosis   Urinary retention   Hydronephrosis, bilateral   Urinary tract infection associated with indwelling urethral catheter ([place])   #1 acute metabolic encephalopathy likely secondary to multifactorial causes.Patient admitted with fever not eating or drinking.  On admission found to be in AKI and possible urinary tract infection. Creatinine 3.06 from  4.15 from 4.92 after placing Foley by urology. Continue slow IV fluids. Sepsis bundle was initiated in the ER.  His blood pressure on admission was 100/60 pulse was 90 respiration 12 saturation 95% on 2 L Normal lactic acid Leukocytosis  resolved 15.5 >17.8>14.3 Patient received vancomycin and aztreonam and Flagyl in the ER.  He is allergic to Rocephin and doxycycline.   #2 fever likely source urine.  Patient has BPH chronic Foley catheter and prostate cancer.  He was discharged from the hospital on 07/24/2022 on Levaquin for 7 days for pneumonia the last date would have been 07/30/2022. Prior urine culture with MRSA and MRSA screen positive.  He is allergic to doxycycline.  Not able to give vancomycin due to AKI. Zyvox twice daily 2/21 Urine culture MRSA   #3 AKI-improving multifactorial secondary to acute urinary  retention secondary to BPH  prostate cancer and from prerenal with decreased p.o. intake. Renal functions improving after placement of Foley by urology.  Appreciate urology assistance. Renal ultra sound showed-Moderate bilateral hydronephrosis with marked urinary bladder distension. Findings are most suggestive of bladder outlet obstruction.Marked increased cortical echogenicity of  the bilateral kidneys compatible with medical renal disease.Debris in the urinary bladder may be infectious or hemorrhagic. There is also a masslike appearance at the bladder base. Urologic consultation is suggested in this patient with history of prostate neoplasm. Cystoscopic follow-up may be warranted given the appearance of the bladder base to exclude concomitant urothelial neoplasm or bladder base invasion from prostate cancer.   #4 dementia on Zyprexa    #5 hypertension restarted home medications  #6 chronic anemia patient received 2 units of blood transfusion earlier this month.  Patient was seen by GI during that admission for FOBT positive no colonoscopy was planned.  Will monitor H&H.   #7 CODE STATUS discussed with patient's daughter-DNR  Pressure Injury 07/20/22 Ankle Anterior;Left Unstageable - Full thickness tissue loss in which the base of the injury is covered by slough (yellow, tan, gray, green or brown) and/or eschar (tan, brown or black) in the wound bed. (Active)  07/20/22 1809  Location: Ankle  Location Orientation: Anterior;Left  Staging: Unstageable - Full thickness tissue loss in which the base of the injury is covered by slough (yellow, tan, gray, green or brown) and/or eschar (tan, brown or black) in the wound bed.  Wound Description (Comments):   Present on Admission: Yes  Dressing Type Foam - Lift dressing to assess site every shift 08/01/22 2000     Pressure Injury 07/20/22 Foot Anterior;Left Stage 3 -  Full thickness tissue loss. Subcutaneous fat may be visible but bone, tendon or muscle are NOT exposed. (Active)  07/20/22 1810  Location: Foot  Location Orientation: Anterior;Left  Staging: Stage 3 -  Full thickness tissue loss. Subcutaneous fat may be visible but bone, tendon or muscle are NOT exposed.  Wound Description (Comments):   Present on Admission: Yes  Dressing Type Foam - Lift dressing to assess site every shift 08/01/22 2000    Estimated body  mass index is 18.56 kg/m as calculated from the following:   Height as of 07/20/22: '5\' 11"'$  (1.803 m).   Weight as of 07/20/22: 60.4 kg.  DVT prophylaxis: Heparin Code Status: DNR Family Communication: Discussed with daughter  disposition Plan:  Status is: Inpatient Remains inpatient appropriate because: Acute kidney injury  Consultants:  Nephrology  Procedures: Foley replaced 07/31/2022 Antimicrobials: zyvox Subjective: Overnight staff reports patient is hallucinating.  He is awake this morning he is oriented to place he follows some simple commands asking for ice cold water denies any pain had a bowel movement yesterday  Objective: Vitals:   08/01/22 1406 08/01/22 1953 08/02/22 0546 08/02/22 1409  BP: (!) 141/75 131/74 (!) 140/70 124/76  Pulse: 84 88 82 84  Resp: '16  18 18  '$ Temp: 98.7 F (37.1 C) 97.9 F (36.6 C)  98 F (36.7 C)  TempSrc:    Oral  SpO2: 98% 99% 95% 98%    Intake/Output Summary (Last 24 hours) at 08/02/2022 1426 Last data filed at 08/02/2022 1400 Gross per 24 hour  Intake 310 ml  Output 4900 ml  Net -4590 ml    There were no vitals filed for this visit.  Examination: Foley in place urine appears clear than yesterday Negative by 1583 General exam: Appears chronically ill-appearing Respiratory system: Clear  to auscultation. Respiratory effort normal. Cardiovascular system: S1 & S2 heard, RRR. No JVD, murmurs, rubs, gallops or clicks. No pedal edema. Gastrointestinal system: Abdomen is nondistended, soft and nontender. No organomegaly or masses felt. Normal bowel sounds heard. Central nervous system: Left hemiparesis Confused Extremities: Edema to left lower extremity kin: No rashes, lesions or ulcers Psychiatry: Unable to assess    Data Reviewed: I have personally reviewed following labs and imaging studies  CBC: Recent Labs  Lab 07/30/22 1007 07/30/22 1415 07/31/22 0349 08/01/22 0402 08/02/22 0340  WBC 15.5* 18.5* 17.8* 14.3* 10.4   NEUTROABS 13.3*  --   --   --   --   HGB 9.7* 9.6* 9.4* 9.1* 8.4*  HCT 32.1* 31.3* 30.0* 29.5* 26.9*  MCV 86.8 88.4 85.7 86.3 84.9  PLT 463* 371 416* 392 [id number]    Basic Metabolic Panel: Recent Labs  Lab 07/30/22 1007 07/30/22 1415 07/31/22 0349 07/31/22 1655 08/01/22 0402 08/02/22 0340  NA 140  --  141 143 141 144  K 3.8  --  4.1 4.1 3.9 3.2*  CL 104  --  107 110 108 108  CO2 24  --  21* 19* 20* 24  GLUCOSE 123*  --  100* 92 85 97  BUN 59*  --  56* 58* 52* 44*  CREATININE 4.70* 3.68* 4.92* 4.79* 4.15* 3.06*  CALCIUM 8.4*  --  8.4* 8.1* 8.5* 8.2*    GFR: Estimated Creatinine Clearance: 18.6 mL/min (A) (by C-G formula based on SCr of 3.06 mg/dL (H)). Liver Function Tests: Recent Labs  Lab 07/30/22 1007 07/31/22 0349  AST 19 24  ALT 21 20  ALKPHOS 49 43  BILITOT 0.4 0.6  PROT 8.3* 7.2  ALBUMIN 2.4* 2.3*    No results for input(s): "LIPASE", "AMYLASE" in the last 168 hours. Recent Labs  Lab 07/30/22 1011  AMMONIA 20    Coagulation Profile: Recent Labs  Lab 07/30/22 1007  INR 1.2    Cardiac Enzymes: No results for input(s): "CKTOTAL", "CKMB", "CKMBINDEX", "TROPONINI" in the last 168 hours. BNP (last 3 results) No results for input(s): "PROBNP" in the last 8760 hours. HbA1C: No results for input(s): "HGBA1C" in the last 72 hours. CBG: No results for input(s): "GLUCAP" in the last 168 hours. Lipid Profile: No results for input(s): "CHOL", "HDL", "LDLCALC", "TRIG", "CHOLHDL", "LDLDIRECT" in the last 72 hours. Thyroid Function Tests: No results for input(s): "TSH", "T4TOTAL", "FREET4", "T3FREE", "THYROIDAB" in the last 72 hours. Anemia Panel: No results for input(s): "VITAMINB12", "FOLATE", "FERRITIN", "TIBC", "IRON", "RETICCTPCT" in the last 72 hours. Sepsis Labs: Recent Labs  Lab 07/30/22 1008 07/30/22 1415 07/31/22 0349  PROCALCITON  --   --  0.44  LATICACIDVEN 1.1 1.1  --      Recent Results (from the past 240 hour(s))  Blood culture  (routine x 2)     Status: None (Preliminary result)   Collection Time: 07/30/22 10:11 AM   Specimen: BLOOD  Result Value Ref Range Status   Specimen Description   Final    BLOOD BLOOD LEFT FOREARM Performed at [facility] [address], [place], [place] [address]    Special Requests   Final    BOTTLES DRAWN AEROBIC AND ANAEROBIC Blood Culture adequate volume Performed at [facility] [address], [place], [place] [address]    Culture   Final    NO GROWTH 3 DAYS Performed at [facility], [place] [address], [place], [place] [address]  Report Status PENDING  Incomplete  Resp panel by RT-PCR (RSV, Flu A&B, Covid) Anterior Nasal Swab     Status: None   Collection Time: 07/30/22 10:34 AM   Specimen: Anterior Nasal Swab  Result Value Ref Range Status   SARS Coronavirus 2 by RT PCR NEGATIVE NEGATIVE Final    Comment: (NOTE) SARS-CoV-2 target nucleic acids are NOT DETECTED.  The SARS-CoV-2 RNA is generally detectable in upper respiratory specimens during the acute phase of infection. The lowest concentration of SARS-CoV-2 viral copies this assay can detect is 138 copies/mL. A negative result does not preclude SARS-Cov-2 infection and should not be used as the sole basis for treatment or other patient management decisions. A negative result may occur with  improper specimen collection/handling, submission of specimen other than nasopharyngeal swab, presence of viral mutation(s) within the areas targeted by this assay, and inadequate number of viral copies(<138 copies/mL). A negative result must be combined with clinical observations, patient history, and epidemiological information. The expected result is Negative.  Fact Sheet for Patients:  EntrepreneurPulse.com.au  Fact Sheet for Healthcare Providers:  IncredibleEmployment.be  This test is no t yet approved or cleared by the [place] FDA and   has been authorized for detection and/or diagnosis of SARS-CoV-2 by FDA under an Emergency Use Authorization (EUA). This EUA will remain  in effect (meaning this test can be used) for the duration of the COVID-19 declaration under Section 564(b)(1) of the Act, 21 U.S.C.section 360bbb-3(b)(1), unless the authorization is terminated  or revoked sooner.       Influenza A by PCR NEGATIVE NEGATIVE Final   Influenza B by PCR NEGATIVE NEGATIVE Final    Comment: (NOTE) The Xpert Xpress SARS-CoV-2/FLU/RSV plus assay is intended as an aid in the diagnosis of influenza from Nasopharyngeal swab specimens and should not be used as a sole basis for treatment. Nasal washings and aspirates are unacceptable for Xpert Xpress SARS-CoV-2/FLU/RSV testing.  Fact Sheet for Patients: EntrepreneurPulse.com.au  Fact Sheet for Healthcare Providers: IncredibleEmployment.be  This test is not yet approved or cleared by the [place] FDA and has been authorized for detection and/or diagnosis of SARS-CoV-2 by FDA under an Emergency Use Authorization (EUA). This EUA will remain in effect (meaning this test can be used) for the duration of the COVID-19 declaration under Section 564(b)(1) of the Act, 21 U.S.C. section 360bbb-3(b)(1), unless the authorization is terminated or revoked.     Resp Syncytial Virus by PCR NEGATIVE NEGATIVE Final    Comment: (NOTE) Fact Sheet for Patients: EntrepreneurPulse.com.au  Fact Sheet for Healthcare Providers: IncredibleEmployment.be  This test is not yet approved or cleared by the [place] FDA and has been authorized for detection and/or diagnosis of SARS-CoV-2 by FDA under an Emergency Use Authorization (EUA). This EUA will remain in effect (meaning this test can be used) for the duration of the COVID-19 declaration under Section 564(b)(1) of the Act, 21 U.S.C. section 360bbb-3(b)(1),  unless the authorization is terminated or revoked.  Performed at [facility], [place] [address] [place], [place] [address]   Blood culture (routine x 2)     Status: None (Preliminary result)   Collection Time: 07/30/22  2:15 PM   Specimen: BLOOD LEFT ARM  Result Value Ref Range Status   Specimen Description   Final    BLOOD LEFT ARM Performed at [facility], [place] [address] [place], [place] [address]    Special Requests   Final    BOTTLES DRAWN AEROBIC ONLY  Blood Culture adequate volume Performed at [facility] [address], [place], [place] [address]    Culture   Final    NO GROWTH 3 DAYS Performed at [facility], [place] [address] [place], [place] [address]    Report Status PENDING  Incomplete  MRSA Next Gen by PCR, Nasal     Status: Abnormal   Collection Time: 07/30/22 11:55 PM   Specimen: Nasal Mucosa; Nasal Swab  Result Value Ref Range Status   MRSA by PCR Next Gen DETECTED (A) NOT DETECTED Final    Comment: (NOTE) The GeneXpert MRSA Assay (FDA approved for NASAL specimens only), is one component of a comprehensive MRSA colonization surveillance program. It is not intended to diagnose MRSA infection nor to guide or monitor treatment for MRSA infections. Test performance is not FDA approved in patients less than 79 years old. Performed at [facility], [place] [address], [place], [place] [address]   Urine Culture (for pregnant, neutropenic or urologic patients or patients with an indwelling urinary catheter)     Status: Abnormal   Collection Time: 07/31/22  3:18 PM   Specimen: Urine, Catheterized  Result Value Ref Range Status   Specimen Description   Final    URINE, CATHETERIZED Performed at [facility] [address] [place], [place] [address]    Special Requests   Final    NONE Performed at [facility], [place] [address] [place], [place] [address]     Culture (A)  Final    >=100,000 COLONIES/mL METHICILLIN RESISTANT STAPHYLOCOCCUS AUREUS   Report Status 08/02/2022 FINAL  Final   Organism ID, Bacteria METHICILLIN RESISTANT STAPHYLOCOCCUS AUREUS (A)  Final      Susceptibility   Methicillin resistant staphylococcus aureus - MIC*    CIPROFLOXACIN >=8 RESISTANT Resistant     GENTAMICIN <=0.5 SENSITIVE Sensitive     NITROFURANTOIN <=16 SENSITIVE Sensitive     OXACILLIN >=4 RESISTANT Resistant     TETRACYCLINE <=1 SENSITIVE Sensitive     VANCOMYCIN 1 SENSITIVE Sensitive     TRIMETH/SULFA <=10 SENSITIVE Sensitive     CLINDAMYCIN >=8 RESISTANT Resistant     RIFAMPIN <=0.5 SENSITIVE Sensitive     Inducible Clindamycin NEGATIVE Sensitive     * >=100,000 COLONIES/mL METHICILLIN RESISTANT STAPHYLOCOCCUS AUREUS         Radiology Studies: No results found.      Scheduled Meds:  Chlorhexidine Gluconate Cloth  6 each Topical Daily   doxazosin  1 mg Oral Daily   heparin  5,000 Units Subcutaneous Q8H   linezolid  600 mg Oral Q12H   mupirocin ointment  1 Application Nasal BID   OLANZapine zydis  5 mg Oral Daily   Continuous Infusions:     LOS: 3 days    Time spent: 97 min  Georgette Shell, MD  08/02/2022, 2:26 PM

## 2022-08-02 NOTE — Plan of Care (Signed)

## 2022-08-03 ENCOUNTER — Encounter (HOSPITAL_COMMUNITY): Payer: Self-pay | Admitting: Internal Medicine

## 2022-08-03 DIAGNOSIS — F03B11 Unspecified dementia, moderate, with agitation: Secondary | ICD-10-CM

## 2022-08-03 LAB — BASIC METABOLIC PANEL
Anion gap: 13 (ref 5–15)
BUN: 29 mg/dL — ABNORMAL HIGH (ref 8–23)
CO2: 27 mmol/L (ref 22–32)
Calcium: 8.4 mg/dL — ABNORMAL LOW (ref 8.9–10.3)
Chloride: 103 mmol/L (ref 98–111)
Creatinine, Ser: 2.08 mg/dL — ABNORMAL HIGH (ref 0.61–1.24)
GFR, Estimated: 33 mL/min — ABNORMAL LOW (ref >60)
Glucose, Bld: 100 mg/dL — ABNORMAL HIGH (ref 70–99)
Potassium: 2.9 mmol/L — ABNORMAL LOW (ref 3.5–5.1)
Sodium: 143 mmol/L (ref 135–145)

## 2022-08-03 LAB — CBC
HCT: 27.5 % — ABNORMAL LOW (ref 39.0–52.0)
Hemoglobin: 8.5 g/dL — ABNORMAL LOW (ref 13.0–17.0)
MCH: 26.2 pg (ref 26.0–34.0)
MCHC: 30.9 g/dL (ref 30.0–36.0)
MCV: 84.6 fL (ref 80.0–100.0)
Platelets: 419 10*3/uL — ABNORMAL HIGH (ref 150–400)
RBC: 3.25 MIL/uL — ABNORMAL LOW (ref 4.22–5.81)
RDW: 15.9 % — ABNORMAL HIGH (ref 11.5–15.5)
WBC: 9.3 10*3/uL (ref 4.0–10.5)
nRBC: 0 % (ref 0.0–0.2)

## 2022-08-03 MED ORDER — MUPIROCIN 2 % EX OINT
TOPICAL_OINTMENT | Freq: Two times a day (BID) | CUTANEOUS | Status: DC
  Administered 2022-08-04: 1 via TOPICAL
  Filled 2022-08-03: qty 22

## 2022-08-03 MED ORDER — GABAPENTIN 100 MG PO CAPS: 200.0000 mg | ORAL_CAPSULE | ORAL | Status: DC

## 2022-08-03 MED ORDER — GABAPENTIN 300 MG PO CAPS
300.0000 mg | ORAL_CAPSULE | ORAL | Status: DC
  Administered 2022-08-03 – 2022-08-04 (×2): 300 mg via ORAL
  Filled 2022-08-03 (×2): qty 1

## 2022-08-03 MED ORDER — GABAPENTIN 100 MG PO CAPS
200.0000 mg | ORAL_CAPSULE | Freq: Every day | ORAL | Status: DC
  Administered 2022-08-03 – 2022-08-05 (×3): 200 mg via ORAL
  Filled 2022-08-03 (×3): qty 2

## 2022-08-03 MED ORDER — POTASSIUM CHLORIDE 10 MEQ/100ML IV SOLN
10.0000 meq | INTRAVENOUS | Status: AC
  Administered 2022-08-03 (×4): 10 meq via INTRAVENOUS
  Filled 2022-08-03 (×3): qty 100

## 2022-08-03 MED ORDER — GABAPENTIN 300 MG PO CAPS
300.0000 mg | ORAL_CAPSULE | Freq: Every day | ORAL | Status: DC
  Administered 2022-08-03 – 2022-08-04 (×2): 300 mg via ORAL
  Filled 2022-08-03 (×2): qty 1

## 2022-08-03 MED ORDER — OLANZAPINE 5 MG PO TBDP
5.0000 mg | ORAL_TABLET | Freq: Every day | ORAL | Status: DC
  Administered 2022-08-03: 5 mg via ORAL
  Filled 2022-08-03: qty 1

## 2022-08-03 MED ORDER — MELATONIN 3 MG PO TABS
6.0000 mg | ORAL_TABLET | Freq: Every evening | ORAL | Status: DC | PRN
  Administered 2022-08-03 – 2022-08-04 (×2): 6 mg via ORAL
  Filled 2022-08-03 (×3): qty 2

## 2022-08-03 MED ORDER — POTASSIUM CHLORIDE CRYS ER 20 MEQ PO TBCR
40.0000 meq | EXTENDED_RELEASE_TABLET | Freq: Three times a day (TID) | ORAL | Status: AC
  Administered 2022-08-03 (×3): 40 meq via ORAL
  Filled 2022-08-03 (×3): qty 2

## 2022-08-03 MED ORDER — MEDIHONEY WOUND/BURN DRESSING EX PSTE
1.0000 | PASTE | Freq: Every day | CUTANEOUS | Status: DC
  Administered 2022-08-03 – 2022-08-05 (×3): 1 via TOPICAL
  Filled 2022-08-03: qty 44

## 2022-08-03 MED ORDER — MIRTAZAPINE 15 MG PO TABS
7.5000 mg | ORAL_TABLET | Freq: Every day | ORAL | Status: DC
  Administered 2022-08-03 – 2022-08-04 (×2): 7.5 mg via ORAL
  Filled 2022-08-03 (×2): qty 1

## 2022-08-03 MED ORDER — OLANZAPINE 10 MG IM SOLR
2.5000 mg | Freq: Once | INTRAMUSCULAR | Status: AC
  Administered 2022-08-03: 2.5 mg via INTRAMUSCULAR
  Filled 2022-08-03: qty 10

## 2022-08-03 MED ORDER — POLYETHYLENE GLYCOL 3350 17 G PO PACK
17.0000 g | PACK | Freq: Every day | ORAL | Status: DC
  Administered 2022-08-03: 17 g via ORAL
  Filled 2022-08-03 (×2): qty 1

## 2022-08-03 MED ORDER — POTASSIUM CHLORIDE CRYS ER 20 MEQ PO TBCR: 40.0000 meq | EXTENDED_RELEASE_TABLET | Freq: Once | ORAL | Status: DC

## 2022-08-03 MED ORDER — ASPIRIN 81 MG PO CHEW
81.0000 mg | CHEWABLE_TABLET | Freq: Every day | ORAL | Status: DC
  Administered 2022-08-03 – 2022-08-05 (×3): 81 mg via ORAL
  Filled 2022-08-03 (×3): qty 1

## 2022-08-03 NOTE — Consult Note (Signed)
WOC Nurse Consult Note: Reason for Consult:Two chronic, nonhealing full thickness wounds to left LE, Unstageable pressure injury to ankle and full thickness to anterior foot. Seen by this writer on 07/21/22 for these wounds. Wound type:Pressure and venous insufficiency Pressure Injury POA: Yes Measurement:To be obtained by Bedside RN with application of next dressing today Wound bed: Nonviable tissue obscured wound bed to ankle wound (20% yellow, 80% black), nonviable tissue in anterior foot wound (70% red, 30% yellow).  Drainage (amount, consistency, odor) small yellow Periwound: dry, intact Dressing procedure/placement/frequency:I have provided guidance for nursing to apply [place] once daily after cleansing. Heels are to be floated and a sacral foam dressing placed for sacral PI prevention.  [place] nursing team will not follow, but will remain available to this patient, the nursing and medical teams.  Please re-consult if needed.  Thank you for inviting [place] to participate in this patient's Plan of Care.  Maudie Flakes, MSN, RN, CNS, [place], Serita Grammes, [employer], [employer] phone:  [phone]

## 2022-08-03 NOTE — Plan of Care (Signed)
Received patient on 2L nasal cannula, alert to self, with left sided weakness and contracture. Patient calm and cooperative at the start of the shift, verbalized he wanted to leave and go home, but started to be a little restless with constant hollering asking to help him leave the hospital around midnight. Offered prn melatonin but patient refused. Patient became more restless and combative, touching foley cath tubing, resisting care and was able to put bed on chair position with one leg dangling in bed. Abigail Butts, NP notified. IM zyprexa given.    Problem: Education: Goal: Knowledge of General Education information will improve Description: Including pain rating scale, medication(s)/side effects and non-pharmacologic comfort measures Outcome: Progressing   Problem: Health Behavior/Discharge Planning: Goal: Ability to manage health-related needs will improve Outcome: Progressing   Problem: Clinical Measurements: Goal: Ability to maintain clinical measurements within normal limits will improve Outcome: Progressing Goal: Will remain free from infection Outcome: Progressing Goal: Diagnostic test results will improve Outcome: Progressing Goal: Respiratory complications will improve Outcome: Progressing Goal: Cardiovascular complication will be avoided Outcome: Progressing   Problem: Activity: Goal: Risk for activity intolerance will decrease Outcome: Progressing   Problem: Nutrition: Goal: Adequate nutrition will be maintained Outcome: Progressing   Problem: Coping: Goal: Level of anxiety will decrease Outcome: Progressing   Problem: Elimination: Goal: Will not experience complications related to bowel motility Outcome: Progressing Goal: Will not experience complications related to urinary retention Outcome: Progressing   Problem: Pain Managment: Goal: General experience of comfort will improve Outcome: Progressing   Problem: Safety: Goal: Ability to remain free from injury  will improve Outcome: Progressing   Problem: Skin Integrity: Goal: Risk for impaired skin integrity will decrease Outcome: Progressing

## 2022-08-03 NOTE — Progress Notes (Addendum)
PROGRESS NOTE    Jason Jones  [id number] DOB: Oct 18, 1950 DOA: 07/30/2022 PCP: Arthur Holms, NP    Brief Narrative:  Jason Jones is a 72 y.o. male with medical history significant of prior stroke with residual left upper and lower extremity weakness, dementia, prostate cancer, chronic indwelling Foley catheter.  Patient had stroke in November 2023 since then he has been living at [facility] as a long-term care resident.  Discussed with daughter, per daughter patient's baseline is he talks he eats he is bedbound.  For the last few days patient has not been eating or drinking well and has not been talking his baseline.  He is on a dysphagia 3 diet.  Nursing home sent him out to the hospital for concerns of possible new stroke versus sepsis.  Patient also had reportedly had fever at the nursing home.  History obtained from the ER records and the patient's daughter.  Patient not able to provide any history.  No chest pain shortness of breath or cough noted.  No nausea vomiting diarrhea noted. Urine in the Foley cloudy appearing. Patient was discharged from the hospital 07/24/2022 when he was admitted with.  Pneumonia fever of 101.6 with leukocytosis at that time.  He was treated with Levaquin.  PCCM was consulted at that time for pleural effusion who recommended to discharge to nursing home with Levaquin for total of 7 days which should have finished today.  Patient had swelling of his lips from ceftriaxone in his last hospital admission. His past medical history significant for asthma, GERD, hypertension, history of stroke, hepatitis C, vascular dementia, AKI.   On admission in the ER his labs were significant for man of 59 creatinine 4.70 sodium 140 potassium 3.8 albumin is 2.4 GFR 12 WBC 15.5, at the time of discharge his WBC count was 12.2 on 07/23/2022 CT head revealed no acute findings His Foley catheter was changed on 07/21/2022 Chest x-ray no active disease UA shows large amount of  leukocytes with proteins bacteria and more than 50 WBC. ED Course: He received IV fluids, vancomycin, Flagyl, aztreonam.  Assessment & Plan:   Active Problems:   Normocytic anemia   Prostate cancer ([place])   Elevated liver enzymes   Rhabdomyolysis   Acute kidney injury ([place])   Malnutrition of moderate degree   BPH with urinary obstruction   Leukocytosis   Urinary retention   Hydronephrosis, bilateral   Urinary tract infection associated with indwelling urethral catheter ([place])   #1 acute metabolic encephalopathy likely secondary to multifactorial causes.Patient admitted with fever not eating or drinking.  On admission found to be in AKI and possible urinary tract infection. Creatinine 2.08 from 3.06 from  4.15 from 4.92 after placing Foley by urology. Dc IV fluids. Sepsis bundle was initiated in the ER.  His blood pressure on admission was 100/60 pulse was 90 respiration 12 saturation 95% on 2 L Normal lactic acid Leukocytosis  resolved 15.5 >17.8>14.3 Patient received vancomycin and aztreonam and Flagyl in the ER.  He is allergic to Rocephin and doxycycline.   #2 fever likely source urine.  Patient has BPH chronic Foley catheter and prostate cancer.  He was discharged from the hospital on 07/24/2022 on Levaquin for 7 days for pneumonia the last date would have been 07/30/2022. Prior urine culture with MRSA and MRSA screen positive.  He is allergic to doxycycline.  Not able to give vancomycin due to AKI. Zyvox twice daily 2/21 Urine culture MRSA   #3 AKI-improving  multifactorial secondary to  acute urinary retention secondary to BPH  prostate cancer and from prerenal with decreased p.o. intake. Renal functions improving after placement of Foley by urology.  Appreciate urology assistance. Renal ultra sound showed-Moderate bilateral hydronephrosis with marked urinary bladder distension. Findings are most suggestive of bladder outlet obstruction.Marked increased cortical echogenicity of  the bilateral kidneys compatible with medical renal disease.Debris in the urinary bladder may be infectious or hemorrhagic. There is also a masslike appearance at the bladder base. Urologic consultation is suggested in this patient with history of prostate neoplasm. Cystoscopic follow-up may be warranted given the appearance of the bladder base to exclude concomitant urothelial neoplasm or bladder base invasion from prostate cancer.   #4 dementia with behaviors had to get an extra dose of Zyprexa last night will change  to 5 mg nightly     #5 hypertension restarted home medications 135/69  #6 chronic anemia patient received 2 units of blood transfusion earlier this month.  Patient was seen by GI during that admission for FOBT positive no colonoscopy was planned.  Will monitor H&H.   #7 CODE STATUS discussed with patient's daughter-DNR  #8  hypokalemia potassium 2.9 repleted check mag level  #9 chronic non healing full thickness wounds LLE-wound care recommending Medihoney    Pressure Injury 07/20/22 Ankle Anterior;Left Unstageable - Full thickness tissue loss in which the base of the injury is covered by slough (yellow, tan, gray, green or brown) and/or eschar (tan, brown or black) in the wound bed. (Active)  07/20/22 1809  Location: Ankle  Location Orientation: Anterior;Left  Staging: Unstageable - Full thickness tissue loss in which the base of the injury is covered by slough (yellow, tan, gray, green or brown) and/or eschar (tan, brown or black) in the wound bed.  Wound Description (Comments):   Present on Admission: Yes  Dressing Type Foam - Lift dressing to assess site every shift 08/02/22 2030     Pressure Injury 07/20/22 Foot Anterior;Left Stage 3 -  Full thickness tissue loss. Subcutaneous fat may be visible but bone, tendon or muscle are NOT exposed. (Active)  07/20/22 1810  Location: Foot  Location Orientation: Anterior;Left  Staging: Stage 3 -  Full thickness tissue loss.  Subcutaneous fat may be visible but bone, tendon or muscle are NOT exposed.  Wound Description (Comments):   Present on Admission: Yes  Dressing Type Foam - Lift dressing to assess site every shift 08/02/22 2030    Estimated body mass index is 18.56 kg/m as calculated from the following:   Height as of 07/20/22: '5\' 11"'$  (1.803 m).   Weight as of 07/20/22: 60.4 kg.  DVT prophylaxis: Heparin Code Status: DNR Family Communication: Discussed with daughter  disposition Plan:  Status is: Inpatient Remains inpatient appropriate because: Acute kidney injury  Consultants:  Nephrology  Procedures: Foley replaced 07/31/2022 Antimicrobials: zyvox Subjective: He had a rough night received an extra dose of Zyprexa Foley catheter in place draining clear urine Objective: Vitals:   08/01/22 1953 08/02/22 0546 08/02/22 1409 08/02/22 1940  BP: 131/74 (!) 140/70 124/76 135/69  Pulse: 88 82 84 91  Resp:  '18 18 17  '$ Temp: 97.9 F (36.6 C)  98 F (36.7 C) 98 F (36.7 C)  TempSrc:   Oral   SpO2: 99% 95% 98% 100%    Intake/Output Summary (Last 24 hours) at 08/03/2022 0918 Last data filed at 08/03/2022 0100 Gross per 24 hour  Intake 598 ml  Output 2950 ml  Net -2352 ml    There were no  vitals filed for this visit.  Examination: Foley in place urine appears clear than yesterday Negative by 2352 General exam: Appears chronically ill-appearing Respiratory system: Clear to auscultation. Respiratory effort normal. Cardiovascular system: S1 & S2 heard, RRR. No JVD, murmurs, rubs, gallops or clicks. No pedal edema. Gastrointestinal system: Abdomen is nondistended, soft and nontender. No organomegaly or masses felt. Normal bowel sounds heard. Central nervous system: Left hemiparesis Confused Extremities: Edema to left lower extremity Nonhealing full-thickness wounds to the left lower extremity unstageable pressure injury to the ankle and full-thickness to the anterior foot Psychiatry: Unable to  assess    Data Reviewed: I have personally reviewed following labs and imaging studies  CBC: Recent Labs  Lab 07/30/22 1007 07/30/22 1415 07/31/22 0349 08/01/22 0402 08/02/22 0340 08/03/22 0742  WBC 15.5* 18.5* 17.8* 14.3* 10.4 9.3  NEUTROABS 13.3*  --   --   --   --   --   HGB 9.7* 9.6* 9.4* 9.1* 8.4* 8.5*  HCT 32.1* 31.3* 30.0* 29.5* 26.9* 27.5*  MCV 86.8 88.4 85.7 86.3 84.9 84.6  PLT 463* 371 416* 392 351 419*    Basic Metabolic Panel: Recent Labs  Lab 07/31/22 0349 07/31/22 1655 08/01/22 0402 08/02/22 0340 08/03/22 0742  NA 141 143 141 144 143  K 4.1 4.1 3.9 3.2* 2.9*  CL 107 110 108 108 103  CO2 21* 19* 20* 24 27  GLUCOSE 100* 92 85 97 100*  BUN 56* 58* 52* 44* 29*  CREATININE 4.92* 4.79* 4.15* 3.06* 2.08*  CALCIUM 8.4* 8.1* 8.5* 8.2* 8.4*    GFR: Estimated Creatinine Clearance: 27.4 mL/min (A) (by C-G formula based on SCr of 2.08 mg/dL (H)). Liver Function Tests: Recent Labs  Lab 07/30/22 1007 07/31/22 0349  AST 19 24  ALT 21 20  ALKPHOS 49 43  BILITOT 0.4 0.6  PROT 8.3* 7.2  ALBUMIN 2.4* 2.3*    No results for input(s): "LIPASE", "AMYLASE" in the last 168 hours. Recent Labs  Lab 07/30/22 1011  AMMONIA 20    Coagulation Profile: Recent Labs  Lab 07/30/22 1007  INR 1.2    Cardiac Enzymes: No results for input(s): "CKTOTAL", "CKMB", "CKMBINDEX", "TROPONINI" in the last 168 hours. BNP (last 3 results) No results for input(s): "PROBNP" in the last 8760 hours. HbA1C: No results for input(s): "HGBA1C" in the last 72 hours. CBG: No results for input(s): "GLUCAP" in the last 168 hours. Lipid Profile: No results for input(s): "CHOL", "HDL", "LDLCALC", "TRIG", "CHOLHDL", "LDLDIRECT" in the last 72 hours. Thyroid Function Tests: No results for input(s): "TSH", "T4TOTAL", "FREET4", "T3FREE", "THYROIDAB" in the last 72 hours. Anemia Panel: No results for input(s): "VITAMINB12", "FOLATE", "FERRITIN", "TIBC", "IRON", "RETICCTPCT" in the last  72 hours. Sepsis Labs: Recent Labs  Lab 07/30/22 1008 07/30/22 1415 07/31/22 0349  PROCALCITON  --   --  0.44  LATICACIDVEN 1.1 1.1  --      Recent Results (from the past 240 hour(s))  Blood culture (routine x 2)     Status: None (Preliminary result)   Collection Time: 07/30/22 10:11 AM   Specimen: BLOOD  Result Value Ref Range Status   Specimen Description   Final    BLOOD BLOOD LEFT FOREARM Performed at [facility] [address], [place], [place] [address]    Special Requests   Final    BOTTLES DRAWN AEROBIC AND ANAEROBIC Blood Culture adequate volume Performed at [facility] [address] [place], [place] [address]    Culture  Final    NO GROWTH 4 DAYS Performed at [facility], [place] [address], [place], [place] [address]    Report Status PENDING  Incomplete  Resp panel by RT-PCR (RSV, Flu A&B, Covid) Anterior Nasal Swab     Status: None   Collection Time: 07/30/22 10:34 AM   Specimen: Anterior Nasal Swab  Result Value Ref Range Status   SARS Coronavirus 2 by RT PCR NEGATIVE NEGATIVE Final    Comment: (NOTE) SARS-CoV-2 target nucleic acids are NOT DETECTED.  The SARS-CoV-2 RNA is generally detectable in upper respiratory specimens during the acute phase of infection. The lowest concentration of SARS-CoV-2 viral copies this assay can detect is 138 copies/mL. A negative result does not preclude SARS-Cov-2 infection and should not be used as the sole basis for treatment or other patient management decisions. A negative result may occur with  improper specimen collection/handling, submission of specimen other than nasopharyngeal swab, presence of viral mutation(s) within the areas targeted by this assay, and inadequate number of viral copies(<138 copies/mL). A negative result must be combined with clinical observations, patient history, and epidemiological information. The expected result is Negative.  Fact Sheet for  Patients:  EntrepreneurPulse.com.au  Fact Sheet for Healthcare Providers:  IncredibleEmployment.be  This test is no t yet approved or cleared by the [place] FDA and  has been authorized for detection and/or diagnosis of SARS-CoV-2 by FDA under an Emergency Use Authorization (EUA). This EUA will remain  in effect (meaning this test can be used) for the duration of the COVID-19 declaration under Section 564(b)(1) of the Act, 21 U.S.C.section 360bbb-3(b)(1), unless the authorization is terminated  or revoked sooner.       Influenza A by PCR NEGATIVE NEGATIVE Final   Influenza B by PCR NEGATIVE NEGATIVE Final    Comment: (NOTE) The Xpert Xpress SARS-CoV-2/FLU/RSV plus assay is intended as an aid in the diagnosis of influenza from Nasopharyngeal swab specimens and should not be used as a sole basis for treatment. Nasal washings and aspirates are unacceptable for Xpert Xpress SARS-CoV-2/FLU/RSV testing.  Fact Sheet for Patients: EntrepreneurPulse.com.au  Fact Sheet for Healthcare Providers: IncredibleEmployment.be  This test is not yet approved or cleared by the [place] FDA and has been authorized for detection and/or diagnosis of SARS-CoV-2 by FDA under an Emergency Use Authorization (EUA). This EUA will remain in effect (meaning this test can be used) for the duration of the COVID-19 declaration under Section 564(b)(1) of the Act, 21 U.S.C. section 360bbb-3(b)(1), unless the authorization is terminated or revoked.     Resp Syncytial Virus by PCR NEGATIVE NEGATIVE Final    Comment: (NOTE) Fact Sheet for Patients: EntrepreneurPulse.com.au  Fact Sheet for Healthcare Providers: IncredibleEmployment.be  This test is not yet approved or cleared by the [place] FDA and has been authorized for detection and/or diagnosis of SARS-CoV-2 by FDA under an Emergency  Use Authorization (EUA). This EUA will remain in effect (meaning this test can be used) for the duration of the COVID-19 declaration under Section 564(b)(1) of the Act, 21 U.S.C. section 360bbb-3(b)(1), unless the authorization is terminated or revoked.  Performed at [facility], [place] [address], [place], [place] [address]   Blood culture (routine x 2)     Status: None (Preliminary result)   Collection Time: 07/30/22  2:15 PM   Specimen: BLOOD LEFT ARM  Result Value Ref Range Status   Specimen Description   Final    BLOOD LEFT ARM Performed at [facility], [place] [address], [place], [place] [address]    Special Requests   Final    BOTTLES DRAWN AEROBIC ONLY Blood Culture adequate volume Performed at [facility] [address], [place], [place] [address]    Culture   Final    NO GROWTH 4 DAYS Performed at [facility], [place] [address], [place], [place] [address]    Report Status PENDING  Incomplete  MRSA Next Gen by PCR, Nasal     Status: Abnormal   Collection Time: 07/30/22 11:55 PM   Specimen: Nasal Mucosa; Nasal Swab  Result Value Ref Range Status   MRSA by PCR Next Gen DETECTED (A) NOT DETECTED Final    Comment: (NOTE) The GeneXpert MRSA Assay (FDA approved for NASAL specimens only), is one component of a comprehensive MRSA colonization surveillance program. It is not intended to diagnose MRSA infection nor to guide or monitor treatment for MRSA infections. Test performance is not FDA approved in patients less than 18 years old. Performed at [facility], [place] [address], [place], [place] [address]   Urine Culture (for pregnant, neutropenic or urologic patients or patients with an indwelling urinary catheter)     Status: Abnormal   Collection Time: 07/31/22  3:18 PM   Specimen: Urine, Catheterized  Result Value Ref Range Status   Specimen Description   Final    URINE, CATHETERIZED Performed at [facility] [address] [place], [place] [address]    Special Requests   Final    NONE Performed at [facility], [place] [address], [place], [place] [address]    Culture (A)  Final    >=100,000 COLONIES/mL METHICILLIN RESISTANT STAPHYLOCOCCUS AUREUS   Report Status 08/02/2022 FINAL  Final   Organism ID, Bacteria METHICILLIN RESISTANT STAPHYLOCOCCUS AUREUS (A)  Final      Susceptibility   Methicillin resistant staphylococcus aureus - MIC*    CIPROFLOXACIN >=8 RESISTANT Resistant     GENTAMICIN <=0.5 SENSITIVE Sensitive     NITROFURANTOIN <=16 SENSITIVE Sensitive     OXACILLIN >=4 RESISTANT Resistant     TETRACYCLINE <=1 SENSITIVE Sensitive     VANCOMYCIN 1 SENSITIVE Sensitive     TRIMETH/SULFA <=10 SENSITIVE Sensitive     CLINDAMYCIN >=8 RESISTANT Resistant     RIFAMPIN <=0.5 SENSITIVE Sensitive     Inducible Clindamycin NEGATIVE Sensitive     * >=100,000 COLONIES/mL METHICILLIN RESISTANT STAPHYLOCOCCUS AUREUS         Radiology Studies: No results found.      Scheduled Meds:  Chlorhexidine Gluconate Cloth  6 each Topical Daily   doxazosin  1 mg Oral Daily   heparin  5,000 Units Subcutaneous Q8H   leptospermum manuka honey  1 Application Topical Daily   linezolid  600 mg Oral Q12H   mupirocin ointment  1 Application Nasal BID   mupirocin ointment   Topical BID   OLANZapine zydis  5 mg Oral Daily   Continuous Infusions:     LOS: 4 days    Time spent: 72 min  Georgette Shell, MD  08/03/2022, 9:18 AM

## 2022-08-03 NOTE — Progress Notes (Signed)
Daily Progress Note   Patient Name: Jason Jones       Date: 08/03/2022 DOB: 1950-12-09  Age: 72 y.o. MRN#: [id number] Attending Physician: Georgette Shell, MD Primary Care Physician: Arthur Holms, NP Admit Date: 07/30/2022  Reason for Consultation/Follow-up: Establishing goals of care  Patient Profile/HPI:  72 y.o. 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 AKI with Cr 4.7 on admission, 4.92 today. He had a recent hospitalization 2/10-2/14 for pneumonia and GI bleeding. Palliative medicine consulted for goals of care.     Subjective: Chart reviewed. Mental status improved. He is awake and alert, confused. Asking to has his IV removed and asking for his tennis shoes. Somewhat redirectable. Empty lunch tray at bedside. No family present. PO intake is improving. Cr improved today.   Review of Systems  Unable to perform ROS: Mental status change     Physical Exam Vitals and nursing note reviewed.  Constitutional:      General: He is not in acute distress. Cardiovascular:     Rate and Rhythm: Normal rate.  Pulmonary:     Effort: Pulmonary effort is normal.  Neurological:     Mental Status: He is alert. He is disoriented.             Vital Signs: BP (!) 153/81 (BP Location: Right Arm)   Pulse 81   Temp 98.4 F (36.9 C) (Oral)   Resp 16   Ht '5\' 11"'$  (1.803 m)   Wt 61 kg   SpO2 99%   BMI 18.76 kg/m  SpO2: SpO2: 99 % O2 Device: O2 Device: Nasal Cannula O2 Flow Rate: O2 Flow Rate (L/min): 0 L/min  Intake/output summary:  Intake/Output Summary (Last 24 hours) at  08/03/2022 1312 Last data filed at 08/03/2022 0954 Gross per 24 hour  Intake 596 ml  Output 3950 ml  Net -3354 ml   LBM: Last BM Date : 08/02/22 Baseline Weight: Weight: 61 kg Most recent weight: Weight: 61 kg       Palliative Assessment/Data: PPS: 30%      Patient Active Problem List   Diagnosis Date Noted   Urinary retention 07/31/2022  Hydronephrosis, bilateral 07/31/2022   Urinary tract infection associated with indwelling urethral catheter ([place]) 07/31/2022   Leukocytosis 07/30/2022   CAP (community acquired pneumonia) 07/20/2022   Pressure injury of skin 07/20/2022   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   Chronic hepatitis C ([place]) 07/20/2022   Protein-calorie malnutrition, severe ([place]) 07/20/2022   Rectal bleeding 07/20/2022   Normocytic anemia 06/28/2022   Hematuria 05/01/2022   History of CVA (cerebrovascular accident) 04/17/2022   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   Malnutrition of moderate degree 04/15/2022   Acute kidney injury ([place]) 04/12/2022   Acute ischemic right PCA stroke ([place]) 03/25/2022   Primary hypertension 03/25/2022   Dysphagia due to recent cerebrovascular accident (CVA) 03/25/2022   Elevated liver enzymes 03/25/2022   Rhabdomyolysis 03/25/2022   Prostate cancer ([place]) 02/20/2021    Palliative Care Assessment & Plan    Assessment/Recommendations/Plan  Continue current care Cr and his overall status is improving, GOC continue to be full scope with limit set at DNR, hope to return to prior place of living PMT will follow intermittently   Code Status: DNR  Prognosis:  Unable to determine  Discharge Planning: To Be Determined   Thank you for allowing the Palliative Medicine Team to assist in the care of this patient.  Total time: 45 mins Prolonged billing:   Greater than 50%  of this time was spent counseling and coordinating care related to the above assessment and plan.  Mariana Kaufman, AGNP-C Palliative  Medicine   Please contact Palliative Medicine Team phone at [phone] for questions and concerns.

## 2022-08-03 NOTE — Plan of Care (Signed)

## 2022-08-03 NOTE — Progress Notes (Signed)
       CROSS COVER NOTE  NAME: Jason Jones MRN: [id number] DOB : [birth year]    Date of Service   08/03/2022     HPI/Events of Note   0130-  Notified by nursing that patient is more restless and agitated.  He is also attempting to get out of bed without assistance.  High fall risk.  Sitter not available at this time.  Patient does have a history of dementia and takes Zyprexa daily.  At this time he is refusing to cooperate with nursing staff and is refusing all oral medicine.  Will order 2.5 mg IM Zyprexa for tonight.     Interventions/ Plan   Zyprexa       Raenette Rover, DNP, [place]

## 2022-08-03 NOTE — Progress Notes (Signed)
Patient refused lab works and morning VS. Becomes combative with patient care.

## 2022-08-04 LAB — CBC
HCT: 27.6 % — ABNORMAL LOW (ref 39.0–52.0)
Hemoglobin: 8.6 g/dL — ABNORMAL LOW (ref 13.0–17.0)
MCH: 26.8 pg (ref 26.0–34.0)
MCHC: 31.2 g/dL (ref 30.0–36.0)
MCV: 86 fL (ref 80.0–100.0)
Platelets: 326 10*3/uL (ref 150–400)
RBC: 3.21 MIL/uL — ABNORMAL LOW (ref 4.22–5.81)
RDW: 15.9 % — ABNORMAL HIGH (ref 11.5–15.5)
WBC: 6.2 10*3/uL (ref 4.0–10.5)
nRBC: 0 % (ref 0.0–0.2)

## 2022-08-04 LAB — CULTURE, BLOOD (ROUTINE X 2)
Culture: NO GROWTH
Culture: NO GROWTH
Special Requests: ADEQUATE
Special Requests: ADEQUATE

## 2022-08-04 LAB — BASIC METABOLIC PANEL
Anion gap: 8 (ref 5–15)
BUN: 24 mg/dL — ABNORMAL HIGH (ref 8–23)
CO2: 27 mmol/L (ref 22–32)
Calcium: 8.3 mg/dL — ABNORMAL LOW (ref 8.9–10.3)
Chloride: 109 mmol/L (ref 98–111)
Creatinine, Ser: 1.74 mg/dL — ABNORMAL HIGH (ref 0.61–1.24)
GFR, Estimated: 41 mL/min — ABNORMAL LOW (ref >60)
Glucose, Bld: 97 mg/dL (ref 70–99)
Potassium: 4.2 mmol/L (ref 3.5–5.1)
Sodium: 144 mmol/L (ref 135–145)

## 2022-08-04 MED ORDER — OLANZAPINE 5 MG PO TBDP
5.0000 mg | ORAL_TABLET | Freq: Two times a day (BID) | ORAL | Status: DC
  Administered 2022-08-04 – 2022-08-05 (×2): 5 mg via ORAL
  Filled 2022-08-04 (×2): qty 1

## 2022-08-04 NOTE — Progress Notes (Signed)
PROGRESS NOTE    Jason Jones  [id number] DOB: [birth year] DOA: 07/30/2022 PCP: Arthur Holms, NP    Brief Narrative:  Jason Jones is a 72 y.o. male with medical history significant of prior stroke with residual left upper and lower extremity weakness, dementia, prostate cancer, chronic indwelling Foley catheter.  Patient had stroke in November 2023 since then he has been living at [facility] as a long-term care resident.  Discussed with daughter, per daughter patient's baseline is he talks he eats he is bedbound.  For the last few days patient has not been eating or drinking well and has not been talking his baseline.  He is on a dysphagia 3 diet.  Nursing home sent him out to the hospital for concerns of possible new stroke versus sepsis.  Patient also had reportedly had fever at the nursing home.  History obtained from the ER records and the patient's daughter.  Patient not able to provide any history.  No chest pain shortness of breath or cough noted.  No nausea vomiting diarrhea noted. Urine in the Foley cloudy appearing. Patient was discharged from the hospital 07/24/2022 when he was admitted with.  Pneumonia fever of 101.6 with leukocytosis at that time.  He was treated with Levaquin.  PCCM was consulted at that time for pleural effusion who recommended to discharge to nursing home with Levaquin for total of 7 days which should have finished today.  Patient had swelling of his lips from ceftriaxone in his last hospital admission. His past medical history significant for asthma, GERD, hypertension, history of stroke, hepatitis C, vascular dementia, AKI.   On admission in the ER his labs were significant for man of 59 creatinine 4.70 sodium 140 potassium 3.8 albumin is 2.4 GFR 12 WBC 15.5, at the time of discharge his WBC count was 12.2 on 07/23/2022 CT head revealed no acute findings His Foley catheter was changed on 07/21/2022 Chest x-ray no active disease UA shows large amount of  leukocytes with proteins bacteria and more than 50 WBC. ED Course: He received IV fluids, vancomycin, Flagyl, aztreonam.  Assessment & Plan:   Active Problems:   Normocytic anemia   Prostate cancer ([place])   Elevated liver enzymes   Rhabdomyolysis   Acute kidney injury ([place])   Malnutrition of moderate degree   BPH with urinary obstruction   Leukocytosis   Urinary retention   Hydronephrosis, bilateral   Urinary tract infection associated with indwelling urethral catheter ([place])   #1 acute metabolic encephalopathy likely secondary to multifactorial causes.Patient admitted with fever not eating or drinking.  On admission found to be in AKI and possible urinary tract infection. Creatinine 1.7 from  2.08 from 3.06 from  4.15 from 4.92 after placing Foley by urology. Dc IV fluids. Sepsis bundle was initiated in the ER.  His blood pressure on admission was 100/60 pulse was 90 respiration 12 saturation 95% on 2 L Normal lactic acid Leukocytosis  resolved  Patient received vancomycin and aztreonam and Flagyl in the ER.  He is allergic to Rocephin and doxycycline.   #2 fever likely source urine.  Patient has BPH chronic Foley catheter and prostate cancer.  He was discharged from the hospital on 07/24/2022 on Levaquin for 7 days for pneumonia the last date would have been 07/30/2022. urine culture with MRSA and MRSA screen positive.  He is allergic to doxycycline.  Not able to give vancomycin due to AKI. Zyvox twice daily 2/21 Urine culture MRSA   #3 AKI-improving  multifactorial secondary  to acute urinary retention secondary to BPH  prostate cancer and from prerenal with decreased p.o. intake. Renal functions improving after placement of Foley by urology.  Appreciate urology assistance. Renal ultra sound showed-Moderate bilateral hydronephrosis with marked urinary bladder distension. Findings are most suggestive of bladder outlet obstruction.Marked increased cortical echogenicity of the  bilateral kidneys compatible with medical renal disease.Debris in the urinary bladder may be infectious or hemorrhagic. There is also a masslike appearance at the bladder base. Urologic consultation is suggested in this patient with history of prostate neoplasm. Cystoscopic follow-up may be warranted given the appearance of the bladder base to exclude concomitant urothelial neoplasm or bladder base invasion from prostate cancer.   #4 dementia with behaviors had to get an extra dose of Zyprexa last night will change  to 5 mg nightly     #5 hypertension restarted home medications 135/69  #6 chronic anemia patient received 2 units of blood transfusion earlier this month.  Patient was seen by GI during that admission for FOBT positive no colonoscopy was planned.  Will monitor H&H.   #7 CODE STATUS discussed with patient's daughter-DNR  #8  hypokalemia potassium 2.9 repleted check mag level  #9 chronic non healing full thickness wounds LLE-wound care recommending Medihoney    Pressure Injury 07/20/22 Ankle Anterior;Left Unstageable - Full thickness tissue loss in which the base of the injury is covered by slough (yellow, tan, gray, green or brown) and/or eschar (tan, brown or black) in the wound bed. (Active)  07/20/22 1809  Location: Ankle  Location Orientation: Anterior;Left  Staging: Unstageable - Full thickness tissue loss in which the base of the injury is covered by slough (yellow, tan, gray, green or brown) and/or eschar (tan, brown or black) in the wound bed.  Wound Description (Comments):   Present on Admission: Yes  Dressing Type Gauze (Comment) 08/03/22 2019     Pressure Injury 07/20/22 Foot Anterior;Left Stage 3 -  Full thickness tissue loss. Subcutaneous fat may be visible but bone, tendon or muscle are NOT exposed. (Active)  07/20/22 1810  Location: Foot  Location Orientation: Anterior;Left  Staging: Stage 3 -  Full thickness tissue loss. Subcutaneous fat may be visible but  bone, tendon or muscle are NOT exposed.  Wound Description (Comments):   Present on Admission: Yes  Dressing Type Gauze (Comment) 08/03/22 2019    Estimated body mass index is 18.76 kg/m as calculated from the following:   Height as of this encounter: '5\' 11"'$  (1.803 m).   Weight as of this encounter: 61 kg.  DVT prophylaxis: Heparin Code Status: DNR Family Communication: Discussed with daughter  disposition Plan:  Status is: Inpatient Remains inpatient appropriate because: Acute kidney injury  Consultants:  Nephrology  Procedures: Foley replaced 07/31/2022 Antimicrobials: zyvox Subjective: Didn't sleep well  Objective: Vitals:   08/03/22 1310 08/03/22 1928 08/04/22 0556 08/04/22 1226  BP:  (!) 143/84 126/82 127/69  Pulse:  94 76 89  Resp:  '19 18 16  '$ Temp:  97.6 F (36.4 C) (!) 97.5 F (36.4 C) 98.3 F (36.8 C)  TempSrc:   Oral Oral  SpO2:  96% 95% 94%  Weight: 61 kg     Height: '5\' 11"'$  (1.803 m)       Intake/Output Summary (Last 24 hours) at 08/04/2022 1353 Last data filed at 08/04/2022 1235 Gross per 24 hour  Intake 970.46 ml  Output 3525 ml  Net -2554.54 ml    Filed Weights   08/03/22 1310  Weight: 61 kg  Examination:  General exam: Appears chronically ill-appearing Respiratory system: Clear to auscultation. Respiratory effort normal. Cardiovascular system: S1 & S2 heard, RRR. No JVD, murmurs, rubs, gallops or clicks. No pedal edema. Gastrointestinal system: Abdomen is nondistended, soft and nontender. No organomegaly or masses felt. Normal bowel sounds heard. Central nervous system: Left hemiparesis Extremities: Edema to left lower extremity Nonhealing full-thickness wounds to the left lower extremity unstageable pressure injury to the ankle and full-thickness to the anterior foot Psychiatry: Unable to assess    Data Reviewed: I have personally reviewed following labs and imaging studies  CBC: Recent Labs  Lab 07/30/22 1007 07/30/22 1415  07/31/22 0349 08/01/22 0402 08/02/22 0340 08/03/22 0742 08/04/22 0345  WBC 15.5*   < > 17.8* 14.3* 10.4 9.3 6.2  NEUTROABS 13.3*  --   --   --   --   --   --   HGB 9.7*   < > 9.4* 9.1* 8.4* 8.5* 8.6*  HCT 32.1*   < > 30.0* 29.5* 26.9* 27.5* 27.6*  MCV 86.8   < > 85.7 86.3 84.9 84.6 86.0  PLT 463*   < > 416* 392 351 419* 326   < > = values in this interval not displayed.    Basic Metabolic Panel: Recent Labs  Lab 07/31/22 1655 08/01/22 0402 08/02/22 0340 08/03/22 0742 08/04/22 0345  NA 143 141 144 143 144  K 4.1 3.9 3.2* 2.9* 4.2  CL 110 108 108 103 109  CO2 19* 20* '24 27 27  '$ GLUCOSE 92 85 97 100* 97  BUN 58* 52* 44* 29* 24*  CREATININE 4.79* 4.15* 3.06* 2.08* 1.74*  CALCIUM 8.1* 8.5* 8.2* 8.4* 8.3*    GFR: Estimated Creatinine Clearance: 33.1 mL/min (A) (by C-G formula based on SCr of 1.74 mg/dL (H)). Liver Function Tests: Recent Labs  Lab 07/30/22 1007 07/31/22 0349  AST 19 24  ALT 21 20  ALKPHOS 49 43  BILITOT 0.4 0.6  PROT 8.3* 7.2  ALBUMIN 2.4* 2.3*    No results for input(s): "LIPASE", "AMYLASE" in the last 168 hours. Recent Labs  Lab 07/30/22 1011  AMMONIA 20    Coagulation Profile: Recent Labs  Lab 07/30/22 1007  INR 1.2    Cardiac Enzymes: No results for input(s): "CKTOTAL", "CKMB", "CKMBINDEX", "TROPONINI" in the last 168 hours. BNP (last 3 results) No results for input(s): "PROBNP" in the last 8760 hours. HbA1C: No results for input(s): "HGBA1C" in the last 72 hours. CBG: No results for input(s): "GLUCAP" in the last 168 hours. Lipid Profile: No results for input(s): "CHOL", "HDL", "LDLCALC", "TRIG", "CHOLHDL", "LDLDIRECT" in the last 72 hours. Thyroid Function Tests: No results for input(s): "TSH", "T4TOTAL", "FREET4", "T3FREE", "THYROIDAB" in the last 72 hours. Anemia Panel: No results for input(s): "VITAMINB12", "FOLATE", "FERRITIN", "TIBC", "IRON", "RETICCTPCT" in the last 72 hours. Sepsis Labs: Recent Labs  Lab  07/30/22 1008 07/30/22 1415 07/31/22 0349  PROCALCITON  --   --  0.44  LATICACIDVEN 1.1 1.1  --      Recent Results (from the past 240 hour(s))  Blood culture (routine x 2)     Status: None   Collection Time: 07/30/22 10:11 AM   Specimen: BLOOD  Result Value Ref Range Status   Specimen Description   Final    BLOOD BLOOD LEFT FOREARM Performed at [facility] [address], [place], [place] [address]    Special Requests   Final    BOTTLES DRAWN AEROBIC AND ANAEROBIC Blood Culture adequate volume  Performed at [facility], [place] [address], [place], [place] [address]    Culture   Final    NO GROWTH 5 DAYS Performed at [facility], [place] [address], [place], [place] [address]    Report Status 08/04/2022 FINAL  Final  Resp panel by RT-PCR (RSV, Flu A&B, Covid) Anterior Nasal Swab     Status: None   Collection Time: 07/30/22 10:34 AM   Specimen: Anterior Nasal Swab  Result Value Ref Range Status   SARS Coronavirus 2 by RT PCR NEGATIVE NEGATIVE Final    Comment: (NOTE) SARS-CoV-2 target nucleic acids are NOT DETECTED.  The SARS-CoV-2 RNA is generally detectable in upper respiratory specimens during the acute phase of infection. The lowest concentration of SARS-CoV-2 viral copies this assay can detect is 138 copies/mL. A negative result does not preclude SARS-Cov-2 infection and should not be used as the sole basis for treatment or other patient management decisions. A negative result may occur with  improper specimen collection/handling, submission of specimen other than nasopharyngeal swab, presence of viral mutation(s) within the areas targeted by this assay, and inadequate number of viral copies(<138 copies/mL). A negative result must be combined with clinical observations, patient history, and epidemiological information. The expected result is Negative.  Fact Sheet for Patients:   EntrepreneurPulse.com.au  Fact Sheet for Healthcare Providers:  IncredibleEmployment.be  This test is no t yet approved or cleared by the [place] FDA and  has been authorized for detection and/or diagnosis of SARS-CoV-2 by FDA under an Emergency Use Authorization (EUA). This EUA will remain  in effect (meaning this test can be used) for the duration of the COVID-19 declaration under Section 564(b)(1) of the Act, 21 U.S.C.section 360bbb-3(b)(1), unless the authorization is terminated  or revoked sooner.       Influenza A by PCR NEGATIVE NEGATIVE Final   Influenza B by PCR NEGATIVE NEGATIVE Final    Comment: (NOTE) The Xpert Xpress SARS-CoV-2/FLU/RSV plus assay is intended as an aid in the diagnosis of influenza from Nasopharyngeal swab specimens and should not be used as a sole basis for treatment. Nasal washings and aspirates are unacceptable for Xpert Xpress SARS-CoV-2/FLU/RSV testing.  Fact Sheet for Patients: EntrepreneurPulse.com.au  Fact Sheet for Healthcare Providers: IncredibleEmployment.be  This test is not yet approved or cleared by the [place] FDA and has been authorized for detection and/or diagnosis of SARS-CoV-2 by FDA under an Emergency Use Authorization (EUA). This EUA will remain in effect (meaning this test can be used) for the duration of the COVID-19 declaration under Section 564(b)(1) of the Act, 21 U.S.C. section 360bbb-3(b)(1), unless the authorization is terminated or revoked.     Resp Syncytial Virus by PCR NEGATIVE NEGATIVE Final    Comment: (NOTE) Fact Sheet for Patients: EntrepreneurPulse.com.au  Fact Sheet for Healthcare Providers: IncredibleEmployment.be  This test is not yet approved or cleared by the [place] FDA and has been authorized for detection and/or diagnosis of SARS-CoV-2 by FDA under an Emergency Use  Authorization (EUA). This EUA will remain in effect (meaning this test can be used) for the duration of the COVID-19 declaration under Section 564(b)(1) of the Act, 21 U.S.C. section 360bbb-3(b)(1), unless the authorization is terminated or revoked.  Performed at [facility], [place] [address] [place], [place] [address]   Blood culture (routine x 2)     Status: None   Collection Time: 07/30/22  2:15 PM   Specimen: BLOOD LEFT ARM  Result Value Ref Range  Status   Specimen Description   Final    BLOOD LEFT ARM Performed at [facility], [place] [address] [place], [place] [address]    Special Requests   Final    BOTTLES DRAWN AEROBIC ONLY Blood Culture adequate volume Performed at [facility] [address] [place], [place] [address]    Culture   Final    NO GROWTH 5 DAYS Performed at [facility], [place] [address] [place], [place] [address]    Report Status 08/04/2022 FINAL  Final  MRSA Next Gen by PCR, Nasal     Status: Abnormal   Collection Time: 07/30/22 11:55 PM   Specimen: Nasal Mucosa; Nasal Swab  Result Value Ref Range Status   MRSA by PCR Next Gen DETECTED (A) NOT DETECTED Final    Comment: (NOTE) The GeneXpert MRSA Assay (FDA approved for NASAL specimens only), is one component of a comprehensive MRSA colonization surveillance program. It is not intended to diagnose MRSA infection nor to guide or monitor treatment for MRSA infections. Test performance is not FDA approved in patients less than 18 years old. Performed at [facility], [place] [address], [place], [place] [address]   Urine Culture (for pregnant, neutropenic or urologic patients or patients with an indwelling urinary catheter)     Status: Abnormal   Collection Time: 07/31/22  3:18 PM   Specimen: Urine, Catheterized  Result Value Ref Range Status   Specimen Description   Final    URINE, CATHETERIZED Performed at [facility] [address], [place], [place] [address]    Special Requests   Final    NONE Performed at [facility], [place] [address] [place], [place] [address]    Culture (A)  Final    >=100,000 COLONIES/mL METHICILLIN RESISTANT STAPHYLOCOCCUS AUREUS   Report Status 08/02/2022 FINAL  Final   Organism ID, Bacteria METHICILLIN RESISTANT STAPHYLOCOCCUS AUREUS (A)  Final      Susceptibility   Methicillin resistant staphylococcus aureus - MIC*    CIPROFLOXACIN >=8 RESISTANT Resistant     GENTAMICIN <=0.5 SENSITIVE Sensitive     NITROFURANTOIN <=16 SENSITIVE Sensitive     OXACILLIN >=4 RESISTANT Resistant     TETRACYCLINE <=1 SENSITIVE Sensitive     VANCOMYCIN 1 SENSITIVE Sensitive     TRIMETH/SULFA <=10 SENSITIVE Sensitive     CLINDAMYCIN >=8 RESISTANT Resistant     RIFAMPIN <=0.5 SENSITIVE Sensitive     Inducible Clindamycin NEGATIVE Sensitive     * >=100,000 COLONIES/mL METHICILLIN RESISTANT STAPHYLOCOCCUS AUREUS     Radiology Studies: No results found.   Scheduled Meds:  aspirin  81 mg Oral Daily   Chlorhexidine Gluconate Cloth  6 each Topical Daily   doxazosin  1 mg Oral Daily   gabapentin  200 mg Oral Daily   And   gabapentin  300 mg Oral Q24H   And   gabapentin  300 mg Oral QHS   heparin  5,000 Units Subcutaneous Q8H   leptospermum manuka honey  1 Application Topical Daily   linezolid  600 mg Oral Q12H   mirtazapine  7.5 mg Oral QHS   mupirocin ointment  1 Application Nasal BID   mupirocin ointment   Topical BID   OLANZapine zydis  5 mg Oral QHS   polyethylene glycol  17 g Oral Daily   Continuous Infusions:     LOS: 5 days    Time spent: 69 min  Georgette Shell,  MD  08/04/2022, 1:53 PM

## 2022-08-04 NOTE — Plan of Care (Signed)

## 2022-08-05 DIAGNOSIS — N1330 Unspecified hydronephrosis: Secondary | ICD-10-CM

## 2022-08-05 LAB — BASIC METABOLIC PANEL
Anion gap: 10 (ref 5–15)
BUN: 19 mg/dL (ref 8–23)
CO2: 27 mmol/L (ref 22–32)
Calcium: 8.3 mg/dL — ABNORMAL LOW (ref 8.9–10.3)
Chloride: 102 mmol/L (ref 98–111)
Creatinine, Ser: 1.67 mg/dL — ABNORMAL HIGH (ref 0.61–1.24)
GFR, Estimated: 43 mL/min — ABNORMAL LOW (ref >60)
Glucose, Bld: 94 mg/dL (ref 70–99)
Potassium: 3.8 mmol/L (ref 3.5–5.1)
Sodium: 139 mmol/L (ref 135–145)

## 2022-08-05 MED ORDER — MUPIROCIN 2 % EX OINT: TOPICAL_OINTMENT | Freq: Two times a day (BID) | CUTANEOUS | 0 refills | Status: DC

## 2022-08-05 MED ORDER — OLANZAPINE 5 MG PO TBDP: 5.0000 mg | ORAL_TABLET | Freq: Every day | ORAL | Status: DC

## 2022-08-05 MED ORDER — ACETAMINOPHEN 500 MG PO TABS: 1000.0000 mg | ORAL_TABLET | Freq: Three times a day (TID) | ORAL | 0 refills | Status: AC | PRN

## 2022-08-05 MED ORDER — MUPIROCIN 2 % EX OINT: 1.0000 | TOPICAL_OINTMENT | Freq: Two times a day (BID) | CUTANEOUS | 0 refills | Status: DC

## 2022-08-05 MED ORDER — OLANZAPINE 2.5 MG PO TABS: 2.5000 mg | ORAL_TABLET | Freq: Every morning | ORAL | 2 refills | Status: DC

## 2022-08-05 NOTE — Discharge Summary (Signed)
Physician Discharge Summary  Jason Jones [id number] DOB: 10/01/1950 DOA: 07/30/2022  PCP: Arthur Holms, NP  Admit date: 07/30/2022 Discharge date: 08/05/2022  Admitted From: NH Disposition:  NH Recommendations for Outpatient Follow-up:  Follow up with PCP in 1-2 weeks Please obtain BMP/CBC in one week  Home Health: None Equipment/Devices: None Discharge Condition: Stable CODE STATUS: DNR Diet recommendation: Cardiac Brief/Interim Summary:extremity weakness, dementia, prostate cancer, chronic indwelling Foley catheter.  Patient had stroke in November 2023 since then he has been living at [facility] as a long-term care resident.  Discussed with daughter, per daughter patient's baseline is he talks he eats he is bedbound.  For the last few days patient has not been eating or drinking well and has not been talking his baseline.  He is on a dysphagia 3 diet.  Nursing home sent him out to the hospital for concerns of possible new stroke versus sepsis.  Patient also had reportedly had fever at the nursing home.  History obtained from the ER records and the patient's daughter.  Patient not able to provide any history.  No chest pain shortness of breath or cough noted.  No nausea vomiting diarrhea noted. Urine in the Foley cloudy appearing. Patient was discharged from the hospital 07/24/2022 when he was admitted with.  Pneumonia fever of 101.6 with leukocytosis at that time.  He was treated with Levaquin.  PCCM was consulted at that time for pleural effusion who recommended to discharge to nursing home with Levaquin for total of 7 days which should have finished today.  Patient had swelling of his lips from ceftriaxone in his last hospital admission. His past medical history significant for asthma, GERD, hypertension, history of stroke, hepatitis C, vascular dementia, AKI.   On admission in the ER his labs were significant for man of 59 creatinine 4.70 sodium 140 potassium 3.8 albumin is 2.4 GFR 12  WBC 15.5, at the time of discharge his WBC count was 12.2 on 07/23/2022 CT head revealed no acute findings His Foley catheter was changed on 07/21/2022 Chest x-ray no active disease UA shows large amount of leukocytes with proteins bacteria and more than 50 WBC. ED Course: He received IV fluids, vancomycin, Flagyl, aztreonam.  Discharge Diagnoses:  Active Problems:   Normocytic anemia   Prostate cancer ([place])   Elevated liver enzymes   Rhabdomyolysis   Acute kidney injury ([place])   Malnutrition of moderate degree   BPH with urinary obstruction   Leukocytosis   Urinary retention   Hydronephrosis, bilateral   Urinary tract infection associated with indwelling urethral catheter ([place])   #1 acute metabolic encephalopathy likely secondary to multifactorial causes.Patient admitted with fever not eating or drinking.  On admission found to be in AKI and possible urinary tract infection. Creatinine 1.6  from 1.7 from  2.08 from 3.06 from  4.15 from 4.92 after placing Foley by urology. Sepsis bundle was initiated in the ER.  His blood pressure on admission was 100/60 pulse was 90 respiration 12 saturation 95% on 2 L Normal lactic acid Leukocytosis  resolved  Patient received vancomycin and aztreonam and Flagyl in the ER.  He is allergic to Rocephin and doxycycline. He finished a course of zyvox   #2 fever likely source urine.  Patient has BPH chronic Foley catheter and prostate cancer.  He was discharged from the hospital on 07/24/2022 on Levaquin for 7 days for pneumonia the last date would have been 07/30/2022. urine culture with MRSA and MRSA screen positive.   He finished  a course of Zyvox  x.#3 AKI-resolved with IV fluids and placing Foley. multifactorial secondary to acute urinary retention secondary to BPH  prostate cancer and from prerenal with decreased p.o. intake. Renal functions improving after placement of Foley by urology.  Follow-up with Dr. Lovena Neighbours urology  renal ultra sound  showed-Moderate bilateral hydronephrosis with marked urinary bladder distension. Findings are most suggestive of bladder outlet obstruction.Marked increased cortical echogenicity of the bilateral kidneys compatible with medical renal disease.Debris in the urinary bladder may be infectious or hemorrhagic. There is also a masslike appearance at the bladder base. Urologic consultation is suggested in this patient with history of prostate neoplasm. Cystoscopic follow-up may be warranted given the appearance of the bladder base to exclude concomitant urothelial neoplasm or bladder base invasion from prostate cancer.   #4 dementia with behaviors-he is being discharged on Zyprexa 2.5 mg in the morning and 5 mg at night to help with sleep and behaviors.      #5 hypertension continue home medications.   #6 chronic anemia patient received 2 units of blood transfusion earlier this month.  Patient was seen by GI during that admission for FOBT positive no colonoscopy was planned.  Hemoglobin remained stable during this admission.  #7 CODE STATUS discussed with patient's daughter-DNR   #8  hypokalemia resolved   #9 chronic non healing full thickness wounds LLE-wound care recommending Medihoney  Pressure Injury 07/20/22 Ankle Anterior;Left Unstageable - Full thickness tissue loss in which the base of the injury is covered by slough (yellow, tan, gray, green or brown) and/or eschar (tan, brown or black) in the wound bed. (Active)  07/20/22 1809  Location: Ankle  Location Orientation: Anterior;Left  Staging: Unstageable - Full thickness tissue loss in which the base of the injury is covered by slough (yellow, tan, gray, green or brown) and/or eschar (tan, brown or black) in the wound bed.  Wound Description (Comments):   Present on Admission: Yes  Dressing Type Gauze (Comment) 08/05/22 0843     Pressure Injury 07/20/22 Foot Anterior;Left Stage 3 -  Full thickness tissue loss. Subcutaneous fat may be visible  but bone, tendon or muscle are NOT exposed. (Active)  07/20/22 1810  Location: Foot  Location Orientation: Anterior;Left  Staging: Stage 3 -  Full thickness tissue loss. Subcutaneous fat may be visible but bone, tendon or muscle are NOT exposed.  Wound Description (Comments):   Present on Admission: Yes  Dressing Type Gauze (Comment) 08/05/22 0843    Estimated body mass index is 18.76 kg/m as calculated from the following:   Height as of this encounter: '5\' 11"'$  (1.803 m).   Weight as of this encounter: 61 kg.  Discharge Instructions  Discharge Instructions     Diet - low sodium heart healthy   Complete by: As directed    Discharge wound care:   Complete by: As directed    SEE ORDERS   Increase activity slowly   Complete by: As directed       Allergies as of 08/05/2022       Reactions   Ceftriaxone Swelling, Other (See Comments)   Possible lips swelling (dose 07/21/22) "Allergic," per facility   Doxycycline Other (See Comments)   "ALLERGIC," per facility document        Medication List     TAKE these medications    acetaminophen 500 MG tablet Commonly known as: TYLENOL Take 2 tablets (1,000 mg total) by mouth every 8 (eight) hours as needed for mild pain, headache or moderate pain.  albuterol (2.5 MG/3ML) 0.083% nebulizer solution Commonly known as: PROVENTIL Take 3 mLs (2.5 mg total) by nebulization every 4 (four) hours as needed for wheezing or shortness of breath. What changed: when to take this   AQUAPHOR OINTMENT BODY EX Apply 1 application  topically See admin instructions. Apply to the lower legs and feet every shift   aspirin 81 MG chewable tablet Chew 1 tablet (81 mg total) by mouth daily. What changed: when to take this   atenolol 25 MG tablet Commonly known as: TENORMIN Take 12.5 mg by mouth in the morning.   b complex vitamins capsule Take 1 capsule by mouth in the morning.   baclofen 10 MG tablet Commonly known as: LIORESAL Take 10 mg by  mouth See admin instructions. Take 10 mg by mouth in the morning and at 5 PM   CALCIUM ALGINATE EX Apply 1 application  topically See admin instructions. Calcium Alginate-Silver external pad 4"x 5". Apply to left ankle and foot topically every day shift every other day for wound healing.   doxazosin 1 MG tablet Commonly known as: CARDURA Take 1 mg by mouth in the morning.   feeding supplement Liqd Take 237 mLs by mouth 2 (two) times daily between meals.   ferrous gluconate 324 MG tablet Commonly known as: FERGON Take 324 mg by mouth daily with breakfast.   gabapentin 100 MG capsule Commonly known as: NEURONTIN Take 200-300 mg by mouth See admin instructions. Take 200 mg by mouth in the morning, 300 mg at 5 PM, and 300 mg at bedtime   leptospermum manuka honey Pste paste Apply 1 Application topically daily.   mirtazapine 7.5 MG tablet Commonly known as: REMERON Take 7.5 mg by mouth at bedtime.   mupirocin ointment 2 % Commonly known as: BACTROBAN Place 1 Application into the nose 2 (two) times daily.   mupirocin ointment 2 % Commonly known as: BACTROBAN Apply topically 2 (two) times daily.   NON FORMULARY Take 120 mLs by mouth See admin instructions. MedPass 2.0 - Drink 120 ml's by mouth three times a day   OLANZapine zydis 5 MG disintegrating tablet Commonly known as: ZYPREXA Take 1 tablet (5 mg total) by mouth at bedtime. What changed:  when to take this reasons to take this   OLANZapine 2.5 MG tablet Commonly known as: ZyPREXA Take 1 tablet (2.5 mg total) by mouth every morning. What changed: You were already taking a medication with the same name, and this prescription was added. Make sure you understand how and when to take each.   polyethylene glycol 17 g packet Commonly known as: MIRALAX / GLYCOLAX Take 17 g by mouth daily as needed. What changed: reasons to take this   rosuvastatin 20 MG tablet Commonly known as: CRESTOR Take 1 tablet (20 mg total) by  mouth daily. What changed: when to take this   senna-docusate 8.6-50 MG tablet Commonly known as: Senokot-S Take 1 tablet by mouth at bedtime.   sodium chloride 0.65 % Soln nasal spray Commonly known as: OCEAN Place 1 spray into both nostrils as needed for congestion.   triamcinolone cream 0.1 % Commonly known as: KENALOG Apply 1 Application topically See admin instructions. Apply to rashes every day and evening shift- right arm, right thigh, left thigh, and any other affected areas               Discharge Care Instructions  (From admission, onward)           Start  Ordered   08/05/22 0000  Discharge wound care:       Comments: SEE ORDERS   08/05/22 1116            Follow-up Information     Ceasar Mons, MD Follow up.   Specialty: Urology Contact information: [address] [place] [address] [phone]                Allergies  Allergen Reactions   Ceftriaxone Swelling and Other (See Comments)    Possible lips swelling (dose 07/21/22) "Allergic," per facility   Doxycycline Other (See Comments)    "ALLERGIC," per facility document    Consultations: none   Procedures/Studies: US RENAL  Result Date: 07/31/2022 CLINICAL DATA:  Acute kidney injury. EXAM: RENAL / URINARY TRACT ULTRASOUND COMPLETE COMPARISON:  Comparison made with imaging from November of 2023 FINDINGS: Right Kidney: Renal measurements: 11.0 x 6.2 x 7.7 cm = volume: 186 mL. Mild to moderate RIGHT-sided hydronephrosis. Transition point not evaluated. Marked cortical echogenicity of the RIGHT kidney with poor corticomedullary differentiation. Left Kidney: Renal measurements: 10.5 x 6.4 x 5.2 cm = volume: 185 mL. Marked increased parenchymal echogenicity with moderate LEFT-sided hydronephrosis. Bladder: Marked distension of the urinary bladder approximally 16 x 13 x 8.9 (volume = 970 cc) cm Echogenic debris in the dependent urinary bladder. Masslike area  also present in the dependent urinary bladder measuring up to 5.8 x 3.8 cm, not clearly arising from the prostate. Median lobe hypertrophy was seen on previous imaging. Other: None. IMPRESSION: Moderate bilateral hydronephrosis with marked urinary bladder distension. Findings are most suggestive of bladder outlet obstruction. Marked increased cortical echogenicity of the bilateral kidneys compatible with medical renal disease. Debris in the urinary bladder may be infectious or hemorrhagic. There is also a masslike appearance at the bladder base. Urologic consultation is suggested in this patient with history of prostate neoplasm. Cystoscopic follow-up may be warranted given the appearance of the bladder base to exclude concomitant urothelial neoplasm or bladder base invasion from prostate cancer. Electronically Signed   By: Zetta Bills M.D.   On: 07/31/2022 12:13   CT Head Wo Contrast  Result Date: 07/30/2022 CLINICAL DATA:  Mental status change, unknown cause EXAM: CT HEAD WITHOUT CONTRAST TECHNIQUE: Contiguous axial images were obtained from the base of the skull through the vertex without intravenous contrast. RADIATION DOSE REDUCTION: This exam was performed according to the departmental dose-optimization program which includes automated exposure control, adjustment of the mA and/or kV according to patient size and/or use of iterative reconstruction technique. COMPARISON:  MRI head March 25, 2022. FINDINGS: Brain: Encephalomalacia in the inferior right frontal lobe and right PCA territory. No evidence of acute large vascular territory infarct, acute hemorrhage, mass lesion or midline shift. No hydrocephalus. Vascular: No hyperdense vessel identified. Skull: No acute fracture. Sinuses/Orbits: Clear sinuses.  No acute orbital findings. Other: No mastoid effusions. IMPRESSION: 1. No evidence of acute intracranial abnormality. 2. Areas of encephalomalacia. Electronically Signed   By: Margaretha Sheffield M.D.    On: 07/30/2022 12:01   DG Chest Port 1 View  Result Date: 07/30/2022 CLINICAL DATA:  Fever and altered mental status EXAM: PORTABLE CHEST 1 VIEW COMPARISON:  Chest radiograph dated 05/22/2023 FINDINGS: Unchanged elevation of the left hemidiaphragm. Left basilar linear opacities. No pleural effusion or pneumothorax. The heart size and mediastinal contours are within normal limits. Multiple healed bilateral rib fractures. IMPRESSION: No active disease. Electronically Signed   By: Shawn Route.D.  On: 07/30/2022 10:35   DG Chest 2 View  Result Date: 07/22/2022 CLINICAL DATA:  Pleural effusion. EXAM: CHEST - 2 VIEW COMPARISON:  07/20/2022 FINDINGS: Patient is rotated towards the LEFT. The heart size is normal. RIGHT lung is clear. There is asymmetry of the hemidiaphragms, LEFT higher and more irregular compared to the RIGHT. Findings raise the question of subpulmonic effusion on the LEFT. There are no consolidations. Remote LEFT rib fractures are present. IMPRESSION: LEFT pleural effusion. Electronically Signed   By: Nolon Nations M.D.   On: 07/22/2022 08:40   DG Chest Port 1 View  Result Date: 07/20/2022 CLINICAL DATA:  Sepsis EXAM: PORTABLE CHEST 1 VIEW COMPARISON:  Fifteen 3 FINDINGS: Left base consolidation and evidence fusion. Lungs are otherwise clear. No pneumothorax. Calcified ectatic aorta. Unremarkable cardiac silhouette. IMPRESSION: Left base consolidation and effusion. Electronically Signed   By: Sammie Bench M.D.   On: 07/20/2022 15:04   (Echo, Carotid, EGD, Colonoscopy, ERCP)    Subjective: No new events FOLEY  IN PLACE  Discharge Exam: Vitals:   08/04/22 1226 08/04/22 2119  BP: 127/69 (!) 159/88  Pulse: 89 89  Resp: 16 18  Temp: 98.3 F (36.8 C) (!) 97.4 F (36.3 C)  SpO2: 94% 98%   Vitals:   08/03/22 1928 08/04/22 0556 08/04/22 1226 08/04/22 2119  BP: (!) 143/84 126/82 127/69 (!) 159/88  Pulse: 94 76 89 89  Resp: '19 18 16 18  '$ Temp: 97.6 F (36.4 C) (!) 97.5 F  (36.4 C) 98.3 F (36.8 C) (!) 97.4 F (36.3 C)  TempSrc:  Oral Oral Oral  SpO2: 96% 95% 94% 98%  Weight:      Height:        General: Pt is alert, awake, not in acute distress Cardiovascular: RRR, S1/S2 +, no rubs, no gallops Respiratory: CTA bilaterally, no wheezing, no rhonchi Abdominal: Soft, NT, ND, bowel sounds + Extremities:LLE edema, no cyanosis    The results of significant diagnostics from this hospitalization (including imaging, microbiology, ancillary and laboratory) are listed below for reference.     Microbiology: Recent Results (from the past 240 hour(s))  Blood culture (routine x 2)     Status: None   Collection Time: 07/30/22 10:11 AM   Specimen: BLOOD  Result Value Ref Range Status   Specimen Description   Final    BLOOD BLOOD LEFT FOREARM Performed at [facility] [address], [place], [place] [address]    Special Requests   Final    BOTTLES DRAWN AEROBIC AND ANAEROBIC Blood Culture adequate volume Performed at [facility] [address] [place], [place] [address]    Culture   Final    NO GROWTH 5 DAYS Performed at [facility], [place] [address], [place], [place] [address]    Report Status 08/04/2022 FINAL  Final  Resp panel by RT-PCR (RSV, Flu A&B, Covid) Anterior Nasal Swab     Status: None   Collection Time: 07/30/22 10:34 AM   Specimen: Anterior Nasal Swab  Result Value Ref Range Status   SARS Coronavirus 2 by RT PCR NEGATIVE NEGATIVE Final    Comment: (NOTE) SARS-CoV-2 target nucleic acids are NOT DETECTED.  The SARS-CoV-2 RNA is generally detectable in upper respiratory specimens during the acute phase of infection. The lowest concentration of SARS-CoV-2 viral copies this assay can detect is 138 copies/mL. A negative result does not preclude SARS-Cov-2 infection and should not be used as the sole basis for treatment or  other patient management decisions. A negative result may occur with   improper specimen collection/handling, submission of specimen other than nasopharyngeal swab, presence of viral mutation(s) within the areas targeted by this assay, and inadequate number of viral copies(<138 copies/mL). A negative result must be combined with clinical observations, patient history, and epidemiological information. The expected result is Negative.  Fact Sheet for Patients:  EntrepreneurPulse.com.au  Fact Sheet for Healthcare Providers:  IncredibleEmployment.be  This test is no t yet approved or cleared by the [place] FDA and  has been authorized for detection and/or diagnosis of SARS-CoV-2 by FDA under an Emergency Use Authorization (EUA). This EUA will remain  in effect (meaning this test can be used) for the duration of the COVID-19 declaration under Section 564(b)(1) of the Act, 21 U.S.C.section 360bbb-3(b)(1), unless the authorization is terminated  or revoked sooner.       Influenza A by PCR NEGATIVE NEGATIVE Final   Influenza B by PCR NEGATIVE NEGATIVE Final    Comment: (NOTE) The Xpert Xpress SARS-CoV-2/FLU/RSV plus assay is intended as an aid in the diagnosis of influenza from Nasopharyngeal swab specimens and should not be used as a sole basis for treatment. Nasal washings and aspirates are unacceptable for Xpert Xpress SARS-CoV-2/FLU/RSV testing.  Fact Sheet for Patients: EntrepreneurPulse.com.au  Fact Sheet for Healthcare Providers: IncredibleEmployment.be  This test is not yet approved or cleared by the [place] FDA and has been authorized for detection and/or diagnosis of SARS-CoV-2 by FDA under an Emergency Use Authorization (EUA). This EUA will remain in effect (meaning this test can be used) for the duration of the COVID-19 declaration under Section 564(b)(1) of the Act, 21 U.S.C. section 360bbb-3(b)(1), unless the authorization is terminated or revoked.      Resp Syncytial Virus by PCR NEGATIVE NEGATIVE Final    Comment: (NOTE) Fact Sheet for Patients: EntrepreneurPulse.com.au  Fact Sheet for Healthcare Providers: IncredibleEmployment.be  This test is not yet approved or cleared by the [place] FDA and has been authorized for detection and/or diagnosis of SARS-CoV-2 by FDA under an Emergency Use Authorization (EUA). This EUA will remain in effect (meaning this test can be used) for the duration of the COVID-19 declaration under Section 564(b)(1) of the Act, 21 U.S.C. section 360bbb-3(b)(1), unless the authorization is terminated or revoked.  Performed at [facility], [place] [address], [place], [place] [address]   Blood culture (routine x 2)     Status: None   Collection Time: 07/30/22  2:15 PM   Specimen: BLOOD LEFT ARM  Result Value Ref Range Status   Specimen Description   Final    BLOOD LEFT ARM Performed at [facility], [place] [address], [place], [place] [address]    Special Requests   Final    BOTTLES DRAWN AEROBIC ONLY Blood Culture adequate volume Performed at [facility] [address] [place], [place] [address]    Culture   Final    NO GROWTH 5 DAYS Performed at [facility], [place] [address], [place], [place] [address]    Report Status 08/04/2022 FINAL  Final  MRSA Next Gen by PCR, Nasal     Status: Abnormal   Collection Time: 07/30/22 11:55 PM   Specimen: Nasal Mucosa; Nasal Swab  Result Value Ref Range Status   MRSA by PCR Next Gen DETECTED (A) NOT DETECTED Final    Comment: (NOTE) The GeneXpert MRSA Assay (FDA approved for NASAL specimens only), is one component of a  comprehensive MRSA colonization surveillance program. It is not intended to diagnose MRSA infection nor to guide or monitor treatment for MRSA infections. Test performance is not FDA approved in patients less than 13 years old. Performed at [facility], [place] [address], [place], [place] [address]   Urine Culture (for pregnant, neutropenic or urologic patients or patients with an indwelling urinary catheter)     Status: Abnormal   Collection Time: 07/31/22  3:18 PM   Specimen: Urine, Catheterized  Result Value Ref Range Status   Specimen Description   Final    URINE, CATHETERIZED Performed at [facility] [address] [place], [place] [address]    Special Requests   Final    NONE Performed at [facility], [place] [address], [place], [place] [address]    Culture (A)  Final    >=100,000 COLONIES/mL METHICILLIN RESISTANT STAPHYLOCOCCUS AUREUS   Report Status 08/02/2022 FINAL  Final   Organism ID, Bacteria METHICILLIN RESISTANT STAPHYLOCOCCUS AUREUS (A)  Final      Susceptibility   Methicillin resistant staphylococcus aureus - MIC*    CIPROFLOXACIN >=8 RESISTANT Resistant     GENTAMICIN <=0.5 SENSITIVE Sensitive     NITROFURANTOIN <=16 SENSITIVE Sensitive     OXACILLIN >=4 RESISTANT Resistant     TETRACYCLINE <=1 SENSITIVE Sensitive     VANCOMYCIN 1 SENSITIVE Sensitive     TRIMETH/SULFA <=10 SENSITIVE Sensitive     CLINDAMYCIN >=8 RESISTANT Resistant     RIFAMPIN <=0.5 SENSITIVE Sensitive     Inducible Clindamycin NEGATIVE Sensitive     * >=100,000 COLONIES/mL METHICILLIN RESISTANT STAPHYLOCOCCUS AUREUS     Labs: BNP (last 3 results) No results for input(s): "BNP" in the last 8760 hours. Basic Metabolic Panel: Recent Labs  Lab 08/01/22 0402 08/02/22 0340 08/03/22 0742 08/04/22 0345 08/05/22 0706  NA 141 144 143 144 139  K 3.9 3.2* 2.9* 4.2 3.8  CL 108 108 103 109 102  CO2 20* '24 27 27 27  '$ GLUCOSE 85 97 100* 97 94  BUN 52* 44* 29* 24* 19  CREATININE 4.15* 3.06* 2.08* 1.74* 1.67*  CALCIUM 8.5* 8.2* 8.4* 8.3* 8.3*   Liver Function Tests: Recent Labs  Lab 07/30/22 1007 07/31/22 0349  AST 19 24  ALT 21 20  ALKPHOS 49 43  BILITOT 0.4 0.6  PROT 8.3* 7.2   ALBUMIN 2.4* 2.3*   No results for input(s): "LIPASE", "AMYLASE" in the last 168 hours. Recent Labs  Lab 07/30/22 1011  AMMONIA 20   CBC: Recent Labs  Lab 07/30/22 1007 07/30/22 1415 07/31/22 0349 08/01/22 0402 08/02/22 0340 08/03/22 0742 08/04/22 0345  WBC 15.5*   < > 17.8* 14.3* 10.4 9.3 6.2  NEUTROABS 13.3*  --   --   --   --   --   --   HGB 9.7*   < > 9.4* 9.1* 8.4* 8.5* 8.6*  HCT 32.1*   < > 30.0* 29.5* 26.9* 27.5* 27.6*  MCV 86.8   < > 85.7 86.3 84.9 84.6 86.0  PLT 463*   < > 416* 392 351 419* 326   < > = values in this interval not displayed.   Cardiac Enzymes: No results for input(s): "CKTOTAL", "CKMB", "CKMBINDEX", "TROPONINI" in the last 168 hours. BNP: Invalid input(s): "POCBNP" CBG: No results for input(s): "GLUCAP" in the last 168 hours. D-Dimer No results for input(s): "DDIMER" in the last 72 hours. Hgb A1c No results for input(s): "HGBA1C" in the last 72  hours. Lipid Profile No results for input(s): "CHOL", "HDL", "LDLCALC", "TRIG", "CHOLHDL", "LDLDIRECT" in the last 72 hours. Thyroid function studies No results for input(s): "TSH", "T4TOTAL", "T3FREE", "THYROIDAB" in the last 72 hours.  Invalid input(s): "FREET3" Anemia work up No results for input(s): "VITAMINB12", "FOLATE", "FERRITIN", "TIBC", "IRON", "RETICCTPCT" in the last 72 hours. Urinalysis    Component Value Date/Time   COLORURINE YELLOW 07/30/2022 1221   APPEARANCEUR CLOUDY (A) 07/30/2022 1221   LABSPEC 1.012 07/30/2022 1221   PHURINE 5.0 07/30/2022 1221   GLUCOSEU NEGATIVE 07/30/2022 1221   HGBUR LARGE (A) 07/30/2022 1221   BILIRUBINUR NEGATIVE 07/30/2022 1221   KETONESUR NEGATIVE 07/30/2022 1221   PROTEINUR 100 (A) 07/30/2022 1221   UROBILINOGEN 0.2 03/16/2008 0733   NITRITE NEGATIVE 07/30/2022 1221   LEUKOCYTESUR LARGE (A) 07/30/2022 1221   Sepsis Labs Recent Labs  Lab 08/01/22 0402 08/02/22 0340 08/03/22 0742 08/04/22 0345  WBC 14.3* 10.4 9.3 6.2    Microbiology Recent Results (from the past 240 hour(s))  Blood culture (routine x 2)     Status: None   Collection Time: 07/30/22 10:11 AM   Specimen: BLOOD  Result Value Ref Range Status   Specimen Description   Final    BLOOD BLOOD LEFT FOREARM Performed at [facility], [place] [address], [place], [place] [address]    Special Requests   Final    BOTTLES DRAWN AEROBIC AND ANAEROBIC Blood Culture adequate volume Performed at [facility] [address] [place], [place] [address]    Culture   Final    NO GROWTH 5 DAYS Performed at [facility], [place] [address], [place], [place] [address]    Report Status 08/04/2022 FINAL  Final  Resp panel by RT-PCR (RSV, Flu A&B, Covid) Anterior Nasal Swab     Status: None   Collection Time: 07/30/22 10:34 AM   Specimen: Anterior Nasal Swab  Result Value Ref Range Status   SARS Coronavirus 2 by RT PCR NEGATIVE NEGATIVE Final    Comment: (NOTE) SARS-CoV-2 target nucleic acids are NOT DETECTED.  The SARS-CoV-2 RNA is generally detectable in upper respiratory specimens during the acute phase of infection. The lowest concentration of SARS-CoV-2 viral copies this assay can detect is 138 copies/mL. A negative result does not preclude SARS-Cov-2 infection and should not be used as the sole basis for treatment or other patient management decisions. A negative result may occur with  improper specimen collection/handling, submission of specimen other than nasopharyngeal swab, presence of viral mutation(s) within the areas targeted by this assay, and inadequate number of viral copies(<138 copies/mL). A negative result must be combined with clinical observations, patient history, and epidemiological information. The expected result is Negative.  Fact Sheet for Patients:  EntrepreneurPulse.com.au  Fact Sheet for Healthcare Providers:  IncredibleEmployment.be  This test  is no t yet approved or cleared by the [place] FDA and  has been authorized for detection and/or diagnosis of SARS-CoV-2 by FDA under an Emergency Use Authorization (EUA). This EUA will remain  in effect (meaning this test can be used) for the duration of the COVID-19 declaration under Section 564(b)(1) of the Act, 21 U.S.C.section 360bbb-3(b)(1), unless the authorization is terminated  or revoked sooner.       Influenza A by PCR NEGATIVE NEGATIVE Final   Influenza B by PCR NEGATIVE NEGATIVE Final    Comment: (NOTE) The Xpert Xpress SARS-CoV-2/FLU/RSV plus assay is intended as an aid in the diagnosis of influenza from Nasopharyngeal swab  specimens and should not be used as a sole basis for treatment. Nasal washings and aspirates are unacceptable for Xpert Xpress SARS-CoV-2/FLU/RSV testing.  Fact Sheet for Patients: EntrepreneurPulse.com.au  Fact Sheet for Healthcare Providers: IncredibleEmployment.be  This test is not yet approved or cleared by the [place] FDA and has been authorized for detection and/or diagnosis of SARS-CoV-2 by FDA under an Emergency Use Authorization (EUA). This EUA will remain in effect (meaning this test can be used) for the duration of the COVID-19 declaration under Section 564(b)(1) of the Act, 21 U.S.C. section 360bbb-3(b)(1), unless the authorization is terminated or revoked.     Resp Syncytial Virus by PCR NEGATIVE NEGATIVE Final    Comment: (NOTE) Fact Sheet for Patients: EntrepreneurPulse.com.au  Fact Sheet for Healthcare Providers: IncredibleEmployment.be  This test is not yet approved or cleared by the [place] FDA and has been authorized for detection and/or diagnosis of SARS-CoV-2 by FDA under an Emergency Use Authorization (EUA). This EUA will remain in effect (meaning this test can be used) for the duration of the COVID-19 declaration under Section  564(b)(1) of the Act, 21 U.S.C. section 360bbb-3(b)(1), unless the authorization is terminated or revoked.  Performed at [facility], [place] [address] [place], [place] [address]   Blood culture (routine x 2)     Status: None   Collection Time: 07/30/22  2:15 PM   Specimen: BLOOD LEFT ARM  Result Value Ref Range Status   Specimen Description   Final    BLOOD LEFT ARM Performed at [facility], [place] [address], [place], [place] [address]    Special Requests   Final    BOTTLES DRAWN AEROBIC ONLY Blood Culture adequate volume Performed at [facility] [address] [place], [place] [address]    Culture   Final    NO GROWTH 5 DAYS Performed at [facility], [place] [address], [place], [place] [address]    Report Status 08/04/2022 FINAL  Final  MRSA Next Gen by PCR, Nasal     Status: Abnormal   Collection Time: 07/30/22 11:55 PM   Specimen: Nasal Mucosa; Nasal Swab  Result Value Ref Range Status   MRSA by PCR Next Gen DETECTED (A) NOT DETECTED Final    Comment: (NOTE) The GeneXpert MRSA Assay (FDA approved for NASAL specimens only), is one component of a comprehensive MRSA colonization surveillance program. It is not intended to diagnose MRSA infection nor to guide or monitor treatment for MRSA infections. Test performance is not FDA approved in patients less than 5 years old. Performed at [facility], [place] [address], [place], [place] [address]   Urine Culture (for pregnant, neutropenic or urologic patients or patients with an indwelling urinary catheter)     Status: Abnormal   Collection Time: 07/31/22  3:18 PM   Specimen: Urine, Catheterized  Result Value Ref Range Status   Specimen Description   Final    URINE, CATHETERIZED Performed at [facility] [address], [place], [place] [address]    Special Requests   Final    NONE Performed at [facility], [place]  [address], [place], [place] [address]    Culture (A)  Final    >=100,000 COLONIES/mL METHICILLIN RESISTANT STAPHYLOCOCCUS AUREUS   Report Status 08/02/2022 FINAL  Final   Organism ID, Bacteria METHICILLIN RESISTANT STAPHYLOCOCCUS AUREUS (A)  Final      Susceptibility   Methicillin resistant staphylococcus aureus - MIC*  CIPROFLOXACIN >=8 RESISTANT Resistant     GENTAMICIN <=0.5 SENSITIVE Sensitive     NITROFURANTOIN <=16 SENSITIVE Sensitive     OXACILLIN >=4 RESISTANT Resistant     TETRACYCLINE <=1 SENSITIVE Sensitive     VANCOMYCIN 1 SENSITIVE Sensitive     TRIMETH/SULFA <=10 SENSITIVE Sensitive     CLINDAMYCIN >=8 RESISTANT Resistant     RIFAMPIN <=0.5 SENSITIVE Sensitive     Inducible Clindamycin NEGATIVE Sensitive     * >=100,000 COLONIES/mL METHICILLIN RESISTANT STAPHYLOCOCCUS AUREUS     Time coordinating discharge: 38 minutes  SIGNED:  Georgette Shell, MD  [facility] 08/05/2022, 11:55 AM

## 2022-08-05 NOTE — Plan of Care (Signed)

## 2022-08-05 NOTE — Care Management Important Message (Signed)
Important Message  Patient Details IM Letter given. Name: Jason Jones MRN: [id number] Date of Birth: 11/18/1950   Medicare Important Message Given:  Yes     Kerin Salen 08/05/2022, 12:45 PM

## 2022-08-05 NOTE — Progress Notes (Signed)
Report given to [facility] LPM.

## 2022-08-05 NOTE — TOC Transition Note (Signed)
Transition of Care [facility]) - CM/SW Discharge Note   Patient Details  Name: Jason Jones MRN: [id number] Date of Birth: [birth year]  [facility] CM/SW Contact:  Vassie Moselle, LCSW Phone Number: 08/05/2022, 12:19 PM   Clinical Narrative:    Pt is to return to [facility]. Pt will be going to room 418b. RN to call report to [employer], RN at [phone]. CSW spoke with pt's daughter who is aware and agreeable to discharge. Pt will be transported to facility via [facility]. [facility] called at 12:20pm.    Final next level of care: Long Term Nursing Home Barriers to Discharge: Barriers Resolved   Patient Goals and CMS Choice CMS Medicare.gov Compare Post Acute Care list provided to:: Patient Represenative (must comment) Choice offered to / list presented to : Adult Children  Discharge Placement                Patient chooses bed at: [facility] Patient to be transferred to facility by: [place] Name of family member notified: Donita Brooks Patient and family notified of of transfer: 08/05/22  Discharge Plan and Services Additional resources added to the After Visit Summary for   In-house Referral: NA Discharge Planning Services: NA Post Acute Care Choice: Nursing Home, Resumption of Svcs/PTA Provider          DME Arranged: N/A DME Agency: NA                  Social Determinants of Health (SDOH) Interventions SDOH Screenings   Food Insecurity: No Food Insecurity (08/03/2022)  Housing: Low Risk  (08/03/2022)  Transportation Needs: No Transportation Needs (08/03/2022)  Utilities: Not At Risk (08/03/2022)  Tobacco Use: High Risk (08/03/2022)     Readmission Risk Interventions    08/05/2022   12:17 PM 07/31/2022   12:11 PM  Readmission Risk Prevention Plan  Transportation Screening Complete Complete  PCP or Specialist Appt within 3-5 Days  Complete  HRI or [place]  Complete  Social Work Consult for [place] Planning/Counseling  Complete  Palliative  Care Screening  Not Applicable  Medication Review [employer]) Complete Complete  PCP or Specialist appointment within 3-5 days of discharge Complete   HRI or [place] Complete   SW Recovery Care/Counseling Consult Complete   Palliative Care Screening Not Applicable   Skilled Nursing Facility Complete

## 2022-08-29 ENCOUNTER — Encounter: Payer: Self-pay | Admitting: Neurology

## 2022-08-29 ENCOUNTER — Ambulatory Visit (INDEPENDENT_AMBULATORY_CARE_PROVIDER_SITE_OTHER): Payer: 59 | Admitting: Neurology

## 2022-08-29 VITALS — BP 163/88 | HR 88

## 2022-08-29 DIAGNOSIS — I69359 Hemiplegia and hemiparesis following cerebral infarction affecting unspecified side: Secondary | ICD-10-CM

## 2022-08-29 DIAGNOSIS — Z8673 Personal history of transient ischemic attack (TIA), and cerebral infarction without residual deficits: Secondary | ICD-10-CM

## 2022-08-29 NOTE — Patient Instructions (Signed)
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 I also advised the patient to eat a healthy diet with plenty of whole grains, cereals, fruits and vegetables, exercise regularly and maintain ideal body weight .  No scheduled follow-up visit is necessary but can be referred back in the future as needed

## 2022-08-29 NOTE — Progress Notes (Signed)
Patient: Jason Jones Date of Birth: 1950/10/16  Reason for Visit: Stroke Clinic Follow Up History from: Patient, brother  Primary Neurologist: Dr. Leonie Man (Saw Dr. Erlinda Hong)      HISTORY OF PRESENT ILLNESS: Today 08/29/22 he returns for follow-up after last visit 4 months ago.  He is at a accompanied by transporter from nursing home where he lives.  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  Remains bedbound And 24 Hours care.  He has shown no improvement.  He has been hospitalized last month twice initially for pneumonia and later for evaluation urinary tract infection, encephalopathy and acute renal injury.  The symptoms improved and mental status is now back to baseline.  He had CT scan of the head done on 07/2522 which showed no acute findings.  Remains on aspirin tolerating well.  He has no complaints today. Last visit 05/01/2022 Butler Denmark, NP )Knute Neu presents today for stroke clinic follow-up.  Brought to the ER by EMS after being found down on the floor for 2 days prior on 03/24/22.  MRI showed right PCA territory infarct.  Had left hemianopia and left hemiplegia.  Discharged to rehab.   Remains on at [facility] for rehab, is involved in PT/OT/ST. He was living in a high-rise senior apartment prior. Remains on aspirin 81 mg and Plavix 75 mg. He has stopped cigarettes. Is currently wear Zio patch, was placed 1 week ago. BP 104/69 on Norvasc 5 mg. Started on Crestor 20 mg. Swallowing is improved, gets thickened liquids, soft foods, not choking. 04/14/22 normal ALT AST. Speech is slurred, about 60% back to normal. Foley was placed after hospitalization due to inability to void, potentially neurogenic bladder? 04/12/22 he pulled the catheter out at the facility, went to ER, Admitted 04/12/22 for bilateral hydronephrosis, gross hematuria. Still some hematuria in the urine bag. Complicated by need for dual antiplatelet therapy. Urology  consulted and likely etiology is trauma in setting of friable post-radiation prostatic tissue.   -Code stroke CT head showed subacute right PCA distribution infarct -MRI of the brain large area of acute infarction in the right PCA territory including portions of the right thalamus and right cerebral peduncle -MRA of the head occlusion of right PCA P1 segment, probable 50% stenosis of the proximal left ICA -2D echo 60 to 65% -LDL 84 -A1c 6.0 -No antithrombotic prior to admission, 3 months aspirin 81 mg daily and Plavix 75 mg daily, then aspirin alone given intracranial vascular occlusion -Sent to rehab  HISTORY  Copied 03/24/22 Dr. Lorrin Goodell: [name] is a 72 y.o. male with PMH significant for alcohol use, GERD, hypertension hepatitis C, smoker who was brought in by EMS for being down on the floor at his home for the last 2 days.   Patient reports that on Friday 10/13, he had sudden onset left-sided weakness and slid out of his couch and onto the floor.  For the last 2 days has been banging on the wall and his neighbors eventually heard him and called 911.  He was noted to be weak on the left side.  CT head demonstrated a subacute right PCA infarct.   He received Ativan before MRI and is somnolent and somewhat difficult to get history out of.   LKW: 03/22/2022. mRS: 0 tNKASE: Not offered he is outside the window. Thrombectomy: Not on for days outside the window.  REVIEW OF SYSTEMS: Out of a complete 14 system review of symptoms, the patient complains only  of the following symptoms, and all other reviewed systems are negative.  See HPI  ALLERGIES: Allergies  Allergen Reactions   Ceftriaxone Swelling and Other (See Comments)    Possible lips swelling (dose 07/21/22) "Allergic," per facility   Doxycycline Other (See Comments)    "ALLERGIC," per facility document    HOME MEDICATIONS: Outpatient Medications Prior to Visit  Medication Sig Dispense Refill   acetaminophen  (TYLENOL) 500 MG tablet Take 2 tablets (1,000 mg total) by mouth every 8 (eight) hours as needed for mild pain, headache or moderate pain. 30 tablet 0   albuterol (PROVENTIL) (2.5 MG/3ML) 0.083% nebulizer solution Take 3 mLs (2.5 mg total) by nebulization every 4 (four) hours as needed for wheezing or shortness of breath. (Patient taking differently: Take 2.5 mg by nebulization in the morning and at bedtime.) 75 mL 12   aspirin 81 MG chewable tablet Chew 1 tablet (81 mg total) by mouth daily. (Patient taking differently: Chew 81 mg by mouth in the morning.)     atenolol (TENORMIN) 25 MG tablet Take 12.5 mg by mouth in the morning.     b complex vitamins capsule Take 1 capsule by mouth in the morning.     baclofen (LIORESAL) 10 MG tablet Take 10 mg by mouth See admin instructions. Take 10 mg by mouth in the morning and at 5 PM     CALCIUM ALGINATE EX Apply 1 application  topically See admin instructions. Calcium Alginate-Silver external pad 4"x 5". Apply to left ankle and foot topically every day shift every other day for wound healing.     doxazosin (CARDURA) 1 MG tablet Take 1 mg by mouth in the morning.     Emollient (AQUAPHOR OINTMENT BODY EX) Apply 1 application  topically See admin instructions. Apply to the lower legs and feet every shift     feeding supplement (ENSURE ENLIVE / ENSURE PLUS) LIQD Take 237 mLs by mouth 2 (two) times daily between meals. 237 mL 12   ferrous gluconate (FERGON) 324 MG tablet Take 324 mg by mouth daily with breakfast.     gabapentin (NEURONTIN) 100 MG capsule Take 200-300 mg by mouth See admin instructions. Take 200 mg by mouth in the morning, 300 mg at 5 PM, and 300 mg at bedtime     leptospermum manuka honey (MEDIHONEY) PSTE paste Apply 1 Application topically daily.     mirtazapine (REMERON) 7.5 MG tablet Take 7.5 mg by mouth at bedtime.     mupirocin ointment (BACTROBAN) 2 % Place 1 Application into the nose 2 (two) times daily. 22 g 0   mupirocin ointment  (BACTROBAN) 2 % Apply topically 2 (two) times daily. 22 g 0   NON FORMULARY Take 120 mLs by mouth See admin instructions. MedPass 2.0 - Drink 120 ml's by mouth three times a day     OLANZapine (ZYPREXA) 2.5 MG tablet Take 1 tablet (2.5 mg total) by mouth every morning. 30 tablet 2   OLANZapine zydis (ZYPREXA) 5 MG disintegrating tablet Take 1 tablet (5 mg total) by mouth at bedtime.     polyethylene glycol (MIRALAX / GLYCOLAX) 17 g packet Take 17 g by mouth daily as needed. (Patient taking differently: Take 17 g by mouth daily as needed (for constipation).) 14 each 0   rosuvastatin (CRESTOR) 20 MG tablet Take 1 tablet (20 mg total) by mouth daily. (Patient taking differently: Take 20 mg by mouth at bedtime.)     senna-docusate (SENOKOT-S) 8.6-50 MG tablet Take 1 tablet  by mouth at bedtime.     sodium chloride (OCEAN) 0.65 % SOLN nasal spray Place 1 spray into both nostrils as needed for congestion. 15 mL 0   triamcinolone cream (KENALOG) 0.1 % Apply 1 Application topically See admin instructions. Apply to rashes every day and evening shift- right arm, right thigh, left thigh, and any other affected areas     No facility-administered medications prior to visit.    PAST MEDICAL HISTORY: Past Medical History:  Diagnosis Date   Arthritis    Asthma    Cancer ([place])    Chronic alcohol abuse    Dyspnea    GERD (gastroesophageal reflux disease)    PRN  ---  TAKES BAKING SODA IN WATER   History of pneumothorax    04-28-2006  fell, left fx rib--  resolved w/ chest tube   Hypertension    Nocturia    Poor dental hygiene    Right inguinal hernia    Weak urinary stream     PAST SURGICAL HISTORY: Past Surgical History:  Procedure Laterality Date   COLONOSCOPY  01/08/2021   GOLD SEED IMPLANT N/A 05/23/2021   Procedure: GOLD SEED IMPLANT;  Surgeon: Ceasar Mons, MD;  Location: [facility];  Service: Urology;  Laterality: N/A;   INGUINAL HERNIA REPAIR Right 04/23/2017   Procedure: OPEN  RIGHT INGUINAL HERNIA REPAIR WITH MESH;  Surgeon: Kinsinger, Arta Bruce, MD;  Location: [facility];  Service: General;  Laterality: Right;  GENERAL COMBINED WITH REGIONAL FOR POST OP PAIN    INSERTION OF MESH Right 04/23/2017   Procedure: INSERTION OF MESH;  Surgeon: Kinsinger, Arta Bruce, MD;  Location: [facility];  Service: General;  Laterality: Right;  GENERAL COMBINED WITH REGIONAL FOR POST OP PAIN    LIPOMA EXCISION Right 02/22/2015   Procedure: RIGHT SHOULDER MASS EXCISION 15 CM SQ;  Surgeon: Mickeal Skinner, MD;  Location: [facility];  Service: General;  Laterality: Right;   SPACE OAR INSTILLATION N/A 05/23/2021   Procedure: SPACE OAR INSTILLATION;  Surgeon: Ceasar Mons, MD;  Location: [facility];  Service: Urology;  Laterality: N/A;   TRANSRECTAL ULTRASOUND N/A 05/23/2021   Procedure: TRANSRECTAL ULTRASOUND;  Surgeon: Ceasar Mons, MD;  Location: [facility];  Service: Urology;  Laterality: N/A;    FAMILY HISTORY: Family History  Problem Relation Age of Onset   Prostate cancer Cousin    Prostate cancer Cousin     SOCIAL HISTORY: Social History   Socioeconomic History   Marital status: Divorced    Spouse name: Not on file   Number of children: Not on file   Years of education: Not on file   Highest education level: Not on file  Occupational History   Not on file  Tobacco Use   Smoking status: Every Day    Packs/day: 0.50    Years: 50.00    Additional pack years: 0.00    Total pack years: 25.00    Types: Cigarettes   Smokeless tobacco: Never  Vaping Use   Vaping Use: Never used  Substance and Sexual Activity   Alcohol use: Not Currently    Comment: last use 2-3 months ago ,   Drug use: Not Currently    Types: Marijuana    Comment: last use 2 years ago per pt   Sexual activity: Not Currently  Other Topics Concern   Not on file  Social History Narrative   Not on file   Social Determinants of  Health   Financial Resource Strain: Not on file  Food Insecurity: No Food Insecurity (08/03/2022)   Hunger Vital Sign    Worried About Running Out of Food in the Last Year: Never true    Ran Out of Food in the Last Year: Never true  Transportation Needs: No Transportation Needs (08/03/2022)   PRAPARE - Hydrologist (Medical): No    Lack of Transportation (Non-Medical): No  Physical Activity: Not on file  Stress: Not on file  Social Connections: Not on file  Intimate Partner Violence: Not At Risk (08/03/2022)   Humiliation, Afraid, Rape, and Kick questionnaire    Fear of Current or Ex-Partner: No    Emotionally Abused: No    Physically Abused: No    Sexually Abused: No   PHYSICAL EXAM  Vitals:   08/29/22 1347  BP: (!) 163/88  Pulse: 88   There is no height or weight on file to calculate BMI.  Generalized: Frail malnourished looking elderly African-American male,  somewhat frail,  in no acute distress, in wheelchair, gross hematuria to foley bag  Neurological examination  Mentation: Alert, cooperative, diminished attention, registration and recall.  Oriented to time and person diminished recall.   Cranial nerve II-XII: Pupils were equal round reactive to light. Extraocular movements were full, left dense homonymous hemianopia, left facial asymmetry at the mouth, asymmetric left shoulder shrug  Motor: right arm and leg strength appears 4/5, left arm is spastic flexion of the wrist, fingers, pain with 4-5 finger PROM, left leg increased tone, very limited antigravity movement, pain to left upper medial thigh with tension.  6 lection contractures of the left wrist, fingers of the left ankle dorsiflexors Sensory: Sensory testing is intact to soft touch on all 4 extremities. No evidence of extinction is noted.  Coordination: cannot perform with the left arm and leg Gait and station: In a wheelchair, cannot ambulate Reflexes: Deep tendon reflexes are symmetric    DIAGNOSTIC DATA (LABS, IMAGING, TESTING) - I reviewed patient records, labs, notes, testing and imaging myself where available.  Lab Results  Component Value Date   WBC 6.2 08/04/2022   HGB 8.6 (L) 08/04/2022   HCT 27.6 (L) 08/04/2022   MCV 86.0 08/04/2022   PLT 326 08/04/2022      Component Value Date/Time   NA 139 08/05/2022 0706   K 3.8 08/05/2022 0706   CL 102 08/05/2022 0706   CO2 27 08/05/2022 0706   GLUCOSE 94 08/05/2022 0706   BUN 19 08/05/2022 0706   CREATININE 1.67 (H) 08/05/2022 0706   CALCIUM 8.3 (L) 08/05/2022 0706   PROT 7.2 07/31/2022 0349   ALBUMIN 2.3 (L) 07/31/2022 0349   AST 24 07/31/2022 0349   ALT 20 07/31/2022 0349   ALKPHOS 43 07/31/2022 0349   BILITOT 0.6 07/31/2022 0349   GFRNONAA 43 (L) 08/05/2022 0706   GFRAA >60 04/21/2017 0957   Lab Results  Component Value Date   CHOL 130 03/25/2022   HDL 29 (L) 03/25/2022   LDLCALC 84 03/25/2022   TRIG 83 03/25/2022   CHOLHDL 4.5 03/25/2022   Lab Results  Component Value Date   HGBA1C 6.0 (H) 03/25/2022   Lab Results  Component Value Date   [id number] 06/28/2022   Lab Results  Component Value Date   TSH 0.299 (L) 03/25/2022  ASSESSMENT AND PLAN : 72 year old gentleman with 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  Patient has not done well and is now total care and residing in a nursing home.  Vascular risk factors hypertension, hyperlipidemia remote tobacco abuse. Recent hospital visits in February 2024 for pneumonia, urinary tract infection, fever with renal insufficiency.   I had a long d/w patient and his transporter r from nursing home about his remote stroke, left spastic diplegic and peripheral vision loss risk for recurrent stroke/TIAs, personally independently reviewed imaging studies and stroke evaluation results and answered questions.Continue aspirin 81 mg daily  for secondary  stroke prevention and maintain strict control of hypertension with blood pressure goal below 130/90, diabetes with hemoglobin A1c goal below 6.5% and lipids with LDL cholesterol goal below 70 mg/dL. I also advised the patient to eat a healthy diet with plenty of whole grains, cereals, fruits and vegetables, exercise regularly and maintain ideal body weight .  No scheduled follow-up visit is necessary but can be referred back in the future as needed Greater than 50% time during the 35-minute visit spent in counseling and coordination of care about his stroke and residual deficits and answering questions Antony Contras, MD P 08/29/2022, 2:03 PM [facility] [address], [place], [place] [address] [phone]

## 2022-10-02 ENCOUNTER — Emergency Department (HOSPITAL_COMMUNITY): Payer: 59

## 2022-10-02 ENCOUNTER — Observation Stay (HOSPITAL_COMMUNITY)
Admission: EM | Admit: 2022-10-02 | Discharge: 2022-10-03 | Disposition: A | Discharge status: Skilled Nursing Facility | Payer: 59 | Attending: Internal Medicine | Admitting: Internal Medicine

## 2022-10-02 ENCOUNTER — Encounter (HOSPITAL_COMMUNITY): Payer: Self-pay | Admitting: Internal Medicine

## 2022-10-02 ENCOUNTER — Other Ambulatory Visit: Payer: Self-pay

## 2022-10-02 DIAGNOSIS — F0390 Unspecified dementia without behavioral disturbance: Secondary | ICD-10-CM | POA: Diagnosis not present

## 2022-10-02 DIAGNOSIS — Z66 Do not resuscitate: Secondary | ICD-10-CM | POA: Diagnosis not present

## 2022-10-02 DIAGNOSIS — Z7982 Long term (current) use of aspirin: Secondary | ICD-10-CM | POA: Insufficient documentation

## 2022-10-02 DIAGNOSIS — K625 Hemorrhage of anus and rectum: Principal | ICD-10-CM | POA: Insufficient documentation

## 2022-10-02 DIAGNOSIS — Z85828 Personal history of other malignant neoplasm of skin: Secondary | ICD-10-CM | POA: Insufficient documentation

## 2022-10-02 DIAGNOSIS — K922 Gastrointestinal hemorrhage, unspecified: Secondary | ICD-10-CM

## 2022-10-02 DIAGNOSIS — J45909 Unspecified asthma, uncomplicated: Secondary | ICD-10-CM | POA: Diagnosis not present

## 2022-10-02 DIAGNOSIS — B182 Chronic viral hepatitis C: Secondary | ICD-10-CM | POA: Diagnosis not present

## 2022-10-02 DIAGNOSIS — Z8673 Personal history of transient ischemic attack (TIA), and cerebral infarction without residual deficits: Secondary | ICD-10-CM

## 2022-10-02 DIAGNOSIS — Z79899 Other long term (current) drug therapy: Secondary | ICD-10-CM | POA: Diagnosis not present

## 2022-10-02 DIAGNOSIS — I10 Essential (primary) hypertension: Secondary | ICD-10-CM

## 2022-10-02 DIAGNOSIS — Z8546 Personal history of malignant neoplasm of prostate: Secondary | ICD-10-CM | POA: Diagnosis not present

## 2022-10-02 DIAGNOSIS — N1831 Chronic kidney disease, stage 3a: Secondary | ICD-10-CM | POA: Insufficient documentation

## 2022-10-02 DIAGNOSIS — N401 Enlarged prostate with lower urinary tract symptoms: Secondary | ICD-10-CM

## 2022-10-02 DIAGNOSIS — I129 Hypertensive chronic kidney disease with stage 1 through stage 4 chronic kidney disease, or unspecified chronic kidney disease: Secondary | ICD-10-CM | POA: Insufficient documentation

## 2022-10-02 DIAGNOSIS — R8271 Bacteriuria: Secondary | ICD-10-CM | POA: Insufficient documentation

## 2022-10-02 DIAGNOSIS — F17210 Nicotine dependence, cigarettes, uncomplicated: Secondary | ICD-10-CM | POA: Insufficient documentation

## 2022-10-02 DIAGNOSIS — B9689 Other specified bacterial agents as the cause of diseases classified elsewhere: Secondary | ICD-10-CM | POA: Diagnosis not present

## 2022-10-02 DIAGNOSIS — C61 Malignant neoplasm of prostate: Secondary | ICD-10-CM

## 2022-10-02 DIAGNOSIS — K5909 Other constipation: Secondary | ICD-10-CM | POA: Insufficient documentation

## 2022-10-02 DIAGNOSIS — Z720 Tobacco use: Secondary | ICD-10-CM

## 2022-10-02 DIAGNOSIS — R8281 Pyuria: Secondary | ICD-10-CM

## 2022-10-02 DIAGNOSIS — D649 Anemia, unspecified: Secondary | ICD-10-CM

## 2022-10-02 DIAGNOSIS — K921 Melena: Secondary | ICD-10-CM

## 2022-10-02 DIAGNOSIS — N138 Other obstructive and reflux uropathy: Secondary | ICD-10-CM

## 2022-10-02 LAB — URINALYSIS, ROUTINE W REFLEX MICROSCOPIC
Glucose, UA: NEGATIVE mg/dL
Ketones, ur: NEGATIVE mg/dL
Nitrite: POSITIVE — AB
Protein, ur: 100 mg/dL — AB
Specific Gravity, Urine: 1.02 (ref 1.005–1.030)
pH: 6.5 (ref 5.0–8.0)

## 2022-10-02 LAB — COMPREHENSIVE METABOLIC PANEL
ALT: 20 U/L (ref 0–44)
AST: 22 U/L (ref 15–41)
Albumin: 3 g/dL — ABNORMAL LOW (ref 3.5–5.0)
Alkaline Phosphatase: 47 U/L (ref 38–126)
Anion gap: 12 (ref 5–15)
BUN: 20 mg/dL (ref 8–23)
CO2: 24 mmol/L (ref 22–32)
Calcium: 9.7 mg/dL (ref 8.9–10.3)
Chloride: 101 mmol/L (ref 98–111)
Creatinine, Ser: 1.39 mg/dL — ABNORMAL HIGH (ref 0.61–1.24)
GFR, Estimated: 54 mL/min — ABNORMAL LOW (ref >60)
Glucose, Bld: 102 mg/dL — ABNORMAL HIGH (ref 70–99)
Potassium: 4.2 mmol/L (ref 3.5–5.1)
Sodium: 137 mmol/L (ref 135–145)
Total Bilirubin: 0.5 mg/dL (ref 0.3–1.2)
Total Protein: 7.8 g/dL (ref 6.5–8.1)

## 2022-10-02 LAB — CBC
HCT: 27 % — ABNORMAL LOW (ref 39.0–52.0)
Hemoglobin: 8.7 g/dL — ABNORMAL LOW (ref 13.0–17.0)
MCH: 28.7 pg (ref 26.0–34.0)
MCHC: 32.2 g/dL (ref 30.0–36.0)
MCV: 89.1 fL (ref 80.0–100.0)
Platelets: 214 10*3/uL (ref 150–400)
RBC: 3.03 MIL/uL — ABNORMAL LOW (ref 4.22–5.81)
RDW: 18.6 % — ABNORMAL HIGH (ref 11.5–15.5)
WBC: 4.2 10*3/uL (ref 4.0–10.5)
nRBC: 0 % (ref 0.0–0.2)

## 2022-10-02 LAB — CBC WITH DIFFERENTIAL/PLATELET
Abs Immature Granulocytes: 0.01 10*3/uL (ref 0.00–0.07)
Basophils Absolute: 0 10*3/uL (ref 0.0–0.1)
Basophils Relative: 1 %
Eosinophils Absolute: 0.2 10*3/uL (ref 0.0–0.5)
Eosinophils Relative: 5 %
HCT: 29.2 % — ABNORMAL LOW (ref 39.0–52.0)
Hemoglobin: 9.4 g/dL — ABNORMAL LOW (ref 13.0–17.0)
Immature Granulocytes: 0 %
Lymphocytes Relative: 27 %
Lymphs Abs: 1.2 10*3/uL (ref 0.7–4.0)
MCH: 28.9 pg (ref 26.0–34.0)
MCHC: 32.2 g/dL (ref 30.0–36.0)
MCV: 89.8 fL (ref 80.0–100.0)
Monocytes Absolute: 0.5 10*3/uL (ref 0.1–1.0)
Monocytes Relative: 12 %
Neutro Abs: 2.3 10*3/uL (ref 1.7–7.7)
Neutrophils Relative %: 55 %
Platelets: 218 10*3/uL (ref 150–400)
RBC: 3.25 MIL/uL — ABNORMAL LOW (ref 4.22–5.81)
RDW: 18.5 % — ABNORMAL HIGH (ref 11.5–15.5)
WBC: 4.2 10*3/uL (ref 4.0–10.5)
nRBC: 0 % (ref 0.0–0.2)

## 2022-10-02 LAB — HEMOGLOBIN AND HEMATOCRIT, BLOOD
HCT: 27.1 % — ABNORMAL LOW (ref 39.0–52.0)
Hemoglobin: 8.7 g/dL — ABNORMAL LOW (ref 13.0–17.0)

## 2022-10-02 LAB — URINALYSIS, MICROSCOPIC (REFLEX): RBC / HPF: >50 RBC/hpf (ref 0–5)

## 2022-10-02 LAB — PROTIME-INR
INR: 1 (ref 0.8–1.2)
Prothrombin Time: 13.5 seconds (ref 11.4–15.2)

## 2022-10-02 LAB — TYPE AND SCREEN
ABO/RH(D): A POS
Antibody Screen: NEGATIVE

## 2022-10-02 MED ORDER — OLANZAPINE 2.5 MG PO TABS
2.5000 mg | ORAL_TABLET | Freq: Every morning | ORAL | Status: DC
  Administered 2022-10-03: 2.5 mg via ORAL
  Filled 2022-10-02: qty 1

## 2022-10-02 MED ORDER — SODIUM CHLORIDE 0.9% FLUSH
3.0000 mL | Freq: Two times a day (BID) | INTRAVENOUS | Status: DC
  Administered 2022-10-02 – 2022-10-03 (×2): 3 mL via INTRAVENOUS

## 2022-10-02 MED ORDER — IOHEXOL 350 MG/ML SOLN
80.0000 mL | Freq: Once | INTRAVENOUS | Status: AC | PRN
  Administered 2022-10-02: 80 mL via INTRAVENOUS

## 2022-10-02 MED ORDER — BACLOFEN 10 MG PO TABS
10.0000 mg | ORAL_TABLET | Freq: Two times a day (BID) | ORAL | Status: DC
  Administered 2022-10-02 – 2022-10-03 (×2): 10 mg via ORAL
  Filled 2022-10-02 (×2): qty 1

## 2022-10-02 MED ORDER — MIRTAZAPINE 15 MG PO TABS
7.5000 mg | ORAL_TABLET | Freq: Every day | ORAL | Status: DC
  Administered 2022-10-02: 7.5 mg via ORAL
  Filled 2022-10-02: qty 1

## 2022-10-02 MED ORDER — ACETAMINOPHEN 325 MG PO TABS: 650.0000 mg | ORAL_TABLET | Freq: Four times a day (QID) | ORAL | Status: DC | PRN

## 2022-10-02 MED ORDER — DOXAZOSIN MESYLATE 1 MG PO TABS
1.0000 mg | ORAL_TABLET | Freq: Every morning | ORAL | Status: DC
  Administered 2022-10-03: 1 mg via ORAL
  Filled 2022-10-02: qty 1

## 2022-10-02 MED ORDER — OLANZAPINE 5 MG PO TBDP
5.0000 mg | ORAL_TABLET | Freq: Every day | ORAL | Status: DC
  Administered 2022-10-02: 5 mg via ORAL
  Filled 2022-10-02 (×2): qty 1

## 2022-10-02 MED ORDER — ROSUVASTATIN CALCIUM 20 MG PO TABS
20.0000 mg | ORAL_TABLET | Freq: Every day | ORAL | Status: DC
  Administered 2022-10-02: 20 mg via ORAL
  Filled 2022-10-02: qty 1

## 2022-10-02 MED ORDER — GABAPENTIN 300 MG PO CAPS
300.0000 mg | ORAL_CAPSULE | Freq: Two times a day (BID) | ORAL | Status: DC
  Administered 2022-10-02 – 2022-10-03 (×2): 300 mg via ORAL
  Filled 2022-10-02 (×2): qty 1

## 2022-10-02 MED ORDER — ALBUTEROL SULFATE (2.5 MG/3ML) 0.083% IN NEBU: 2.5000 mg | INHALATION_SOLUTION | RESPIRATORY_TRACT | Status: DC | PRN

## 2022-10-02 MED ORDER — GABAPENTIN 100 MG PO CAPS: 200.0000 mg | ORAL_CAPSULE | ORAL | Status: DC

## 2022-10-02 MED ORDER — HYDROMORPHONE HCL 1 MG/ML IJ SOLN
0.5000 mg | Freq: Once | INTRAMUSCULAR | Status: AC
  Administered 2022-10-02: 0.5 mg via INTRAVENOUS
  Filled 2022-10-02: qty 1

## 2022-10-02 MED ORDER — GABAPENTIN 100 MG PO CAPS
200.0000 mg | ORAL_CAPSULE | Freq: Every morning | ORAL | Status: DC
  Administered 2022-10-03: 200 mg via ORAL
  Filled 2022-10-02: qty 2

## 2022-10-02 MED ORDER — LEVOFLOXACIN IN D5W 500 MG/100ML IV SOLN
500.0000 mg | Freq: Once | INTRAVENOUS | Status: AC
  Administered 2022-10-02: 500 mg via INTRAVENOUS
  Filled 2022-10-02 (×2): qty 100

## 2022-10-02 MED ORDER — ACETAMINOPHEN 650 MG RE SUPP: 650.0000 mg | Freq: Four times a day (QID) | RECTAL | Status: DC | PRN

## 2022-10-02 NOTE — H&P (Signed)
History and Physical   Jason Jones OZH:[id number] DOB: [birth year] DOA: 10/02/2022  PCP: [name], NP   Patient coming from: Nursing facility  Chief Complaint: GI bleed  HPI: Jason Jones is a 72 y.o. male with medical history significant of stroke, BPH, chronic Foley, anemia, hypertension, chronic hepatitis C, diverticulosis, prostate cancer, GERD, alcohol use, dementia presenting with rectal bleeding.  Patient was at facility today and noted to have blood in his briefs.  Has prior history of similar episode requiring transfusion.  Did not have colonoscopy done during that admission and was planned to be done as an outpatient but has not yet been done prior to this episode.   Patient has dementia but is alert and aware of events today, however was not aware of some details of recent admission.  ED Course: Vital signs in the ED notable for blood pressure in the 101 50s systolic.  Lab workup showed CMP with creatinine stable 1.39, glucose 22, albumin 3.  Review of Systems: As per HPI otherwise all other systems reviewed and are negative.  Past Medical History:  Diagnosis Date   Acute ischemic right PCA stroke 03/25/2022   Arthritis    Asthma    Cancer    CAP (community acquired pneumonia) 07/20/2022   Chronic alcohol abuse    Dyspnea    GERD (gastroesophageal reflux disease)    PRN  ---  TAKES BAKING SODA IN WATER   History of pneumothorax    04-28-2006  fell, left fx rib--  resolved w/ chest tube   Hypertension    Nocturia    Poor dental hygiene    Right inguinal hernia    Weak urinary stream     Past Surgical History:  Procedure Laterality Date   COLONOSCOPY  01/08/2021   GOLD SEED IMPLANT N/A 05/23/2021   Procedure: GOLD SEED IMPLANT;  Surgeon: Rene Paci, MD;  Location: [facility];  Service: Urology;  Laterality: N/A;   INGUINAL HERNIA REPAIR Right 04/23/2017   Procedure: OPEN RIGHT INGUINAL HERNIA REPAIR WITH MESH;  Surgeon: Kinsinger, De Blanch,  MD;  Location: [facility];  Service: General;  Laterality: Right;  GENERAL COMBINED WITH REGIONAL FOR POST OP PAIN    INSERTION OF MESH Right 04/23/2017   Procedure: INSERTION OF MESH;  Surgeon: Kinsinger, De Blanch, MD;  Location: [facility] [place];  Service: General;  Laterality: Right;  GENERAL COMBINED WITH REGIONAL FOR POST OP PAIN    LIPOMA EXCISION Right 02/22/2015   Procedure: RIGHT SHOULDER MASS EXCISION 15 CM SQ;  Surgeon: Rodman Pickle, MD;  Location: [facility];  Service: General;  Laterality: Right;   SPACE OAR INSTILLATION N/A 05/23/2021   Procedure: SPACE OAR INSTILLATION;  Surgeon: Rene Paci, MD;  Location: [facility];  Service: Urology;  Laterality: N/A;   TRANSRECTAL ULTRASOUND N/A 05/23/2021   Procedure: TRANSRECTAL ULTRASOUND;  Surgeon: Rene Paci, MD;  Location: [facility];  Service: Urology;  Laterality: N/A;    Social History  reports that he has been smoking cigarettes. He has a 25.00 pack-year smoking history. He has never used smokeless tobacco. He reports that he does not currently use alcohol. He reports that he does not currently use drugs after having used the following drugs: Marijuana.  Allergies  Allergen Reactions   Ceftriaxone Swelling and Other (See Comments)    Possible lips swelling (dose 07/21/22) "Allergic," per facility   Doxycycline Other (See Comments)    "ALLERGIC," per facility document  Family History  Problem Relation Age of Onset   Prostate cancer Cousin    Prostate cancer Cousin   Reviewed on admission  Prior to Admission medications   Medication Sig Start Date End Date Taking? Authorizing Provider  acetaminophen (TYLENOL) 500 MG tablet Take 2 tablets (1,000 mg total) by mouth every 8 (eight) hours as needed for mild pain, headache or moderate pain. 08/05/22   Alwyn Ren, MD  albuterol (PROVENTIL) (2.5 MG/3ML) 0.083% nebulizer solution Take 3 mLs (2.5 mg  total) by nebulization every 4 (four) hours as needed for wheezing or shortness of breath. Patient taking differently: Take 2.5 mg by nebulization in the morning and at bedtime. 03/28/22   Marrianne Mood, MD  aspirin 81 MG chewable tablet Chew 1 tablet (81 mg total) by mouth daily. Patient taking differently: Chew 81 mg by mouth in the morning. 03/29/22   Marrianne Mood, MD  atenolol (TENORMIN) 25 MG tablet Take 12.5 mg by mouth in the morning.    [provider]  b complex vitamins capsule Take 1 capsule by mouth in the morning.    [provider]  baclofen (LIORESAL) 10 MG tablet Take 10 mg by mouth See admin instructions. Take 10 mg by mouth in the morning and at 5 PM    [provider]  CALCIUM ALGINATE EX Apply 1 application  topically See admin instructions. Calcium Alginate-Silver external pad 4"x 5". Apply to left ankle and foot topically every day shift every other day for wound healing.    [provider]  doxazosin (CARDURA) 1 MG tablet Take 1 mg by mouth in the morning.    [provider]  Emollient (AQUAPHOR OINTMENT BODY EX) Apply 1 application  topically See admin instructions. Apply to the lower legs and feet every shift    [provider]  feeding supplement (ENSURE ENLIVE / ENSURE PLUS) LIQD Take 237 mLs by mouth 2 (two) times daily between meals. 03/28/22   Marrianne Mood, MD  ferrous gluconate (FERGON) 324 MG tablet Take 324 mg by mouth daily with breakfast. 07/02/22 08/31/22  [provider]  gabapentin (NEURONTIN) 100 MG capsule Take 200-300 mg by mouth See admin instructions. Take 200 mg by mouth in the morning, 300 mg at 5 PM, and 300 mg at bedtime    [provider]  leptospermum manuka honey (MEDIHONEY) PSTE paste Apply 1 Application topically daily. 07/24/22   Calvert Cantor, MD  mirtazapine (REMERON) 7.5 MG tablet Take 7.5 mg by mouth at bedtime.    [provider]  mupirocin ointment  (BACTROBAN) 2 % Place 1 Application into the nose 2 (two) times daily. 08/05/22   Alwyn Ren, MD  mupirocin ointment (BACTROBAN) 2 % Apply topically 2 (two) times daily. 08/05/22   Alwyn Ren, MD  NON FORMULARY Take 120 mLs by mouth See admin instructions. MedPass 2.0 - Drink 120 ml's by mouth three times a day    [provider]  OLANZapine (ZYPREXA) 2.5 MG tablet Take 1 tablet (2.5 mg total) by mouth every morning. 08/05/22 08/05/23  Alwyn Ren, MD  OLANZapine zydis (ZYPREXA) 5 MG disintegrating tablet Take 1 tablet (5 mg total) by mouth at bedtime. 08/05/22   Alwyn Ren, MD  polyethylene glycol (MIRALAX / GLYCOLAX) 17 g packet Take 17 g by mouth daily as needed. Patient taking differently: Take 17 g by mouth daily as needed (for constipation). 04/16/22   Kathlen Mody, MD  rosuvastatin (CRESTOR) 20 MG tablet Take  1 tablet (20 mg total) by mouth daily. Patient taking differently: Take 20 mg by mouth at bedtime. 03/29/22   Marrianne Mood, MD  senna-docusate (SENOKOT-S) 8.6-50 MG tablet Take 1 tablet by mouth at bedtime.    [provider]  sodium chloride (OCEAN) 0.65 % SOLN nasal spray Place 1 spray into both nostrils as needed for congestion. 04/16/22   Kathlen Mody, MD  triamcinolone cream (KENALOG) 0.1 % Apply 1 Application topically See admin instructions. Apply to rashes every day and evening shift- right arm, right thigh, left thigh, and any other affected areas    [provider]    Physical Exam: Vitals:   10/02/22 1230 10/02/22 1315 10/02/22 1416 10/02/22 1445  BP: 133/85 (!) 136/102  113/69  Pulse: 69 85  62  Resp: Temp:   98.1 F (36.7 C)   TempSrc:   Oral   SpO2: 97% 96%  95%  Weight:      Height:        Physical Exam Constitutional:      General: He is not in acute distress.    Appearance: Normal appearance.  HENT:     Head: Normocephalic and atraumatic.     Mouth/Throat:     Mouth: Mucous  membranes are moist.     Pharynx: Oropharynx is clear.  Eyes:     Extraocular Movements: Extraocular movements intact.     Pupils: Pupils are equal, round, and reactive to light.  Cardiovascular:     Rate and Rhythm: Normal rate and regular rhythm.     Pulses: Normal pulses.     Heart sounds: Normal heart sounds.  Pulmonary:     Effort: Pulmonary effort is normal. No respiratory distress.     Breath sounds: Normal breath sounds.  Abdominal:     General: Bowel sounds are normal. There is no distension.     Palpations: Abdomen is soft.     Tenderness: There is no abdominal tenderness.  Musculoskeletal:        General: No swelling or deformity.     Comments: Chronic LUE and LLE contracture  Skin:    General: Skin is warm and dry.  Neurological:     General: No focal deficit present.     Mental Status: Mental status is at baseline.    Labs on Admission: I have personally reviewed following labs and imaging studies  CBC: Recent Labs  Lab 10/02/22 0539 10/02/22 1359  WBC 4.2  --   NEUTROABS 2.3  --   HGB 9.4* 8.7*  HCT 29.2* 27.1*  MCV 89.8  --   PLT 218  --     Basic Metabolic Panel: Recent Labs  Lab 10/02/22 0539  NA 137  K 4.2  CL 101  CO2 24  GLUCOSE 102*  BUN 20  CREATININE 1.39*  CALCIUM 9.7    GFR: Estimated Creatinine Clearance: 41.9 mL/min (A) (by C-G formula based on SCr of 1.39 mg/dL (H)).  Liver Function Tests: Recent Labs  Lab 10/02/22 0539  AST 22  ALT 20  ALKPHOS 47  BILITOT 0.5  PROT 7.8  ALBUMIN 3.0*    Urine analysis:    Component Value Date/Time   COLORURINE YELLOW 10/02/2022 0617   APPEARANCEUR CLOUDY (A) 10/02/2022 0617   LABSPEC 1.020 10/02/2022 0617   PHURINE 6.5 10/02/2022 0617   GLUCOSEU NEGATIVE 10/02/2022 0617   HGBUR LARGE (A) 10/02/2022 0617   BILIRUBINUR SMALL (A) 10/02/2022 0617   KETONESUR NEGATIVE 10/02/2022  0617   PROTEINUR 100 (A) 10/02/2022 0617   UROBILINOGEN 0.2 03/16/2008 0733   NITRITE POSITIVE (A)  10/02/2022 0617   LEUKOCYTESUR MODERATE (A) 10/02/2022 0617    Radiological Exams on Admission: CT ANGIO GI BLEED  Result Date: 10/02/2022 CLINICAL DATA:  Episode of dark red stool. History of prostate cancer. EXAM: CTA ABDOMEN AND PELVIS WITHOUT AND WITH CONTRAST TECHNIQUE: Multidetector CT imaging of the abdomen and pelvis was performed using the standard protocol during bolus administration of intravenous contrast. Multiplanar reconstructed images and MIPs were obtained and reviewed to evaluate the vascular anatomy. RADIATION DOSE REDUCTION: This exam was performed according to the departmental dose-optimization program which includes automated exposure control, adjustment of the mA and/or kV according to patient size and/or use of iterative reconstruction technique. CONTRAST:  80mL OMNIPAQUE IOHEXOL 350 MG/ML SOLN COMPARISON:  CT abdomen/pelvis 04/12/2022. FINDINGS: VASCULAR Aorta: There is mild mixed plaque in the nonaneurysmal abdominal aorta. There is no evidence of aneurysm, dissection, vasculitis, or significant stenosis. Celiac: Patent without evidence of aneurysm, dissection, vasculitis or significant stenosis. SMA: Patent without evidence of aneurysm, dissection, vasculitis or significant stenosis. Renals: Both renal arteries are patent without evidence of aneurysm, dissection, vasculitis, fibromuscular dysplasia or significant stenosis. IMA: Patent without evidence of aneurysm, dissection, vasculitis or significant stenosis. Inflow: Patent with mild plaque but no evidence of aneurysm, dissection, vasculitis or significant stenosis. Proximal Outflow: There is moderate to severe stenosis of the proximal superficial femoral artery on the left (5-118). Otherwise, the bilateral common femoral and other visualized portions of the superficial and profunda femoral arteries are patent without evidence of aneurysm, dissection, vasculitis or significant stenosis. Veins: The main portal and splenic veins are  patent. Other: There is no evidence of active GI bleed. Review of the MIP images confirms the above findings. NON-VASCULAR Lower chest: Incompletely imaged linear opacities in the left base are favored to reflect atelectasis or scar. The imaged heart is unremarkable. Hepatobiliary: The liver and gallbladder are unremarkable. There is no biliary ductal dilatation. Pancreas: Unremarkable. Spleen: Unremarkable. Adrenals/Urinary Tract: The adrenals are unremarkable. Subcentimeter hypodense lesions in both kidneys are too small to characterize but most likely reflect benign cysts requiring no specific imaging follow-up. There are no suspicious lesions. There is no hydronephrosis or hydroureter. There are no stones in either kidney or along the course of either ureter. The bladder is decompressed by a Foley catheter. There is marked bladder wall thickening likely reflecting a history of outlet obstruction. Stomach/Bowel: The stomach is unremarkable. There is no evidence of bowel obstruction. There is no abnormal bowel wall thickening or inflammatory change. The appendix is normal. Lymphatic: There is no abdominal or pelvic lymphadenopathy. Reproductive: The prostate is enlarged. Surgical clips are again noted in the prostate. Other: There is no ascites or free air. Musculoskeletal: There is no acute osseous abnormality or suspicious osseous lesion. IMPRESSION: 1. No evidence of active GI bleed or other acute pathology in the abdomen or pelvis. 2. 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 Otherwise overall mild atherosclerotic disease. 3. Marked bladder wall thickening likely reflecting a history of outlet obstruction. The bladder is decompressed by a Foley catheter. Electronically Signed   By: Lesia Hausen M.D.   On: 10/02/2022 08:23    EKG: Not performed in the ED  Assessment/Plan Principal Problem:   GI bleed Active Problems:   Normocytic anemia   Prostate cancer    Primary hypertension   History of CVA (cerebrovascular accident)  BPH with urinary obstruction   Chronic hepatitis C   GI bleeding > Patient presenting from facility after blood noted in his brief.  Blood noted in the ED in the brief as well.  History of diverticulosis. > Hemoglobin initially stable at 9.4 but on repeat they noted they had decreased to 8.7. > Previous admission with similar requiring transfusion but no colonoscopy has extended to being planned outpatient but has not yet been done. > GI consulted in the ED.  Patient has been typed and screened. - Per GI recommendations - Monitor on telemetry - SCDs - Clear liquid diet, thickened - Trend CBC  History of stroke > Left side contracture and dysphagia residual - Holding home aspirin in the setting of GI bleed - Honey thickened liquids, previously on dysphagia diet, currently clear liquids/thickened  Dementia - Continue home Zyprexa and mirtazapine  Hypertension - Holding home atenolol in the setting of GI bleeding and currently normal BP  BPH Chronic indwelling Foley Bacteriuria/pyuria > UA in the ED showed hemoglobin, bilirubin, protein, nitrates, leukocytes, few bacteria.  Urine culture pending. > Does havethickened bladder but this is decompressed with Foley in place on CT. No leukocytosis, nor local or systemic symptoms.   > Did receive a dose of Levaquin in the ED, allergies to ceftriaxone and doxycycline. > Previously has been positive for MRSA and completed a course of linezolid. - Hold off on further antibiotics for now with chronic indwelling Foley. - May benefit from Foley exchange.  Social* - Follow-up urine cultures - If symptoms develop/high volume on culture consider linezolid or other as indicated  History of prostate cancer Chronic hepatitis C - Noted  DVT prophylaxis: SCDs Code Status:   DNR/DNI, Confirmed (daughter requested to discuss with him) Family Communication:  Daughter updated by  phone Disposition Plan:   Patient is from:  SNF  Anticipated DC to:  Same as above  Anticipated DC date:  1 to 3 days  Anticipated DC barriers: None  Consults called:  Gastroenterology consulted in the ED Admission status:  Observation, telemetry  Severity of Illness: The appropriate patient status for this patient is OBSERVATION. Observation status is judged to be reasonable and necessary in order to provide the required intensity of service to ensure the patient's safety. The patient's presenting symptoms, physical exam findings, and initial radiographic and laboratory data in the context of their medical condition is felt to place them at decreased risk for further clinical deterioration. Furthermore, it is anticipated that the patient will be medically stable for discharge from the hospital within 2 midnights of admission.    Synetta Fail MD [facility]  How to contact the [facility] Attending or Consulting provider 7A - 7P or covering provider during after hours 7P -7A, for this patient?   Check the care team in [facility] and look for a) attending/consulting [facility] provider listed and b) the [facility] team listed Log into [email or web address] and use [place]'s universal password to access. If you do not have the password, please contact the hospital operator. Locate the [facility] provider you are looking for under [facility] and page to a number that you can be directly reached. If you still have difficulty reaching the provider, please page the [facility] (Director on Call) for the Hospitalists listed on amion for assistance.  10/02/2022, 3:19 PM   and [email or web address] him

## 2022-10-02 NOTE — ED Notes (Signed)
ED TO INPATIENT HANDOFF REPORT  ED Nurse Name and Phone #: [name] Name/Age/Gender Jason Jones 72 y.o. male Room/Bed: 037C/037C  Code Status   Code Status: DNR  Home/SNF/Other Home Patient oriented to: self, place, and time Is this baseline? Yes   Triage Complete: Triage complete  Chief Complaint GI bleed [K92.2]  Triage Note Pt arrives to ED c/o an episode of dark red stool in brief. No blood thinners reported. Pt with hx of stroke and left sided deficients    Allergies Allergies  Allergen Reactions   Ceftriaxone Swelling and Other (See Comments)    Possible lips swelling (dose 07/21/22) "Allergic," per facility   Doxycycline Other (See Comments)    "ALLERGIC," per facility document    Level of Care/Admitting Diagnosis ED Disposition     ED Disposition  Admit   Condition  --   Comment  Hospital Area: [facility] [[id number]]  Level of Care: Telemetry Medical [104]  May place patient in observation at [facility] or [facility] if equivalent level of care is available:: No  Covid Evaluation: Asymptomatic - no recent exposure (last 10 days) testing not required  Diagnosis: GI bleed [409811]  Admitting Physician: Synetta Fail [[id number]]  Attending Physician: Synetta Fail [[id number]]          B Medical/Surgery History Past Medical History:  Diagnosis Date   Acute ischemic right PCA stroke 03/25/2022   Arthritis    Asthma    Cancer    CAP (community acquired pneumonia) 07/20/2022   Chronic alcohol abuse    Dyspnea    GERD (gastroesophageal reflux disease)    PRN  ---  TAKES BAKING SODA IN WATER   History of pneumothorax    04-28-2006  fell, left fx rib--  resolved w/ chest tube   Hypertension    Nocturia    Poor dental hygiene    Right inguinal hernia    Weak urinary stream    Past Surgical History:  Procedure Laterality Date   COLONOSCOPY  01/08/2021   GOLD SEED IMPLANT N/A 05/23/2021   Procedure: GOLD SEED  IMPLANT;  Surgeon: Rene Paci, MD;  Location: [facility];  Service: Urology;  Laterality: N/A;   INGUINAL HERNIA REPAIR Right 04/23/2017   Procedure: OPEN RIGHT INGUINAL HERNIA REPAIR WITH MESH;  Surgeon: Kinsinger, De Blanch, MD;  Location[facility];  Service: General;  Laterality: Right;  GENERAL COMBINED WITH REGIONAL FOR POST OP PAIN    INSERTION OF MESH Right 04/23/2017   Procedure: INSERTION OF MESH;  Surgeon: Kinsinger, De Blanch, MD;  Location: [facility] ;  Service: General;  Laterality: Right;  GENERAL COMBINED WITH REGIONAL FOR POST OP PAIN    LIPOMA EXCISION Right 02/22/2015   Procedure: RIGHT SHOULDER MASS EXCISION 15 CM SQ;  Surgeon: Rodman Pickle, MD;  Location: [facility];  Service: General;  Laterality: Right;   SPACE OAR INSTILLATION N/A 05/23/2021   Procedure: SPACE OAR INSTILLATION;  Surgeon: Rene Paci, MD;  Location: [facility];  Service: Urology;  Laterality: N/A;   TRANSRECTAL ULTRASOUND N/A 05/23/2021   Procedure: TRANSRECTAL ULTRASOUND;  Surgeon: Rene Paci, MD;  Location: [facility];  Service: Urology;  Laterality: N/A;     A IV Location/Drains/Wounds Patient Lines/Drains/Airways Status     Active Line/Drains/Airways     Name Placement date Placement time Site Days   Peripheral IV 10/02/22 20 G Left;Posterior Forearm 10/02/22  0533  Forearm  less than 1   Peripheral IV 10/02/22 20 G 1" Posterior;Right Forearm 10/02/22  0534  Forearm  less than 1   Urethral Catheter Morrie Sheldon, RN 16 Fr. 07/31/22  1022  --  63   Pressure Injury 07/20/22 Ankle Anterior;Left Unstageable - Full thickness tissue loss in which the base of the injury is covered by slough (yellow, tan, gray, green or brown) and/or eschar (tan, brown or black) in the wound bed. 07/20/22  1809  -- 73   Pressure Injury 07/20/22 Foot Anterior;Left Stage 3 -  Full thickness tissue loss. Subcutaneous fat may be visible but  bone, tendon or muscle are NOT exposed. 07/20/22  1810  -- 73            Intake/Output Last 24 hours No intake or output data in the 24 hours ending 10/02/22 1653  Labs/Imaging Results for orders placed or performed during the hospital encounter of 10/02/22 (from the past 48 hour(s))  Type and screen     Status: None   Collection Time: 10/02/22  5:30 AM  Result Value Ref Range   ABO/RH(D) A POS    Antibody Screen NEG    Sample Expiration      10/05/2022,2359 Performed at [facility], [address] [place], [place] [address]   CBC with Differential     Status: Abnormal   Collection Time: 10/02/22  5:39 AM  Result Value Ref Range   WBC 4.2 4.0 - 10.5 K/uL   RBC 3.25 (L) 4.22 - 5.81 MIL/uL   Hemoglobin 9.4 (L) 13.0 - 17.0 g/dL   HCT 60.4 (L) 54.0 - 98.1 %   MCV 89.8 80.0 - 100.0 fL   MCH 28.9 26.0 - 34.0 pg   MCHC 32.2 30.0 - 36.0 g/dL   RDW 19.1 (H) 47.8 - 29.5 %   Platelets 218 150 - 400 K/uL   nRBC 0.0 0.0 - 0.2 %   Neutrophils Relative % 55 %   Neutro Abs 2.3 1.7 - 7.7 K/uL   Lymphocytes Relative 27 %   Lymphs Abs 1.2 0.7 - 4.0 K/uL   Monocytes Relative 12 %   Monocytes Absolute 0.5 0.1 - 1.0 K/uL   Eosinophils Relative 5 %   Eosinophils Absolute 0.2 0.0 - 0.5 K/uL   Basophils Relative 1 %   Basophils Absolute 0.0 0.0 - 0.1 K/uL   Immature Granulocytes 0 %   Abs Immature Granulocytes 0.01 0.00 - 0.07 K/uL    Comment: Performed at [facility], [address], [place], [place] [address]  Comprehensive metabolic panel     Status: Abnormal   Collection Time: 10/02/22  5:39 AM  Result Value Ref Range   Sodium 137 135 - 145 mmol/L   Potassium 4.2 3.5 - 5.1 mmol/L   Chloride 101 98 - 111 mmol/L   CO2 24 22 - 32 mmol/L   Glucose, Bld 102 (H) 70 - 99 mg/dL    Comment: Glucose reference range applies only to samples taken after fasting for at least 8 hours.   BUN 20 8 - 23 mg/dL   Creatinine, Ser 8.65 (H) 0.61 - 1.24 mg/dL   Calcium 9.7 8.9 - 78.4  mg/dL   Total Protein 7.8 6.5 - 8.1 g/dL   Albumin 3.0 (L) 3.5 - 5.0 g/dL   AST 22 15 - 41 U/L   ALT 20 0 - 44 U/L   Alkaline Phosphatase 47 38 - 126 U/L   Total Bilirubin 0.5 0.3 -  1.2 mg/dL   GFR, Estimated 54 (L) >60 mL/min    Comment: (NOTE) Calculated using the CKD-EPI Creatinine Equation (2021)    Anion gap 12 5 - 15    Comment: Performed at [facility], [address] [place], [place] [address]  Protime-INR     Status: None   Collection Time: 10/02/22  5:39 AM  Result Value Ref Range   Prothrombin Time 13.5 11.4 - 15.2 seconds   INR 1.0 0.8 - 1.2    Comment: (NOTE) INR goal varies based on device and disease states. Performed at [facility], [address], [place], [place] [address]   Urinalysis, Routine w reflex microscopic -Urine, Catheterized; Indwelling urinary catheter     Status: Abnormal   Collection Time: 10/02/22  6:17 AM  Result Value Ref Range   Color, Urine YELLOW YELLOW   APPearance CLOUDY (A) CLEAR   Specific Gravity, Urine 1.020 1.005 - 1.030   pH 6.5 5.0 - 8.0   Glucose, UA NEGATIVE NEGATIVE mg/dL   Hgb urine dipstick LARGE (A) NEGATIVE   Bilirubin Urine SMALL (A) NEGATIVE   Ketones, ur NEGATIVE NEGATIVE mg/dL   Protein, ur 098 (A) NEGATIVE mg/dL   Nitrite POSITIVE (A) NEGATIVE   Leukocytes,Ua MODERATE (A) NEGATIVE    Comment: Performed at [facility], [address] [place], [place] [address]  Urinalysis, Microscopic (reflex)     Status: Abnormal   Collection Time: 10/02/22  6:17 AM  Result Value Ref Range   RBC / HPF >50 0 - 5 RBC/hpf   WBC, UA 0-5 0 - 5 WBC/hpf   Bacteria, UA RARE (A) NONE SEEN   Squamous Epithelial / HPF 0-5 0 - 5 /HPF    Comment: Performed at [facility], [address] [place], [place] [address]  Hemoglobin and hematocrit, blood     Status: Abnormal   Collection Time: 10/02/22  1:59 PM  Result Value Ref Range   Hemoglobin 8.7 (L) 13.0 - 17.0 g/dL   HCT 62.1 (L) 30.8 - 65.7 %    Comment:  Performed at [facility], [address] [place], [place] [address]   CT ANGIO GI BLEED  Result Date: 10/02/2022 CLINICAL DATA:  Episode of dark red stool. History of prostate cancer. EXAM: CTA ABDOMEN AND PELVIS WITHOUT AND WITH CONTRAST TECHNIQUE: Multidetector CT imaging of the abdomen and pelvis was performed using the standard protocol during bolus administration of intravenous contrast. Multiplanar reconstructed images and MIPs were obtained and reviewed to evaluate the vascular anatomy. RADIATION DOSE REDUCTION: This exam was performed according to the departmental dose-optimization program which includes automated exposure control, adjustment of the mA and/or kV according to patient size and/or use of iterative reconstruction technique. CONTRAST:  80mL OMNIPAQUE IOHEXOL 350 MG/ML SOLN COMPARISON:  CT abdomen/pelvis 04/12/2022. FINDINGS: VASCULAR Aorta: There is mild mixed plaque in the nonaneurysmal abdominal aorta. There is no evidence of aneurysm, dissection, vasculitis, or significant stenosis. Celiac: Patent without evidence of aneurysm, dissection, vasculitis or significant stenosis. SMA: Patent without evidence of aneurysm, dissection, vasculitis or significant stenosis. Renals: Both renal arteries are patent without evidence of aneurysm, dissection, vasculitis, fibromuscular dysplasia or significant stenosis. IMA: Patent without evidence of aneurysm, dissection, vasculitis or significant stenosis. Inflow: Patent with mild plaque but no evidence of aneurysm, dissection, vasculitis or significant stenosis. Proximal Outflow: There is moderate to severe stenosis of the proximal superficial femoral artery on the left (5-118). Otherwise, the bilateral common femoral  and other visualized portions of the superficial and profunda femoral arteries are patent without evidence of aneurysm, dissection, vasculitis or significant stenosis. Veins: The main portal and splenic veins are patent. Other: There  is no evidence of active GI bleed. Review of the MIP images confirms the above findings. NON-VASCULAR Lower chest: Incompletely imaged linear opacities in the left base are favored to reflect atelectasis or scar. The imaged heart is unremarkable. Hepatobiliary: The liver and gallbladder are unremarkable. There is no biliary ductal dilatation. Pancreas: Unremarkable. Spleen: Unremarkable. Adrenals/Urinary Tract: The adrenals are unremarkable. Subcentimeter hypodense lesions in both kidneys are too small to characterize but most likely reflect benign cysts requiring no specific imaging follow-up. There are no suspicious lesions. There is no hydronephrosis or hydroureter. There are no stones in either kidney or along the course of either ureter. The bladder is decompressed by a Foley catheter. There is marked bladder wall thickening likely reflecting a history of outlet obstruction. Stomach/Bowel: The stomach is unremarkable. There is no evidence of bowel obstruction. There is no abnormal bowel wall thickening or inflammatory change. The appendix is normal. Lymphatic: There is no abdominal or pelvic lymphadenopathy. Reproductive: The prostate is enlarged. Surgical clips are again noted in the prostate. Other: There is no ascites or free air. Musculoskeletal: There is no acute osseous abnormality or suspicious osseous lesion. IMPRESSION: 1. No evidence of active GI bleed or other acute pathology in the abdomen or pelvis. 2. 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 Otherwise overall mild atherosclerotic disease. 3. Marked bladder wall thickening likely reflecting a history of outlet obstruction. The bladder is decompressed by a Foley catheter. Electronically Signed   By: Lesia Hausen M.D.   On: 10/02/2022 08:23    Pending Labs Unresulted Labs (From admission, onward)     Start     Ordered   10/03/22 0500  Basic metabolic panel  Tomorrow morning,   R        10/02/22 1518    10/02/22 2200  CBC  Now then every 8 hours,   R (with TIMED occurrences)      10/02/22 1518   10/02/22 0718  Urine Culture  Add-on,   AD       Question:  Indication  Answer:  Dysuria   10/02/22 0717            Vitals/Pain Today's Vitals   10/02/22 1230 10/02/22 1315 10/02/22 1416 10/02/22 1445  BP: 133/85 (!) 136/102  113/69  Pulse: 69 85  62  Resp: Temp:   98.1 F (36.7 C)   TempSrc:   Oral   SpO2: 97% 96%  95%  Weight:      Height:      PainSc:        Isolation Precautions No active isolations  Medications Medications  rosuvastatin (CRESTOR) tablet 20 mg (has no administration in time range)  doxazosin (CARDURA) tablet 1 mg (has no administration in time range)  OLANZapine (ZYPREXA) tablet 2.5 mg (has no administration in time range)  OLANZapine zydis (ZYPREXA) disintegrating tablet 5 mg (has no administration in time range)  mirtazapine (REMERON) tablet 7.5 mg (has no administration in time range)  albuterol (PROVENTIL) (2.5 MG/3ML) 0.083% nebulizer solution 2.5 mg (has no administration in time range)  sodium chloride flush (NS) 0.9 % injection 3 mL (3 mLs Intravenous Not Given 10/02/22 1521)  acetaminophen (TYLENOL) tablet 650 mg (has no administration in time range)  Or  acetaminophen (TYLENOL) suppository 650 mg (has no administration in time range)  baclofen (LIORESAL) tablet 10 mg (has no administration in time range)  gabapentin (NEURONTIN) capsule 200-300 mg (has no administration in time range)  levofloxacin (LEVAQUIN) IVPB 500 mg (0 mg Intravenous Stopped 10/02/22 1042)  iohexol (OMNIPAQUE) 350 MG/ML injection 80 mL (80 mLs Intravenous Contrast Given 10/02/22 0804)  HYDROmorphone (DILAUDID) injection 0.5 mg (0.5 mg Intravenous Given 10/02/22 1412)    Mobility walks with device     Focused Assessments Cardiac Assessment Handoff:    Lab Results  Component Value Date   CKTOTAL 1,712 (H) 03/26/2022   No results found for:  "DDIMER" Does the Patient currently have chest pain? No    R Recommendations: See Admitting Provider Note  Report given to:   Additional Notes:

## 2022-10-02 NOTE — Consult Note (Signed)
Reason for Consult: Rectal bleeding for 3 days. Referring Physician: [name].  Jason Jones is an 72 y.o. male.  HPI: [name] is a 72 year old black male with multiple medical problems listed below who presents to the emergency room at at [facility] today with history of rectal bleeding that he has had for the last 3 days.  He gives a history of chronic constipation and averages 1 BM every 2 to 3 days and tends to strain to facilitate BM's.  He denies having any abdominal pain, nausea or vomiting.Marland KitchenHe had a colonoscopy by Dr. Jeani Jones and April 2022 when he was noted to have diverticula in the transverse colon a small tubular adenoma was removed from the transverse colon as well.  On review of the notes in the emergency room patient was seen here last year for similar complaints but never followed up with Dr. Elnoria Jones as he was supposed to.  He has been living at [facility] since he had a stroke in November 2023.  He is bedbound and of late has not been eating and drinking well.  Patient was discharged from the hospital on 07/24/2022 after he was admitted for pneumonia and was treated with Levaquin.  A CTA done earlier today showed no evidence of acute bleeding; he was noted to have moderate to severe stenosis of proximal superficial femoral artery on the left with mild atherosclerotic disease and marked bladder wall thickening reflecting a history of outlet obstruction the bladder was decompressed with a Foley in place the prostate seemed enlarged and surgical clips were noted in the prostate.  Hemoglobin in the ER was 9.4 g/dL this morning and 8.7 g/dL this afternoon.  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    Arthritis     Asthma     Cancer     CAP (community acquired pneumonia) 07/20/2022   Chronic alcohol abuse     Dyspnea     GERD (gastroesophageal reflux disease)      PRN  ---  TAKES BAKING SODA IN WATER   History of pneumothorax       04-28-2006  fell, left fx rib--  resolved w/ chest tube   Hypertension     Nocturia     Poor dental hygiene     Right inguinal hernia     Weak urinary stream             Past Surgical History    Past Surgical History:  Procedure Laterality Date   COLONOSCOPY  01/08/2021   GOLD SEED IMPLANT N/A 05/23/2021   Procedure: GOLD SEED IMPLANT;  Surgeon: Rene Paci, MD;  Location: [facility];  Service: Urology;  Laterality: N/A;   INGUINAL HERNIA REPAIR Right 04/23/2017   Procedure: OPEN RIGHT INGUINAL HERNIA REPAIR WITH MESH;  Surgeon: Kinsinger, De Blanch, MD;  Location: [facility];  Service: General;  Laterality: Right;  GENERAL COMBINED WITH REGIONAL FOR POST OP PAIN    INSERTION OF MESH Right 04/23/2017   Procedure: INSERTION OF MESH;  Surgeon: Kinsinger, De Blanch, MD;  Location: [facility];  Service: General;  Laterality: Right;  GENERAL COMBINED WITH REGIONAL FOR POST OP PAIN    LIPOMA EXCISION Right 02/22/2015   Procedure: RIGHT SHOULDER MASS EXCISION 15 CM SQ;  Surgeon: Rodman Pickle, MD;  Location: [facility];  Service: General;  Laterality: Right;   SPACE OAR INSTILLATION N/A 05/23/2021   Procedure: SPACE OAR INSTILLATION;  Surgeon: Liliane Shi,  Dorian Furnace, MD;  Location: [facility];  Service: Urology;  Laterality: N/A;   TRANSRECTAL ULTRASOUND N/A 05/23/2021   Procedure: TRANSRECTAL ULTRASOUND;  Surgeon: Rene Paci, MD;  Location: [facility];  Service: Urology;  Laterality: N/A;   Family History  Problem Relation Age of Onset   Prostate cancer Cousin    Prostate cancer Cousin    Social History:  reports that he has been smoking cigarettes. He has a 25.00 pack-year smoking history. He has never used smokeless tobacco. He reports that he does not currently use alcohol. He reports that he does not currently use drugs after having used the following drugs: Marijuana.  Allergies:  Allergies  Allergen  Reactions   Ceftriaxone Swelling and Other (See Comments)    Possible lips swelling (dose 07/21/22) "Allergic," per facility   Doxycycline Other (See Comments)    "ALLERGIC," per facility document   Medications: I have reviewed the patient's current medications. Prior to Admission: (Not in a hospital admission)  Scheduled:  baclofen  10 mg Oral BID   [START ON 10/03/2022] doxazosin  1 mg Oral q AM   [START ON 10/03/2022] gabapentin  200 mg Oral q AM   gabapentin  300 mg Oral BID   mirtazapine  7.5 mg Oral QHS   [START ON 10/03/2022] OLANZapine  2.5 mg Oral q morning   OLANZapine zydis  5 mg Oral QHS   rosuvastatin  20 mg Oral QHS   sodium chloride flush  3 mL Intravenous Q12H   Continuous: QMV:[id number] **OR** acetaminophen, albuterol  Results for orders placed or performed during the hospital encounter of 10/02/22 (from the past 48 hour(s))  Type and screen     Status: None   Collection Time: 10/02/22  5:30 AM  Result Value Ref Range   ABO/RH(D) A POS    Antibody Screen NEG    Sample Expiration      10/05/2022,2359 Performed at [facility], [address], [place], [place] [address]   CBC with Differential     Status: Abnormal   Collection Time: 10/02/22  5:39 AM  Result Value Ref Range   WBC 4.2 4.0 - 10.5 K/uL   RBC 3.25 (L) 4.22 - 5.81 MIL/uL   Hemoglobin 9.4 (L) 13.0 - 17.0 g/dL   HCT 10.2 (L) 72.5 - 36.6 %   MCV 89.8 80.0 - 100.0 fL   MCH 28.9 26.0 - 34.0 pg   MCHC 32.2 30.0 - 36.0 g/dL   RDW 44.0 (H) 34.7 - 42.5 %   Platelets 218 150 - 400 K/uL   nRBC 0.0 0.0 - 0.2 %   Neutrophils Relative % 55 %   Neutro Abs 2.3 1.7 - 7.7 K/uL   Lymphocytes Relative 27 %   Lymphs Abs 1.2 0.7 - 4.0 K/uL   Monocytes Relative 12 %   Monocytes Absolute 0.5 0.1 - 1.0 K/uL   Eosinophils Relative 5 %   Eosinophils Absolute 0.2 0.0 - 0.5 K/uL   Basophils Relative 1 %   Basophils Absolute 0.0 0.0 - 0.1 K/uL   Immature Granulocytes 0 %   Abs Immature Granulocytes 0.01 0.00  - 0.07 K/uL    Comment: Performed at [facility], [address], [place], [place] [address]  Comprehensive metabolic panel     Status: Abnormal   Collection Time: 10/02/22  5:39 AM  Result Value Ref Range   Sodium 137 135 - 145 mmol/L   Potassium 4.2 3.5 - 5.1 mmol/L  Chloride 101 98 - 111 mmol/L   CO2 24 22 - 32 mmol/L   Glucose, Bld 102 (H) 70 - 99 mg/dL    Comment: Glucose reference range applies only to samples taken after fasting for at least 8 hours.   BUN 20 8 - 23 mg/dL   Creatinine, Ser 1.61 (H) 0.61 - 1.24 mg/dL   Calcium 9.7 8.9 - 09.6 mg/dL   Total Protein 7.8 6.5 - 8.1 g/dL   Albumin 3.0 (L) 3.5 - 5.0 g/dL   AST 22 15 - 41 U/L   ALT 20 0 - 44 U/L   Alkaline Phosphatase 47 38 - 126 U/L   Total Bilirubin 0.5 0.3 - 1.2 mg/dL   GFR, Estimated 54 (L) >60 mL/min    Comment: (NOTE) Calculated using the CKD-EPI Creatinine Equation (2021)    Anion gap 12 5 - 15    Comment: Performed at [facility], [address] [place], [place] [address]  Protime-INR     Status: None   Collection Time: 10/02/22  5:39 AM  Result Value Ref Range   Prothrombin Time 13.5 11.4 - 15.2 seconds   INR 1.0 0.8 - 1.2    Comment: (NOTE) INR goal varies based on device and disease states. Performed at [facility], [address], [place], [place] [address]   Urinalysis, Routine w reflex microscopic -Urine, Catheterized; Indwelling urinary catheter     Status: Abnormal   Collection Time: 10/02/22  6:17 AM  Result Value Ref Range   Color, Urine YELLOW YELLOW   APPearance CLOUDY (A) CLEAR   Specific Gravity, Urine 1.020 1.005 - 1.030   pH 6.5 5.0 - 8.0   Glucose, UA NEGATIVE NEGATIVE mg/dL   Hgb urine dipstick LARGE (A) NEGATIVE   Bilirubin Urine SMALL (A) NEGATIVE   Ketones, ur NEGATIVE NEGATIVE mg/dL   Protein, ur 147 (A) NEGATIVE mg/dL   Nitrite POSITIVE (A) NEGATIVE   Leukocytes,Ua MODERATE (A) NEGATIVE    Comment: Performed at [facility], [address] [place], [place] [address]  Urinalysis, Microscopic (reflex)     Status: Abnormal   Collection Time: 10/02/22  6:17 AM  Result Value Ref Range   RBC / HPF >50 0 - 5 RBC/hpf   WBC, UA 0-5 0 - 5 WBC/hpf   Bacteria, UA RARE (A) NONE SEEN   Squamous Epithelial / HPF 0-5 0 - 5 /HPF    Comment: Performed at [facility], [address], [place], [place] [address]  Hemoglobin and hematocrit, blood     Status: Abnormal   Collection Time: 10/02/22  1:59 PM  Result Value Ref Range   Hemoglobin 8.7 (L) 13.0 - 17.0 g/dL   HCT 65.7 (L) 84.6 - 96.2 %    Comment: Performed at [facility], [address], [place], [place] [address]    CT ANGIO GI BLEED  Result Date: 10/02/2022 CLINICAL DATA:  Episode of dark red stool. History of prostate cancer. EXAM: CTA ABDOMEN AND PELVIS WITHOUT AND WITH CONTRAST TECHNIQUE: Multidetector CT imaging of the abdomen and pelvis was performed using the standard protocol during bolus administration of intravenous contrast. Multiplanar reconstructed images and MIPs were obtained and reviewed to evaluate the vascular anatomy. RADIATION DOSE REDUCTION: This exam was performed according to the departmental dose-optimization program which includes automated exposure control, adjustment of the mA and/or kV according to patient size and/or use of iterative reconstruction technique. CONTRAST:  80mL OMNIPAQUE IOHEXOL  350 MG/ML SOLN COMPARISON:  CT abdomen/pelvis 04/12/2022. FINDINGS: VASCULAR Aorta: There is mild mixed plaque in the nonaneurysmal abdominal aorta. There is no evidence of aneurysm, dissection, vasculitis, or significant stenosis. Celiac: Patent without evidence of aneurysm, dissection, vasculitis or significant stenosis. SMA: Patent without evidence of aneurysm, dissection, vasculitis or significant stenosis. Renals: Both renal arteries are patent without evidence of aneurysm, dissection, vasculitis, fibromuscular dysplasia or significant stenosis. IMA: Patent  without evidence of aneurysm, dissection, vasculitis or significant stenosis. Inflow: Patent with mild plaque but no evidence of aneurysm, dissection, vasculitis or significant stenosis. Proximal Outflow: There is moderate to severe stenosis of the proximal superficial femoral artery on the left (5-118). Otherwise, the bilateral common femoral and other visualized portions of the superficial and profunda femoral arteries are patent without evidence of aneurysm, dissection, vasculitis or significant stenosis. Veins: The main portal and splenic veins are patent. Other: There is no evidence of active GI bleed. Review of the MIP images confirms the above findings. NON-VASCULAR Lower chest: Incompletely imaged linear opacities in the left base are favored to reflect atelectasis or scar. The imaged heart is unremarkable. Hepatobiliary: The liver and gallbladder are unremarkable. There is no biliary ductal dilatation. Pancreas: Unremarkable. Spleen: Unremarkable. Adrenals/Urinary Tract: The adrenals are unremarkable. Subcentimeter hypodense lesions in both kidneys are too small to characterize but most likely reflect benign cysts requiring no specific imaging follow-up. There are no suspicious lesions. There is no hydronephrosis or hydroureter. There are no stones in either kidney or along the course of either ureter. The bladder is decompressed by a Foley catheter. There is marked bladder wall thickening likely reflecting a history of outlet obstruction. Stomach/Bowel: The stomach is unremarkable. There is no evidence of bowel obstruction. There is no abnormal bowel wall thickening or inflammatory change. The appendix is normal. Lymphatic: There is no abdominal or pelvic lymphadenopathy. Reproductive: The prostate is enlarged. Surgical clips are again noted in the prostate. Other: There is no ascites or free air. Musculoskeletal: There is no acute osseous abnormality or suspicious osseous lesion. IMPRESSION: 1. No  evidence of active GI bleed or other acute pathology in the abdomen or pelvis. 2. 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 Otherwise overall mild atherosclerotic disease. 3. Marked bladder wall thickening likely reflecting a history of outlet obstruction. The bladder is decompressed by a Foley catheter. Electronically Signed   By: Lesia Hausen M.D.   On: 10/02/2022 08:23    Review of Systems  Constitutional:  Positive for activity change and fatigue. Negative for appetite change, chills, diaphoresis, fever and unexpected weight change.  HENT: Negative.    Eyes: Negative.   Respiratory: Negative.    Cardiovascular: Negative.   Gastrointestinal:  Positive for anal bleeding, blood in stool and constipation. Negative for abdominal distention, abdominal pain, nausea, rectal pain and vomiting.  Endocrine: Negative.   Genitourinary:  Positive for difficulty urinating.  Musculoskeletal:  Positive for arthralgias.  Allergic/Immunologic: Negative.   Neurological:  Positive for dizziness, weakness and light-headedness.  Psychiatric/Behavioral: Negative.     Blood pressure 113/69, pulse 62, temperature 98.1 F (36.7 C), temperature source Oral, resp. rate 15, height  (1.803 m), weight 61.7 kg, SpO2 95 %. Physical Exam Constitutional:      General: He is not in acute distress.    Appearance: He is ill-appearing.  HENT:     Head: Normocephalic and atraumatic.     Mouth/Throat:     Mouth: Mucous membranes are dry.  Eyes:  Extraocular Movements: Extraocular movements intact.     Pupils: Pupils are equal, round, and reactive to light.  Cardiovascular:     Rate and Rhythm: Normal rate and regular rhythm.  Abdominal:     General: There is no distension.     Palpations: Abdomen is soft.     Tenderness: There is no abdominal tenderness. There is no guarding.  Musculoskeletal:     Cervical back: Neck supple.  Skin:    General: Skin is warm and dry.   Neurological:     Mental Status: He is oriented to person, place, and time.  Psychiatric:        Mood and Affect: Mood normal.        Behavior: Behavior normal.        Thought Content: Thought content normal.        Judgment: Judgment normal.   Assessment/Plan: 1) Rectal bleeding with posthemorrhagic anemia-CTA done today shows no signs of acute bleeding.  Will monitor his CBC closely I do not think he is a candidate for a colonoscopy due to his physical limitations. 2) Chronic constipation. 3) Dysphagia is after left-sided stroke with contractures. 4) HTN. 5) Dementia. 6) BPH/history of prostate cancer-pyuria-indwelling Foley-Levaquin given in the ER. 7) Chronic hepatitis C. 8) DNR/DNI.  Charna Elizabeth 10/02/2022, 4:13 PM

## 2022-10-02 NOTE — ED Provider Notes (Signed)
Patient signed out to me by previous provider. Please refer to their note for full HPI.  Briefly this is a 72 year old male who presents with bright red blood per rectum.  History of previous painless hematochezia where hemoglobin dropped requiring transfusion.  Has outpatient follow-up with gastroenterology but no recent colonoscopy.  Patient signed out pending GI CT angio scan and GI consult.  CT scan shows no acute finding.  On my physical exam patient continues to have bright red blood per rectum.  Vitals are stable.  Repeat H&H ordered, patient is not anticoagulated.  Given the recurrent nature and ongoing status of rectal bleeding with previous requirement of transfusion will plan for admission and inpatient gastroenterology consult.  Patient had previous relationship with Dr. Elnoria Howard, Dr. Loreta Ave who is on-call is aware and will consult.  Patients evaluation and results requires admission for further treatment and care.  Spoke with hospitalist, reviewed patient's ED course and they accept admission.  Patient agrees with admission plan, offers no new complaints and is stable/unchanged at time of admit.   Rozelle Logan, DO 10/02/22 1433

## 2022-10-02 NOTE — ED Notes (Signed)
Leodis Rains (Daughter) called asking for a update. Her number is [phone].

## 2022-10-02 NOTE — ED Provider Notes (Signed)
[place] EMERGENCY DEPARTMENT AT [facility] Provider Note   CSN: [id number] Arrival date & time: 10/02/22  7846  History  Chief Complaint  Patient presents with   Rectal Bleeding    Jason Jones is a 72 y.o. male.  72 yo M from a facility for rectal bleeding. 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 Reportedly had BRB in his brief this evening. otherwise feels fine. Had been hospitalized in the past for Small GI bleed/anema/pneumonia.    Rectal Bleeding      Home Medications Prior to Admission medications   Medication Sig Start Date End Date Taking? Authorizing Provider  acetaminophen (TYLENOL) 500 MG tablet Take 2 tablets (1,000 mg total) by mouth every 8 (eight) hours as needed for mild pain, headache or moderate pain. 08/05/22   Alwyn Ren, MD  albuterol (PROVENTIL) (2.5 MG/3ML) 0.083% nebulizer solution Take 3 mLs (2.5 mg total) by nebulization every 4 (four) hours as needed for wheezing or shortness of breath. Patient taking differently: Take 2.5 mg by nebulization in the morning and at bedtime. 03/28/22   Marrianne Mood, MD  aspirin 81 MG chewable tablet Chew 1 tablet (81 mg total) by mouth daily. Patient taking differently: Chew 81 mg by mouth in the morning. 03/29/22   Marrianne Mood, MD  atenolol (TENORMIN) 25 MG tablet Take 12.5 mg by mouth in the morning.    [provider]  b complex vitamins capsule Take 1 capsule by mouth in the morning.    [provider]  baclofen (LIORESAL) 10 MG tablet Take 10 mg by mouth See admin instructions. Take 10 mg by mouth in the morning and at 5 PM    [provider]  CALCIUM ALGINATE EX Apply 1 application  topically See admin instructions. Calcium Alginate-Silver external pad 4"x  5". Apply to left ankle and foot topically every day shift every other day for wound healing.    [provider]  doxazosin (CARDURA) 1 MG tablet Take 1 mg by mouth in the morning.    [provider]  Emollient (AQUAPHOR OINTMENT BODY EX) Apply 1 application  topically See admin instructions. Apply to the lower legs and feet every shift    [provider]  feeding supplement (ENSURE ENLIVE / ENSURE PLUS) LIQD Take 237 mLs by mouth 2 (two) times daily between meals. 03/28/22   Marrianne Mood, MD  ferrous gluconate (FERGON) 324 MG tablet Take 324 mg by mouth daily with breakfast. 07/02/22 08/31/22  [provider]  gabapentin (NEURONTIN) 100 MG capsule Take 200-300 mg by mouth See admin instructions. Take 200 mg by mouth in the morning, 300 mg at 5 PM, and 300 mg at bedtime    [provider]  leptospermum manuka honey (MEDIHONEY) PSTE paste Apply 1 Application topically daily. 07/24/22   Calvert Cantor, MD  mirtazapine (REMERON) 7.5 MG tablet Take 7.5 mg by mouth at bedtime.    [provider]  mupirocin ointment (BACTROBAN) 2 % Place 1 Application into the nose 2 (two) times daily. 08/05/22   Alwyn Ren, MD  mupirocin ointment (BACTROBAN) 2 % Apply topically 2 (two) times daily. 08/05/22   Alwyn Ren, MD  NON FORMULARY Take 120 mLs by mouth See admin instructions. MedPass 2.0 - Drink 120 ml's by mouth three times a day    [provider]  OLANZapine (ZYPREXA) 2.5 MG tablet Take 1 tablet (2.5 mg total) by mouth every morning. 08/05/22 08/05/23  Alwyn Ren, MD  OLANZapine zydis (ZYPREXA) 5 MG disintegrating tablet Take 1 tablet (5 mg total) by mouth at bedtime. 08/05/22   Alwyn Ren, MD  polyethylene glycol (MIRALAX / GLYCOLAX) 17 g packet Take 17 g by mouth daily as needed. Patient taking differently: Take 17 g by mouth daily as needed (for constipation). 04/16/22   Kathlen Mody, MD  rosuvastatin (CRESTOR)  20 MG tablet Take 1 tablet (20 mg total) by mouth daily. Patient taking differently: Take 20 mg by mouth at bedtime. 03/29/22   Marrianne Mood, MD  senna-docusate (SENOKOT-S) 8.6-50 MG tablet Take 1 tablet by mouth at bedtime.    [provider]  sodium chloride (OCEAN) 0.65 % SOLN nasal spray Place 1 spray into both nostrils as needed for congestion. 04/16/22   Kathlen Mody, MD  triamcinolone cream (KENALOG) 0.1 % Apply 1 Application topically See admin instructions. Apply to rashes every day and evening shift- right arm, right thigh, left thigh, and any other affected areas    [provider]      Allergies    Ceftriaxone and Doxycycline    Review of Systems   Review of Systems  Gastrointestinal:  Positive for hematochezia.    Physical Exam Updated Vital Signs BP (!) 140/68   Pulse 75   Temp 98.1 F (36.7 C) (Oral)   Resp 16   Ht  (1.803 m)   Wt 61.7 kg   SpO2 97%   BMI 18.97 kg/m  Physical Exam Vitals and nursing note reviewed.  Constitutional:      Appearance: He is well-developed.  HENT:     Head: Normocephalic and atraumatic.  Eyes:     Pupils: Pupils are equal, round, and reactive to light.  Cardiovascular:     Rate and Rhythm: Normal rate.  Pulmonary:     Effort: Pulmonary effort is normal. No respiratory distress.  Abdominal:     General: Abdomen is flat. There is no distension.     Tenderness: There is no abdominal tenderness.     Comments: Bright red blood mixed with dark stool in his depends.   Musculoskeletal:        General: Normal range of motion.     Cervical back: Normal range of motion.  Skin:    General: Skin is warm and dry.  Neurological:     General: No focal deficit present.     Mental Status: He is alert.     ED Results / Procedures / Treatments   Labs (all labs ordered are listed, but only abnormal results are displayed) Labs Reviewed  CBC WITH DIFFERENTIAL/PLATELET - Abnormal; Notable for the following  components:      Result Value   RBC 3.25 (*)    Hemoglobin 9.4 (*)    HCT 29.2 (*)    RDW 18.5 (*)    All other components within normal limits  COMPREHENSIVE METABOLIC PANEL - Abnormal; Notable for the following components:   Glucose, Bld 102 (*)    Creatinine, Ser 1.39 (*)    Albumin 3.0 (*)    GFR, Estimated 54 (*)    All other components within normal limits  URINALYSIS, ROUTINE W REFLEX MICROSCOPIC - Abnormal; Notable for the following components:   APPearance CLOUDY (*)    Hgb urine dipstick LARGE (*)    Bilirubin Urine SMALL (*)    Protein,  ur 100 (*)    Nitrite POSITIVE (*)    Leukocytes,Ua MODERATE (*)    All other components within normal limits  URINALYSIS, MICROSCOPIC (REFLEX) - Abnormal; Notable for the following components:   Bacteria, UA RARE (*)    All other components within normal limits  URINE CULTURE  PROTIME-INR  TYPE AND SCREEN    EKG None  Radiology No results found.  Procedures Procedures    Medications Ordered in ED Medications  levofloxacin (LEVAQUIN) IVPB 500 mg (has no administration in time range)    ED Course/ Medical Decision Making/ A&P                             Medical Decision Making Amount and/or Complexity of Data Reviewed Labs: ordered. Radiology: ordered.  Will work up for acute gi bleed. Vitally stable, no indication for emergent transfusion.  Labs ok. Looks like UTI, will treat. D/w pharmacy will use 500 levaquin until cultures return.   Final Clinical Impression(s) / ED Diagnoses Final diagnoses:  None    Rx / DC Orders ED Discharge Orders     None         Traeton Bordas, Barbara Cower, MD 10/02/22 [phone]

## 2022-10-02 NOTE — ED Triage Notes (Signed)
Pt arrives to ED c/o an episode of dark red stool in brief. No blood thinners reported. Pt with hx of stroke and left sided deficients

## 2022-10-03 DIAGNOSIS — K625 Hemorrhage of anus and rectum: Principal | ICD-10-CM

## 2022-10-03 DIAGNOSIS — I739 Peripheral vascular disease, unspecified: Secondary | ICD-10-CM | POA: Diagnosis not present

## 2022-10-03 DIAGNOSIS — I10 Essential (primary) hypertension: Secondary | ICD-10-CM | POA: Diagnosis not present

## 2022-10-03 DIAGNOSIS — B182 Chronic viral hepatitis C: Secondary | ICD-10-CM | POA: Diagnosis not present

## 2022-10-03 LAB — CBC
HCT: 28.7 % — ABNORMAL LOW (ref 39.0–52.0)
HCT: 29.2 % — ABNORMAL LOW (ref 39.0–52.0)
Hemoglobin: 9.4 g/dL — ABNORMAL LOW (ref 13.0–17.0)
Hemoglobin: 9.5 g/dL — ABNORMAL LOW (ref 13.0–17.0)
MCH: 28.7 pg (ref 26.0–34.0)
MCH: 29.4 pg (ref 26.0–34.0)
MCHC: 32.8 g/dL (ref 30.0–36.0)
MCHC: 32.5 g/dL (ref 30.0–36.0)
MCV: 87.5 fL (ref 80.0–100.0)
MCV: 90.4 fL (ref 80.0–100.0)
Platelets: 231 10*3/uL (ref 150–400)
Platelets: 210 10*3/uL (ref 150–400)
RBC: 3.28 MIL/uL — ABNORMAL LOW (ref 4.22–5.81)
RBC: 3.23 MIL/uL — ABNORMAL LOW (ref 4.22–5.81)
RDW: 18.4 % — ABNORMAL HIGH (ref 11.5–15.5)
RDW: 18.5 % — ABNORMAL HIGH (ref 11.5–15.5)
WBC: 4.8 10*3/uL (ref 4.0–10.5)
WBC: 4.4 10*3/uL (ref 4.0–10.5)
nRBC: 0 % (ref 0.0–0.2)
nRBC: 0 % (ref 0.0–0.2)

## 2022-10-03 LAB — BASIC METABOLIC PANEL
Anion gap: 12 (ref 5–15)
BUN: 19 mg/dL (ref 8–23)
CO2: 26 mmol/L (ref 22–32)
Calcium: 9.9 mg/dL (ref 8.9–10.3)
Chloride: 101 mmol/L (ref 98–111)
Creatinine, Ser: 1.33 mg/dL — ABNORMAL HIGH (ref 0.61–1.24)
GFR, Estimated: 57 mL/min — ABNORMAL LOW (ref >60)
Glucose, Bld: 85 mg/dL (ref 70–99)
Potassium: 3.7 mmol/L (ref 3.5–5.1)
Sodium: 139 mmol/L (ref 135–145)

## 2022-10-03 MED ORDER — CHLORHEXIDINE GLUCONATE CLOTH 2 % EX PADS
6.0000 | MEDICATED_PAD | Freq: Every day | CUTANEOUS | Status: DC
  Administered 2022-10-03: 6 via TOPICAL

## 2022-10-03 NOTE — Progress Notes (Signed)
Subjective: No further bleeding.  Objective: Vital signs in last 24 hours: Temp:  [98 F (36.7 C)-98.7 F (37.1 C)] 98.7 F (37.1 C) (04/25 0855) Pulse Rate:  [55-88] 88 (04/25 0855) Resp:  [15-17] 16 (04/25 0855) BP: (114-138)/(54-84) 132/84 (04/25 0855) SpO2:  [93 %-100 %] 93 % (04/25 0855) Last BM Date : 10/02/22  Intake/Output from previous day: 04/24 0701 - 04/25 0700 In: -  Out: 1700 [Urine:1700] Intake/Output this shift: Total I/O In: -  Out: 1100 [Urine:1100]  General appearance: alert and no distress GI: soft, non-tender; bowel sounds normal; no masses,  no organomegaly  Lab Results: Recent Labs    10/02/22 0539 10/02/22 1359 10/02/22 2230 10/03/22 0652  WBC 4.2  --  4.2 4.8  HGB 9.4* 8.7* 8.7* 9.4*  HCT 29.2* 27.1* 27.0* 28.7*  PLT 218  --  214 231   BMET Recent Labs    10/02/22 0539 10/03/22 0652  NA 137 139  K 4.2 3.7  CL 101 101  CO2 24 26  GLUCOSE 102* 85  BUN 20 19  CREATININE 1.39* 1.33*  CALCIUM 9.7 9.9   LFT Recent Labs    10/02/22 0539  PROT 7.8  ALBUMIN 3.0*  AST 22  ALT 20  ALKPHOS 47  BILITOT 0.5   PT/INR Recent Labs    10/02/22 0539  LABPROT 13.5  INR 1.0   Hepatitis Panel No results for input(s): "HEPBSAG", "HCVAB", "HEPAIGM", "HEPBIGM" in the last 72 hours. C-Diff No results for input(s): "CDIFFTOX" in the last 72 hours. Fecal Lactopherrin No results for input(s): "FECLLACTOFRN" in the last 72 hours.  Studies/Results: CT ANGIO GI BLEED  Result Date: 10/02/2022 CLINICAL DATA:  Episode of dark red stool. History of prostate cancer. EXAM: CTA ABDOMEN AND PELVIS WITHOUT AND WITH CONTRAST TECHNIQUE: Multidetector CT imaging of the abdomen and pelvis was performed using the standard protocol during bolus administration of intravenous contrast. Multiplanar reconstructed images and MIPs were obtained and reviewed to evaluate the vascular anatomy. RADIATION DOSE REDUCTION: This exam was performed according to the  departmental dose-optimization program which includes automated exposure control, adjustment of the mA and/or kV according to patient size and/or use of iterative reconstruction technique. CONTRAST:  80mL OMNIPAQUE IOHEXOL 350 MG/ML SOLN COMPARISON:  CT abdomen/pelvis 04/12/2022. FINDINGS: VASCULAR Aorta: There is mild mixed plaque in the nonaneurysmal abdominal aorta. There is no evidence of aneurysm, dissection, vasculitis, or significant stenosis. Celiac: Patent without evidence of aneurysm, dissection, vasculitis or significant stenosis. SMA: Patent without evidence of aneurysm, dissection, vasculitis or significant stenosis. Renals: Both renal arteries are patent without evidence of aneurysm, dissection, vasculitis, fibromuscular dysplasia or significant stenosis. IMA: Patent without evidence of aneurysm, dissection, vasculitis or significant stenosis. Inflow: Patent with mild plaque but no evidence of aneurysm, dissection, vasculitis or significant stenosis. Proximal Outflow: There is moderate to severe stenosis of the proximal superficial femoral artery on the left (5-118). Otherwise, the bilateral common femoral and other visualized portions of the superficial and profunda femoral arteries are patent without evidence of aneurysm, dissection, vasculitis or significant stenosis. Veins: The main portal and splenic veins are patent. Other: There is no evidence of active GI bleed. Review of the MIP images confirms the above findings. NON-VASCULAR Lower chest: Incompletely imaged linear opacities in the left base are favored to reflect atelectasis or scar. The imaged heart is unremarkable. Hepatobiliary: The liver and gallbladder are unremarkable. There is no biliary ductal dilatation. Pancreas: Unremarkable. Spleen: Unremarkable. Adrenals/Urinary Tract: The adrenals are unremarkable. Subcentimeter hypodense lesions  in both kidneys are too small to characterize but most likely reflect benign cysts requiring no  specific imaging follow-up. There are no suspicious lesions. There is no hydronephrosis or hydroureter. There are no stones in either kidney or along the course of either ureter. The bladder is decompressed by a Foley catheter. There is marked bladder wall thickening likely reflecting a history of outlet obstruction. Stomach/Bowel: The stomach is unremarkable. There is no evidence of bowel obstruction. There is no abnormal bowel wall thickening or inflammatory change. The appendix is normal. Lymphatic: There is no abdominal or pelvic lymphadenopathy. Reproductive: The prostate is enlarged. Surgical clips are again noted in the prostate. Other: There is no ascites or free air. Musculoskeletal: There is no acute osseous abnormality or suspicious osseous lesion. IMPRESSION: 1. No evidence of active GI bleed or other acute pathology in the abdomen or pelvis. 2. 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 Otherwise overall mild atherosclerotic disease. 3. Marked bladder wall thickening likely reflecting a history of outlet obstruction. The bladder is decompressed by a Foley catheter. Electronically Signed   By: Lesia Hausen M.D.   On: 10/02/2022 08:23    Medications: Scheduled:  baclofen  10 mg Oral BID   Chlorhexidine Gluconate Cloth  6 each Topical Daily   doxazosin  1 mg Oral q AM   gabapentin  200 mg Oral q AM   gabapentin  300 mg Oral BID   mirtazapine  7.5 mg Oral QHS   OLANZapine  2.5 mg Oral q morning   OLANZapine zydis  5 mg Oral QHS   rosuvastatin  20 mg Oral QHS   sodium chloride flush  3 mL Intravenous Q12H   Continuous:  Assessment/Plan: 1) Diverticular bleed. 2) Anemia.   HGB improved.  He feels well.  Okay for D/C home.  Follow up in 1-2 weeks.  LOS: 0 days   Tedrick [place] D 10/03/2022, 3:15 PM

## 2022-10-03 NOTE — TOC Transition Note (Signed)
[facility]) - CM/SW Discharge Note   Patient Details  Name: Jason Jones MRN: [id number] Date of Birth: 07/13/1950  [facility] CM/SW Contact:  Carley Hammed, LCSW Phone Number: 10/03/2022, 1:01 PM   Clinical Narrative:     Pt to be transported to [facility] via [place]. [address] Nurse to call report to [phone]  Final next level of care: Skilled Nursing Facility Barriers to Discharge: Barriers Resolved   Patient Goals and CMS Choice   Choice offered to / list presented to : Patient, Adult Children  Discharge Placement                Patient [facility] bed at: [facility] Patient to be transferred to facility by: [facility] Name of family member notified: [name] Patient and family notified of of transfer: 10/03/22  Discharge Plan and Services Additional resources added to the After Visit Summary for       Post Acute Care Choice: Skilled Nursing Facility                               Social Determinants of Health (SDOH) Interventions SDOH Screenings   Food Insecurity: No Food Insecurity (08/03/2022)  Housing: Low Risk  (08/03/2022)  Transportation Needs: No Transportation Needs (08/03/2022)  Utilities: Not At Risk (08/03/2022)  Tobacco Use: High Risk (10/02/2022)     Readmission Risk Interventions    08/05/2022   12:17 PM 07/31/2022   12:11 PM  Readmission Risk Prevention Plan  Transportation Screening Complete Complete  PCP or Specialist Appt within 3-5 Days  Complete  HRI or Home Care Consult  Complete  Social Work Consult for Recovery Care Planning/Counseling  Complete  Palliative Care Screening  Not Applicable  Medication Review [employer]) Complete Complete  PCP or Specialist appointment within 3-5 days of discharge Complete   HRI or Home Care Consult Complete   SW Recovery Care/Counseling Consult Complete   Palliative Care Screening Not Applicable   Skilled Nursing Facility Complete

## 2022-10-03 NOTE — Progress Notes (Signed)
Report Called to [place], Nurse at [facility].

## 2022-10-03 NOTE — Discharge Summary (Signed)
Physician Discharge Summary   Patient: Jason Jones MRN: [id number] DOB: 02/13/1951  Admit date:     10/02/2022  Discharge date: 10/03/22  Discharge Physician: Hollice Espy   PCP: [name], NP   Recommendations at discharge:   Patient will follow-up with his PCP to make determination if patient needs repeat fasting lipid panel and if Crestor needs to be increased. Patient have follow-up appointment with vascular surgery.  After evaluation department, decision can be made whether patient should start on Plavix. Patient returning back to skilled nursing facility Medication clarification: Patient previously had been on Bactroban for a limited amount of time.  His medicines are formally been removed from his medication list.  Discharge Diagnoses: Principal Problem:   Rectal bleeding Active Problems:   Normocytic anemia   Prostate cancer   Primary hypertension   History of CVA (cerebrovascular accident)   BPH with urinary obstruction   Chronic hepatitis C  Resolved Problems:   * No resolved hospital problems. [facility] Course: Patient is a 72 year old male with past medical history of senile dementia, alcohol use, chronic hepatitis C, hypertension, CKD3a, BPH with chronic Foley catheter and previous CVA with secondary dysphagia and contractures who presented to the emergency room on 4/24 from his nursing facility with rectal bleeding.  Patient with a previous history 2 months ago of this where he was transfused and did not have a colonoscopy done at that time, but it was planned to be done as an outpatient.  However, it has not been done yet.  In the emergency room, hemoglobin at 9.4 and creatinine of 1.39, both improvements from 2 months prior.  Patient brought in for further evaluation.  GI consulted.  A CT angiogram looking for GI bleed noted no evidence of active bleeding in the abdomen or pelvis, although incidentally noted was moderate to severe stenosis of the proximal  superficial femoral artery on the left.  Gastroenterology consulted and after reviewing angiogram felt no need for colonoscopy due to patient's physical limitations.   Assessment and Plan: Bright red blood per rectum: Hemoglobin and vital signs stable.  CT angiogram unremarkable.  GI feels no need for further evaluation.  This is likely hemorrhoidal.  Hemoglobin on 4/25 actually increased.  PAD: Patient noted on angiogram to have incidental finding of proximal femoral artery stenosis, moderate to severe.  When asked about having any pain, he said no.  Given his dementia, not the best historian.  Therefore, we will have him follow-up as outpatient with vascular surgery.  Will hold off on initiating Plavix given bright red blood per rectum and this decision can be made after visit with vascular surgery.  See below in regards to changing Crestor.  Hyperlipidemia: As mentioned above, patient with moderate to severe stenosis of the left superficial proximal femoral artery.  Patient already on Crestor 20 mg p.o. daily.  Fasting lipid panel from October noted LDL at 87.  Given his atherosclerosis, target LDL would be below 70.  Discussed with patient's daughter who could not remember when patient was started on Crestor or dose had ever been changed.  In addition, unable to see patient's primary care notes in medical record.  Recommending patient follow-up with PCP and if needed, repeat fasting lipid panel to determine if Crestor should be increased.  Essential hypertension: Stable, continue home medications  Senile dementia without behavioral disturbance: Stable, at baseline  Tobacco abuse: Patient meets criteria for lung cancer screening.  [place] [facility] will follow-up with patient  Consultants: Gastroenterology Procedures performed: GI bleed angiogram Disposition: Skilled nursing facility Diet recommendation:  Discharge Diet Orders (From admission, onward)     Start     Ordered    10/03/22 0000  Diet - low sodium heart healthy        10/03/22 1245           Heart healthy DISCHARGE MEDICATION: Allergies as of 10/03/2022       Reactions   Ceftriaxone Swelling, Other (See Comments)   Possible lips swelling (dose 07/21/22) "Allergic," per facility   Doxycycline Other (See Comments)   "ALLERGIC," per facility document        Medication List     STOP taking these medications    mupirocin ointment 2 % Commonly known as: BACTROBAN       TAKE these medications    acetaminophen 500 MG tablet Commonly known as: TYLENOL Take 2 tablets (1,000 mg total) by mouth every 8 (eight) hours as needed for mild pain, headache or moderate pain.   albuterol (2.5 MG/3ML) 0.083% nebulizer solution Commonly known as: PROVENTIL Take 3 mLs (2.5 mg total) by nebulization every 4 (four) hours as needed for wheezing or shortness of breath. What changed: when to take this   AQUAPHOR OINTMENT BODY EX Apply 1 application  topically See admin instructions. Apply to the lower legs and feet every shift   aspirin 81 MG chewable tablet Chew 1 tablet (81 mg total) by mouth daily. What changed: when to take this   atenolol 25 MG tablet Commonly known as: TENORMIN Take 12.5 mg by mouth in the morning.   b complex vitamins capsule Take 1 capsule by mouth in the morning.   baclofen 10 MG tablet Commonly known as: LIORESAL Take 10 mg by mouth See admin instructions. Take 10 mg by mouth in the morning and at 5 PM   CALCIUM ALGINATE EX Apply 1 application  topically See admin instructions. Calcium Alginate-Silver external pad 4"x 5". Apply to left ankle and foot topically every day shift every other day for wound healing.   doxazosin 1 MG tablet Commonly known as: CARDURA Take 1 mg by mouth in the morning.   feeding supplement Liqd Take 237 mLs by mouth 2 (two) times daily between meals.   ferrous gluconate 324 MG tablet Commonly known as: FERGON Take 324 mg by mouth  daily with breakfast.   gabapentin 100 MG capsule Commonly known as: NEURONTIN Take 200-300 mg by mouth See admin instructions. Take 200 mg by mouth in the morning, 300 mg at 5 PM, and 300 mg at bedtime   leptospermum manuka honey Pste paste Apply 1 Application topically daily.   mirtazapine 7.5 MG tablet Commonly known as: REMERON Take 7.5 mg by mouth at bedtime.   NON FORMULARY Take 120 mLs by mouth See admin instructions. MedPass 2.0 - Drink 120 ml's by mouth three times a day   OLANZapine zydis 5 MG disintegrating tablet Commonly known as: ZYPREXA Take 1 tablet (5 mg total) by mouth at bedtime.   OLANZapine 2.5 MG tablet Commonly known as: ZyPREXA Take 1 tablet (2.5 mg total) by mouth every morning.   polyethylene glycol 17 g packet Commonly known as: MIRALAX / GLYCOLAX Take 17 g by mouth daily as needed. What changed: reasons to take this   rosuvastatin 20 MG tablet Commonly known as: CRESTOR Take 1 tablet (20 mg total) by mouth daily. What changed: when to take this   senna-docusate 8.6-50 MG tablet Commonly known as:  Senokot-S Take 1 tablet by mouth at bedtime.   sodium chloride 0.65 % Soln nasal spray Commonly known as: OCEAN Place 1 spray into both nostrils as needed for congestion.   triamcinolone cream 0.1 % Commonly known as: KENALOG Apply 1 Application topically See admin instructions. Apply to rashes every day and evening shift- right arm, right thigh, left thigh, and any other affected areas        Discharge Exam: Filed Weights   10/02/22 0523  Weight: 61.7 kg   General: Oriented x 1-2, no acute distress Cardiovascular: Regular rate and rhythm, S1-S2  Condition at discharge: good  The results of significant diagnostics from this hospitalization (including imaging, microbiology, ancillary and laboratory) are listed below for reference.   Imaging Studies: CT ANGIO GI BLEED  Result Date: 10/02/2022 CLINICAL DATA:  Episode of dark red stool.  History of prostate cancer. EXAM: CTA ABDOMEN AND PELVIS WITHOUT AND WITH CONTRAST TECHNIQUE: Multidetector CT imaging of the abdomen and pelvis was performed using the standard protocol during bolus administration of intravenous contrast. Multiplanar reconstructed images and MIPs were obtained and reviewed to evaluate the vascular anatomy. RADIATION DOSE REDUCTION: This exam was performed according to the departmental dose-optimization program which includes automated exposure control, adjustment of the mA and/or kV according to patient size and/or use of iterative reconstruction technique. CONTRAST:  80mL OMNIPAQUE IOHEXOL 350 MG/ML SOLN COMPARISON:  CT abdomen/pelvis 04/12/2022. FINDINGS: VASCULAR Aorta: There is mild mixed plaque in the nonaneurysmal abdominal aorta. There is no evidence of aneurysm, dissection, vasculitis, or significant stenosis. Celiac: Patent without evidence of aneurysm, dissection, vasculitis or significant stenosis. SMA: Patent without evidence of aneurysm, dissection, vasculitis or significant stenosis. Renals: Both renal arteries are patent without evidence of aneurysm, dissection, vasculitis, fibromuscular dysplasia or significant stenosis. IMA: Patent without evidence of aneurysm, dissection, vasculitis or significant stenosis. Inflow: Patent with mild plaque but no evidence of aneurysm, dissection, vasculitis or significant stenosis. Proximal Outflow: There is moderate to severe stenosis of the proximal superficial femoral artery on the left (5-118). Otherwise, the bilateral common femoral and other visualized portions of the superficial and profunda femoral arteries are patent without evidence of aneurysm, dissection, vasculitis or significant stenosis. Veins: The main portal and splenic veins are patent. Other: There is no evidence of active GI bleed. Review of the MIP images confirms the above findings. NON-VASCULAR Lower chest: Incompletely imaged linear opacities in the left  base are favored to reflect atelectasis or scar. The imaged heart is unremarkable. Hepatobiliary: The liver and gallbladder are unremarkable. There is no biliary ductal dilatation. Pancreas: Unremarkable. Spleen: Unremarkable. Adrenals/Urinary Tract: The adrenals are unremarkable. Subcentimeter hypodense lesions in both kidneys are too small to characterize but most likely reflect benign cysts requiring no specific imaging follow-up. There are no suspicious lesions. There is no hydronephrosis or hydroureter. There are no stones in either kidney or along the course of either ureter. The bladder is decompressed by a Foley catheter. There is marked bladder wall thickening likely reflecting a history of outlet obstruction. Stomach/Bowel: The stomach is unremarkable. There is no evidence of bowel obstruction. There is no abnormal bowel wall thickening or inflammatory change. The appendix is normal. Lymphatic: There is no abdominal or pelvic lymphadenopathy. Reproductive: The prostate is enlarged. Surgical clips are again noted in the prostate. Other: There is no ascites or free air. Musculoskeletal: There is no acute osseous abnormality or suspicious osseous lesion. IMPRESSION: 1. No evidence of active GI bleed or other acute pathology in the abdomen or  pelvis. 2. 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 Otherwise overall mild atherosclerotic disease. 3. Marked bladder wall thickening likely reflecting a history of outlet obstruction. The bladder is decompressed by a Foley catheter. Electronically Signed   By: Lesia Hausen M.D.   On: 10/02/2022 08:23    Microbiology: Results for orders placed or performed during the hospital encounter of 10/02/22  Urine Culture     Status: Abnormal (Preliminary result)   Collection Time: 10/02/22  7:18 AM   Specimen: Urine, Catheterized  Result Value Ref Range Status   Specimen Description URINE, CATHETERIZED  Final   Special Requests  NONE  Final   Culture (A)  Final    >=100,000 COLONIES/mL GRAM NEGATIVE RODS CULTURE REINCUBATED FOR BETTER GROWTH SUSCEPTIBILITIES TO FOLLOW Performed at [facility], [address], [place], [place] [address]    Report Status PENDING  Incomplete    Labs: CBC: Recent Labs  Lab 10/02/22 0539 10/02/22 1359 10/02/22 2230 10/03/22 0652  WBC 4.2  --  4.2 4.8  NEUTROABS 2.3  --   --   --   HGB 9.4* 8.7* 8.7* 9.4*  HCT 29.2* 27.1* 27.0* 28.7*  MCV 89.8  --  89.1 87.5  PLT 218  --  214 231   Basic Metabolic Panel: Recent Labs  Lab 10/02/22 0539 10/03/22 0652  NA 137 139  K 4.2 3.7  CL 101 101  CO2 24 26  GLUCOSE 102* 85  BUN 20 19  CREATININE 1.39* 1.33*  CALCIUM 9.7 9.9   Liver Function Tests: Recent Labs  Lab 10/02/22 0539  AST 22  ALT 20  ALKPHOS 47  BILITOT 0.5  PROT 7.8  ALBUMIN 3.0*   CBG: No results for input(s): "GLUCAP" in the last 168 hours.  Discharge time spent: less than 30 minutes.  Signed: Hollice Espy, MD [facility] 10/03/2022

## 2022-10-03 NOTE — TOC Initial Note (Signed)
Transition of Care [facility]) - Initial/Assessment Note    Patient Details  Name: Jason Jones MRN: [id number] Date of Birth: 1951/01/17  Transition of Care [facility]) CM/SW Contact:    Carley Hammed, LCSW Phone Number: 10/03/2022, 11:32 AM  Clinical Narrative:                 CSW confirmed pt is from [facility] and can return back whenever he is DC. MD noted pt may be able to return today, CSW notified dtr who is in agreement. Dtr requesting to speak with MD prior to discharge. Pt will be transported by [facility]. TOC will continue to follow.   Expected Discharge Plan: Skilled Nursing Facility Barriers to Discharge: Barriers Resolved   Patient Goals and CMS Choice Patient states their goals for this hospitalization and ongoing recovery are:: Pt has been intermittently confused.   Choice offered to / list presented to : Patient, Adult Children      Expected Discharge Plan and Services     Post Acute Care Choice: Skilled Nursing Facility Living arrangements for the past 2 months: Skilled Nursing Facility                                      Prior Living Arrangements/Services Living arrangements for the past 2 months: Skilled Nursing Facility Lives with:: Facility Resident Patient language and need for interpreter reviewed:: Yes Do you feel safe going back to the place where you live?: Yes      Need for Family Participation in Patient Care: Yes (Comment) Care giver support system in place?: Yes (comment)   Criminal Activity/Legal Involvement Pertinent to Current Situation/Hospitalization: No - Comment as needed  Activities of Daily Living      Permission Sought/Granted Permission sought to share information with : Family Supports Permission granted to share information with : Yes, Verbal Permission Granted  Share Information with NAME: Archie Patten  Permission granted to share info w AGENCY: [facility]  Permission granted to share info w Relationship: Daughter      Emotional Assessment Appearance:: Appears stated age     Orientation: : Oriented to Self, Oriented to Place, Oriented to  Time, Oriented to Situation Alcohol / Substance Use: Not Applicable Psych Involvement: No (comment)  Admission diagnosis:  Hematochezia [K92.1] GI bleed [K92.2] Patient Active Problem List   Diagnosis Date Noted   GI bleed 10/02/2022   Urinary retention 07/31/2022   Hydronephrosis, bilateral 07/31/2022   Urinary tract infection associated with indwelling urethral catheter 07/31/2022   Leukocytosis 07/30/2022   Pressure injury of skin 07/20/2022   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   Chronic hepatitis C 07/20/2022   Protein-calorie malnutrition, severe 07/20/2022   Rectal bleeding 07/20/2022   Normocytic anemia 06/28/2022   Hematuria 05/01/2022   History of CVA (cerebrovascular accident) 04/17/2022   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   Malnutrition of moderate degree 04/15/2022   Primary hypertension 03/25/2022   Dysphagia due to recent cerebrovascular accident (CVA) 03/25/2022   Elevated liver enzymes 03/25/2022   Rhabdomyolysis 03/25/2022   Prostate cancer 02/20/2021   PCP:  [name], NP Pharmacy:   [facility]  - [place], [place] - [address] [place] [address] Phone: [phone] Fax: [phone]     Social Determinants of Health (SDOH) Social History: SDOH Screenings   Food Insecurity: No Food Insecurity (08/03/2022)  Housing: Low Risk  (08/03/2022)  Transportation Needs: No Transportation  Needs (08/03/2022)  Utilities: Not At Risk (08/03/2022)  Tobacco Use: High Risk (10/02/2022)   SDOH Interventions:     Readmission Risk Interventions    08/05/2022   12:17 PM 07/31/2022   12:11 PM  Readmission Risk Prevention Plan  Transportation Screening Complete Complete  PCP or Specialist Appt within 3-5 Days  Complete  HRI or Home Care Consult  Complete  Social Work Consult for  Recovery Care Planning/Counseling  Complete  Palliative Care Screening  Not Applicable  Medication Review [employer]) Complete Complete  PCP or Specialist appointment within 3-5 days of discharge Complete   HRI or Home Care Consult Complete   SW Recovery Care/Counseling Consult Complete   Palliative Care Screening Not Applicable   Skilled Nursing Facility Complete

## 2022-10-04 ENCOUNTER — Emergency Department (HOSPITAL_COMMUNITY)
Admission: EM | Admit: 2022-10-04 | Discharge: 2022-10-05 | Disposition: A | Discharge status: Home / Self Care | Payer: 59 | Attending: Emergency Medicine | Admitting: Emergency Medicine

## 2022-10-04 ENCOUNTER — Other Ambulatory Visit: Payer: Self-pay

## 2022-10-04 ENCOUNTER — Encounter (HOSPITAL_COMMUNITY): Payer: Self-pay

## 2022-10-04 DIAGNOSIS — Z7982 Long term (current) use of aspirin: Secondary | ICD-10-CM | POA: Insufficient documentation

## 2022-10-04 DIAGNOSIS — K625 Hemorrhage of anus and rectum: Secondary | ICD-10-CM | POA: Diagnosis not present

## 2022-10-04 LAB — CBC WITH DIFFERENTIAL/PLATELET
Abs Immature Granulocytes: 0.02 10*3/uL (ref 0.00–0.07)
Basophils Absolute: 0 10*3/uL (ref 0.0–0.1)
Basophils Relative: 1 %
Eosinophils Absolute: 0.3 10*3/uL (ref 0.0–0.5)
Eosinophils Relative: 6 %
HCT: 29.6 % — ABNORMAL LOW (ref 39.0–52.0)
Hemoglobin: 9.2 g/dL — ABNORMAL LOW (ref 13.0–17.0)
Immature Granulocytes: 0 %
Lymphocytes Relative: 29 %
Lymphs Abs: 1.4 10*3/uL (ref 0.7–4.0)
MCH: 29.3 pg (ref 26.0–34.0)
MCHC: 31.1 g/dL (ref 30.0–36.0)
MCV: 94.3 fL (ref 80.0–100.0)
Monocytes Absolute: 0.5 10*3/uL (ref 0.1–1.0)
Monocytes Relative: 9 %
Neutro Abs: 2.7 10*3/uL (ref 1.7–7.7)
Neutrophils Relative %: 55 %
Platelets: 195 10*3/uL (ref 150–400)
RBC: 3.14 MIL/uL — ABNORMAL LOW (ref 4.22–5.81)
RDW: 18.6 % — ABNORMAL HIGH (ref 11.5–15.5)
WBC: 4.9 10*3/uL (ref 4.0–10.5)
nRBC: 0 % (ref 0.0–0.2)

## 2022-10-04 LAB — COMPREHENSIVE METABOLIC PANEL
ALT: 22 U/L (ref 0–44)
AST: 32 U/L (ref 15–41)
Albumin: 3.1 g/dL — ABNORMAL LOW (ref 3.5–5.0)
Alkaline Phosphatase: 46 U/L (ref 38–126)
Anion gap: 15 (ref 5–15)
BUN: 28 mg/dL — ABNORMAL HIGH (ref 8–23)
CO2: 20 mmol/L — ABNORMAL LOW (ref 22–32)
Calcium: 9.3 mg/dL (ref 8.9–10.3)
Chloride: 104 mmol/L (ref 98–111)
Creatinine, Ser: 1.62 mg/dL — ABNORMAL HIGH (ref 0.61–1.24)
GFR, Estimated: 45 mL/min — ABNORMAL LOW (ref >60)
Glucose, Bld: 111 mg/dL — ABNORMAL HIGH (ref 70–99)
Potassium: 4.4 mmol/L (ref 3.5–5.1)
Sodium: 139 mmol/L (ref 135–145)
Total Bilirubin: 0.4 mg/dL (ref 0.3–1.2)
Total Protein: 7.9 g/dL (ref 6.5–8.1)

## 2022-10-04 LAB — URINE CULTURE: Culture: >=100000 — AB

## 2022-10-04 NOTE — ED Provider Notes (Signed)
[place] EMERGENCY DEPARTMENT AT [facility] Provider Note   CSN: [id number] Arrival date & time: 10/04/22  0502     History  Chief Complaint  Patient presents with   GI Bleeding    Jason Jones is a 72 y.o. male.  Patient sent to the emergency department from skilled nursing facility after nursing home staff noticed dark blood in his diaper.  Patient arrives via EMS.  He is without complaints.  Denies chest pain, shortness of breath, weakness, abdominal pain.       Home Medications Prior to Admission medications   Medication Sig Start Date End Date Taking? Authorizing Provider  acetaminophen (TYLENOL) 500 MG tablet Take 2 tablets (1,000 mg total) by mouth every 8 (eight) hours as needed for mild pain, headache or moderate pain. 08/05/22   Alwyn Ren, MD  albuterol (PROVENTIL) (2.5 MG/3ML) 0.083% nebulizer solution Take 3 mLs (2.5 mg total) by nebulization every 4 (four) hours as needed for wheezing or shortness of breath. Patient taking differently: Take 2.5 mg by nebulization in the morning and at bedtime. 03/28/22   Marrianne Mood, MD  aspirin 81 MG chewable tablet Chew 1 tablet (81 mg total) by mouth daily. Patient taking differently: Chew 81 mg by mouth in the morning. 03/29/22   Marrianne Mood, MD  atenolol (TENORMIN) 25 MG tablet Take 12.5 mg by mouth in the morning.    [provider]  b complex vitamins capsule Take 1 capsule by mouth in the morning.    [provider]  baclofen (LIORESAL) 10 MG tablet Take 10 mg by mouth See admin instructions. Take 10 mg by mouth in the morning and at 5 PM    [provider]  CALCIUM ALGINATE EX Apply 1 application  topically See admin instructions. Calcium Alginate-Silver external pad 4"x 5". Apply to left ankle and foot topically every day shift every other day for wound healing.    [provider]  doxazosin (CARDURA) 1 MG tablet Take 1 mg by mouth in the morning.     [provider]  Emollient (AQUAPHOR OINTMENT BODY EX) Apply 1 application  topically See admin instructions. Apply to the lower legs and feet every shift    [provider]  feeding supplement (ENSURE ENLIVE / ENSURE PLUS) LIQD Take 237 mLs by mouth 2 (two) times daily between meals. 03/28/22   Marrianne Mood, MD  ferrous gluconate (FERGON) 324 MG tablet Take 324 mg by mouth daily with breakfast. 07/02/22 08/31/22  [provider]  gabapentin (NEURONTIN) 100 MG capsule Take 200-300 mg by mouth See admin instructions. Take 200 mg by mouth in the morning, 300 mg at 5 PM, and 300 mg at bedtime    [provider]  leptospermum manuka honey (MEDIHONEY) PSTE paste Apply 1 Application topically daily. 07/24/22   Calvert Cantor, MD  mirtazapine (REMERON) 7.5 MG tablet Take 7.5 mg by mouth at bedtime.    [provider]  NON FORMULARY Take 120 mLs by mouth See admin instructions. MedPass 2.0 - Drink 120 ml's by mouth three times a day    [provider]  OLANZapine (ZYPREXA) 2.5 MG tablet Take 1 tablet (2.5 mg total) by mouth every morning. 08/05/22 08/05/23  Alwyn Ren, MD  OLANZapine zydis (ZYPREXA) 5 MG disintegrating tablet Take 1 tablet (5 mg total) by mouth at bedtime. 08/05/22   Alwyn Ren, MD  polyethylene glycol (MIRALAX / GLYCOLAX) 17 g packet Take 17 g by mouth daily  as needed. Patient taking differently: Take 17 g by mouth daily as needed (for constipation). 04/16/22   Kathlen Mody, MD  rosuvastatin (CRESTOR) 20 MG tablet Take 1 tablet (20 mg total) by mouth daily. Patient taking differently: Take 20 mg by mouth at bedtime. 03/29/22   Marrianne Mood, MD  senna-docusate (SENOKOT-S) 8.6-50 MG tablet Take 1 tablet by mouth at bedtime.    [provider]  sodium chloride (OCEAN) 0.65 % SOLN nasal spray Place 1 spray into both nostrils as needed for congestion. 04/16/22   Kathlen Mody, MD  triamcinolone cream (KENALOG)  0.1 % Apply 1 Application topically See admin instructions. Apply to rashes every day and evening shift- right arm, right thigh, left thigh, and any other affected areas    [provider]      Allergies    Ceftriaxone and Doxycycline    Review of Systems   Review of Systems  Physical Exam Updated Vital Signs BP 118/77   Pulse 90   Temp 98.5 F (36.9 C)   Resp (!) 22   Ht 5\' 11"  (1.803 m)   Wt 61 kg   SpO2 95%   BMI 18.76 kg/m  Physical Exam Vitals and nursing note reviewed.  Constitutional:      General: He is not in acute distress.    Appearance: He is well-developed.  HENT:     Head: Normocephalic and atraumatic.     Mouth/Throat:     Mouth: Mucous membranes are moist.  Eyes:     General: Vision grossly intact. Gaze aligned appropriately.     Extraocular Movements: Extraocular movements intact.     Conjunctiva/sclera: Conjunctivae normal.  Cardiovascular:     Rate and Rhythm: Normal rate and regular rhythm.     Pulses: Normal pulses.     Heart sounds: Normal heart sounds, S1 normal and S2 normal. No murmur heard.    No friction rub. No gallop.  Pulmonary:     Effort: Pulmonary effort is normal. No respiratory distress.     Breath sounds: Normal breath sounds.  Abdominal:     Palpations: Abdomen is soft.     Tenderness: There is no abdominal tenderness. There is no guarding or rebound.     Hernia: No hernia is present.  Musculoskeletal:        General: No swelling.     Cervical back: Full passive range of motion without pain, normal range of motion and neck supple. No pain with movement, spinous process tenderness or muscular tenderness. Normal range of motion.     Right lower leg: No edema.     Left lower leg: No edema.  Skin:    General: Skin is warm and dry.     Capillary Refill: Capillary refill takes less than 2 seconds.     Findings: No ecchymosis, erythema, lesion or wound.  Neurological:     Mental Status: He is alert. Mental status is at  baseline.     Cranial Nerves: Cranial nerves 2-12 are intact.     Motor: No weakness or abnormal muscle tone.     Coordination: Coordination is intact.     Comments: Left spastic hemiparesis  Psychiatric:        Mood and Affect: Mood normal.        Speech: Speech normal.        Behavior: Behavior normal.     ED Results / Procedures / Treatments   Labs (all labs ordered are listed, but only abnormal results are  displayed) Labs Reviewed  CBC WITH DIFFERENTIAL/PLATELET - Abnormal; Notable for the following components:      Result Value   RBC 3.14 (*)    Hemoglobin 9.2 (*)    HCT 29.6 (*)    RDW 18.6 (*)    All other components within normal limits  COMPREHENSIVE METABOLIC PANEL - Abnormal; Notable for the following components:   CO2 20 (*)    Glucose, Bld 111 (*)    BUN 28 (*)    Creatinine, Ser 1.62 (*)    Albumin 3.1 (*)    GFR, Estimated 45 (*)    All other components within normal limits    EKG None  Radiology CT ANGIO GI BLEED  Result Date: 10/02/2022 CLINICAL DATA:  Episode of dark red stool. History of prostate cancer. EXAM: CTA ABDOMEN AND PELVIS WITHOUT AND WITH CONTRAST TECHNIQUE: Multidetector CT imaging of the abdomen and pelvis was performed using the standard protocol during bolus administration of intravenous contrast. Multiplanar reconstructed images and MIPs were obtained and reviewed to evaluate the vascular anatomy. RADIATION DOSE REDUCTION: This exam was performed according to the departmental dose-optimization program which includes automated exposure control, adjustment of the mA and/or kV according to patient size and/or use of iterative reconstruction technique. CONTRAST:  80mL OMNIPAQUE IOHEXOL 350 MG/ML SOLN COMPARISON:  CT abdomen/pelvis 04/12/2022. FINDINGS: VASCULAR Aorta: There is mild mixed plaque in the nonaneurysmal abdominal aorta. There is no evidence of aneurysm, dissection, vasculitis, or significant stenosis. Celiac: Patent without evidence of  aneurysm, dissection, vasculitis or significant stenosis. SMA: Patent without evidence of aneurysm, dissection, vasculitis or significant stenosis. Renals: Both renal arteries are patent without evidence of aneurysm, dissection, vasculitis, fibromuscular dysplasia or significant stenosis. IMA: Patent without evidence of aneurysm, dissection, vasculitis or significant stenosis. Inflow: Patent with mild plaque but no evidence of aneurysm, dissection, vasculitis or significant stenosis. Proximal Outflow: There is moderate to severe stenosis of the proximal superficial femoral artery on the left (5-118). Otherwise, the bilateral common femoral and other visualized portions of the superficial and profunda femoral arteries are patent without evidence of aneurysm, dissection, vasculitis or significant stenosis. Veins: The main portal and splenic veins are patent. Other: There is no evidence of active GI bleed. Review of the MIP images confirms the above findings. NON-VASCULAR Lower chest: Incompletely imaged linear opacities in the left base are favored to reflect atelectasis or scar. The imaged heart is unremarkable. Hepatobiliary: The liver and gallbladder are unremarkable. There is no biliary ductal dilatation. Pancreas: Unremarkable. Spleen: Unremarkable. Adrenals/Urinary Tract: The adrenals are unremarkable. Subcentimeter hypodense lesions in both kidneys are too small to characterize but most likely reflect benign cysts requiring no specific imaging follow-up. There are no suspicious lesions. There is no hydronephrosis or hydroureter. There are no stones in either kidney or along the course of either ureter. The bladder is decompressed by a Foley catheter. There is marked bladder wall thickening likely reflecting a history of outlet obstruction. Stomach/Bowel: The stomach is unremarkable. There is no evidence of bowel obstruction. There is no abnormal bowel wall thickening or inflammatory change. The appendix is  normal. Lymphatic: There is no abdominal or pelvic lymphadenopathy. Reproductive: The prostate is enlarged. Surgical clips are again noted in the prostate. Other: There is no ascites or free air. Musculoskeletal: There is no acute osseous abnormality or suspicious osseous lesion. IMPRESSION: 1. No evidence of active GI bleed or other acute pathology in the abdomen or pelvis. 2. 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 Otherwise overall mild atherosclerotic disease. 3. Marked bladder wall thickening likely reflecting a history of outlet obstruction. The bladder is decompressed by a Foley catheter. Electronically Signed   By: Lesia Hausen M.D.   On: 10/02/2022 08:23    Procedures Procedures    Medications Ordered in ED Medications - No data to display  ED Course/ Medical Decision Making/ A&P                             Medical Decision Making Amount and/or Complexity of Data Reviewed External Data Reviewed: labs, radiology and notes. Labs: ordered. Decision-making details documented in ED Course.   Patient returns because of rectal bleeding.  Patient was seen and admitted on April 24.  At that time patient underwent CT angiography bleeding study which did not show active bleeding.  No evidence of ischemia.  Patient was seen by GI but it was not felt that he would need colonoscopy because of his chronic debilitation.  At arrival patient is without complaints.  Patient's hemoglobin is unchanged from prior.  He has not had any further bleeding here in the emergency department.  Based on his hospitalization, consultation with GI, does not require any further interventions or workup at this time.  Will be returned to the nursing home, can be sent back for repeat evaluation if bleeding continues.        Final Clinical Impression(s) / ED Diagnoses Final diagnoses:  Rectal bleeding    Rx / DC Orders ED Discharge Orders     None          Manju Kulkarni, Canary Brim, MD 10/04/22 [phone]

## 2022-10-04 NOTE — ED Notes (Signed)
Pt changed into a clean diaper after a BM. BM did have dark red blood present.

## 2022-10-04 NOTE — ED Notes (Addendum)
[facility] called- 2nd in line

## 2022-10-04 NOTE — ED Triage Notes (Signed)
Pt coming in for GI bleed reported by the staff at [facility], Was here on 4/24  for the same issue. Reports of stool in the diaper was dark tarry stool with some brighter red streaks. No abd complaints, No pain complaints.   Medic Vitals  142/80 95% RA 82hr 17rr

## 2022-10-15 ENCOUNTER — Telehealth: Payer: Self-pay | Admitting: Internal Medicine

## 2022-10-15 NOTE — Telephone Encounter (Signed)
Dr. Leone Payor,  We received an urgent referral from [facility] : [facility] [phone] for a GI bleed (records in [facility]).  Will you review and advise urgency and scheduling.  Thanks Christy  Dr. Leone Payor DOD 5/7/24AM

## 2022-10-15 NOTE — Telephone Encounter (Signed)
Based upon my review of the chart this patient is a patient of Dr. Haywood Pao and they should contact that clinic for an appointment.  He was seen by Dr. Elnoria Howard in the hospital recently and it looks like the plan was for follow-up in the office.

## 2022-10-16 NOTE — Telephone Encounter (Signed)
Unable to reach pt. Home and cell phone go directly to busy signal.

## 2022-10-17 NOTE — Telephone Encounter (Signed)
Unable to reach patient. Both home & cell phone ring, and then go busy.

## 2022-10-18 NOTE — Telephone Encounter (Signed)
See note below from referral coordinator:   Type Date User Summary Attachment  General 10/16/2022  8:05 AM Karna Christmas D Spoke with Selena Batten @ [facility] and relayed the message from Dr. Leone Payor that patient needs to followup with Dr. Elnoria Howard since he saw him in the hospital.  She said she will relay the message to Tangela his nurse regarding this. -  Note: Spoke with Selena Batten @ [facility] and relayed the message from Dr. Leone Payor that patient needs to followup with Dr. Elnoria Howard since he saw him in the hospital.  She said she will relay the message to Tangela his nurse regarding this.

## 2022-11-11 ENCOUNTER — Other Ambulatory Visit: Payer: Self-pay | Admitting: *Deleted

## 2022-11-11 DIAGNOSIS — R9389 Abnormal findings on diagnostic imaging of other specified body structures: Secondary | ICD-10-CM

## 2022-11-12 ENCOUNTER — Ambulatory Visit (HOSPITAL_COMMUNITY)
Admission: RE | Admit: 2022-11-12 | Discharge: 2022-11-12 | Disposition: A | Discharge status: Home / Self Care | Payer: 59 | Source: Ambulatory Visit | Attending: Surgery | Admitting: Surgery

## 2022-11-12 DIAGNOSIS — R9389 Abnormal findings on diagnostic imaging of other specified body structures: Secondary | ICD-10-CM | POA: Diagnosis present

## 2022-11-12 LAB — VAS US ABI WITH/WO TBI
Left ABI: 0.68
Right ABI: 0.53

## 2022-11-14 ENCOUNTER — Ambulatory Visit (INDEPENDENT_AMBULATORY_CARE_PROVIDER_SITE_OTHER): Payer: 59 | Admitting: Surgery

## 2022-11-14 ENCOUNTER — Encounter: Payer: Self-pay | Admitting: Surgery

## 2022-11-14 VITALS — BP 133/69 | HR 59 | Temp 97.9°F | Resp 16 | Ht 71.0 in | Wt 145.0 lb

## 2022-11-14 DIAGNOSIS — I7025 Atherosclerosis of native arteries of other extremities with ulceration: Secondary | ICD-10-CM

## 2022-11-14 NOTE — Progress Notes (Signed)
Vascular and Vein Specialist of   Patient name: Jason Jones MRN: [id number] DOB: November 23, 1950 Sex: male   REQUESTING PROVIDER:    ED   REASON FOR CONSULT:    SFA stenosis  HISTORY OF PRESENT ILLNESS:   Jason Jones is a 72 y.o. male, who was sent from his skilled nursing facility to the emergency department on 10/04/2022 after the staff noticed dark blood in his diaper.  He was diagnosed with rectal bleeding.  He had previously been hospitalized and CT angiography did not show an active bleed.  His hemoglobin was stable.  GI felt that he would not need colonoscopy because of chronic debilitation.  His hemoglobin remained stable.  On CT scan imaging he was found to have a proximal SFA stenosis for which she was referred to vascular surgery.  Patient has a history of stroke.  This has affected the left side.  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  He is nonambulatory he is medically managed for hypertension.  He is a current smoker.  He takes a statin for hypercholesterolemia.  PAST MEDICAL HISTORY    Past Medical History:  Diagnosis Date   Acute ischemic right PCA stroke (HCC) 03/25/2022   Arthritis    Asthma    Cancer (HCC)    CAP (community acquired pneumonia) 07/20/2022   Chronic alcohol abuse    Dyspnea    GERD (gastroesophageal reflux disease)    PRN  ---  TAKES BAKING SODA IN WATER   History of pneumothorax    04-28-2006  fell, left fx rib--  resolved w/ chest tube   Hypertension    Nocturia    Poor dental hygiene    Right inguinal hernia    Weak urinary stream      FAMILY HISTORY   Family History  Problem Relation Age of Onset   Prostate cancer Cousin    Prostate cancer Cousin     SOCIAL HISTORY:   Social History   Socioeconomic History   Marital status: Divorced    Spouse name: Not on file   Number of children: Not on file   Years of education: Not on file   Highest education level: Not  on file  Occupational History   Not on file  Tobacco Use   Smoking status: Every Day    Packs/day: 0.50    Years: 50.00    Additional pack years: 0.00    Total pack years: 25.00    Types: Cigarettes   Smokeless tobacco: Never  Vaping Use   Vaping Use: Never used  Substance and Sexual Activity   Alcohol use: Not Currently    Comment: last use 2-3 months ago ,   Drug use: Not Currently    Types: Marijuana    Comment: last use 2 years ago per pt   Sexual activity: Not Currently  Other Topics Concern   Not on file  Social History Narrative   Not on file   Social Determinants of Health   Financial Resource Strain: Not on file  Food Insecurity: No Food Insecurity (08/03/2022)   Hunger Vital Sign    Worried About Running Out of Food in the Last Year: Never true    Ran Out of Food in the Last Year: Never true  Transportation Needs: No Transportation Needs (08/03/2022)   PRAPARE - Administrator, Civil Service (Medical): No    Lack of Transportation (Non-Medical): No  Physical Activity: Not on file  Stress:  Not on file  Social Connections: Not on file  Intimate Partner Violence: Not At Risk (08/03/2022)   Humiliation, Afraid, Rape, and Kick questionnaire    Fear of Current or Ex-Partner: No    Emotionally Abused: No    Physically Abused: No    Sexually Abused: No    ALLERGIES:    Allergies  Allergen Reactions   Ceftriaxone Swelling and Other (See Comments)    Possible lips swelling (dose 07/21/22) "Allergic," per facility   Doxycycline Other (See Comments)    "ALLERGIC," per facility document    CURRENT MEDICATIONS:    Current Outpatient Medications  Medication Sig Dispense Refill   acetaminophen (TYLENOL) 500 MG tablet Take 2 tablets (1,000 mg total) by mouth every 8 (eight) hours as needed for mild pain, headache or moderate pain. 30 tablet 0   albuterol (PROVENTIL) (2.5 MG/3ML) 0.083% nebulizer solution Take 3 mLs (2.5 mg total) by nebulization every 4  (four) hours as needed for wheezing or shortness of breath. (Patient taking differently: Take 2.5 mg by nebulization in the morning and at bedtime.) 75 mL 12   aspirin 81 MG chewable tablet Chew 1 tablet (81 mg total) by mouth daily. (Patient taking differently: Chew 81 mg by mouth in the morning.)     atenolol (TENORMIN) 25 MG tablet Take 12.5 mg by mouth in the morning.     b complex vitamins capsule Take 1 capsule by mouth in the morning.     baclofen (LIORESAL) 10 MG tablet Take 10 mg by mouth See admin instructions. Take 10 mg by mouth in the morning and at 5 PM     CALCIUM ALGINATE EX Apply 1 application  topically See admin instructions. Calcium Alginate-Silver external pad 4"x 5". Apply to left ankle and foot topically every day shift every other day for wound healing.     doxazosin (CARDURA) 1 MG tablet Take 1 mg by mouth in the morning.     Emollient (AQUAPHOR OINTMENT BODY EX) Apply 1 application  topically See admin instructions. Apply to the lower legs and feet every shift     feeding supplement (ENSURE ENLIVE / ENSURE PLUS) LIQD Take 237 mLs by mouth 2 (two) times daily between meals. 237 mL 12   gabapentin (NEURONTIN) 100 MG capsule Take 200-300 mg by mouth See admin instructions. Take 200 mg by mouth in the morning, 300 mg at 5 PM, and 300 mg at bedtime     leptospermum manuka honey (MEDIHONEY) PSTE paste Apply 1 Application topically daily.     mirtazapine (REMERON) 7.5 MG tablet Take 7.5 mg by mouth at bedtime.     NON FORMULARY Take 120 mLs by mouth See admin instructions. MedPass 2.0 - Drink 120 ml's by mouth three times a day     OLANZapine (ZYPREXA) 2.5 MG tablet Take 1 tablet (2.5 mg total) by mouth every morning. 30 tablet 2   OLANZapine zydis (ZYPREXA) 5 MG disintegrating tablet Take 1 tablet (5 mg total) by mouth at bedtime.     polyethylene glycol (MIRALAX / GLYCOLAX) 17 g packet Take 17 g by mouth daily as needed. (Patient taking differently: Take 17 g by mouth daily as  needed (for constipation).) 14 each 0   rosuvastatin (CRESTOR) 20 MG tablet Take 1 tablet (20 mg total) by mouth daily. (Patient taking differently: Take 20 mg by mouth at bedtime.)     senna-docusate (SENOKOT-S) 8.6-50 MG tablet Take 1 tablet by mouth at bedtime.     sodium chloride (  OCEAN) 0.65 % SOLN nasal spray Place 1 spray into both nostrils as needed for congestion. 15 mL 0   triamcinolone cream (KENALOG) 0.1 % Apply 1 Application topically See admin instructions. Apply to rashes every day and evening shift- right arm, right thigh, left thigh, and any other affected areas     ferrous gluconate (FERGON) 324 MG tablet Take 324 mg by mouth daily with breakfast.     No current facility-administered medications for this visit.    REVIEW OF SYSTEMS:   [X]  denotes positive finding, [ ]  denotes negative finding Cardiac  Comments:  Chest pain or chest pressure:    Shortness of breath upon exertion:    Short of breath when lying flat:    Irregular heart rhythm:        Vascular    Pain in calf, thigh, or hip brought on by ambulation:    Pain in feet at night that wakes you up from your sleep:     Blood clot in your veins:    Leg swelling:         Pulmonary    Oxygen at home:    Productive cough:     Wheezing:         Neurologic    Sudden weakness in arms or legs:     Sudden numbness in arms or legs:     Sudden onset of difficulty speaking or slurred speech:    Temporary loss of vision in one eye:     Problems with dizziness:         Gastrointestinal    Blood in stool:  x    Vomited blood:         Genitourinary    Burning when urinating:     Blood in urine:        Psychiatric    Major depression:         Hematologic    Bleeding problems:    Problems with blood clotting too easily:        Skin    Rashes or ulcers: x       Constitutional    Fever or chills:     PHYSICAL EXAM:   Vitals:   11/14/22 0845  BP: 133/69  Pulse: (!) 59  Resp: 16  Temp: 97.9 F (36.6  C)  TempSrc: Temporal  SpO2: 99%  Weight: 145 lb (65.8 kg)  Height: 5\' 11"  (1.803 m)    GENERAL: The patient is a well-nourished male, in no acute distress. The vital signs are documented above. CARDIAC: There is a regular rate and rhythm.  VASCULAR: Triphasic left posterior tibial Doppler signal PULMONARY: Nonlabored respirations ABDOMEN: Soft and non-tender with normal pitched bowel sounds.  MUSCULOSKELETAL: There are no major deformities or cyanosis. NEUROLOGIC: No focal weakness or paresthesias are detected. SKIN: 2 superficial abrasions to the left foot which appear to be pressure related PSYCHIATRIC: The patient has a normal affect.  STUDIES:   I have reviewed his CT scan with the following findings: 1. No evidence of active GI bleed or other acute pathology in the abdomen or pelvis. 2. 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 Otherwise overall mild atherosclerotic disease. 3. Marked bladder wall thickening likely reflecting a history of outlet obstruction. The bladder is decompressed by a Foley catheter.   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  He  has 2 small superficial abrasions on the left foot that appear to be related to pressure.  He tells me that he does wear a boot while sleeping at night.  I was able to get brisk posterior tibial Doppler signals.  I am however worried about the ulcers progressing.  I have written orders for him to have strict offloading of this left foot area continuously.  He also will need wound care at his skilled nursing facility.  I will have him return in 1 month for ABIs to confirm that he has adequate blood flow for healing.  I am a little concerned about angiography because of his contracture.  He may be best served with primary amputation if these wounds progress, however he is adamantly refusing this currently.  I will see him  back in 1 month   Durene Cal, IV, MD, FACS [facility] Vein Specialists of [facility] [phone] Pager [phone]

## 2022-11-18 ENCOUNTER — Other Ambulatory Visit: Payer: Self-pay | Admitting: *Deleted

## 2022-11-18 DIAGNOSIS — I7025 Atherosclerosis of native arteries of other extremities with ulceration: Secondary | ICD-10-CM

## 2022-12-06 ENCOUNTER — Other Ambulatory Visit: Payer: Self-pay

## 2022-12-06 ENCOUNTER — Emergency Department (HOSPITAL_COMMUNITY): Payer: 59

## 2022-12-06 ENCOUNTER — Encounter (HOSPITAL_COMMUNITY): Payer: Self-pay

## 2022-12-06 ENCOUNTER — Emergency Department (HOSPITAL_COMMUNITY)
Admission: EM | Admit: 2022-12-06 | Discharge: 2022-12-06 | Disposition: A | Discharge status: Home / Self Care | Payer: 59 | Attending: Emergency Medicine | Admitting: Emergency Medicine

## 2022-12-06 DIAGNOSIS — R531 Weakness: Secondary | ICD-10-CM | POA: Diagnosis not present

## 2022-12-06 DIAGNOSIS — Z7982 Long term (current) use of aspirin: Secondary | ICD-10-CM | POA: Diagnosis not present

## 2022-12-06 DIAGNOSIS — K625 Hemorrhage of anus and rectum: Secondary | ICD-10-CM | POA: Insufficient documentation

## 2022-12-06 LAB — COMPREHENSIVE METABOLIC PANEL
ALT: 19 U/L (ref 0–44)
AST: 21 U/L (ref 15–41)
Albumin: 3.6 g/dL (ref 3.5–5.0)
Alkaline Phosphatase: 46 U/L (ref 38–126)
Anion gap: 9 (ref 5–15)
BUN: 24 mg/dL — ABNORMAL HIGH (ref 8–23)
CO2: 25 mmol/L (ref 22–32)
Calcium: 9.5 mg/dL (ref 8.9–10.3)
Chloride: 104 mmol/L (ref 98–111)
Creatinine, Ser: 1.08 mg/dL (ref 0.61–1.24)
GFR, Estimated: >60 mL/min (ref >60)
Glucose, Bld: 101 mg/dL — ABNORMAL HIGH (ref 70–99)
Potassium: 4.1 mmol/L (ref 3.5–5.1)
Sodium: 138 mmol/L (ref 135–145)
Total Bilirubin: 0.4 mg/dL (ref 0.3–1.2)
Total Protein: 8.3 g/dL — ABNORMAL HIGH (ref 6.5–8.1)

## 2022-12-06 LAB — CBC WITH DIFFERENTIAL/PLATELET
Abs Immature Granulocytes: 0 10*3/uL (ref 0.00–0.07)
Basophils Absolute: 0 10*3/uL (ref 0.0–0.1)
Basophils Relative: 1 %
Eosinophils Absolute: 0.5 10*3/uL (ref 0.0–0.5)
Eosinophils Relative: 9 %
HCT: 30.2 % — ABNORMAL LOW (ref 39.0–52.0)
Hemoglobin: 9.5 g/dL — ABNORMAL LOW (ref 13.0–17.0)
Immature Granulocytes: 0 %
Lymphocytes Relative: 33 %
Lymphs Abs: 1.8 10*3/uL (ref 0.7–4.0)
MCH: 29.5 pg (ref 26.0–34.0)
MCHC: 31.5 g/dL (ref 30.0–36.0)
MCV: 93.8 fL (ref 80.0–100.0)
Monocytes Absolute: 0.5 10*3/uL (ref 0.1–1.0)
Monocytes Relative: 9 %
Neutro Abs: 2.7 10*3/uL (ref 1.7–7.7)
Neutrophils Relative %: 48 %
Platelets: 295 10*3/uL (ref 150–400)
RBC: 3.22 MIL/uL — ABNORMAL LOW (ref 4.22–5.81)
RDW: 15.7 % — ABNORMAL HIGH (ref 11.5–15.5)
WBC: 5.5 10*3/uL (ref 4.0–10.5)
nRBC: 0 % (ref 0.0–0.2)

## 2022-12-06 LAB — TYPE AND SCREEN
ABO/RH(D): A POS
Antibody Screen: NEGATIVE

## 2022-12-06 LAB — PROTIME-INR
INR: 1 (ref 0.8–1.2)
Prothrombin Time: 13.4 seconds (ref 11.4–15.2)

## 2022-12-06 LAB — POC OCCULT BLOOD, ED: Fecal Occult Bld: POSITIVE — AB

## 2022-12-06 MED ORDER — SODIUM CHLORIDE 0.9 % IV SOLN: INTRAVENOUS | Status: DC

## 2022-12-06 MED ORDER — PANTOPRAZOLE SODIUM 40 MG IV SOLR
40.0000 mg | Freq: Once | INTRAVENOUS | Status: AC
  Administered 2022-12-06: 40 mg via INTRAVENOUS
  Filled 2022-12-06: qty 10

## 2022-12-06 MED ORDER — IOHEXOL 350 MG/ML SOLN
100.0000 mL | Freq: Once | INTRAVENOUS | Status: AC | PRN
  Administered 2022-12-06: 100 mL via INTRAVENOUS

## 2022-12-06 MED ORDER — SODIUM CHLORIDE 0.9 % IV BOLUS
1000.0000 mL | Freq: Once | INTRAVENOUS | Status: AC
  Administered 2022-12-06: 1000 mL via INTRAVENOUS

## 2022-12-06 MED ORDER — SODIUM CHLORIDE (PF) 0.9 % IJ SOLN
INTRAMUSCULAR | Status: AC
  Filled 2022-12-06: qty 50

## 2022-12-06 NOTE — ED Provider Notes (Addendum)
  Physical Exam  BP (!) 139/106   Pulse 61   Temp 97.8 F (36.6 C)   Resp 14   SpO2 95%   Physical Exam  Procedures  Procedures  ED Course / MDM    Medical Decision Making Amount and/or Complexity of Data Reviewed Labs: ordered. Radiology: ordered.  Risk Prescription drug management.   Pt has cc of GI bleed. Hb stable. Recurrent bleed, previously GI thought pt had hemorrhoid bleed. Per EMS - pt had "pool of blood" by him. Foley catheter looks fine.  CT-A ordered, repeat Hb ordered. If reassuring, can be discharged with f/u with GI and return precautions.     Derwood Kaplan, MD 12/06/22 0734  9:55 AM Nursing staff has not send repeat CBC.  Patient however has not had a bloody bowel movement here.  Also CT angiogram is negative for active bleed.  I do not see any utility in getting repeat CBC at this time.  Hemodynamically stable, heart rate 66.  Stable for discharge to the nursing home with return precautions.    Derwood Kaplan, MD 12/06/22 [id number]

## 2022-12-06 NOTE — ED Triage Notes (Signed)
Pt BIB [facility] from [facility] for a rectal bleed. CNA noticed a "puddle of blood" in his bed earlier and tried to manage it with no success. Pt's night shift nurse last night did the same thing with no relief. According to facility this is recurrent.

## 2022-12-06 NOTE — Discharge Instructions (Addendum)
Follow-up with Dr. Elnoria Howard for further evaluation of your bleeding.  Return to the ED with new or worsening symptoms. Hemoglobin is normal baseline at 9.5. CT scan doesn't show active bleed and no bloody stool in the ER.  Return to the ER if there is large bloody stools or frequent bloody stools. Have CBC or hemoglobin rechecked in 2-3 days.

## 2022-12-06 NOTE — ED Provider Notes (Signed)
[place] EMERGENCY DEPARTMENT AT [facility] Provider Note   CSN: [id number] Arrival date & time: 12/06/22  0443     History  No chief complaint on file.   Jason Jones is a 72 y.o. male.  Patient from facility with concern for rectal bleeding.  Facility reports he was "sitting in a puddle of blood".  This happened the past 2 nights.  Patient believes he was bleeding from his Foley catheter not from his buttocks but is not sure.  Denies any recent black or bloody stools.  No nausea, vomiting, chest pain or shortness of breath.  Denies any abdominal pain or back pain.  Denies any blood thinner use.  Does take aspirin.  Does not feel dizzy or lightheaded.  He has left-sided weakness from previous stroke which is unchanged.  Does have a Foley catheter at baseline he believes there is a break in the tubing and that is where the blood was coming from.  He has been admitted previously with bleeding thought to be hemorrhoidal or diverticular in origin.  Endoscopy was not performed given his debilitated state.  The history is provided by the patient.       Home Medications Prior to Admission medications   Medication Sig Start Date End Date Taking? Authorizing Provider  acetaminophen (TYLENOL) 500 MG tablet Take 2 tablets (1,000 mg total) by mouth every 8 (eight) hours as needed for mild pain, headache or moderate pain. 08/05/22   Alwyn Ren, MD  albuterol (PROVENTIL) (2.5 MG/3ML) 0.083% nebulizer solution Take 3 mLs (2.5 mg total) by nebulization every 4 (four) hours as needed for wheezing or shortness of breath. Patient taking differently: Take 2.5 mg by nebulization in the morning and at bedtime. 03/28/22   Marrianne Mood, MD  aspirin 81 MG chewable tablet Chew 1 tablet (81 mg total) by mouth daily. Patient taking differently: Chew 81 mg by mouth in the morning. 03/29/22   Marrianne Mood, MD  atenolol (TENORMIN) 25 MG tablet Take 12.5 mg by mouth in the  morning.    [provider]  b complex vitamins capsule Take 1 capsule by mouth in the morning.    [provider]  baclofen (LIORESAL) 10 MG tablet Take 10 mg by mouth See admin instructions. Take 10 mg by mouth in the morning and at 5 PM    [provider]  CALCIUM ALGINATE EX Apply 1 application  topically See admin instructions. Calcium Alginate-Silver external pad 4"x 5". Apply to left ankle and foot topically every day shift every other day for wound healing.    [provider]  doxazosin (CARDURA) 1 MG tablet Take 1 mg by mouth in the morning.    [provider]  Emollient (AQUAPHOR OINTMENT BODY EX) Apply 1 application  topically See admin instructions. Apply to the lower legs and feet every shift    [provider]  feeding supplement (ENSURE ENLIVE / ENSURE PLUS) LIQD Take 237 mLs by mouth 2 (two) times daily between meals. 03/28/22   Marrianne Mood, MD  ferrous gluconate (FERGON) 324 MG tablet Take 324 mg by mouth daily with breakfast. 07/02/22 08/31/22  [provider]  gabapentin (NEURONTIN) 100 MG capsule Take 200-300 mg by mouth See admin instructions. Take 200 mg by mouth in the morning, 300 mg at 5 PM, and 300 mg at bedtime    [provider]  leptospermum manuka honey (MEDIHONEY) PSTE paste Apply 1 Application topically daily. 07/24/22   Calvert Cantor, MD  mirtazapine (REMERON) 7.5 MG tablet Take 7.5 mg by mouth at bedtime.    [provider]  NON FORMULARY Take 120 mLs by mouth See admin instructions. MedPass 2.0 - Drink 120 ml's by mouth three times a day    [provider]  OLANZapine (ZYPREXA) 2.5 MG tablet Take 1 tablet (2.5 mg total) by mouth every morning. 08/05/22 08/05/23  Alwyn Ren, MD  OLANZapine zydis (ZYPREXA) 5 MG disintegrating tablet Take 1 tablet (5 mg total) by mouth at bedtime. 08/05/22   Alwyn Ren, MD  polyethylene glycol (MIRALAX / GLYCOLAX) 17 g packet  Take 17 g by mouth daily as needed. Patient taking differently: Take 17 g by mouth daily as needed (for constipation). 04/16/22   Kathlen Mody, MD  rosuvastatin (CRESTOR) 20 MG tablet Take 1 tablet (20 mg total) by mouth daily. Patient taking differently: Take 20 mg by mouth at bedtime. 03/29/22   Marrianne Mood, MD  senna-docusate (SENOKOT-S) 8.6-50 MG tablet Take 1 tablet by mouth at bedtime.    [provider]  sodium chloride (OCEAN) 0.65 % SOLN nasal spray Place 1 spray into both nostrils as needed for congestion. 04/16/22   Kathlen Mody, MD  triamcinolone cream (KENALOG) 0.1 % Apply 1 Application topically See admin instructions. Apply to rashes every day and evening shift- right arm, right thigh, left thigh, and any other affected areas    [provider]      Allergies    Ceftriaxone and Doxycycline    Review of Systems   Review of Systems  Constitutional:  Positive for fatigue. Negative for activity change, appetite change and fever.  HENT:  Negative for congestion and rhinorrhea.   Respiratory:  Negative for cough, chest tightness and shortness of breath.   Cardiovascular:  Negative for chest pain and leg swelling.  Gastrointestinal:  Positive for blood in stool. Negative for abdominal pain, nausea and vomiting.  Genitourinary:  Negative for dysuria and hematuria.  Musculoskeletal:  Negative for arthralgias and myalgias.  Skin:  Negative for rash.  Neurological:  Positive for weakness. Negative for dizziness and headaches.   all other systems are negative except as noted in the HPI and PMH.    Physical Exam Updated Vital Signs BP (!) 139/106   Pulse 61   Temp 97.8 F (36.6 C)   Resp 14   SpO2 95%  Physical Exam Vitals and nursing note reviewed.  Constitutional:      General: He is not in acute distress.    Appearance: He is well-developed.  HENT:     Head: Normocephalic and atraumatic.     Mouth/Throat:     Pharynx: No oropharyngeal exudate.   Eyes:     Conjunctiva/sclera: Conjunctivae normal.     Pupils: Pupils are equal, round, and reactive to light.  Neck:     Comments: No meningismus. Cardiovascular:     Rate and Rhythm: Normal rate and regular rhythm.     Heart sounds: Normal heart sounds. No murmur heard. Pulmonary:     Effort: Pulmonary effort is normal. No respiratory distress.     Breath sounds: Normal breath sounds.  Abdominal:     Palpations: Abdomen is soft.     Tenderness: There is no abdominal tenderness. There is no guarding or rebound.  Genitourinary:    Comments: Maroon stool on examining finger. Chaperone Restaurant manager, fast food present Musculoskeletal:        General: No tenderness. Normal range of motion.     Cervical back:  Normal range of motion and neck supple.  Skin:    General: Skin is warm.  Neurological:     Mental Status: He is alert.     Cranial Nerves: No cranial nerve deficit.     Motor: No abnormal muscle tone.     Coordination: Coordination normal.     Comments: L sided weakness  Psychiatric:        Behavior: Behavior normal.     ED Results / Procedures / Treatments   Labs (all labs ordered are listed, but only abnormal results are displayed) Labs Reviewed  COMPREHENSIVE METABOLIC PANEL - Abnormal; Notable for the following components:      Result Value   Glucose, Bld 101 (*)    BUN 24 (*)    Total Protein 8.3 (*)    All other components within normal limits  CBC WITH DIFFERENTIAL/PLATELET - Abnormal; Notable for the following components:   RBC 3.22 (*)    Hemoglobin 9.5 (*)    HCT 30.2 (*)    RDW 15.7 (*)    All other components within normal limits  POC OCCULT BLOOD, ED - Abnormal; Notable for the following components:   Fecal Occult Bld POSITIVE (*)    All other components within normal limits  PROTIME-INR  HEMOGLOBIN AND HEMATOCRIT, BLOOD  TYPE AND SCREEN    EKG None  Radiology No results found.  Procedures Procedures    Medications Ordered in ED Medications   sodium chloride 0.9 % bolus 1,000 mL (has no administration in time range)    And  0.9 %  sodium chloride infusion (has no administration in time range)  pantoprazole (PROTONIX) injection 40 mg (has no administration in time range)    ED Course/ Medical Decision Making/ A&P                             Medical Decision Making Amount and/or Complexity of Data Reviewed Labs: ordered. Decision-making details documented in ED Course. Radiology: ordered and independent interpretation performed. Decision-making details documented in ED Course. ECG/medicine tests: ordered and independent interpretation performed. Decision-making details documented in ED Course.  Risk Prescription drug management.   Concern for rectal bleeding.  Vital signs are stable.  Abdomen is soft without peritoneal signs.  Grossly positive.  Vital signs are stable.  Hemoglobin stable at 9.5.  Patient with admitted in April with similar presentation.  At that time GI did not feel colonoscopy was necessary given his age and debilitation. It was felt his bleeding at that time was likely hemorrhoidal or diverticular.  He denies any significant abdominal pain or rectal pain.  No dizziness or lightheadedness.  CTA will be obtained for further assessment of source of his bleeding.  He is not anticoagulated and vital signs remained stable.  They be diverticular or hemorrhoidal in origin.  Also repeat hemoglobin to ensure no significant downtrend.  If CT scan and repeat hemoglobin are stable and patient remains hemodynamically stable anticipate he can return to his facility with outpatient GI follow-up. Dr. Rhunette Croft to assume care at shift change.          Final Clinical Impression(s) / ED Diagnoses Final diagnoses:  Rectal bleeding    Rx / DC Orders ED Discharge Orders     None         Merek Niu, Jeannett Senior, MD 12/06/22 [phone]

## 2022-12-06 NOTE — ED Notes (Signed)
Arranged [facility] for patient. Waiting on [facility]

## 2022-12-06 NOTE — ED Notes (Signed)
Pt is resting in bed watching tv, pt calls nurse asking for the doctor multiple times, doctor came and spoke with pt.

## 2022-12-06 NOTE — ED Notes (Signed)
Pt laying in the bed watching TV, pt made aware we are waiting on [facility].

## 2022-12-21 ENCOUNTER — Inpatient Hospital Stay (HOSPITAL_COMMUNITY)
Admission: EM | Admit: 2022-12-21 | Discharge: 2022-12-27 | DRG: 698 | Disposition: A | Discharge status: Skilled Nursing Facility | Payer: 59 | Source: Skilled Nursing Facility | Attending: Internal Medicine | Admitting: Internal Medicine

## 2022-12-21 ENCOUNTER — Encounter (HOSPITAL_COMMUNITY): Payer: Self-pay

## 2022-12-21 ENCOUNTER — Other Ambulatory Visit: Payer: Self-pay

## 2022-12-21 DIAGNOSIS — Z66 Do not resuscitate: Secondary | ICD-10-CM | POA: Diagnosis present

## 2022-12-21 DIAGNOSIS — T83511A Infection and inflammatory reaction due to indwelling urethral catheter, initial encounter: Principal | ICD-10-CM | POA: Diagnosis present

## 2022-12-21 DIAGNOSIS — R636 Underweight: Secondary | ICD-10-CM | POA: Diagnosis present

## 2022-12-21 DIAGNOSIS — R339 Retention of urine, unspecified: Secondary | ICD-10-CM | POA: Diagnosis not present

## 2022-12-21 DIAGNOSIS — Z881 Allergy status to other antibiotic agents status: Secondary | ICD-10-CM

## 2022-12-21 DIAGNOSIS — F17210 Nicotine dependence, cigarettes, uncomplicated: Secondary | ICD-10-CM | POA: Diagnosis present

## 2022-12-21 DIAGNOSIS — N401 Enlarged prostate with lower urinary tract symptoms: Secondary | ICD-10-CM | POA: Diagnosis present

## 2022-12-21 DIAGNOSIS — N1831 Chronic kidney disease, stage 3a: Secondary | ICD-10-CM | POA: Diagnosis present

## 2022-12-21 DIAGNOSIS — N138 Other obstructive and reflux uropathy: Secondary | ICD-10-CM | POA: Diagnosis present

## 2022-12-21 DIAGNOSIS — Z7982 Long term (current) use of aspirin: Secondary | ICD-10-CM

## 2022-12-21 DIAGNOSIS — R3989 Other symptoms and signs involving the genitourinary system: Secondary | ICD-10-CM | POA: Diagnosis not present

## 2022-12-21 DIAGNOSIS — Y846 Urinary catheterization as the cause of abnormal reaction of the patient, or of later complication, without mention of misadventure at the time of the procedure: Secondary | ICD-10-CM | POA: Diagnosis present

## 2022-12-21 DIAGNOSIS — I129 Hypertensive chronic kidney disease with stage 1 through stage 4 chronic kidney disease, or unspecified chronic kidney disease: Secondary | ICD-10-CM | POA: Diagnosis present

## 2022-12-21 DIAGNOSIS — M1990 Unspecified osteoarthritis, unspecified site: Secondary | ICD-10-CM | POA: Diagnosis present

## 2022-12-21 DIAGNOSIS — I10 Essential (primary) hypertension: Secondary | ICD-10-CM | POA: Diagnosis present

## 2022-12-21 DIAGNOSIS — L89522 Pressure ulcer of left ankle, stage 2: Secondary | ICD-10-CM | POA: Diagnosis present

## 2022-12-21 DIAGNOSIS — Z8673 Personal history of transient ischemic attack (TIA), and cerebral infarction without residual deficits: Secondary | ICD-10-CM | POA: Diagnosis not present

## 2022-12-21 DIAGNOSIS — F1010 Alcohol abuse, uncomplicated: Secondary | ICD-10-CM | POA: Diagnosis present

## 2022-12-21 DIAGNOSIS — I700 Atherosclerosis of aorta: Secondary | ICD-10-CM | POA: Diagnosis present

## 2022-12-21 DIAGNOSIS — E872 Acidosis, unspecified: Secondary | ICD-10-CM | POA: Diagnosis present

## 2022-12-21 DIAGNOSIS — B9561 Methicillin susceptible Staphylococcus aureus infection as the cause of diseases classified elsewhere: Secondary | ICD-10-CM | POA: Diagnosis present

## 2022-12-21 DIAGNOSIS — K219 Gastro-esophageal reflux disease without esophagitis: Secondary | ICD-10-CM | POA: Diagnosis present

## 2022-12-21 DIAGNOSIS — Z79899 Other long term (current) drug therapy: Secondary | ICD-10-CM

## 2022-12-21 DIAGNOSIS — G9341 Metabolic encephalopathy: Secondary | ICD-10-CM | POA: Diagnosis present

## 2022-12-21 DIAGNOSIS — E8809 Other disorders of plasma-protein metabolism, not elsewhere classified: Secondary | ICD-10-CM | POA: Diagnosis present

## 2022-12-21 DIAGNOSIS — Z8546 Personal history of malignant neoplasm of prostate: Secondary | ICD-10-CM

## 2022-12-21 DIAGNOSIS — Z681 Body mass index (BMI) 19 or less, adult: Secondary | ICD-10-CM | POA: Diagnosis not present

## 2022-12-21 DIAGNOSIS — D631 Anemia in chronic kidney disease: Secondary | ICD-10-CM | POA: Diagnosis present

## 2022-12-21 DIAGNOSIS — Z8042 Family history of malignant neoplasm of prostate: Secondary | ICD-10-CM

## 2022-12-21 DIAGNOSIS — F0390 Unspecified dementia without behavioral disturbance: Secondary | ICD-10-CM | POA: Diagnosis present

## 2022-12-21 DIAGNOSIS — N3001 Acute cystitis with hematuria: Secondary | ICD-10-CM

## 2022-12-21 DIAGNOSIS — D649 Anemia, unspecified: Secondary | ICD-10-CM | POA: Diagnosis not present

## 2022-12-21 DIAGNOSIS — Z22322 Carrier or suspected carrier of Methicillin resistant Staphylococcus aureus: Secondary | ICD-10-CM

## 2022-12-21 DIAGNOSIS — N179 Acute kidney failure, unspecified: Secondary | ICD-10-CM | POA: Diagnosis present

## 2022-12-21 DIAGNOSIS — R8271 Bacteriuria: Secondary | ICD-10-CM | POA: Diagnosis not present

## 2022-12-21 DIAGNOSIS — E860 Dehydration: Secondary | ICD-10-CM | POA: Diagnosis not present

## 2022-12-21 LAB — BASIC METABOLIC PANEL
Anion gap: 17 — ABNORMAL HIGH (ref 5–15)
BUN: 60 mg/dL — ABNORMAL HIGH (ref 8–23)
CO2: 20 mmol/L — ABNORMAL LOW (ref 22–32)
Calcium: 9 mg/dL (ref 8.9–10.3)
Chloride: 104 mmol/L (ref 98–111)
Creatinine, Ser: 3.97 mg/dL — ABNORMAL HIGH (ref 0.61–1.24)
GFR, Estimated: 15 mL/min — ABNORMAL LOW (ref >60)
Glucose, Bld: 138 mg/dL — ABNORMAL HIGH (ref 70–99)
Potassium: 4.4 mmol/L (ref 3.5–5.1)
Sodium: 141 mmol/L (ref 135–145)

## 2022-12-21 LAB — URINALYSIS, ROUTINE W REFLEX MICROSCOPIC
Bilirubin Urine: NEGATIVE
Glucose, UA: NEGATIVE mg/dL
Ketones, ur: NEGATIVE mg/dL
Nitrite: NEGATIVE
Protein, ur: 100 mg/dL — AB
RBC / HPF: >50 RBC/hpf (ref 0–5)
Specific Gravity, Urine: 1.016 (ref 1.005–1.030)
WBC, UA: >50 WBC/hpf (ref 0–5)
pH: 5 (ref 5.0–8.0)

## 2022-12-21 LAB — CBC WITH DIFFERENTIAL/PLATELET
Abs Immature Granulocytes: 0.02 10*3/uL (ref 0.00–0.07)
Basophils Absolute: 0 10*3/uL (ref 0.0–0.1)
Basophils Relative: 0 %
Eosinophils Absolute: 0.3 10*3/uL (ref 0.0–0.5)
Eosinophils Relative: 5 %
HCT: 29.2 % — ABNORMAL LOW (ref 39.0–52.0)
Hemoglobin: 9.3 g/dL — ABNORMAL LOW (ref 13.0–17.0)
Immature Granulocytes: 0 %
Lymphocytes Relative: 19 %
Lymphs Abs: 1.4 10*3/uL (ref 0.7–4.0)
MCH: 29.8 pg (ref 26.0–34.0)
MCHC: 31.8 g/dL (ref 30.0–36.0)
MCV: 93.6 fL (ref 80.0–100.0)
Monocytes Absolute: 0.9 10*3/uL (ref 0.1–1.0)
Monocytes Relative: 12 %
Neutro Abs: 4.6 10*3/uL (ref 1.7–7.7)
Neutrophils Relative %: 64 %
Platelets: 266 10*3/uL (ref 150–400)
RBC: 3.12 MIL/uL — ABNORMAL LOW (ref 4.22–5.81)
RDW: 16.1 % — ABNORMAL HIGH (ref 11.5–15.5)
WBC: 7.1 10*3/uL (ref 4.0–10.5)
nRBC: 0 % (ref 0.0–0.2)

## 2022-12-21 LAB — LIPASE, BLOOD: Lipase: 27 U/L (ref 11–51)

## 2022-12-21 MED ORDER — ATENOLOL 25 MG PO TABS
12.5000 mg | ORAL_TABLET | Freq: Every day | ORAL | Status: DC
  Administered 2022-12-22 – 2022-12-27 (×5): 12.5 mg via ORAL
  Filled 2022-12-21 (×6): qty 0.5

## 2022-12-21 MED ORDER — ACETAMINOPHEN 650 MG RE SUPP: 650.0000 mg | Freq: Four times a day (QID) | RECTAL | Status: DC | PRN

## 2022-12-21 MED ORDER — SENNA 8.6 MG PO TABS
1.0000 | ORAL_TABLET | Freq: Every day | ORAL | Status: DC | PRN
  Administered 2022-12-25: 8.6 mg via ORAL
  Filled 2022-12-21: qty 1

## 2022-12-21 MED ORDER — OLANZAPINE 5 MG PO TBDP
5.0000 mg | ORAL_TABLET | Freq: Every day | ORAL | Status: DC
  Administered 2022-12-21 – 2022-12-26 (×6): 5 mg via ORAL
  Filled 2022-12-21 (×7): qty 1

## 2022-12-21 MED ORDER — ASPIRIN 81 MG PO TBEC
81.0000 mg | DELAYED_RELEASE_TABLET | Freq: Every day | ORAL | Status: DC
  Administered 2022-12-22 – 2022-12-27 (×6): 81 mg via ORAL
  Filled 2022-12-21 (×6): qty 1

## 2022-12-21 MED ORDER — LEVOFLOXACIN IN D5W 500 MG/100ML IV SOLN
500.0000 mg | Freq: Once | INTRAVENOUS | Status: AC
  Administered 2022-12-21: 500 mg via INTRAVENOUS
  Filled 2022-12-21: qty 100

## 2022-12-21 MED ORDER — ACETAMINOPHEN 325 MG PO TABS
650.0000 mg | ORAL_TABLET | Freq: Four times a day (QID) | ORAL | Status: DC | PRN
  Administered 2022-12-23 – 2022-12-25 (×4): 650 mg via ORAL
  Filled 2022-12-21 (×4): qty 2

## 2022-12-21 MED ORDER — LACTATED RINGERS IV BOLUS
1000.0000 mL | Freq: Once | INTRAVENOUS | Status: AC
  Administered 2022-12-21: 1000 mL via INTRAVENOUS

## 2022-12-21 MED ORDER — OLANZAPINE 5 MG PO TABS
2.5000 mg | ORAL_TABLET | Freq: Every morning | ORAL | Status: DC
  Administered 2022-12-22 – 2022-12-27 (×6): 2.5 mg via ORAL
  Filled 2022-12-21 (×6): qty 1

## 2022-12-21 MED ORDER — DOXAZOSIN MESYLATE 1 MG PO TABS
1.0000 mg | ORAL_TABLET | Freq: Every day | ORAL | Status: DC
  Administered 2022-12-22 – 2022-12-27 (×6): 1 mg via ORAL
  Filled 2022-12-21 (×6): qty 1

## 2022-12-21 MED ORDER — LEVOFLOXACIN 500 MG PO TABS
500.0000 mg | ORAL_TABLET | ORAL | Status: DC
  Administered 2022-12-23: 500 mg via ORAL
  Filled 2022-12-21: qty 1

## 2022-12-21 MED ORDER — SODIUM CHLORIDE 0.9 % IV SOLN: INTRAVENOUS | Status: AC

## 2022-12-21 MED ORDER — LEVOFLOXACIN IN D5W 750 MG/150ML IV SOLN: 750.0000 mg | Freq: Once | INTRAVENOUS | Status: DC

## 2022-12-21 MED ORDER — ONDANSETRON HCL 4 MG PO TABS: 4.0000 mg | ORAL_TABLET | Freq: Four times a day (QID) | ORAL | Status: DC | PRN

## 2022-12-21 MED ORDER — ROSUVASTATIN CALCIUM 5 MG PO TABS
10.0000 mg | ORAL_TABLET | Freq: Every day | ORAL | Status: DC
  Administered 2022-12-22 – 2022-12-27 (×6): 10 mg via ORAL
  Filled 2022-12-21 (×6): qty 2

## 2022-12-21 MED ORDER — ONDANSETRON HCL 4 MG/2ML IJ SOLN: 4.0000 mg | Freq: Four times a day (QID) | INTRAMUSCULAR | Status: DC | PRN

## 2022-12-21 NOTE — H&P (Signed)
History and Physical    Dilan Netro WUJ:[id number] DOB: [birth year] DOA: 12/21/2022  PCP: [name], NP   Patient coming from: SNF   Chief Complaint: Gross hematuria, AMS   HPI: Jason Jones is a 72 y.o. male with medical history significant for prostate cancer, BPH, chronic Foley catheter, and history of CVA who presents from his nursing facility for evaluation of altered mental status and gross hematuria.  Patient reportedly had his Foley catheter changed at his facility yesterday, was draining only frank blood from the catheter since then, and was then noted to be less conversant, more confused, and not eating much today.  ED Course: Upon arrival to the ED, patient is found to be afebrile and saturating well on room air with normal heart rate and stable blood pressure.  Labs are most notable for BUN 60 and creatinine 3.97.  Ultrasound revealed significant urinary retention.  Foley catheter was exchanged in the ED and is now draining significant amount of non-bloody urine.  Patient was given a liter of LR.  Review of Systems:  ROS limited by patient's clinical condition.  Past Medical History:  Diagnosis Date   Acute ischemic right PCA stroke (HCC) 03/25/2022   Arthritis    Asthma    Cancer (HCC)    CAP (community acquired pneumonia) 07/20/2022   Chronic alcohol abuse    Dyspnea    GERD (gastroesophageal reflux disease)    PRN  ---  TAKES BAKING SODA IN WATER   History of pneumothorax    04-28-2006  fell, left fx rib--  resolved w/ chest tube   Hypertension    Nocturia    Poor dental hygiene    Right inguinal hernia    Weak urinary stream     Past Surgical History:  Procedure Laterality Date   COLONOSCOPY  01/08/2021   GOLD SEED IMPLANT N/A 05/23/2021   Procedure: GOLD SEED IMPLANT;  Surgeon: Rene Paci, MD;  Location: [facility];  Service: Urology;  Laterality: N/A;   INGUINAL HERNIA REPAIR Right 04/23/2017   Procedure: OPEN RIGHT INGUINAL HERNIA  REPAIR WITH MESH;  Surgeon: Kinsinger, De Blanch, MD;  Location: [facility];  Service: General;  Laterality: Right;  GENERAL COMBINED WITH REGIONAL FOR POST OP PAIN    INSERTION OF MESH Right 04/23/2017   Procedure: INSERTION OF MESH;  Surgeon: Kinsinger, De Blanch, MD;  Location: [facility];  Service: General;  Laterality: Right;  GENERAL COMBINED WITH REGIONAL FOR POST OP PAIN    LIPOMA EXCISION Right 02/22/2015   Procedure: RIGHT SHOULDER MASS EXCISION 15 CM SQ;  Surgeon: Rodman Pickle, MD;  Location: [facility];  Service: General;  Laterality: Right;   SPACE OAR INSTILLATION N/A 05/23/2021   Procedure: SPACE OAR INSTILLATION;  Surgeon: Rene Paci, MD;  Location: [facility];  Service: Urology;  Laterality: N/A;   TRANSRECTAL ULTRASOUND N/A 05/23/2021   Procedure: TRANSRECTAL ULTRASOUND;  Surgeon: Rene Paci, MD;  Location: [facility];  Service: Urology;  Laterality: N/A;    Social History:   reports that he has been smoking cigarettes. He has a 25 pack-year smoking history. He has never used smokeless tobacco. He reports that he does not currently use alcohol. He reports that he does not currently use drugs after having used the following drugs: Marijuana.  Allergies  Allergen Reactions   Ceftriaxone Swelling and Other (See Comments)    Possible lips swelling (dose 07/21/22) "Allergic," per facility   Doxycycline Other (See  Comments)    "ALLERGIC," per facility document    Family History  Problem Relation Age of Onset   Prostate cancer Cousin    Prostate cancer Cousin      Prior to Admission medications   Medication Sig Start Date End Date Taking? Authorizing Provider  acetaminophen (TYLENOL) 500 MG tablet Take 2 tablets (1,000 mg total) by mouth every 8 (eight) hours as needed for mild pain, headache or moderate pain. 08/05/22   Alwyn Ren, MD  albuterol (PROVENTIL) (2.5 MG/3ML) 0.083% nebulizer  solution Take 3 mLs (2.5 mg total) by nebulization every 4 (four) hours as needed for wheezing or shortness of breath. Patient taking differently: Take 2.5 mg by nebulization in the morning and at bedtime. 03/28/22   Marrianne Mood, MD  aspirin 81 MG chewable tablet Chew 1 tablet (81 mg total) by mouth daily. Patient taking differently: Chew 81 mg by mouth in the morning. 03/29/22   Marrianne Mood, MD  atenolol (TENORMIN) 25 MG tablet Take 12.5 mg by mouth in the morning.    [provider]  b complex vitamins capsule Take 1 capsule by mouth in the morning.    [provider]  baclofen (LIORESAL) 10 MG tablet Take 10 mg by mouth See admin instructions. Take 10 mg by mouth in the morning and at 5 PM    [provider]  CALCIUM ALGINATE EX Apply 1 application  topically See admin instructions. Calcium Alginate-Silver external pad 4"x 5". Apply to left ankle and foot topically every day shift every other day for wound healing.    [provider]  doxazosin (CARDURA) 1 MG tablet Take 1 mg by mouth in the morning.    [provider]  Emollient (AQUAPHOR OINTMENT BODY EX) Apply 1 application  topically See admin instructions. Apply to the lower legs and feet every shift    [provider]  feeding supplement (ENSURE ENLIVE / ENSURE PLUS) LIQD Take 237 mLs by mouth 2 (two) times daily between meals. 03/28/22   Marrianne Mood, MD  ferrous gluconate (FERGON) 324 MG tablet Take 324 mg by mouth daily with breakfast. 07/02/22 08/31/22  [provider]  gabapentin (NEURONTIN) 100 MG capsule Take 200-300 mg by mouth See admin instructions. Take 200 mg by mouth in the morning, 300 mg at 5 PM, and 300 mg at bedtime    [provider]  leptospermum manuka honey (MEDIHONEY) PSTE paste Apply 1 Application topically daily. 07/24/22   Calvert Cantor, MD  mirtazapine (REMERON) 7.5 MG tablet Take 7.5 mg by mouth at bedtime.    [provider]  NON FORMULARY Take 120 mLs by mouth See admin instructions. MedPass 2.0 - Drink 120 ml's by mouth three times a day    [provider]  OLANZapine (ZYPREXA) 2.5 MG tablet Take 1 tablet (2.5 mg total) by mouth every morning. 08/05/22 08/05/23  Alwyn Ren, MD  OLANZapine zydis (ZYPREXA) 5 MG disintegrating tablet Take 1 tablet (5 mg total) by mouth at bedtime. 08/05/22   Alwyn Ren, MD  polyethylene glycol (MIRALAX / GLYCOLAX) 17 g packet Take 17 g by mouth daily as needed. Patient taking differently: Take 17 g by mouth daily as needed (for constipation). 04/16/22   Kathlen Mody, MD  rosuvastatin (CRESTOR) 20 MG tablet Take 1 tablet (20 mg total) by mouth daily. Patient taking differently: Take 20 mg by mouth at bedtime. 03/29/22   Marrianne Mood, MD  senna-docusate (SENOKOT-S) 8.6-50 MG tablet Take 1  tablet by mouth at bedtime.    [provider]  sodium chloride (OCEAN) 0.65 % SOLN nasal spray Place 1 spray into both nostrils as needed for congestion. 04/16/22   Kathlen Mody, MD  triamcinolone cream (KENALOG) 0.1 % Apply 1 Application topically See admin instructions. Apply to rashes every day and evening shift- right arm, right thigh, left thigh, and any other affected areas    [provider]    Physical Exam: Vitals:   12/21/22 1943 12/21/22 1944  BP:  128/78  Pulse:  61  Resp:  16  Temp:  97.9 F (36.6 C)  TempSrc:  Oral  SpO2: 95% 97%    Constitutional: NAD, cachectic   Eyes: PERTLA, lids and conjunctivae normal ENMT: Mucous membranes are moist. Posterior pharynx clear of any exudate or lesions.   Neck: supple, no masses  Respiratory: no wheezing, no crackles. No accessory muscle use.  Cardiovascular: S1 & S2 heard, regular rate and rhythm. No extremity edema.   Abdomen: No distension, no tenderness, soft. Bowel sounds active.  Musculoskeletal: no clubbing / cyanosis. Contracted on left.   Skin: no significant rashes, lesions,  ulcers. Warm, dry, well-perfused. Neurologic: Somnolent. Wakes to voice. Unintelligible speech.    Labs and Imaging on Admission: I have personally reviewed following labs and imaging studies  CBC: Recent Labs  Lab 12/21/22 2021  WBC 7.1  NEUTROABS 4.6  HGB 9.3*  HCT 29.2*  MCV 93.6  PLT 266   Basic Metabolic Panel: Recent Labs  Lab 12/21/22 2021  NA 141  K 4.4  CL 104  CO2 20*  GLUCOSE 138*  BUN 60*  CREATININE 3.97*  CALCIUM 9.0   GFR: CrCl cannot be calculated (Unknown ideal weight.). Liver Function Tests: No results for input(s): "AST", "ALT", "ALKPHOS", "BILITOT", "PROT", "ALBUMIN" in the last 168 hours. Recent Labs  Lab 12/21/22 2021  LIPASE 27   No results for input(s): "AMMONIA" in the last 168 hours. Coagulation Profile: No results for input(s): "INR", "PROTIME" in the last 168 hours. Cardiac Enzymes: No results for input(s): "CKTOTAL", "CKMB", "CKMBINDEX", "TROPONINI" in the last 168 hours. BNP (last 3 results) No results for input(s): "PROBNP" in the last 8760 hours. HbA1C: No results for input(s): "HGBA1C" in the last 72 hours. CBG: No results for input(s): "GLUCAP" in the last 168 hours. Lipid Profile: No results for input(s): "CHOL", "HDL", "LDLCALC", "TRIG", "CHOLHDL", "LDLDIRECT" in the last 72 hours. Thyroid Function Tests: No results for input(s): "TSH", "T4TOTAL", "FREET4", "T3FREE", "THYROIDAB" in the last 72 hours. Anemia Panel: No results for input(s): "VITAMINB12", "FOLATE", "FERRITIN", "TIBC", "IRON", "RETICCTPCT" in the last 72 hours. Urine analysis:    Component Value Date/Time   COLORURINE YELLOW (A) 12/21/2022 2021   APPEARANCEUR TURBID (A) 12/21/2022 2021   LABSPEC 1.016 12/21/2022 2021   PHURINE 5.0 12/21/2022 2021   GLUCOSEU NEGATIVE 12/21/2022 2021   HGBUR LARGE (A) 12/21/2022 2021   BILIRUBINUR NEGATIVE 12/21/2022 2021   KETONESUR NEGATIVE 12/21/2022 2021   PROTEINUR 100 (A) 12/21/2022 2021   UROBILINOGEN 0.2  03/16/2008 0733   NITRITE NEGATIVE 12/21/2022 2021   LEUKOCYTESUR MODERATE (A) 12/21/2022 2021   Sepsis Labs: @LABRCNTIP (procalcitonin:4,lacticidven:4) )No results found for this or any previous visit (from the past 240 hour(s)).   Radiological Exams on Admission: No results found.  EKG: Independently reviewed. Sinus rhythm.   Assessment/Plan   1. 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 Hx of CVA  - Continue ASA and statin   3. Dementia  - Use delirium precautions, continue Zyprexa    4. Hypertension  - Continue atenolol    5. Acute metabolic encephalopathy  - Likely d/t acute urinary retention (now relieved) and/or azotemia  - Continue Foley catheter, treat AKI as above, use delirium precautions, expand workup if fails to resolve as expected    6. Anemia  - Appears stable     DVT prophylaxis: SCDs  Code Status: DNR, paperwork at bedside   Level of Care: Level of care: Med-Surg Family Communication: None present  Disposition Plan:  Patient is from: SNF  Anticipated d/c is to: SNF  Anticipated d/c date is: 12/24/22  Patient currently: Pending improved renal function  Consults called: None  Admission status: Inpatient     Briscoe Deutscher, MD [facility]  12/21/2022, 10:17 PM

## 2022-12-21 NOTE — ED Notes (Addendum)
ED TO INPATIENT HANDOFF REPORT  ED Nurse Name and Phone #:  Johnna Acosta [phone]  S Name/Age/Gender Jason Jones 72 y.o. male Room/Bed: 020C/020C  Code Status   Code Status: Prior  Home/SNF/Other Skilled nursing facility Patient oriented to: self Is this baseline? Yes   Triage Complete: Triage complete  Chief Complaint Acute renal failure superimposed on stage 3a chronic kidney disease (HCC) [N17.9, N18.31]  Triage Note No notes on file   Allergies Allergies  Allergen Reactions   Ceftriaxone Swelling and Other (See Comments)    Possible lips swelling (dose 07/21/22) "Allergic," per facility   Doxycycline Other (See Comments)    "ALLERGIC," per facility document    Level of Care/Admitting Diagnosis ED Disposition     ED Disposition  Admit   Condition  --   Comment  H[facility] [[id number]]  Level of Care: Med-Surg [16]  May admit patient to [facility] or [facility] if equivalent level of care is available:: No  Covid Evaluation: Asymptomatic - no recent exposure (last 10 days) testing not required  Diagnosis: Acute renal failure superimposed on stage 3a chronic kidney disease [facility]) [[id number]]  Admitting Physician: Briscoe Deutscher [[id number]]  Attending Physician: Briscoe Deutscher [[id number]]  Certification:: I certify this patient will need inpatient services for at least 2 midnights  Estimated Length of Stay: 3          B Medical/Surgery History Past Medical History:  Diagnosis Date   Acute ischemic right PCA stroke (HCC) 03/25/2022   Arthritis    Asthma    Cancer (HCC)    CAP (community acquired pneumonia) 07/20/2022   Chronic alcohol abuse    Dyspnea    GERD (gastroesophageal reflux disease)    PRN  ---  TAKES BAKING SODA IN WATER   History of pneumothorax    04-28-2006  fell, left fx rib--  resolved w/ chest tube   Hypertension    Nocturia    Poor dental hygiene    Right inguinal hernia    Weak urinary stream     Past Surgical History:  Procedure Laterality Date   COLONOSCOPY  01/08/2021   GOLD SEED IMPLANT N/A 05/23/2021   Procedure: GOLD SEED IMPLANT;  Surgeon: Rene Paci, MD;  Location: [facility];  Service: Urology;  Laterality: N/A;   INGUINAL HERNIA REPAIR Right 04/23/2017   Procedure: OPEN RIGHT INGUINAL HERNIA REPAIR WITH MESH;  Surgeon: Kinsinger, De Blanch, MD;  Location: [facility];  Service: General;  Laterality: Right;  GENERAL COMBINED WITH REGIONAL FOR POST OP PAIN    INSERTION OF MESH Right 04/23/2017   Procedure: INSERTION OF MESH;  Surgeon: Kinsinger, De Blanch, MD;  Location: [facility];  Service: General;  Laterality: Right;  GENERAL COMBINED WITH REGIONAL FOR POST OP PAIN    LIPOMA EXCISION Right 02/22/2015   Procedure: RIGHT SHOULDER MASS EXCISION 15 CM SQ;  Surgeon: Rodman Pickle, MD;  Location: [facility];  Service: General;  Laterality: Right;   SPACE OAR INSTILLATION N/A 05/23/2021   Procedure: SPACE OAR INSTILLATION;  Surgeon: Rene Paci, MD;  Location: [facility];  Service: Urology;  Laterality: N/A;   TRANSRECTAL ULTRASOUND N/A 05/23/2021   Procedure: TRANSRECTAL ULTRASOUND;  Surgeon: Rene Paci, MD;  Location: [facility];  Service: Urology;  Laterality: N/A;     A IV Location/Drains/Wounds Patient Lines/Drains/Airways Status     Active Line/Drains/Airways     Name Placement date Placement time Site  Days   Urethral Catheter Akshara Blumenthal RN Straight-tip 14 Fr. 12/21/22  2012  Straight-tip  less than 1   Pressure Injury 07/20/22 Ankle Anterior;Left Unstageable - Full thickness tissue loss in which the base of the injury is covered by slough (yellow, tan, gray, green or brown) and/or eschar (tan, brown or black) in the wound bed. 07/20/22  1809  -- 154   Pressure Injury 07/20/22 Foot Anterior;Left Stage 3 -  Full thickness tissue loss. Subcutaneous fat may be visible but bone, tendon  or muscle are NOT exposed. 07/20/22  1810  -- 154            Intake/Output Last 24 hours No intake or output data in the 24 hours ending 12/21/22 2206  Labs/Imaging Results for orders placed or performed during the hospital encounter of 12/21/22 (from the past 48 hour(s))  CBC with Differential     Status: Abnormal   Collection Time: 12/21/22  8:21 PM  Result Value Ref Range   WBC 7.1 4.0 - 10.5 K/uL   RBC 3.12 (L) 4.22 - 5.81 MIL/uL   Hemoglobin 9.3 (L) 13.0 - 17.0 g/dL   HCT 03.4 (L) 74.2 - 59.5 %   MCV 93.6 80.0 - 100.0 fL   MCH 29.8 26.0 - 34.0 pg   MCHC 31.8 30.0 - 36.0 g/dL   RDW 63.8 (H) 75.6 - 43.3 %   Platelets 266 150 - 400 K/uL   nRBC 0.0 0.0 - 0.2 %   Neutrophils Relative % 64 %   Neutro Abs 4.6 1.7 - 7.7 K/uL   Lymphocytes Relative 19 %   Lymphs Abs 1.4 0.7 - 4.0 K/uL   Monocytes Relative 12 %   Monocytes Absolute 0.9 0.1 - 1.0 K/uL   Eosinophils Relative 5 %   Eosinophils Absolute 0.3 0.0 - 0.5 K/uL   Basophils Relative 0 %   Basophils Absolute 0.0 0.0 - 0.1 K/uL   Immature Granulocytes 0 %   Abs Immature Granulocytes 0.02 0.00 - 0.07 K/uL    Comment: Performed at [facility], [address], [place], [place] [address]  Basic metabolic panel     Status: Abnormal   Collection Time: 12/21/22  8:21 PM  Result Value Ref Range   Sodium 141 135 - 145 mmol/L   Potassium 4.4 3.5 - 5.1 mmol/L   Chloride 104 98 - 111 mmol/L   CO2 20 (L) 22 - 32 mmol/L   Glucose, Bld 138 (H) 70 - 99 mg/dL    Comment: Glucose reference range applies only to samples taken after fasting for at least 8 hours.   BUN 60 (H) 8 - 23 mg/dL   Creatinine, Ser 8.41 (H) 0.61 - 1.24 mg/dL   Calcium 9.0 8.9 - 66.0 mg/dL   GFR, Estimated 15 (L) >60 mL/min    Comment: (NOTE) Calculated using the CKD-EPI Creatinine Equation (2021)    Anion gap 17 (H) 5 - 15    Comment: Performed at [facility], [address], [place], [place] [address]  Lipase, blood     Status: None    Collection Time: 12/21/22  8:21 PM  Result Value Ref Range   Lipase 27 11 - 51 U/L    Comment: Performed at [facility], [address] [place], [place] [address]  Urinalysis, Routine w reflex microscopic -Urine, Clean Catch     Status: Abnormal   Collection Time: 12/21/22  8:21 PM  Result Value Ref Range   Color,  Urine YELLOW (A) YELLOW   APPearance TURBID (A) CLEAR   Specific Gravity, Urine 1.016 1.005 - 1.030   pH 5.0 5.0 - 8.0   Glucose, UA NEGATIVE NEGATIVE mg/dL   Hgb urine dipstick LARGE (A) NEGATIVE   Bilirubin Urine NEGATIVE NEGATIVE   Ketones, ur NEGATIVE NEGATIVE mg/dL   Protein, ur 161 (A) NEGATIVE mg/dL   Nitrite NEGATIVE NEGATIVE   Leukocytes,Ua MODERATE (A) NEGATIVE   RBC / HPF >50 0 - 5 RBC/hpf   WBC, UA >50 0 - 5 WBC/hpf   Bacteria, UA MANY (A) NONE SEEN   Squamous Epithelial / HPF 0-5 0 - 5 /HPF   WBC Clumps PRESENT    Hyaline Casts, UA PRESENT     Comment: Performed at [facility], [address], [place], [place] [address]   No results found.  Pending Labs Unresulted Labs (From admission, onward)    None       Vitals/Pain Today's Vitals   12/21/22 1943 12/21/22 1944  BP:  128/78  Pulse:  61  Resp:  16  Temp:  97.9 F (36.6 C)  TempSrc:  Oral  SpO2: 95% 97%    Isolation Precautions No active isolations  Medications Medications  lactated ringers bolus 1,000 mL (has no administration in time range)    Mobility non-ambulatory     Focused Assessments Renal Assessment Handoff:  Hemodialysis Schedule:  Last Hemodialysis date and time:    Restricted appendage:     R Recommendations: See Admitting Provider Note  Report given to:   Additional Notes:  Penis is wrapped in coban. Is to stay that way overnight

## 2022-12-21 NOTE — ED Notes (Signed)
On removal of foley catheter inserted at SNF, balloon noted to be in pt's penis. Significant amount of blood and large clots noted from pt's penis. Small amount of active bleeding noted. Per MD request new foley catheter inserted, urine noted to be yellow. Pressure held on penis. MD at bedside, placed gauze and coban around base of penis. Large clot noted at the head of the penis. Per MD, leave clot in place and wrap until the AM.

## 2022-12-21 NOTE — ED Provider Notes (Signed)
[place] [facility] DE[facility]MENT AT [facility] Provider Note   CSN: [id number] Arrival date & time: 12/21/22  1938     History Chief Complaint  Patient presents with   Altered Mental Status    Per EMS, RN at facility changed pt's foley today and noticed that pt was pulling on it which is not his baseline. Per RN after pt seemed somewhat "aggressive with it" she noticed some blood in the catheter bag. Per RN, pt also has had poor oral intake today    HPI Dirk Tessmann is a 72 y.o. male presenting for chief complaint of AMS. Extensive medical history lives at a skilled nursing facility.  History of strokes.  Patient has no concerns at this time.  However per the facility he has had decrease conversational is him where he is normally fully verbal.  He has not been making much sense today. Additionally they state they went to replace his catheter today and it was very difficult and only frank blood has been expressed since placement of the Foley.  Patient's recorded medical, surgical, social, medication list and allergies were reviewed in the Snapshot window as part of the initial history.   Review of Systems   Review of Systems  Constitutional:  Negative for chills and fever.  HENT:  Negative for ear pain and sore throat.   Eyes:  Negative for pain and visual disturbance.  Respiratory:  Negative for cough and shortness of breath.   Cardiovascular:  Negative for chest pain and palpitations.  Gastrointestinal:  Negative for abdominal pain and vomiting.  Genitourinary:  Negative for dysuria and hematuria.  Musculoskeletal:  Negative for arthralgias and back pain.  Skin:  Negative for color change and rash.  Neurological:  Negative for seizures and syncope.  All other systems reviewed and are negative.   Physical Exam Updated Vital Signs BP 128/78 (BP Location: Right Arm)   Pulse 61   Temp 97.9 F (36.6 C) (Oral)   Resp 16   SpO2 97%  Physical Exam Vitals and nursing  note reviewed.  Constitutional:      General: He is not in acute distress.    Appearance: He is well-developed.  HENT:     Head: Normocephalic and atraumatic.  Eyes:     Conjunctiva/sclera: Conjunctivae normal.  Cardiovascular:     Rate and Rhythm: Normal rate and regular rhythm.     Heart sounds: No murmur heard. Pulmonary:     Effort: Pulmonary effort is normal. No respiratory distress.     Breath sounds: Normal breath sounds.  Abdominal:     General: There is distension.     Palpations: Abdomen is soft.     Tenderness: There is abdominal tenderness.  Genitourinary:    Comments: Substantial blood at the urethral meatus Musculoskeletal:        General: No swelling.     Cervical back: Neck supple.  Skin:    General: Skin is warm and dry.     Capillary Refill: Capillary refill takes less than 2 seconds.  Neurological:     Mental Status: He is alert.  Psychiatric:        Mood and Affect: Mood normal.      ED Course/ Medical Decision Making/ A&P    Procedures Ultrasound ED Renal  Date/Time: 12/21/2022 9:46 PM  Performed by: Glyn Ade, MD Authorized by: Glyn Ade, MD   Procedure details:    Indications: urinary retention     Technique:  BladderImages: archivedStudy Limitations: body  habitus Bladder findings:    Bladder:  Visualized   Volume:  >500 Comments:     Patient with substantial blood product visible in the posterior aspect of his bladder.  No Foley visible in bladder.    Medications Ordered in ED Medications  lactated ringers bolus 1,000 mL (has no administration in time range)    Medical Decision Making:   This is a patient presented with a chief complaint of blood at his urethral meatus and also altered mental status.  Sent in from his skilled nursing facility for degree of altered mentation. He has a tender abdominal exam.  Concern based on ultrasound for failure to place Foley into bladder.  Nursing was able to replace Foley and  drained approximately 300 cc of clear liquid urine.  He had substantial blood product coming out of his urethral meatus. Pressure dressing placed and bleeding stopped. Screening labs performed to evaluate for the altered mentation/decreased verbosity. No acute evidence of infection on screening evaluation but he does have substantial AKI.  Suspect this is from his obstruction.  He will need evaluation and serial testing for further diagnostic care and management.  Given presence of altered mental status/uremia, gentle IV hydration was initiated and patient was arranged for admission for further care and management.  Disposition:   Based on the above findings, I believe this patient is stable for admission.    Patient/family educated about specific findings on our evaluation and explained exact reasons for admission.  Patient/family educated about clinical situation and time was allowed to answer questions.   Admission team communicated with and agreed with need for admission. Patient admitted. Patient  ready to move at this time.     Emergency Department Medication Summary:   Medications  lactated ringers bolus 1,000 mL (has no administration in time range)        Clinical Impression:  1. Dehydration   2. AKI (acute kidney injury) (HCC)      Data Unavailable   Final Clinical Impression(s) / ED Diagnoses Final diagnoses:  Dehydration  AKI (acute kidney injury) [facility])    Rx / DC Orders ED Discharge Orders     None         Glyn Ade, MD 12/21/22 2303

## 2022-12-22 DIAGNOSIS — Z8673 Personal history of transient ischemic attack (TIA), and cerebral infarction without residual deficits: Secondary | ICD-10-CM | POA: Diagnosis not present

## 2022-12-22 DIAGNOSIS — N179 Acute kidney failure, unspecified: Secondary | ICD-10-CM | POA: Diagnosis not present

## 2022-12-22 DIAGNOSIS — D649 Anemia, unspecified: Secondary | ICD-10-CM | POA: Diagnosis not present

## 2022-12-22 DIAGNOSIS — R3989 Other symptoms and signs involving the genitourinary system: Secondary | ICD-10-CM

## 2022-12-22 DIAGNOSIS — G9341 Metabolic encephalopathy: Secondary | ICD-10-CM | POA: Diagnosis not present

## 2022-12-22 LAB — BASIC METABOLIC PANEL
Anion gap: 10 (ref 5–15)
BUN: 52 mg/dL — ABNORMAL HIGH (ref 8–23)
CO2: 21 mmol/L — ABNORMAL LOW (ref 22–32)
Calcium: 8.6 mg/dL — ABNORMAL LOW (ref 8.9–10.3)
Chloride: 108 mmol/L (ref 98–111)
Creatinine, Ser: 3 mg/dL — ABNORMAL HIGH (ref 0.61–1.24)
GFR, Estimated: 21 mL/min — ABNORMAL LOW (ref >60)
Glucose, Bld: 115 mg/dL — ABNORMAL HIGH (ref 70–99)
Potassium: 3.9 mmol/L (ref 3.5–5.1)
Sodium: 139 mmol/L (ref 135–145)

## 2022-12-22 LAB — CBC
HCT: 27 % — ABNORMAL LOW (ref 39.0–52.0)
Hemoglobin: 8.6 g/dL — ABNORMAL LOW (ref 13.0–17.0)
MCH: 29.5 pg (ref 26.0–34.0)
MCHC: 31.9 g/dL (ref 30.0–36.0)
MCV: 92.5 fL (ref 80.0–100.0)
Platelets: 249 10*3/uL (ref 150–400)
RBC: 2.92 MIL/uL — ABNORMAL LOW (ref 4.22–5.81)
RDW: 15.9 % — ABNORMAL HIGH (ref 11.5–15.5)
WBC: 7.9 10*3/uL (ref 4.0–10.5)
nRBC: 0 % (ref 0.0–0.2)

## 2022-12-22 MED ORDER — CHLORHEXIDINE GLUCONATE CLOTH 2 % EX PADS
6.0000 | MEDICATED_PAD | Freq: Every day | CUTANEOUS | Status: DC
  Administered 2022-12-23 – 2022-12-27 (×6): 6 via TOPICAL

## 2022-12-22 NOTE — Progress Notes (Signed)
New Admission Note:    Arrival Method: ED stretcher Mental Orientation:  a/ox1 Telemetry: n/a Assessment:completed Skin: intact IV: right forearm Pain: n/a Tubes: foley catheter Safety Measures: nonskid socks, bed alarm Admission: completed Orientation: Patient has been oriented to the room, unit and staff.  Family: n/a Belongings: n/a  Orders have been reviewed and implemented. Will continue to monitor the patient. Call light has been placed within reach and bed alarm has been activated.   Fabian Sharp BSN, RN-BC Phone number: [phone]

## 2022-12-22 NOTE — Plan of Care (Signed)

## 2022-12-22 NOTE — Hospital Course (Addendum)
Patient is a 72 year old chronically ill-appearing elderly AAM with a past medical history significant for benign to prostate cancer, BPH, chronic Foley catheter, history of CVA as well as other comorbidities who presented from his nursing facility for the evaluation of altered mental status and gross hematuria.  Reportedly had his Foley catheter changed at this facility yesterday and was draining only frank blood from the catheter since then and was noted to be less conversant, more confused and not eating as much.  Given his symptoms he was sent to the ED for further evaluation he was found to be afebrile and saturating well on room air.  Labs were notable for a BUN of 60 and a creatinine of 3.97.  Ultrasound showed and revealed significant urinary retention.  Foley catheter was exchanged in the ED and now draining a significant amount of nonbloody urine.  There is concern for a urinary tract infection so he is initiated on Levaquin and is given IV fluid hydration.  Urine Cx showing GNR and Staph Aureus with Sensitivities pending. He is slowly improving. Anticipating D/C back to his facility in 24-48 hours pending Cx and further Renal Improvement.   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 GNR and Staphylococcus Aureus UTI   Elevated Anion Gap Metabolic Acidosis -BUN is 60 and SCr 3.97 on admission, up from 24 & 1.08 two wks ago  -Likely d/t obstruction; Foley changed in ED and now draining purulent but non-bloody urine  -He has bacteriuria and pyuria; urine will be cultured and antibiotics given; U/A done and showed a turbid appearance with yellow color urine, negative glucose, large hemoglobin, moderate leukocytes, negative nitrites, 100 protein, many bacteria, greater than 50 RBCs per high-power field, greater than 50 WBCs -Patient's urine culture still pending -Patient was placed on levofloxacin in the ED and has been continued and will continue for now pending  urine culture results -Urine Cx showing: Culture  Abnormal  >=100,000 COLONIES/mL STAPHYLOCOCCUS AUREUS >=100,000 COLONIES/mL GRAM NEGATIVE RODS SUSCEPTIBILITIES TO FOLLOW Performed at [facility], [address] [place], [place] [address]  -Recent CT Angio GI Bleed scan showed "Continued severe diffuse wall thickening of urinary bladder is noted which may be due to history of chronic bladder outlet obstruction, although cystitis cannot be excluded. Urinary bladder is decompressed secondary to Foley catheter. Stable prostatic enlargement."  -BUN/Cr Trend: Recent Labs  Lab 12/06/22 0551 12/21/22 2021 12/22/22 0221 12/23/22 0124  BUN 24* 60* 52* 37*  CREATININE 1.08 3.97* 3.00* 1.68*  -Has a slight Metabolic Acidosis with a CO2 of 20, AG of 7, Chloride Level of 116 -Continued IV fluid hydration with normal saline at 125 MLS per hour for 20 hours -Avoid Nephrotoxic Medications, Contrast Dyes, Hypotension and Dehydration to Ensure Adequate Renal Perfusion and will need to Renally Adjust Meds -Continue to Monitor and Trend Renal Function carefully and repeat CMP in the AM   Hx of CVA  -Continue ASA 81 mg po Daily and Rosuvastatin 10 mg po Daily for now   Dementia  -Use Delirium precautions,  -Continue Olanzapine 2.5 mg po Daily and 5 mg po qHS   Hypertension  -Continue Atenolol 12.5 mg po Daily and  -Continue to Monitor BP per Protocol -Last BP reading was on the softer side at 98/59   Acute Metabolic Encephalopathy, improving  -Likely d/t acute urinary retention (now relieved) and/or azotemia; Still confused -Continue Foley catheter, treat AKI as above,  -Continue Delirium precautions -Will expand workup if  fails to resolve as expected and obtain Head Imaging, TSH, RPR, B12 and Ammonia Level   Normocytic Anemia  -Appears relatively stable; recently had an FOBT positive and had a CT angio GI bleed question and showed "No definite evidence active gastrointestinal  hemorrhage. Moderate focal stenosis seen involving the proximal right superficial femoral artery. Severe focal stenosis seen involving proximal left superficial femoral artery. Aortic Atherosclerosis" -Hgb/Hct Trend: Recent Labs  Lab 12/06/22 0551 12/21/22 2021 12/22/22 0221 12/23/22 0124  HGB 9.5* 9.3* 8.6* 7.8*  HCT 30.2* 29.2* 27.0* 25.6*  MCV 93.8 93.6 92.5 95.5  -Checked Anemia Panel iron level of 14, UIBC 161, TIBC 175, saturation ratio of 8%, ferritin level 210, folate level greater than 40, vitamin B12 level 440 -Continue to Monitor for S/Sx of Bleeding; No overt Bleeding noted -Repeat CBC in the AM  Hypoalbuminemia -Patient's Albumin Trend: Recent Labs  Lab 12/06/22 0551 12/23/22 0124  ALBUMIN 3.6 2.7*  -Continue to Monitor and Trend and repeat CMP in the AM  Underweight -Complicates overall prognosis and care -Estimated body mass index is 17.1 kg/m as calculated from the following:   Height as of 11/14/22: 5\' 11"  (1.803 m).   Weight as of this encounter: 55.6 kg.  -Will obtain Nutrition Consultation

## 2022-12-22 NOTE — Progress Notes (Addendum)
PROGRESS NOTE    Jason Jones  ZOX:[id number] DOB: 06-16-1950 DOA: 12/21/2022 PCP: [name], NP   Brief Narrative:  Patient is a 72 year old chronically ill-appearing elderly AAM with a past medical history significant for benign to prostate cancer, BPH, chronic Foley catheter, history of CVA as well as other comorbidities who presented from his nursing facility for the evaluation of altered mental status and gross hematuria.  Reportedly had his Foley catheter changed at this facility yesterday and was draining only frank blood from the catheter since then and was noted to be less conversant, more confused and not eating as much.  Given his symptoms he was sent to the ED for further evaluation he was found to be afebrile and saturating well on room air.  Labs were notable for a BUN of 60 and a creatinine of 3.97.  Ultrasound showed and revealed significant urinary retention.  Foley catheter was exchanged in the ED and now draining a significant amount of nonbloody urine.  There is concern for a urinary tract infection so he is initiated on Levaquin and is given IV fluid hydration.  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 Suspected UTI   Elevated Anion Gap Metabolic Acidosis -BUN is 60 and SCr 3.97 on admission, up from 24 & 1.08 two wks ago  -Likely d/t obstruction; Foley changed in ED and now draining purulent but non-bloody urine  -He has bacteriuria and pyuria; urine will be cultured and antibiotics given; U/A done and showed a turbid appearance with yellow color urine, negative glucose, large hemoglobin, moderate leukocytes, negative nitrites, 100 protein, many bacteria, greater than 50 RBCs per high-power field, greater than 50 WBCs -Patient's urine culture still pending -Patient was placed on levofloxacin in the ED and has been continued and will continue for now pending urine culture results -Recent CT Angio GI Bleed scan showed "Continued severe  diffuse wall thickening of urinary bladder is noted which may be due to history of chronic bladder outlet obstruction, although cystitis cannot be excluded. Urinary bladder is decompressed secondary to Foley catheter. Stable prostatic enlargement."  -BUN/Cr Trend: Recent Labs  Lab 12/06/22 0551 12/21/22 2021 12/22/22 0221  BUN 24* 60* 52*  CREATININE 1.08 3.97* 3.00*  -Has a Metabolic Acidosis with a CO2 of 21, AG of 10, Chloride Level of 108 -Continue IV fluid hydration with normal saline at 125 MLS per hour for 20 hours -Avoid Nephrotoxic Medications, Contrast Dyes, Hypotension and Dehydration to Ensure Adequate Renal Perfusion and will need to Renally Adjust Meds -Continue to Monitor and Trend Renal Function carefully and repeat CMP in the AM   Hx of CVA  -Continue ASA 81 mg po Daily and Rosuvastatin 10 mg po Daily for now   Dementia  -Use Delirium precautions,  -Continue Olanzapine 2.5 mg po Daily and 5 mg po qHS   Hypertension  -Continue Atenolol 12.5 mg po Daily and  -Continue to Monitor BP per Protocol -Last BP reading was 118/61   Acute Metabolic encephalopathy  -Likely d/t acute urinary retention (now relieved) and/or azotemia; Still confused -Continue Foley catheter, treat AKI as above,  -Continue Delirium precautions -Will expand workup if fails to resolve as expected and obtain Head Imaging, TSH, RPR, B12 and Ammonia Level   Normocytic Anemia  -Appears relatively stable; recently had an FOBT positive and had a CT angio GI bleed question and showed "No definite evidence active gastrointestinal hemorrhage. Moderate focal stenosis seen involving the proximal right superficial femoral artery. Severe  focal stenosis seen involving proximal left superficial femoral artery. Aortic Atherosclerosis" -Hgb/Hct Trend: Recent Labs  Lab 12/06/22 0551 12/21/22 2021 12/22/22 0221  HGB 9.5* 9.3* 8.6*  HCT 30.2* 29.2* 27.0*  MCV 93.8 93.6 92.5  -Check Anemia Panel in the  AM -Continue to Monitor for S/Sx of Bleeding; No overt Bleeding noted -Repeat CBC in the AM  Underweight -Complicates overall prognosis and care -Estimated body mass index is 17.1 kg/m as calculated from the following:   Height as of 11/14/22: 5\' 11"  (1.803 m).   Weight as of this encounter: 55.6 kg.  -Will obtain Nutrition Consultation      DVT prophylaxis: SCDs Start: 12/21/22 2213    Code Status: DNR Family Communication: No family present at bedside  Disposition Plan:  Level of care: Med-Surg Status is: Inpatient Remains inpatient appropriate because: Needs further clinical improvement in his renal status and treatment for suspected UTI   Consultants:  None  Procedures:  As delineated as above  Antimicrobials:  Anti-infectives (From admission, onward)    Start     Dose/Rate Route Frequency Ordered Stop   12/23/22 2230  levofloxacin (LEVAQUIN) tablet 500 mg        500 mg Oral Every 48 hours 12/21/22 2217     12/21/22 2230  levofloxacin (LEVAQUIN) IVPB 500 mg        500 mg 100 mL/hr over 60 Minutes Intravenous  Once 12/21/22 2228 12/22/22 0109   12/21/22 2215  levofloxacin (LEVAQUIN) IVPB 750 mg  Status:  Discontinued        750 mg 100 mL/hr over 90 Minutes Intravenous  Once 12/21/22 2217 12/21/22 2227       Subjective: Examined at bedside he still remains confused.  Catheter is draining bloody urine now.  He feels okay.  No other concerns or complaints at this time.  Objective: Vitals:   12/22/22 0000 12/22/22 0300 12/22/22 0439 12/22/22 0854  BP: (!) 116/58  121/60 118/61  Pulse: 66  61   Resp: 16  16 18   Temp: 98.5 F (36.9 C)  98.5 F (36.9 C) 97.8 F (36.6 C)  TempSrc: Oral  Oral Axillary  SpO2: 99%  97% 97%  Weight:  55.6 kg      Intake/Output Summary (Last 24 hours) at 12/22/2022 1456 Last data filed at 12/22/2022 1300 Gross per 24 hour  Intake 1619.55 ml  Output 1725 ml  Net -105.45 ml   Filed Weights   12/22/22 0300  Weight: 55.6 kg    Examination: Physical Exam:  Constitutional: Thin chronically ill-appearing African-American male in no acute distress appears confused Respiratory: Diminished to auscultation bilaterally, no wheezing, rales, rhonchi or crackles. Normal respiratory effort and patient is not tachypenic. No accessory muscle use.  Unlabored breathing Cardiovascular: RRR, no murmurs / rubs / gallops. S1 and S2 auscultated. No extremity edema.  Abdomen: Soft, non-tender, non-distended.  Bowel sounds positive.  GU: Deferred.  Foley catheter in place which is draining bloody colored urine Musculoskeletal: Has contractures noted ROM, no contractures. Normal strength and muscle tone.  Skin: No rashes, lesions, ulcers. No induration; Warm and dry.  Neurologic: CN 2-12 grossly intact with no focal deficits. Romberg sign cerebellar reflexes not assessed.  Psychiatric: Impaired judgment and insight. Alert and oriented x 3. Normal mood and appropriate affect.   Data Reviewed: I have personally reviewed following labs and imaging studies  CBC: Recent Labs  Lab 12/21/22 2021 12/22/22 0221  WBC 7.1 7.9  NEUTROABS 4.6  --   HGB  9.3* 8.6*  HCT 29.2* 27.0*  MCV 93.6 92.5  PLT 266 249   Basic Metabolic Panel: Recent Labs  Lab 12/21/22 2021 12/22/22 0221  NA 141 139  K 4.4 3.9  CL 104 108  CO2 20* 21*  GLUCOSE 138* 115*  BUN 60* 52*  CREATININE 3.97* 3.00*  CALCIUM 9.0 8.6*   GFR: Estimated Creatinine Clearance: 17.5 mL/min (A) (by C-G formula based on SCr of 3 mg/dL (H)). Liver Function Tests: No results for input(s): "AST", "ALT", "ALKPHOS", "BILITOT", "PROT", "ALBUMIN" in the last 168 hours. Recent Labs  Lab 12/21/22 2021  LIPASE 27   No results for input(s): "AMMONIA" in the last 168 hours. Coagulation Profile: No results for input(s): "INR", "PROTIME" in the last 168 hours. Cardiac Enzymes: No results for input(s): "CKTOTAL", "CKMB", "CKMBINDEX", "TROPONINI" in the last 168 hours. BNP (last  3 results) No results for input(s): "PROBNP" in the last 8760 hours. HbA1C: No results for input(s): "HGBA1C" in the last 72 hours. CBG: No results for input(s): "GLUCAP" in the last 168 hours. Lipid Profile: No results for input(s): "CHOL", "HDL", "LDLCALC", "TRIG", "CHOLHDL", "LDLDIRECT" in the last 72 hours. Thyroid Function Tests: No results for input(s): "TSH", "T4TOTAL", "FREET4", "T3FREE", "THYROIDAB" in the last 72 hours. Anemia Panel: No results for input(s): "VITAMINB12", "FOLATE", "FERRITIN", "TIBC", "IRON", "RETICCTPCT" in the last 72 hours. Sepsis Labs: No results for input(s): "PROCALCITON", "LATICACIDVEN" in the last 168 hours.  No results found for this or any previous visit (from the past 240 hour(s)).   Radiology Studies: No results found.  Scheduled Meds:  aspirin EC  81 mg Oral Daily   atenolol  12.5 mg Oral Daily   doxazosin  1 mg Oral Daily   [START ON 12/23/2022] levofloxacin  500 mg Oral Q48H   OLANZapine  2.5 mg Oral q morning   OLANZapine zydis  5 mg Oral QHS   rosuvastatin  10 mg Oral Daily   Continuous Infusions:  sodium chloride 125 mL/hr at 12/22/22 0847    LOS: 1 day   Marguerita Merles, DO [facility] Available via Epic secure chat 7am-7pm After these hours, please refer to coverage provider listed on [email or web address] 12/22/2022, 2:56 PM

## 2022-12-23 ENCOUNTER — Ambulatory Visit (HOSPITAL_COMMUNITY): Payer: 59

## 2022-12-23 ENCOUNTER — Ambulatory Visit: Payer: 59 | Admitting: Surgery

## 2022-12-23 DIAGNOSIS — N179 Acute kidney failure, unspecified: Secondary | ICD-10-CM | POA: Diagnosis not present

## 2022-12-23 DIAGNOSIS — D649 Anemia, unspecified: Secondary | ICD-10-CM | POA: Diagnosis not present

## 2022-12-23 DIAGNOSIS — G9341 Metabolic encephalopathy: Secondary | ICD-10-CM | POA: Diagnosis not present

## 2022-12-23 DIAGNOSIS — R339 Retention of urine, unspecified: Secondary | ICD-10-CM | POA: Diagnosis not present

## 2022-12-23 LAB — CBC WITH DIFFERENTIAL/PLATELET
Abs Immature Granulocytes: 0.02 10*3/uL (ref 0.00–0.07)
Basophils Absolute: 0 10*3/uL (ref 0.0–0.1)
Basophils Relative: 1 %
Eosinophils Absolute: 0.5 10*3/uL (ref 0.0–0.5)
Eosinophils Relative: 8 %
HCT: 25.6 % — ABNORMAL LOW (ref 39.0–52.0)
Hemoglobin: 7.8 g/dL — ABNORMAL LOW (ref 13.0–17.0)
Immature Granulocytes: 0 %
Lymphocytes Relative: 27 %
Lymphs Abs: 1.6 10*3/uL (ref 0.7–4.0)
MCH: 29.1 pg (ref 26.0–34.0)
MCHC: 30.5 g/dL (ref 30.0–36.0)
MCV: 95.5 fL (ref 80.0–100.0)
Monocytes Absolute: 0.7 10*3/uL (ref 0.1–1.0)
Monocytes Relative: 13 %
Neutro Abs: 3 10*3/uL (ref 1.7–7.7)
Neutrophils Relative %: 51 %
Platelets: 248 10*3/uL (ref 150–400)
RBC: 2.68 MIL/uL — ABNORMAL LOW (ref 4.22–5.81)
RDW: 16 % — ABNORMAL HIGH (ref 11.5–15.5)
WBC: 5.8 10*3/uL (ref 4.0–10.5)
nRBC: 0 % (ref 0.0–0.2)

## 2022-12-23 LAB — COMPREHENSIVE METABOLIC PANEL
ALT: 23 U/L (ref 0–44)
AST: 25 U/L (ref 15–41)
Albumin: 2.7 g/dL — ABNORMAL LOW (ref 3.5–5.0)
Alkaline Phosphatase: 34 U/L — ABNORMAL LOW (ref 38–126)
Anion gap: 7 (ref 5–15)
BUN: 37 mg/dL — ABNORMAL HIGH (ref 8–23)
CO2: 20 mmol/L — ABNORMAL LOW (ref 22–32)
Calcium: 8.4 mg/dL — ABNORMAL LOW (ref 8.9–10.3)
Chloride: 116 mmol/L — ABNORMAL HIGH (ref 98–111)
Creatinine, Ser: 1.68 mg/dL — ABNORMAL HIGH (ref 0.61–1.24)
GFR, Estimated: 43 mL/min — ABNORMAL LOW (ref >60)
Glucose, Bld: 102 mg/dL — ABNORMAL HIGH (ref 70–99)
Potassium: 4.1 mmol/L (ref 3.5–5.1)
Sodium: 143 mmol/L (ref 135–145)
Total Bilirubin: 0.3 mg/dL (ref 0.3–1.2)
Total Protein: 6.7 g/dL (ref 6.5–8.1)

## 2022-12-23 LAB — IRON AND TIBC
Iron: 14 ug/dL — ABNORMAL LOW (ref 45–182)
Saturation Ratios: 8 % — ABNORMAL LOW (ref 17.9–39.5)
TIBC: 175 ug/dL — ABNORMAL LOW (ref 250–450)
UIBC: 161 ug/dL

## 2022-12-23 LAB — FOLATE: Folate: >40 ng/mL (ref >5.9)

## 2022-12-23 LAB — RETICULOCYTES
Immature Retic Fract: 18.8 % — ABNORMAL HIGH (ref 2.3–15.9)
RBC.: 2.63 MIL/uL — ABNORMAL LOW (ref 4.22–5.81)
Retic Count, Absolute: 35.2 10*3/uL (ref 19.0–186.0)
Retic Ct Pct: 1.3 % (ref 0.4–3.1)

## 2022-12-23 LAB — URINE CULTURE: Culture: >=100000 — AB

## 2022-12-23 LAB — MRSA NEXT GEN BY PCR, NASAL: MRSA by PCR Next Gen: DETECTED — AB

## 2022-12-23 LAB — MAGNESIUM: Magnesium: 2.1 mg/dL (ref 1.7–2.4)

## 2022-12-23 LAB — VITAMIN B12: Vitamin B-12: 440 pg/mL (ref 180–914)

## 2022-12-23 LAB — FERRITIN: Ferritin: 210 ng/mL (ref 24–336)

## 2022-12-23 LAB — PHOSPHORUS: Phosphorus: 2.9 mg/dL (ref 2.5–4.6)

## 2022-12-23 MED ORDER — MUPIROCIN 2 % EX OINT
1.0000 | TOPICAL_OINTMENT | Freq: Two times a day (BID) | CUTANEOUS | Status: DC
  Administered 2022-12-23 – 2022-12-27 (×7): 1 via NASAL
  Filled 2022-12-23 (×5): qty 22

## 2022-12-23 NOTE — Plan of Care (Signed)
  Problem: Education: Goal: Knowledge of General Education information will improve Description: Including pain rating scale, medication(s)/side effects and non-pharmacologic comfort measures 12/23/2022 1717 by Joette Catching, RN Outcome: Progressing 12/23/2022 1637 by Joette Catching, RN Outcome: Progressing   Problem: Education: Goal: Knowledge of General Education information will improve Description: Including pain rating scale, medication(s)/side effects and non-pharmacologic comfort measures 12/23/2022 1717 by Daleen Snook T, RN Outcome: Progressing 12/23/2022 1637 by Joette Catching, RN Outcome: Progressing   Problem: Health Behavior/Discharge Planning: Goal: Ability to manage health-related needs will improve Outcome: Progressing   Problem: Clinical Measurements: Goal: Ability to maintain clinical measurements within normal limits will improve Outcome: Progressing Goal: Will remain free from infection Outcome: Progressing Goal: Diagnostic test results will improve Outcome: Progressing Goal: Respiratory complications will improve Outcome: Progressing Goal: Cardiovascular complication will be avoided Outcome: Progressing   Problem: Activity: Goal: Risk for activity intolerance will decrease Outcome: Progressing   Problem: Nutrition: Goal: Adequate nutrition will be maintained 12/23/2022 1717 by Daleen Snook T, RN Outcome: Progressing 12/23/2022 1637 by Daleen Snook T, RN Outcome: Progressing   Problem: Coping: Goal: Level of anxiety will decrease Outcome: Progressing   Problem: Elimination: Goal: Will not experience complications related to bowel motility 12/23/2022 1717 by Daleen Snook T, RN Outcome: Progressing 12/23/2022 1637 by Daleen Snook T, RN Outcome: Progressing Goal: Will not experience complications related to urinary retention 12/23/2022 1717 by Daleen Snook T, RN Outcome: Progressing 12/23/2022 1637 by Daleen Snook T, RN Outcome: Progressing    Problem: Pain Managment: Goal: General experience of comfort will improve 12/23/2022 1717 by Daleen Snook T, RN Outcome: Progressing 12/23/2022 1637 by Daleen Snook T, RN Outcome: Progressing   Problem: Safety: Goal: Ability to remain free from injury will improve 12/23/2022 1717 by Daleen Snook T, RN Outcome: Progressing 12/23/2022 1637 by Daleen Snook T, RN Outcome: Progressing   Problem: Skin Integrity: Goal: Risk for impaired skin integrity will decrease 12/23/2022 1717 by Daleen Snook T, RN Outcome: Progressing 12/23/2022 1637 by Joette Catching, RN Outcome: Progressing

## 2022-12-23 NOTE — Progress Notes (Signed)
PROGRESS NOTE    Jason Jones  WUJ:[id number] DOB: 1951/03/10 DOA: 12/21/2022 PCP: [name], NP   Brief Narrative:  Patient is a 72 year old chronically ill-appearing elderly AAM with a past medical history significant for benign to prostate cancer, BPH, chronic Foley catheter, history of CVA as well as other comorbidities who presented from his nursing facility for the evaluation of altered mental status and gross hematuria.  Reportedly had his Foley catheter changed at this facility yesterday and was draining only frank blood from the catheter since then and was noted to be less conversant, more confused and not eating as much.  Given his symptoms he was sent to the ED for further evaluation he was found to be afebrile and saturating well on room air.  Labs were notable for a BUN of 60 and a creatinine of 3.97.  Ultrasound showed and revealed significant urinary retention.  Foley catheter was exchanged in the ED and now draining a significant amount of nonbloody urine.  There is concern for a urinary tract infection so he is initiated on Levaquin and is given IV fluid hydration.  Urine Cx showing GNR and Staph Aureus with Sensitivities pending. He is slowly improving. Anticipating D/C back to his facility in 24-48 hours pending Cx and further Renal Improvement.   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 GNR and Staphylococcus Aureus UTI   Elevated Anion Gap Metabolic Acidosis -BUN is 60 and SCr 3.97 on admission, up from 24 & 1.08 two wks ago  -Likely d/t obstruction; Foley changed in ED and now draining purulent but non-bloody urine  -He has bacteriuria and pyuria; urine will be cultured and antibiotics given; U/A done and showed a turbid appearance with yellow color urine, negative glucose, large hemoglobin, moderate leukocytes, negative nitrites, 100 protein, many bacteria, greater than 50 RBCs per high-power field, greater than 50 WBCs -Patient's urine  culture still pending -Patient was placed on levofloxacin in the ED and has been continued and will continue for now pending urine culture results -Urine Cx showing: Culture  Abnormal  >=100,000 COLONIES/mL STAPHYLOCOCCUS AUREUS >=100,000 COLONIES/mL GRAM NEGATIVE RODS SUSCEPTIBILITIES TO FOLLOW Performed at [facility], [address] [place], [place] [address]  -Recent CT Angio GI Bleed scan showed "Continued severe diffuse wall thickening of urinary bladder is noted which may be due to history of chronic bladder outlet obstruction, although cystitis cannot be excluded. Urinary bladder is decompressed secondary to Foley catheter. Stable prostatic enlargement."  -BUN/Cr Trend: Recent Labs  Lab 12/06/22 0551 12/21/22 2021 12/22/22 0221 12/23/22 0124  BUN 24* 60* 52* 37*  CREATININE 1.08 3.97* 3.00* 1.68*  -Has a slight Metabolic Acidosis with a CO2 of 20, AG of 7, Chloride Level of 116 -Continued IV fluid hydration with normal saline at 125 MLS per hour for 20 hours -Avoid Nephrotoxic Medications, Contrast Dyes, Hypotension and Dehydration to Ensure Adequate Renal Perfusion and will need to Renally Adjust Meds -Continue to Monitor and Trend Renal Function carefully and repeat CMP in the AM   Hx of CVA  -Continue ASA 81 mg po Daily and Rosuvastatin 10 mg po Daily for now   Dementia  -Use Delirium precautions,  -Continue Olanzapine 2.5 mg po Daily and 5 mg po qHS   Hypertension  -Continue Atenolol 12.5 mg po Daily and  -Continue to Monitor BP per Protocol -Last BP reading was on the softer side at 98/59   Acute Metabolic Encephalopathy, improving  -Likely d/t acute urinary  retention (now relieved) and/or azotemia; Still confused -Continue Foley catheter, treat AKI as above,  -Continue Delirium precautions -Will expand workup if fails to resolve as expected and obtain Head Imaging, TSH, RPR, B12 and Ammonia Level   Normocytic Anemia  -Appears relatively stable; recently  had an FOBT positive and had a CT angio GI bleed question and showed "No definite evidence active gastrointestinal hemorrhage. Moderate focal stenosis seen involving the proximal right superficial femoral artery. Severe focal stenosis seen involving proximal left superficial femoral artery. Aortic Atherosclerosis" -Hgb/Hct Trend: Recent Labs  Lab 12/06/22 0551 12/21/22 2021 12/22/22 0221 12/23/22 0124  HGB 9.5* 9.3* 8.6* 7.8*  HCT 30.2* 29.2* 27.0* 25.6*  MCV 93.8 93.6 92.5 95.5  -Checked Anemia Panel iron level of 14, UIBC 161, TIBC 175, saturation ratio of 8%, ferritin level 210, folate level greater than 40, vitamin B12 level 440 -Continue to Monitor for S/Sx of Bleeding; No overt Bleeding noted -Repeat CBC in the AM  Hypoalbuminemia -Patient's Albumin Trend: Recent Labs  Lab 12/06/22 0551 12/23/22 0124  ALBUMIN 3.6 2.7*  -Continue to Monitor and Trend and repeat CMP in the AM  Underweight -Complicates overall prognosis and care -Estimated body mass index is 17.1 kg/m as calculated from the following:   Height as of 11/14/22: 5\' 11"  (1.803 m).   Weight as of this encounter: 55.6 kg.  -Will obtain Nutrition Consultation      DVT prophylaxis: SCDs Start: 12/21/22 2213    Code Status: DNR Family Communication: No family present at bedside  Disposition Plan:  Level of care: Med-Surg Status is: Inpatient Remains inpatient appropriate because: Needs further clinical improvement   Consultants:  None  Procedures:  As delineated as Above  Antimicrobials:  Anti-infectives (From admission, onward)    Start     Dose/Rate Route Frequency Ordered Stop   12/23/22 2230  levofloxacin (LEVAQUIN) tablet 500 mg        500 mg Oral Every 48 hours 12/21/22 2217     12/21/22 2230  levofloxacin (LEVAQUIN) IVPB 500 mg        500 mg 100 mL/hr over 60 Minutes Intravenous  Once 12/21/22 2228 12/22/22 0109   12/21/22 2215  levofloxacin (LEVAQUIN) IVPB 750 mg  Status:  Discontinued         750 mg 100 mL/hr over 90 Minutes Intravenous  Once 12/21/22 2217 12/21/22 2227       Subjective: Seen And examined at bedside and he was doing okay.  Renal function continues to improve.  Wanting to rest.  Asking if he has a bowel movement. No other concerns or complaints at this time.  Objective: Vitals:   12/22/22 2126 12/22/22 2132 12/23/22 0546 12/23/22 0829  BP: 110/64 110/64 130/74 (!) 98/59  Pulse: 81 78 81 72  Resp:  18 18 20   Temp:  98.7 F (37.1 C) 98.5 F (36.9 C) 98.2 F (36.8 C)  TempSrc:  Oral Oral Oral  SpO2: 95% 95% 100% 97%  Weight:        Intake/Output Summary (Last 24 hours) at 12/23/2022 1517 Last data filed at 12/23/2022 1358 Gross per 24 hour  Intake 840 ml  Output 1700 ml  Net -860 ml   Filed Weights   12/22/22 0300  Weight: 55.6 kg   Examination: Physical Exam:  Constitutional: Chronically ill-appearing African-American male in no acute distress appears calm but slightly confused still Respiratory: Diminished to auscultation bilaterally, no wheezing, rales, rhonchi or crackles. Normal respiratory effort and patient is not tachypenic.  No accessory muscle use.  Unlabored breathing Cardiovascular: RRR, no murmurs / rubs / gallops. S1 and S2 auscultated. No extremity edema.  Abdomen: Soft, non-tender, non-distended. Bowel sounds positive.  GU: Deferred.  Catheter in place and urine color is slightly improved but still cloudy Musculoskeletal: Has some contractures noted Skin: No rashes, lesions, ulcers on limited skin evaluation. No induration; Warm and dry.  Neurologic: CN 2-12 grossly intact with no focal deficits. Romberg sign and cerebellar reflexes not assessed.  Psychiatric: Impaired judgment and insight.  He is awake and alert but not fully oriented  Data Reviewed: I have personally reviewed following labs and imaging studies  CBC: Recent Labs  Lab 12/21/22 2021 12/22/22 0221 12/23/22 0124  WBC 7.1 7.9 5.8  NEUTROABS 4.6  --  3.0  HGB  9.3* 8.6* 7.8*  HCT 29.2* 27.0* 25.6*  MCV 93.6 92.5 95.5  PLT 266 249 248   Basic Metabolic Panel: Recent Labs  Lab 12/21/22 2021 12/22/22 0221 12/23/22 0124  NA 141 139 143  K 4.4 3.9 4.1  CL 104 108 116*  CO2 20* 21* 20*  GLUCOSE 138* 115* 102*  BUN 60* 52* 37*  CREATININE 3.97* 3.00* 1.68*  CALCIUM 9.0 8.6* 8.4*  MG  --   --  2.1  PHOS  --   --  2.9   GFR: Estimated Creatinine Clearance: 31.3 mL/min (A) (by C-G formula based on SCr of 1.68 mg/dL (H)). Liver Function Tests: Recent Labs  Lab 12/23/22 0124  AST 25  ALT 23  ALKPHOS 34*  BILITOT 0.3  PROT 6.7  ALBUMIN 2.7*   Recent Labs  Lab 12/21/22 2021  LIPASE 27   No results for input(s): "AMMONIA" in the last 168 hours. Coagulation Profile: No results for input(s): "INR", "PROTIME" in the last 168 hours. Cardiac Enzymes: No results for input(s): "CKTOTAL", "CKMB", "CKMBINDEX", "TROPONINI" in the last 168 hours. BNP (last 3 results) No results for input(s): "PROBNP" in the last 8760 hours. HbA1C: No results for input(s): "HGBA1C" in the last 72 hours. CBG: No results for input(s): "GLUCAP" in the last 168 hours. Lipid Profile: No results for input(s): "CHOL", "HDL", "LDLCALC", "TRIG", "CHOLHDL", "LDLDIRECT" in the last 72 hours. Thyroid Function Tests: No results for input(s): "TSH", "T4TOTAL", "FREET4", "T3FREE", "THYROIDAB" in the last 72 hours. Anemia Panel: Recent Labs    12/23/22 0124  VITAMINB12 440  FOLATE >40.0  FERRITIN 210  TIBC 175*  IRON 14*  RETICCTPCT 1.3   Sepsis Labs: No results for input(s): "PROCALCITON", "LATICACIDVEN" in the last 168 hours.  Recent Results (from the past 240 hour(s))  Urine Culture (for pregnant, neutropenic or urologic patients or patients with an indwelling urinary catheter)     Status: Abnormal (Preliminary result)   Collection Time: 12/22/22  8:48 AM   Specimen: Urine, Catheterized  Result Value Ref Range Status   Specimen Description URINE,  CATHETERIZED  Final   Special Requests NONE  Final   Culture (A)  Final    >=100,000 COLONIES/mL STAPHYLOCOCCUS AUREUS >=100,000 COLONIES/mL GRAM NEGATIVE RODS SUSCEPTIBILITIES TO FOLLOW Performed at [facility], [address], [place], [place] [address]    Report Status PENDING  Incomplete    Radiology Studies: No results found.  Scheduled Meds:  aspirin EC  81 mg Oral Daily   atenolol  12.5 mg Oral Daily   Chlorhexidine Gluconate Cloth  6 each Topical Daily   doxazosin  1 mg Oral Daily   levofloxacin  500 mg Oral Q48H  OLANZapine  2.5 mg Oral q morning   OLANZapine zydis  5 mg Oral QHS   rosuvastatin  10 mg Oral Daily   Continuous Infusions:   LOS: 2 days   Marguerita Merles, DO [facility] Available via Epic secure chat 7am-7pm After these hours, please refer to coverage provider listed on [email or web address] 12/23/2022, 3:17 PM

## 2022-12-23 NOTE — TOC Progression Note (Signed)
Transition of Care [facility]) - Initial/Assessment Note    Patient Details  Name: Jason Jones MRN: [id number] Date of Birth: Oct 27, 1950  [facility] CM/SW Contact:    Ralene Bathe, LCSW Phone Number: 12/23/2022, 10:32 AM  Clinical Narrative:                 LCSW contacted admissions at [facility]).  The patient is a long term resident at the facility and can return when medically ready.  TOC following.         Patient Goals and CMS Choice            Expected Discharge Plan and Services                                              Prior Living Arrangements/Services                       Activities of Daily Living      Permission Sought/Granted                  Emotional Assessment              Admission diagnosis:  Dehydration [E86.0] AKI (acute kidney injury) (HCC) [N17.9] Acute renal failure superimposed on stage 3a chronic kidney disease (HCC) [N17.9, N18.31] Patient Active Problem List   Diagnosis Date Noted   Acute renal failure superimposed on stage 3a chronic kidney disease (HCC) 12/21/2022   Acute metabolic encephalopathy 12/21/2022   Urinary retention 07/31/2022   Hydronephrosis, bilateral 07/31/2022   Urinary tract infection associated with indwelling urethral catheter (HCC) 07/31/2022   Leukocytosis 07/30/2022   Pressure injury of skin 07/20/2022   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   Chronic hepatitis C (HCC) 07/20/2022   Protein-calorie malnutrition, severe (HCC) 07/20/2022   Rectal bleeding 07/20/2022   Normocytic anemia 06/28/2022   Hematuria 05/01/2022   History of CVA (cerebrovascular accident) 04/17/2022   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   Malnutrition of moderate degree 04/15/2022   Primary hypertension 03/25/2022   Dysphagia due to recent cerebrovascular accident (CVA) 03/25/2022   Elevated liver enzymes 03/25/2022   Rhabdomyolysis 03/25/2022   Prostate cancer (HCC)  02/20/2021   PCP:  [name], NP Pharmacy:   [facility] - [place], [place] - [address] [place] [address] Phone: [phone] Fax: [phone]     Social Determinants of Health (SDOH) Social History: SDOH Screenings   Food Insecurity: No Food Insecurity (08/03/2022)  Housing: Low Risk  (08/03/2022)  Transportation Needs: No Transportation Needs (08/03/2022)  Utilities: Not At Risk (08/03/2022)  Tobacco Use: High Risk (12/21/2022)   SDOH Interventions:     Readmission Risk Interventions    08/05/2022   12:17 PM 07/31/2022   12:11 PM  Readmission Risk Prevention Plan  Transportation Screening Complete Complete  PCP or Specialist Appt within 3-5 Days  Complete  HRI or Home Care Consult  Complete  Social Work Consult for Recovery Care Planning/Counseling  Complete  Palliative Care Screening  Not Applicable  Medication Review [employer]) Complete Complete  PCP or Specialist appointment within 3-5 days of discharge Complete   HRI or Home Care Consult Complete   SW Recovery Care/Counseling Consult Complete   Palliative Care Screening Not Applicable   Skilled Nursing Facility Complete

## 2022-12-24 DIAGNOSIS — R339 Retention of urine, unspecified: Secondary | ICD-10-CM | POA: Diagnosis not present

## 2022-12-24 DIAGNOSIS — G9341 Metabolic encephalopathy: Secondary | ICD-10-CM | POA: Diagnosis not present

## 2022-12-24 DIAGNOSIS — N179 Acute kidney failure, unspecified: Secondary | ICD-10-CM | POA: Diagnosis not present

## 2022-12-24 DIAGNOSIS — D649 Anemia, unspecified: Secondary | ICD-10-CM | POA: Diagnosis not present

## 2022-12-24 LAB — URINE CULTURE

## 2022-12-24 LAB — CBC WITH DIFFERENTIAL/PLATELET
Abs Immature Granulocytes: 0.02 10*3/uL (ref 0.00–0.07)
Basophils Absolute: 0 10*3/uL (ref 0.0–0.1)
Basophils Relative: 1 %
Eosinophils Absolute: 0.6 10*3/uL — ABNORMAL HIGH (ref 0.0–0.5)
Eosinophils Relative: 9 %
HCT: 24.1 % — ABNORMAL LOW (ref 39.0–52.0)
Hemoglobin: 7.6 g/dL — ABNORMAL LOW (ref 13.0–17.0)
Immature Granulocytes: 0 %
Lymphocytes Relative: 26 %
Lymphs Abs: 1.6 10*3/uL (ref 0.7–4.0)
MCH: 29.7 pg (ref 26.0–34.0)
MCHC: 31.5 g/dL (ref 30.0–36.0)
MCV: 94.1 fL (ref 80.0–100.0)
Monocytes Absolute: 0.8 10*3/uL (ref 0.1–1.0)
Monocytes Relative: 12 %
Neutro Abs: 3.2 10*3/uL (ref 1.7–7.7)
Neutrophils Relative %: 52 %
Platelets: 238 10*3/uL (ref 150–400)
RBC: 2.56 MIL/uL — ABNORMAL LOW (ref 4.22–5.81)
RDW: 15.4 % (ref 11.5–15.5)
WBC: 6.2 10*3/uL (ref 4.0–10.5)
nRBC: 0 % (ref 0.0–0.2)

## 2022-12-24 LAB — COMPREHENSIVE METABOLIC PANEL
ALT: 28 U/L (ref 0–44)
AST: 27 U/L (ref 15–41)
Albumin: 2.6 g/dL — ABNORMAL LOW (ref 3.5–5.0)
Alkaline Phosphatase: 38 U/L (ref 38–126)
Anion gap: 9 (ref 5–15)
BUN: 33 mg/dL — ABNORMAL HIGH (ref 8–23)
CO2: 23 mmol/L (ref 22–32)
Calcium: 8.3 mg/dL — ABNORMAL LOW (ref 8.9–10.3)
Chloride: 107 mmol/L (ref 98–111)
Creatinine, Ser: 1.16 mg/dL (ref 0.61–1.24)
GFR, Estimated: >60 mL/min (ref >60)
Glucose, Bld: 130 mg/dL — ABNORMAL HIGH (ref 70–99)
Potassium: 3.8 mmol/L (ref 3.5–5.1)
Sodium: 139 mmol/L (ref 135–145)
Total Bilirubin: 0.2 mg/dL — ABNORMAL LOW (ref 0.3–1.2)
Total Protein: 6.5 g/dL (ref 6.5–8.1)

## 2022-12-24 LAB — MAGNESIUM: Magnesium: 1.9 mg/dL (ref 1.7–2.4)

## 2022-12-24 LAB — PHOSPHORUS: Phosphorus: 3.3 mg/dL (ref 2.5–4.6)

## 2022-12-24 MED ORDER — ENSURE ENLIVE PO LIQD
237.0000 mL | Freq: Three times a day (TID) | ORAL | Status: DC
  Administered 2022-12-24 – 2022-12-27 (×7): 237 mL via ORAL

## 2022-12-24 MED ORDER — POLYSACCHARIDE IRON COMPLEX 150 MG PO CAPS
150.0000 mg | ORAL_CAPSULE | Freq: Every day | ORAL | Status: DC
  Administered 2022-12-24 – 2022-12-27 (×4): 150 mg via ORAL
  Filled 2022-12-24 (×4): qty 1

## 2022-12-24 MED ORDER — GENTAMICIN SULFATE 40 MG/ML IJ SOLN
5.0000 mg/kg | Freq: Once | INTRAVENOUS | Status: AC
  Administered 2022-12-24: 280 mg via INTRAVENOUS
  Filled 2022-12-24: qty 7

## 2022-12-24 MED ORDER — BACLOFEN 10 MG PO TABS
5.0000 mg | ORAL_TABLET | Freq: Two times a day (BID) | ORAL | Status: DC
  Administered 2022-12-24 – 2022-12-27 (×6): 5 mg via ORAL
  Filled 2022-12-24 (×6): qty 1

## 2022-12-24 NOTE — Plan of Care (Signed)

## 2022-12-24 NOTE — Progress Notes (Incomplete)
Initial Nutrition Assessment  DOCUMENTATION CODES:      INTERVENTION:  ***   NUTRITION DIAGNOSIS:     related to   as evidenced by  .  ***  GOAL:      ***  MONITOR:      REASON FOR ASSESSMENT:   Consult Assessment of nutrition requirement/status  ASSESSMENT:   72 y.o. male admits related to gross hematuria and AMS. PMH includes: acute ischemic stroke, arthritis, asthma, cancer, CAP, GERD, HTN. Pt is currently receiving medical management related to UTI obstruction.  Meds reviewed. Labs reviewed: BUN elevated.    NUTRITION - FOCUSED PHYSICAL EXAM:  {RD Focused Exam List:21252}  Diet Order:   Diet Order             DIET - DYS 1 Room service appropriate? Yes; Fluid consistency: Thin  Diet effective now                   EDUCATION NEEDS:      Skin:  Skin Assessment: Skin Integrity Issues: Skin Integrity Issues:: Stage II Stage II: L ankle  Last BM:  PTA  Height:   Ht Readings from Last 1 Encounters:  11/14/22 5\' 11"  (1.803 m)    Weight:   Wt Readings from Last 1 Encounters:  12/24/22 55.2 kg    Ideal Body Weight:     BMI:  Body mass index is 16.97 kg/m.  Estimated Nutritional Needs:   Kcal:  1650-1950 kcals  Protein:  80-100 gm  Fluid:  >/= 1.6 L  Bethann Humble, RD, LDN, CNSC.

## 2022-12-24 NOTE — Progress Notes (Signed)
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  Reportedly had his Foley catheter changed at this facility yesterday and was draining only frank blood from the catheter since then and was noted to be less conversant, more confused and not eating as much.  Given his symptoms he was sent to the ED for further evaluation he was found to be afebrile and saturating well on room air.  Labs were notable for a BUN of 60 and a creatinine of 3.97.  Ultrasound showed and revealed significant urinary retention.  Foley catheter was exchanged in the ED and now draining a significant amount of nonbloody urine.  There is concern for a urinary tract infection so he is initiated on Levaquin and is given IV fluid hydration.  Urine Cx showing GNR and Staph Aureus with Sensitivities as below.  She is getting no real good p.o. options given that the extent of his microbial UTI so I discussed the case with infectious disease pharmacist who recommended a dose of gentamicin which has been ordered.  Will need to watch his renal function carefully but he is slowly improving. Anticipating D/C back to his facility in 24-48 hours pending Cx and further Renal Improvement.   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 Polymicrobial UTI Elevated Anion Gap Metabolic Acidosis -BUN is 60 and SCr 3.97 on admission, up from 24 & 1.08 two wks ago  -Likely d/t obstruction; Foley changed in ED and now draining purulent but non-bloody urine  -He has bacteriuria and pyuria; urine will be cultured and antibiotics given; U/A done  and showed a turbid appearance with yellow color urine, negative glucose, large hemoglobin, moderate leukocytes, negative nitrites, 100 protein, many bacteria, greater than 50 RBCs per high-power field, greater than 50 WBCs -Patient's urine culture still pending -Patient was placed on levofloxacin in the ED and has been continued and now stopped given results as below. Discussed with ID Pharmacist who recommends 1x dose of Gentamicin given Sensitivities  -Urine Cx showing: Culture  Abnormal  >=100,000 COLONIES/mL METHICILLIN RESISTANT STAPHYLOCOCCUS AUREUS >=100,000 COLONIES/mL CITROBACTER FREUNDII 50,000 COLONIES/mL ENTEROBACTER INTERMEDIUS   Report Status 12/24/2022 FINAL  Organism ID, Bacteria METHICILLIN RESISTANT STAPHYLOCOCCUS AUREUS Abnormal   Organism ID, Bacteria CITROBACTER FREUNDII Abnormal   Organism ID, Bacteria ENTEROBACTER INTERMEDIUS Abnormal   Resulting Agency [facility]     Susceptibility    Methicillin resistant staphylococcus aureus Citrobacter freundii Enterobacter intermedius    MIC MIC MIC    AMPICILLIN     >=32 RESIST... Resistant    AMPICILLIN/SULBACTAM     16 INTERMED... Intermediate    CEFEPIME   <=0.12 SENS... Sensitive <=0.12 SENS... Sensitive    CEFTRIAXONE   <=0.25 SENS... Sensitive <=0.25 SENS... Sensitive    CIPROFLOXACIN >=8 RESISTANT Resistant 2 RESISTANT Resistant 2 RESISTANT Resistant    CLINDAMYCIN >=8 RESISTANT Resistant        GENTAMICIN <=0.5 SENSI... Sensitive <=1 SENSITIVE Sensitive <=1 SENSITIVE Sensitive    IMIPENEM   0.5 SENSITIVE Sensitive <=0.25 SENS... Sensitive    Inducible Clindamycin NEGATIVE Sensitive        LINEZOLID 2 SENSITIVE Sensitive  NITROFURANTOIN <=16 SENSIT... Sensitive <=16 SENSIT... Sensitive 256 RESISTANT Resistant    OXACILLIN >=4 RESISTANT Resistant        PIP/TAZO   8 SENSITIVE Sensitive <=4 SENSITIVE Sensitive    RIFAMPIN <=0.5 SENSI... Sensitive        TETRACYCLINE <=1 SENSITIVE Sensitive         TRIMETH/SULFA <=10 SENSIT... Sensitive >=320 RESIS... Resistant >=320 RESIS... Resistant       -Recent CT Angio GI Bleed scan showed "Continued severe diffuse wall thickening of urinary bladder is noted which may be due to history of chronic bladder outlet obstruction, although cystitis cannot be excluded. Urinary bladder is decompressed secondary to Foley catheter. Stable prostatic enlargement."  -BUN/Cr Trend: Recent Labs  Lab 12/06/22 0551 12/21/22 2021 12/22/22 0221 12/23/22 0124 12/24/22 0109  BUN 24* 60* 52* 37* 33*  CREATININE 1.08 3.97* 3.00* 1.68* 1.16  -Haf a slight Metabolic Acidosis with a CO2 of 20, AG of 7, Chloride Level of 116; Now improved as CO2 is 23, AG is 9, and Chloride Level is 107 -IV fluid hydration with normal saline at 125 MLS per hour for 20 hours now stopped -Avoid Nephrotoxic Medications, Contrast Dyes, Hypotension and Dehydration to Ensure Adequate Renal Perfusion and will need to Renally Adjust Meds -Continue to Monitor and Trend Renal Function carefully and repeat CMP in the AM  -Anticipate D/C back to SNF in the next 24 hours  Hx of CVA  -Continue ASA 81 mg po Daily and Rosuvastatin 10 mg po Daily for now   Dementia  -Use Delirium precautions,  -Continue Olanzapine 2.5 mg po Daily and 5 mg po qHS   Hypertension  -Continue Atenolol 12.5 mg po Daily and  -Continue to Monitor BP per Protocol -Last BP reading was on the softer side at 142/72   Acute Metabolic Encephalopathy, improving  -Likely d/t acute urinary retention (now relieved) and/or azotemia; Not as confused -Continue Foley catheter, treat AKI as above,  -Continue Delirium precautions -Will expand workup if fails to resolve as expected and obtain Head Imaging, TSH, RPR, B12 and Ammonia Level   Normocytic Anemia  -Appears relatively stable; recently had an FOBT positive and had a CT angio GI bleed question and showed "No definite evidence active gastrointestinal hemorrhage. Moderate focal  stenosis seen involving the proximal right superficial femoral artery. Severe focal stenosis seen involving proximal left superficial femoral artery. Aortic Atherosclerosis" -Hgb/Hct Trend: Recent Labs  Lab 12/06/22 0551 12/21/22 2021 12/22/22 0221 12/23/22 0124 12/24/22 0109  HGB 9.5* 9.3* 8.6* 7.8* 7.6*  HCT 30.2* 29.2* 27.0* 25.6* 24.1*  MCV 93.8 93.6 92.5 95.5 94.1  -Checked Anemia Panel iron level of 14, UIBC 161, TIBC 175, saturation ratio of 8%, ferritin level 210, folate level greater than 40, vitamin B12 level 440 -Continue to Monitor for S/Sx of Bleeding; No overt Bleeding noted -Repeat CBC in the AM  Hypoalbuminemia -Patient's Albumin Trend: Recent Labs  Lab 12/06/22 0551 12/23/22 0124 12/24/22 0109  ALBUMIN 3.6 2.7* 2.6*  -Continue to Monitor and Trend and repeat CMP in the AM  Underweight -Complicates overall prognosis and care -Estimated body mass index is 16.97 kg/m as calculated from the following:   Height as of 11/14/22: 5\' 11"  (1.803 m).   Weight as of this encounter: 55.2 kg.  -Will obtain Nutrition Consultation      DVT prophylaxis: SCDs Start: 12/21/22 2213    Code Status: DNR Family Communication: No family currently at bedside  Disposition Plan:  Level of care: Med-Surg Status  is: Inpatient Remains inpatient appropriate because: Needs further clinical improvement   Consultants:  None  Procedures:  As delineated as above  Antimicrobials:  Anti-infectives (From admission, onward)    Start     Dose/Rate Route Frequency Ordered Stop   12/24/22 1200  gentamicin (GARAMYCIN) 280 mg in dextrose 5 % 100 mL IVPB        5 mg/kg  55.2 kg 107 mL/hr over 60 Minutes Intravenous  Once 12/24/22 1113 12/24/22 1324   12/23/22 2230  levofloxacin (LEVAQUIN) tablet 500 mg  Status:  Discontinued        500 mg Oral Every 48 hours 12/21/22 2217 12/24/22 1113   12/21/22 2230  levofloxacin (LEVAQUIN) IVPB 500 mg        500 mg 100 mL/hr over 60 Minutes  Intravenous  Once 12/21/22 2228 12/22/22 0109   12/21/22 2215  levofloxacin (LEVAQUIN) IVPB 750 mg  Status:  Discontinued        750 mg 100 mL/hr over 90 Minutes Intravenous  Once 12/21/22 2217 12/21/22 2227       Subjective: Seen and examined at bedside and he was in more awake and not as confused but was complaining that he cannot move his right leg due to being stuck in the covers.  Felt okay otherwise.  Renal function continues to improve further.  Denies any other concerns or complaints this time.  Objective: Vitals:   12/24/22 0500 12/24/22 0553 12/24/22 0932 12/24/22 1658  BP:  (!) 129/92 115/76 (!) 142/72  Pulse:  74 79 78  Resp:  16 18 18   Temp:  97.9 F (36.6 C) 97.8 F (36.6 C) 98 F (36.7 C)  TempSrc:   Oral Oral  SpO2:  97% 95% 96%  Weight: 55.2 kg       Intake/Output Summary (Last 24 hours) at 12/24/2022 1723 Last data filed at 12/24/2022 1504 Gross per 24 hour  Intake 884.4 ml  Output 1300 ml  Net -415.6 ml   Filed Weights   12/22/22 0300 12/24/22 0500  Weight: 55.6 kg 55.2 kg   Examination: Physical Exam:  Constitutional: Chronically ill-appearing African-American male in no acute distress appears calm Respiratory: Diminished to auscultation bilaterally, no wheezing, rales, rhonchi or crackles. Normal respiratory effort and patient is not tachypenic. No accessory muscle use.  Unlabored breathing Cardiovascular: RRR, no murmurs / rubs / gallops. S1 and S2 auscultated. No extremity edema.  Abdomen: Soft, non-tender, non-distended. Bowel sounds positive.  GU: Deferred.  Catheter in place draining clear urine compared to yesterday Musculoskeletal: Has some contractures noted Skin: No rashes, lesions, ulcers on limited skin evaluation. No induration; Warm and dry.  Neurologic: CN 2-12 grossly intact with no focal deficits. Romberg sign and cerebellar reflexes not assessed.  Psychiatric: Normal judgment and insight. Alert and more awake today  Data Reviewed: I  have personally reviewed following labs and imaging studies  CBC: Recent Labs  Lab 12/21/22 2021 12/22/22 0221 12/23/22 0124 12/24/22 0109  WBC 7.1 7.9 5.8 6.2  NEUTROABS 4.6  --  3.0 3.2  HGB 9.3* 8.6* 7.8* 7.6*  HCT 29.2* 27.0* 25.6* 24.1*  MCV 93.6 92.5 95.5 94.1  PLT 266 249 248 238   Basic Metabolic Panel: Recent Labs  Lab 12/21/22 2021 12/22/22 0221 12/23/22 0124 12/24/22 0109  NA 141 139 143 139  K 4.4 3.9 4.1 3.8  CL 104 108 116* 107  CO2 20* 21* 20* 23  GLUCOSE 138* 115* 102* 130*  BUN 60* 52* 37* 33*  CREATININE 3.97* 3.00* 1.68* 1.16  CALCIUM 9.0 8.6* 8.4* 8.3*  MG  --   --  2.1 1.9  PHOS  --   --  2.9 3.3   GFR: Estimated Creatinine Clearance: 44.9 mL/min (by C-G formula based on SCr of 1.16 mg/dL). Liver Function Tests: Recent Labs  Lab 12/23/22 0124 12/24/22 0109  AST 25 27  ALT 23 28  ALKPHOS 34* 38  BILITOT 0.3 0.2*  PROT 6.7 6.5  ALBUMIN 2.7* 2.6*   Recent Labs  Lab 12/21/22 2021  LIPASE 27   No results for input(s): "AMMONIA" in the last 168 hours. Coagulation Profile: No results for input(s): "INR", "PROTIME" in the last 168 hours. Cardiac Enzymes: No results for input(s): "CKTOTAL", "CKMB", "CKMBINDEX", "TROPONINI" in the last 168 hours. BNP (last 3 results) No results for input(s): "PROBNP" in the last 8760 hours. HbA1C: No results for input(s): "HGBA1C" in the last 72 hours. CBG: No results for input(s): "GLUCAP" in the last 168 hours. Lipid Profile: No results for input(s): "CHOL", "HDL", "LDLCALC", "TRIG", "CHOLHDL", "LDLDIRECT" in the last 72 hours. Thyroid Function Tests: No results for input(s): "TSH", "T4TOTAL", "FREET4", "T3FREE", "THYROIDAB" in the last 72 hours. Anemia Panel: Recent Labs    12/23/22 0124  VITAMINB12 440  FOLATE >40.0  FERRITIN 210  TIBC 175*  IRON 14*  RETICCTPCT 1.3   Sepsis Labs: No results for input(s): "PROCALCITON", "LATICACIDVEN" in the last 168 hours.  Recent Results (from the past  240 hour(s))  Urine Culture (for pregnant, neutropenic or urologic patients or patients with an indwelling urinary catheter)     Status: Abnormal   Collection Time: 12/22/22  8:48 AM   Specimen: Urine, Catheterized  Result Value Ref Range Status   Specimen Description URINE, CATHETERIZED  Final   Special Requests   Final    NONE Performed at [facility], [address] [place], [place] [address]    Culture (A)  Final    >=100,000 COLONIES/mL METHICILLIN RESISTANT STAPHYLOCOCCUS AUREUS >=100,000 COLONIES/mL CITROBACTER FREUNDII 50,000 COLONIES/mL ENTEROBACTER INTERMEDIUS    Report Status 12/24/2022 FINAL  Final   Organism ID, Bacteria METHICILLIN RESISTANT STAPHYLOCOCCUS AUREUS (A)  Final   Organism ID, Bacteria CITROBACTER FREUNDII (A)  Final   Organism ID, Bacteria ENTEROBACTER INTERMEDIUS (A)  Final      Susceptibility   Citrobacter freundii - MIC*    CEFEPIME <=0.12 SENSITIVE Sensitive     CEFTRIAXONE <=0.25 SENSITIVE Sensitive     CIPROFLOXACIN 2 RESISTANT Resistant     GENTAMICIN <=1 SENSITIVE Sensitive     IMIPENEM 0.5 SENSITIVE Sensitive     NITROFURANTOIN <=16 SENSITIVE Sensitive     TRIMETH/SULFA >=320 RESISTANT Resistant     PIP/TAZO 8 SENSITIVE Sensitive     * >=100,000 COLONIES/mL CITROBACTER FREUNDII   Enterobacter intermedius - MIC*    AMPICILLIN >=32 RESISTANT Resistant     CEFEPIME <=0.12 SENSITIVE Sensitive     CEFTRIAXONE <=0.25 SENSITIVE Sensitive     CIPROFLOXACIN 2 RESISTANT Resistant     GENTAMICIN <=1 SENSITIVE Sensitive     IMIPENEM <=0.25 SENSITIVE Sensitive     NITROFURANTOIN 256 RESISTANT Resistant     TRIMETH/SULFA >=320 RESISTANT Resistant     AMPICILLIN/SULBACTAM 16 INTERMEDIATE Intermediate     PIP/TAZO <=4 SENSITIVE Sensitive     * 50,000 COLONIES/mL ENTEROBACTER INTERMEDIUS   Methicillin resistant staphylococcus aureus - MIC*    CIPROFLOXACIN >=8 RESISTANT Resistant     GENTAMICIN <=0.5 SENSITIVE Sensitive     NITROFURANTOIN <=16  SENSITIVE Sensitive     OXACILLIN >=4 RESISTANT Resistant     TETRACYCLINE <=1 SENSITIVE Sensitive     VANCOMYCIN <=0.5 SENSITIVE Sensitive     TRIMETH/SULFA <=10 SENSITIVE Sensitive     CLINDAMYCIN >=8 RESISTANT Resistant     RIFAMPIN <=0.5 SENSITIVE Sensitive     Inducible Clindamycin NEGATIVE Sensitive     LINEZOLID 2 SENSITIVE Sensitive     * >=100,000 COLONIES/mL METHICILLIN RESISTANT STAPHYLOCOCCUS AUREUS  MRSA Next Gen by PCR, Nasal     Status: Abnormal   Collection Time: 12/23/22 11:14 AM   Specimen: Nasal Mucosa; Nasal Swab  Result Value Ref Range Status   MRSA by PCR Next Gen DETECTED (A) NOT DETECTED Final    Comment: RESULT CALLED TO, READ BACK BY AND VERIFIED WITH: RN KELLY DUFFY ON 12/23/22 @ 1609 BY DRT (NOTE) The GeneXpert MRSA Assay (FDA approved for NASAL specimens only), is one component of a comprehensive MRSA colonization surveillance program. It is not intended to diagnose MRSA infection nor to guide or monitor treatment for MRSA infections. Test performance is not FDA approved in patients less than 13 years old. Performed at [facility], [address] [place], [place] [address]     Radiology Studies: No results found.  Scheduled Meds:  aspirin EC  81 mg Oral Daily   atenolol  12.5 mg Oral Daily   baclofen  5 mg Oral BID   Chlorhexidine Gluconate Cloth  6 each Topical Daily   doxazosin  1 mg Oral Daily   feeding supplement  237 mL Oral TID BM   iron polysaccharides  150 mg Oral Daily   mupirocin ointment  1 Application Nasal BID   OLANZapine  2.5 mg Oral q morning   OLANZapine zydis  5 mg Oral QHS   rosuvastatin  10 mg Oral Daily   Continuous Infusions:   LOS: 3 days   Marguerita Merles, DO [facility] Available via Epic secure chat 7am-7pm After these hours, please refer to coverage provider listed on [email or web address] 12/24/2022, 5:23 PM

## 2022-12-25 DIAGNOSIS — N179 Acute kidney failure, unspecified: Secondary | ICD-10-CM | POA: Diagnosis not present

## 2022-12-25 DIAGNOSIS — G9341 Metabolic encephalopathy: Secondary | ICD-10-CM | POA: Diagnosis not present

## 2022-12-25 DIAGNOSIS — D649 Anemia, unspecified: Secondary | ICD-10-CM | POA: Diagnosis not present

## 2022-12-25 DIAGNOSIS — Z8673 Personal history of transient ischemic attack (TIA), and cerebral infarction without residual deficits: Secondary | ICD-10-CM | POA: Diagnosis not present

## 2022-12-25 DIAGNOSIS — N3001 Acute cystitis with hematuria: Secondary | ICD-10-CM

## 2022-12-25 LAB — CBC WITH DIFFERENTIAL/PLATELET
Abs Immature Granulocytes: 0.03 10*3/uL (ref 0.00–0.07)
Basophils Absolute: 0 10*3/uL (ref 0.0–0.1)
Basophils Relative: 0 %
Eosinophils Absolute: 0.5 10*3/uL (ref 0.0–0.5)
Eosinophils Relative: 7 %
HCT: 25.1 % — ABNORMAL LOW (ref 39.0–52.0)
Hemoglobin: 8 g/dL — ABNORMAL LOW (ref 13.0–17.0)
Immature Granulocytes: 0 %
Lymphocytes Relative: 25 %
Lymphs Abs: 1.7 10*3/uL (ref 0.7–4.0)
MCH: 29.9 pg (ref 26.0–34.0)
MCHC: 31.9 g/dL (ref 30.0–36.0)
MCV: 93.7 fL (ref 80.0–100.0)
Monocytes Absolute: 0.8 10*3/uL (ref 0.1–1.0)
Monocytes Relative: 12 %
Neutro Abs: 3.8 10*3/uL (ref 1.7–7.7)
Neutrophils Relative %: 56 %
Platelets: 255 10*3/uL (ref 150–400)
RBC: 2.68 MIL/uL — ABNORMAL LOW (ref 4.22–5.81)
RDW: 15.3 % (ref 11.5–15.5)
WBC: 6.8 10*3/uL (ref 4.0–10.5)
nRBC: 0 % (ref 0.0–0.2)

## 2022-12-25 LAB — PHOSPHORUS: Phosphorus: 3.7 mg/dL (ref 2.5–4.6)

## 2022-12-25 LAB — MAGNESIUM: Magnesium: 1.8 mg/dL (ref 1.7–2.4)

## 2022-12-25 LAB — COMPREHENSIVE METABOLIC PANEL
ALT: 26 U/L (ref 0–44)
AST: 25 U/L (ref 15–41)
Albumin: 2.7 g/dL — ABNORMAL LOW (ref 3.5–5.0)
Alkaline Phosphatase: 37 U/L — ABNORMAL LOW (ref 38–126)
Anion gap: 13 (ref 5–15)
BUN: 27 mg/dL — ABNORMAL HIGH (ref 8–23)
CO2: 24 mmol/L (ref 22–32)
Calcium: 8.8 mg/dL — ABNORMAL LOW (ref 8.9–10.3)
Chloride: 105 mmol/L (ref 98–111)
Creatinine, Ser: 1.16 mg/dL (ref 0.61–1.24)
GFR, Estimated: >60 mL/min (ref >60)
Glucose, Bld: 111 mg/dL — ABNORMAL HIGH (ref 70–99)
Potassium: 3.9 mmol/L (ref 3.5–5.1)
Sodium: 142 mmol/L (ref 135–145)
Total Bilirubin: 0.4 mg/dL (ref 0.3–1.2)
Total Protein: 6.5 g/dL (ref 6.5–8.1)

## 2022-12-25 MED ORDER — ADULT MULTIVITAMIN W/MINERALS CH
1.0000 | ORAL_TABLET | Freq: Every day | ORAL | Status: DC
  Administered 2022-12-26 – 2022-12-27 (×2): 1 via ORAL
  Filled 2022-12-25 (×2): qty 1

## 2022-12-25 NOTE — Progress Notes (Signed)
Initial Nutrition Assessment  DOCUMENTATION CODES:   Underweight  INTERVENTION:  - DYS 1 diet per MD. - Ensure Plus High Protein po TID, each supplement provides 350 kcal and 20 grams of protein. - Encourage intake at all meals and of supplements.  - Multivitamin with minerals daily - Monitor weight trends. - Complete NFPE when able.   NUTRITION DIAGNOSIS:   Increased nutrient needs related to chronic illness, other (see comment) (underweight with BMI 16.97) as evidenced by estimated needs.  GOAL:   Patient will meet greater than or equal to 90% of their needs  MONITOR:   PO intake, Supplement acceptance, Diet advancement, Weight trends  REASON FOR ASSESSMENT:   Consult Assessment of nutrition requirement/status  ASSESSMENT:   72 y.o. male admits related to gross hematuria and AMS. PMH includes: acute ischemic stroke, arthritis, asthma, cancer, CAP, GERD, HTN. Pt is currently receiving medical management related to UTI obstruction.  Patient noted to have a history of dementia per MD notes and noted to have encephalopathy this admission. RN reports patient has been making sense when asked questions. Attempted to speak with patient via bedside telephone but no answer.  Per chart review, weight without significant changes until the past 1 month. Patient documented to be 144# on 6/6 and now weighed at 121#. This could be a 23# or 16% weight loss in ~1.5 months, which would be significant and severe for the time frame. However, weight of 144# is an outlier from previous weight history so unsure of exact weight changes at this time.   Patient eating fairly well since admit. Diet downgraded to DYS 1 on 7/14 and patient noted to be consuming an average of 71% of meals over the past 3 days. He is also drinking Ensure each time it is offered. Will add multivitamin to support micronutrient needs.   Suspect patient is likely malnourished but unable to complete NFPE at this time to  determine fat and muscle wasting.    Medications reviewed and include: -  Labs reviewed:  -   NUTRITION - FOCUSED PHYSICAL EXAM:  RD working remotely  Diet Order:   Diet Order             DIET - DYS 1 Room service appropriate? Yes; Fluid consistency: Thin  Diet effective now                   EDUCATION NEEDS:  Not appropriate for education at this time  Skin:  Skin Assessment: Reviewed RN Assessment Skin Integrity Issues:: Stage II Stage II: L ankle  Last BM:  PTA  Height:  Ht Readings from Last 1 Encounters:  11/14/22 5\' 11"  (1.803 m)   Weight:  Wt Readings from Last 1 Encounters:  12/24/22 55.2 kg    BMI:  Body mass index is 16.97 kg/m.  Estimated Nutritional Needs:  Kcal:  1650-1950 kcals Protein:  80-100 gm Fluid:  >/= 1.6 L    Shelle Iron RD, LDN For contact information, refer to [facility].

## 2022-12-25 NOTE — Plan of Care (Signed)
  Problem: Education: Goal: Knowledge of General Education information will improve Description: Including pain rating scale, medication(s)/side effects and non-pharmacologic comfort measures 12/25/2022 0633 by Oswald Hillock, RN Outcome: Progressing 12/25/2022 0632 by Oswald Hillock, RN Outcome: Progressing

## 2022-12-25 NOTE — Plan of Care (Signed)
  Problem: Nutrition: Goal: Adequate nutrition will be maintained 12/25/2022 0634 by Oswald Hillock, RN Outcome: Progressing 12/25/2022 0633 by Oswald Hillock, RN Outcome: Progressing 12/25/2022 0632 by Oswald Hillock, RN Outcome: Progressing

## 2022-12-25 NOTE — Progress Notes (Signed)
PROGRESS NOTE    Jason Jones  ONG:[id number] DOB: Sep 02, 1950 DOA: 12/21/2022 PCP: [name], NP    Chief Complaint  Patient presents with   Altered Mental Status    Per EMS, RN at facility changed pt's foley today and noticed that pt was pulling on it which is not his baseline. Per RN after pt seemed somewhat "aggressive with it" she noticed some blood in the catheter bag. Per RN, pt also has had poor oral intake today    Brief Narrative:  Patient is a 72 year old chronically ill-appearing elderly AAM with a past medical history significant for prostate cancer, BPH, chronic Foley catheter, history of CVA as well as other comorbidities who presented from his nursing facility for the evaluation of altered mental status and gross hematuria.  Reportedly had his Foley catheter changed at this facility yesterday and was draining only frank blood from the catheter since then and was noted to be less conversant, more confused and not eating as much.  Given his symptoms he was sent to the ED for further evaluation he was found to be afebrile and saturating well on room air.  Labs were notable for a BUN of 60 and a creatinine of 3.97.  Ultrasound showed and revealed significant urinary retention.  Foley catheter was exchanged in the ED and now draining a significant amount of nonbloody urine.  There is concern for a urinary tract infection so he is initiated on Levaquin and is given IV fluid hydration.   Urine Cx showing GNR and Staph Aureus with Sensitivities as below.  She is getting no real good p.o. options given that the extent of his microbial UTI so I discussed the case with infectious disease pharmacist who recommended a dose of gentamicin which has been ordered.  Will need to watch his renal function carefully but he is slowly improving. Anticipating D/C back to his facility in 24-48 hours pending Cx and further Renal Improvement.    Assessment & Plan:   Principal Problem:   Acute renal  failure superimposed on stage 3a chronic kidney disease (HCC) Active Problems:   Normocytic anemia   Primary hypertension   History of CVA (cerebrovascular accident)   Urinary retention   Acute metabolic encephalopathy   Acute cystitis with hematuria  #1 acute kidney injury on CKD stage IIIa/polymicrobial UTI/elevated anion gap metabolic acidosis -Patient noted on admission to have a creatinine up to 3.97 up from 1.082 weeks prior to admission. -Acute kidney injury felt secondary to postrenal azotemia secondary to malpositioning/obstructing Foley catheter. -Patient on presentation had presented with hematuria. -Foley catheter changed in the ED and patient initially draining purulent but nonbloody urine. -Urinalysis concerning for UTI, urine cultures polymicrobial with greater than 100,000 colonies of MRSA, Citrobacter freundii, Enterobacter intermedius. -Patient initially placed on Levaquin in the ED, also received a dose of gentamicin. -Urine output of 950 cc over the past 24 hours. -Renal function improved creatinine down to 1.16 today. -Was on IV fluids which has subsequently been discontinued. -ID consulted for antibiotic recommendations and duration, patient seen in consultation by ID today, Dr. [name] who recommended no further antibiotics needed at this time. -Supportive care.  2.  History of CVA -Continue aspirin for secondary stroke prophylaxis and statin.  3.  Dementia -Delirium precautions. -Continue olanzapine.  4.  Hypertension -Atenolol.  5.  Acute metabolic encephalopathy -Likely secondary to acute urinary retention and azotemia. -Foley catheter changed on presentation, renal function improving, mental status improving. -Supportive care.  6.  Normocytic anemia  -  Hemoglobin currently stable at 8.0. -Patient with no overt bleeding. -Noted to have had a recent positive FOBT and CT angiogram GI bleed which showed no definite evidence of acute gastrointestinal  hemorrhage. -Follow H&H. -Transfusion threshold hemoglobin < 7.  7.  Underweight -BMI 16.97 kg/m. -Dietitian consulted. -Continue nutritional supplementation.  8.  Pressure injury, POA Pressure Injury 12/22/22 Ankle Anterior;Left Stage 2 -  Partial thickness loss of dermis presenting as a shallow open injury with a red, pink wound bed without slough. healing (Active)  12/22/22 0448  Location: Ankle  Location Orientation: Anterior;Left  Staging: Stage 2 -  Partial thickness loss of dermis presenting as a shallow open injury with a red, pink wound bed without slough.  Wound Description (Comments): healing  Present on Admission: Yes        DVT prophylaxis: SCDs Code Status: DNR Family Communication: Updated patient.  No family at bedside. Disposition: Back to SNF hopefully in the next 24 hours.  Status is: Inpatient Remains inpatient appropriate because: Severity of illness   Consultants:  ID: Dr. [name] 12/25/2022  Procedures:  None  Antimicrobials:  Anti-infectives (From admission, onward)    Start     Dose/Rate Route Frequency Ordered Stop   12/24/22 1200  gentamicin (GARAMYCIN) 280 mg in dextrose 5 % 100 mL IVPB        5 mg/kg  55.2 kg 107 mL/hr over 60 Minutes Intravenous  Once 12/24/22 1113 12/24/22 1324   12/23/22 2230  levofloxacin (LEVAQUIN) tablet 500 mg  Status:  Discontinued        500 mg Oral Every 48 hours 12/21/22 2217 12/24/22 1113   12/21/22 2230  levofloxacin (LEVAQUIN) IVPB 500 mg        500 mg 100 mL/hr over 60 Minutes Intravenous  Once 12/21/22 2228 12/22/22 0109   12/21/22 2215  levofloxacin (LEVAQUIN) IVPB 750 mg  Status:  Discontinued        750 mg 100 mL/hr over 90 Minutes Intravenous  Once 12/21/22 2217 12/21/22 2227         Subjective: Laying in bed, just finished eating his lunch.  Denies any chest pain or shortness of breath.  No abdominal pain.  Overall feeling better than on admission.  Foley catheter in place draining clear  urine.  Objective: Vitals:   12/25/22 0500 12/25/22 0809 12/25/22 1708 12/25/22 2117  BP: (!) 134/92 107/74 98/60 (!) 140/73  Pulse: 77 86 91 88  Resp: 18 17 16 18   Temp: 98.5 F (36.9 C) 98.5 F (36.9 C) 98.7 F (37.1 C) 98.2 F (36.8 C)  TempSrc: Oral Oral Oral Oral  SpO2: 95% 94% 94% 96%  Weight:        Intake/Output Summary (Last 24 hours) at 12/25/2022 2148 Last data filed at 12/25/2022 1655 Gross per 24 hour  Intake 557 ml  Output 1450 ml  Net -893 ml   Filed Weights   12/22/22 0300 12/24/22 0500  Weight: 55.6 kg 55.2 kg    Examination:  General exam: Appears calm and comfortable  Respiratory system: Clear to auscultation anterior lung fields.Marland Kitchen Respiratory effort normal. Cardiovascular system: S1 & S2 heard, RRR. No JVD, murmurs, rubs, gallops or clicks. No pedal edema. Gastrointestinal system: Abdomen is nondistended, soft and nontender. No organomegaly or masses felt. Normal bowel sounds heard. GU: Foley catheter in place draining clear urine. Central nervous system: Alert and oriented.  Left upper extremity contracture and weakness.  Extremities: Symmetric 5 x 5 power. Skin: No rashes, lesions or ulcers  Psychiatry: Judgement and insight appear normal. Mood & affect appropriate.     Data Reviewed: I have personally reviewed following labs and imaging studies  CBC: Recent Labs  Lab 12/21/22 2021 12/22/22 0221 12/23/22 0124 12/24/22 0109 12/25/22 0311  WBC 7.1 7.9 5.8 6.2 6.8  NEUTROABS 4.6  --  3.0 3.2 3.8  HGB 9.3* 8.6* 7.8* 7.6* 8.0*  HCT 29.2* 27.0* 25.6* 24.1* 25.1*  MCV 93.6 92.5 95.5 94.1 93.7  PLT 266 249 248 238 255    Basic Metabolic Panel: Recent Labs  Lab 12/21/22 2021 12/22/22 0221 12/23/22 0124 12/24/22 0109 12/25/22 0311  NA 141 139 143 139 142  K 4.4 3.9 4.1 3.8 3.9  CL 104 108 116* 107 105  CO2 20* 21* 20* 23 24  GLUCOSE 138* 115* 102* 130* 111*  BUN 60* 52* 37* 33* 27*  CREATININE 3.97* 3.00* 1.68* 1.16 1.16  CALCIUM  9.0 8.6* 8.4* 8.3* 8.8*  MG  --   --  2.1 1.9 1.8  PHOS  --   --  2.9 3.3 3.7    GFR: Estimated Creatinine Clearance: 44.9 mL/min (by C-G formula based on SCr of 1.16 mg/dL).  Liver Function Tests: Recent Labs  Lab 12/23/22 0124 12/24/22 0109 12/25/22 0311  AST 25 27 25   ALT 23 28 26   ALKPHOS 34* 38 37*  BILITOT 0.3 0.2* 0.4  PROT 6.7 6.5 6.5  ALBUMIN 2.7* 2.6* 2.7*    CBG: No results for input(s): "GLUCAP" in the last 168 hours.   Recent Results (from the past 240 hour(s))  Urine Culture (for pregnant, neutropenic or urologic patients or patients with an indwelling urinary catheter)     Status: Abnormal   Collection Time: 12/22/22  8:48 AM   Specimen: Urine, Catheterized  Result Value Ref Range Status   Specimen Description URINE, CATHETERIZED  Final   Special Requests   Final    NONE Performed at [facility], [address], [place], [place] [address]    Culture (A)  Final    >=100,000 COLONIES/mL METHICILLIN RESISTANT STAPHYLOCOCCUS AUREUS >=100,000 COLONIES/mL CITROBACTER FREUNDII 50,000 COLONIES/mL ENTEROBACTER INTERMEDIUS    Report Status 12/24/2022 FINAL  Final   Organism ID, Bacteria METHICILLIN RESISTANT STAPHYLOCOCCUS AUREUS (A)  Final   Organism ID, Bacteria CITROBACTER FREUNDII (A)  Final   Organism ID, Bacteria ENTEROBACTER INTERMEDIUS (A)  Final      Susceptibility   Citrobacter freundii - MIC*    CEFEPIME <=0.12 SENSITIVE Sensitive     CEFTRIAXONE <=0.25 SENSITIVE Sensitive     CIPROFLOXACIN 2 RESISTANT Resistant     GENTAMICIN <=1 SENSITIVE Sensitive     IMIPENEM 0.5 SENSITIVE Sensitive     NITROFURANTOIN <=16 SENSITIVE Sensitive     TRIMETH/SULFA >=320 RESISTANT Resistant     PIP/TAZO 8 SENSITIVE Sensitive     * >=100,000 COLONIES/mL CITROBACTER FREUNDII   Enterobacter intermedius - MIC*    AMPICILLIN >=32 RESISTANT Resistant     CEFEPIME <=0.12 SENSITIVE Sensitive     CEFTRIAXONE <=0.25 SENSITIVE Sensitive     CIPROFLOXACIN 2  RESISTANT Resistant     GENTAMICIN <=1 SENSITIVE Sensitive     IMIPENEM <=0.25 SENSITIVE Sensitive     NITROFURANTOIN 256 RESISTANT Resistant     TRIMETH/SULFA >=320 RESISTANT Resistant     AMPICILLIN/SULBACTAM 16 INTERMEDIATE Intermediate     PIP/TAZO <=4 SENSITIVE Sensitive     * 50,000 COLONIES/mL ENTEROBACTER INTERMEDIUS   Methicillin resistant staphylococcus aureus - MIC*    CIPROFLOXACIN >=8 RESISTANT  Resistant     GENTAMICIN <=0.5 SENSITIVE Sensitive     NITROFURANTOIN <=16 SENSITIVE Sensitive     OXACILLIN >=4 RESISTANT Resistant     TETRACYCLINE <=1 SENSITIVE Sensitive     VANCOMYCIN <=0.5 SENSITIVE Sensitive     TRIMETH/SULFA <=10 SENSITIVE Sensitive     CLINDAMYCIN >=8 RESISTANT Resistant     RIFAMPIN <=0.5 SENSITIVE Sensitive     Inducible Clindamycin NEGATIVE Sensitive     LINEZOLID 2 SENSITIVE Sensitive     * >=100,000 COLONIES/mL METHICILLIN RESISTANT STAPHYLOCOCCUS AUREUS  MRSA Next Gen by PCR, Nasal     Status: Abnormal   Collection Time: 12/23/22 11:14 AM   Specimen: Nasal Mucosa; Nasal Swab  Result Value Ref Range Status   MRSA by PCR Next Gen DETECTED (A) NOT DETECTED Final    Comment: RESULT CALLED TO, READ BACK BY AND VERIFIED WITH: RN KELLY DUFFY ON 12/23/22 @ 1609 BY DRT (NOTE) The GeneXpert MRSA Assay (FDA approved for NASAL specimens only), is one component of a comprehensive MRSA colonization surveillance program. It is not intended to diagnose MRSA infection nor to guide or monitor treatment for MRSA infections. Test performance is not FDA approved in patients less than 21 years old. Performed at [facility], [address], [place], [place] [address]          Radiology Studies: No results found.      Scheduled Meds:  aspirin EC  81 mg Oral Daily   atenolol  12.5 mg Oral Daily   baclofen  5 mg Oral BID   Chlorhexidine Gluconate Cloth  6 each Topical Daily   doxazosin  1 mg Oral Daily   feeding supplement  237 mL Oral TID BM    iron polysaccharides  150 mg Oral Daily   [START ON 12/26/2022] multivitamin with minerals  1 tablet Oral Daily   mupirocin ointment  1 Application Nasal BID   OLANZapine  2.5 mg Oral q morning   OLANZapine zydis  5 mg Oral QHS   rosuvastatin  10 mg Oral Daily   Continuous Infusions:   LOS: 4 days    Time spent: 35 minutes    Ramiro Harvest, MD [facility]   To contact the attending provider between 7A-7P or the covering provider during after hours 7P-7A, please log into the web site [email or web address] and access using universal [place] password for that web site. If you do not have the password, please call the hospital operator.  12/25/2022, 9:48 PM

## 2022-12-25 NOTE — Consult Note (Signed)
[facility]       Reason for Consult:positive urine culture    Referring Physician: Dr. Janee Morn  Principal Problem:   Acute renal failure superimposed on stage 3a chronic kidney disease (HCC) Active Problems:   Primary hypertension   History of CVA (cerebrovascular accident)   Normocytic anemia   Urinary retention   Acute metabolic encephalopathy    aspirin EC  81 mg Oral Daily   atenolol  12.5 mg Oral Daily   baclofen  5 mg Oral BID   Chlorhexidine Gluconate Cloth  6 each Topical Daily   doxazosin  1 mg Oral Daily   feeding supplement  237 mL Oral TID BM   iron polysaccharides  150 mg Oral Daily   mupirocin ointment  1 Application Nasal BID   OLANZapine  2.5 mg Oral q morning   OLANZapine zydis  5 mg Oral QHS   rosuvastatin  10 mg Oral Daily    Recommendations: No antibiotic treatment indicated  Assessment: He had a urinary obstruction with his chronic foley catheter and resolved after catheter exchange.  No signs of infection with no fever, no leukocytosis.  UA and urine culture noted and as expected in someone with a chronic foley.    Antibiotics: Levaquin x 2 doses Gentamicin x 1 dose  HPI: Jason Jones is a 72 y.o. male with a history of prostate cancer and a chronic foley catheter presented with altered mental status and gross hematuria through his catheter.  BUN 60 and creat up to 3.97 on admission, both up from his baseline.  Ultrasound with urinary obstruction.  He underwent catheter exchange and improved, now back to his baseline.  Creat now wnl and no further hematuria.  WBC remained normal, no fever.    Review of Systems:  Constitutional: negative for fevers and chills All other systems reviewed and are negative    Past Medical History:  Diagnosis Date   Acute ischemic right PCA stroke (HCC) 03/25/2022   Arthritis    Asthma    Cancer (HCC)    CAP (community acquired pneumonia) 07/20/2022   Chronic alcohol abuse     Dyspnea    GERD (gastroesophageal reflux disease)    PRN  ---  TAKES BAKING SODA IN WATER   History of pneumothorax    04-28-2006  fell, left fx rib--  resolved w/ chest tube   Hypertension    Nocturia    Poor dental hygiene    Right inguinal hernia    Weak urinary stream     Social History   Tobacco Use   Smoking status: Every Day    Current packs/day: 0.50    Average packs/day: 0.5 packs/day for 50.0 years (25.0 ttl pk-yrs)    Types: Cigarettes   Smokeless tobacco: Never  Vaping Use   Vaping status: Never Used  Substance Use Topics   Alcohol use: Not Currently    Comment: last use 2-3 months ago ,   Drug use: Not Currently    Types: Marijuana    Comment: last use 2 years ago per pt    Family History  Problem Relation Age of Onset   Prostate cancer Cousin    Prostate cancer Cousin     Allergies  Allergen Reactions   Ceftriaxone Swelling and Other (See Comments)    Possible lips swelling (dose 07/21/22) "Allergic," per facility   Doxycycline Other (See Comments)    "ALLERGIC," per facility document    Physical Exam: Constitutional: in  no apparent distress  Vitals:   12/25/22 0500 12/25/22 0809  BP: (!) 134/92 107/74  Pulse: 77 86  Resp: 18 17  Temp: 98.5 F (36.9 C) 98.5 F (36.9 C)  SpO2: 95% 94%   EYES: anicteric Respiratory: normal respiratory effort Musculoskeletal: no edema   Lab Results  Component Value Date   WBC 6.8 12/25/2022   HGB 8.0 (L) 12/25/2022   HCT 25.1 (L) 12/25/2022   MCV 93.7 12/25/2022   PLT 255 12/25/2022    Lab Results  Component Value Date   CREATININE 1.16 12/25/2022   BUN 27 (H) 12/25/2022   NA 142 12/25/2022   K 3.9 12/25/2022   CL 105 12/25/2022   CO2 24 12/25/2022    Lab Results  Component Value Date   ALT 26 12/25/2022   AST 25 12/25/2022   ALKPHOS 37 (L) 12/25/2022     Microbiology: Recent Results (from the past 240 hour(s))  Urine Culture (for pregnant, neutropenic or urologic patients or patients  with an indwelling urinary catheter)     Status: Abnormal   Collection Time: 12/22/22  8:48 AM   Specimen: Urine, Catheterized  Result Value Ref Range Status   Specimen Description URINE, CATHETERIZED  Final   Special Requests   Final    NONE Performed at [facility], [address] [place], [place] [address]    Culture (A)  Final    >=100,000 COLONIES/mL METHICILLIN RESISTANT STAPHYLOCOCCUS AUREUS >=100,000 COLONIES/mL CITROBACTER FREUNDII 50,000 COLONIES/mL ENTEROBACTER INTERMEDIUS    Report Status 12/24/2022 FINAL  Final   Organism ID, Bacteria METHICILLIN RESISTANT STAPHYLOCOCCUS AUREUS (A)  Final   Organism ID, Bacteria CITROBACTER FREUNDII (A)  Final   Organism ID, Bacteria ENTEROBACTER INTERMEDIUS (A)  Final      Susceptibility   Citrobacter freundii - MIC*    CEFEPIME <=0.12 SENSITIVE Sensitive     CEFTRIAXONE <=0.25 SENSITIVE Sensitive     CIPROFLOXACIN 2 RESISTANT Resistant     GENTAMICIN <=1 SENSITIVE Sensitive     IMIPENEM 0.5 SENSITIVE Sensitive     NITROFURANTOIN <=16 SENSITIVE Sensitive     TRIMETH/SULFA >=320 RESISTANT Resistant     PIP/TAZO 8 SENSITIVE Sensitive     * >=100,000 COLONIES/mL CITROBACTER FREUNDII   Enterobacter intermedius - MIC*    AMPICILLIN >=32 RESISTANT Resistant     CEFEPIME <=0.12 SENSITIVE Sensitive     CEFTRIAXONE <=0.25 SENSITIVE Sensitive     CIPROFLOXACIN 2 RESISTANT Resistant     GENTAMICIN <=1 SENSITIVE Sensitive     IMIPENEM <=0.25 SENSITIVE Sensitive     NITROFURANTOIN 256 RESISTANT Resistant     TRIMETH/SULFA >=320 RESISTANT Resistant     AMPICILLIN/SULBACTAM 16 INTERMEDIATE Intermediate     PIP/TAZO <=4 SENSITIVE Sensitive     * 50,000 COLONIES/mL ENTEROBACTER INTERMEDIUS   Methicillin resistant staphylococcus aureus - MIC*    CIPROFLOXACIN >=8 RESISTANT Resistant     GENTAMICIN <=0.5 SENSITIVE Sensitive     NITROFURANTOIN <=16 SENSITIVE Sensitive     OXACILLIN >=4 RESISTANT Resistant     TETRACYCLINE <=1 SENSITIVE  Sensitive     VANCOMYCIN <=0.5 SENSITIVE Sensitive     TRIMETH/SULFA <=10 SENSITIVE Sensitive     CLINDAMYCIN >=8 RESISTANT Resistant     RIFAMPIN <=0.5 SENSITIVE Sensitive     Inducible Clindamycin NEGATIVE Sensitive     LINEZOLID 2 SENSITIVE Sensitive     * >=100,000 COLONIES/mL METHICILLIN RESISTANT STAPHYLOCOCCUS AUREUS  MRSA Next Gen by PCR, Nasal     Status: Abnormal  Collection Time: 12/23/22 11:14 AM   Specimen: Nasal Mucosa; Nasal Swab  Result Value Ref Range Status   MRSA by PCR Next Gen DETECTED (A) NOT DETECTED Final    Comment: RESULT CALLED TO, READ BACK BY AND VERIFIED WITH: RN KELLY DUFFY ON 12/23/22 @ 1609 BY DRT (NOTE) The GeneXpert MRSA Assay (FDA approved for NASAL specimens only), is one component of a comprehensive MRSA colonization surveillance program. It is not intended to diagnose MRSA infection nor to guide or monitor treatment for MRSA infections. Test performance is not FDA approved in patients less than 7 years old. Performed at [facility], [address], [place], [place] [address]     Gardiner Barefoot, MD [facility] [email or web address] 12/25/2022, 1:34 PM

## 2022-12-26 DIAGNOSIS — R8271 Bacteriuria: Secondary | ICD-10-CM

## 2022-12-26 DIAGNOSIS — I10 Essential (primary) hypertension: Secondary | ICD-10-CM | POA: Diagnosis not present

## 2022-12-26 DIAGNOSIS — R339 Retention of urine, unspecified: Secondary | ICD-10-CM | POA: Diagnosis not present

## 2022-12-26 DIAGNOSIS — N179 Acute kidney failure, unspecified: Secondary | ICD-10-CM | POA: Diagnosis not present

## 2022-12-26 DIAGNOSIS — D649 Anemia, unspecified: Secondary | ICD-10-CM | POA: Diagnosis not present

## 2022-12-26 LAB — BASIC METABOLIC PANEL
Anion gap: 11 (ref 5–15)
BUN: 30 mg/dL — ABNORMAL HIGH (ref 8–23)
CO2: 27 mmol/L (ref 22–32)
Calcium: 8.9 mg/dL (ref 8.9–10.3)
Chloride: 103 mmol/L (ref 98–111)
Creatinine, Ser: 1.14 mg/dL (ref 0.61–1.24)
GFR, Estimated: >60 mL/min (ref >60)
Glucose, Bld: 113 mg/dL — ABNORMAL HIGH (ref 70–99)
Potassium: 4.1 mmol/L (ref 3.5–5.1)
Sodium: 141 mmol/L (ref 135–145)

## 2022-12-26 LAB — CBC
HCT: 25.5 % — ABNORMAL LOW (ref 39.0–52.0)
Hemoglobin: 7.9 g/dL — ABNORMAL LOW (ref 13.0–17.0)
MCH: 29.2 pg (ref 26.0–34.0)
MCHC: 31 g/dL (ref 30.0–36.0)
MCV: 94.1 fL (ref 80.0–100.0)
Platelets: 291 10*3/uL (ref 150–400)
RBC: 2.71 MIL/uL — ABNORMAL LOW (ref 4.22–5.81)
RDW: 15.4 % (ref 11.5–15.5)
WBC: 7.5 10*3/uL (ref 4.0–10.5)
nRBC: 0 % (ref 0.0–0.2)

## 2022-12-26 MED ORDER — FERROUS GLUCONATE 324 (38 FE) MG PO TABS: 324.0000 mg | ORAL_TABLET | Freq: Every day | ORAL | Status: AC

## 2022-12-26 MED ORDER — BACLOFEN 10 MG PO TABS: 5.0000 mg | ORAL_TABLET | Freq: Two times a day (BID) | ORAL | 0 refills | Status: DC

## 2022-12-26 NOTE — Discharge Summary (Signed)
Physician Discharge Summary  Jason Jones ZOX:[id number] DOB: 01/25/1951 DOA: 12/21/2022  PCP: [name], NP  Admit date: 12/21/2022 Discharge date: 12/26/2022  Time spent: 55 minutes  Recommendations for Outpatient Follow-up:  Follow-up with MD at SNF.  Patient will need basic metabolic profile and CBC done in 1 week to follow-up on electrolytes, renal function and counts. Follow-up with [name], NP in 2 weeks.   Discharge Diagnoses:  Principal Problem:   Acute renal failure superimposed on stage 3a chronic kidney disease (HCC) Active Problems:   Normocytic anemia   Primary hypertension   History of CVA (cerebrovascular accident)   Urinary retention   Acute metabolic encephalopathy   Acute cystitis with hematuria   Bacteriuria   Discharge Condition: Stable and improved.  Diet recommendation: Heart healthy/dysphagia 1 diet.  Filed Weights   12/22/22 0300 12/24/22 0500  Weight: 55.6 kg 55.2 kg    History of present illness:  HPI per Dr. Michela Jones is a 72 y.o. male with medical history significant for prostate cancer, BPH, chronic Foley catheter, and history of CVA who presents from his nursing facility for evaluation of altered mental status and gross hematuria.   Patient reportedly had his Foley catheter changed at his facility yesterday, was draining only frank blood from the catheter since then, and was then noted to be less conversant, more confused, and not eating much today.   ED Course: Upon arrival to the ED, patient is found to be afebrile and saturating well on room air with normal heart rate and stable blood pressure.  Labs are most notable for BUN 60 and creatinine 3.97.  Ultrasound revealed significant urinary retention.  Foley catheter was exchanged in the ED and is now draining significant amount of non-bloody urine.   Patient was given a liter of LR.  Hospital Course:  #1 acute kidney injury on CKD stage IIIa/polymicrobial UTI versus  bacteriuria/elevated anion gap metabolic acidosis -Patient noted on admission to have a creatinine up to 3.97 up from 1.082 weeks prior to admission. -Acute kidney injury felt secondary to postrenal azotemia secondary to malpositioning/obstructing Foley catheter. -Patient on presentation had presented with hematuria. -Foley catheter changed in the ED and patient initially draining purulent but nonbloody urine. -Urinalysis concerning for UTI, urine cultures polymicrobial with greater than 100,000 colonies of MRSA, Citrobacter freundii, Enterobacter intermedius. -Patient initially placed on Levaquin in the ED and received 2 doses of Levaquin, also received a dose of gentamicin. -Patient with good urine output, renal function improved with IV fluids, such that by day of discharge creatinine was down to 1.14 from 3.97 on admission.  -ID consulted for antibiotic recommendations and duration, patient seen in consultation by ID, Dr. [name] who recommended no further antibiotics needed at this time as patient's urine improved with exchange of Foley catheter and cultures per ID felt likely secondary to colonization in patient with chronic Foley catheter. -Patient remained afebrile, no leukocytosis and remained in stable and improved condition after Foley catheter exchange.   2.  History of CVA -Patient maintained on aspirin for secondary stroke prophylaxis as well as statin.   -Outpatient follow-up.     3.  Dementia -Delirium precautions. -Patient maintained on home regimen olanzapine.    4.  Hypertension -Patient maintained on home regimen atenolol during the hospitalization.    5.  Acute metabolic encephalopathy -Likely secondary to acute urinary retention and azotemia. -Foley catheter changed on presentation, renal function improved, mental status improved, and was likely at baseline by day of  discharge.   6.  Normocytic anemia  -Hemoglobin remained stable during the hospitalization and was 7.9  by day of discharge.  -Patient with no overt bleeding. -Noted to have had a recent positive FOBT and CT angiogram GI bleed which showed no definite evidence of acute gastrointestinal hemorrhage. -Patient maintained on oral iron supplementation. -Outpatient follow-up.   7.  Underweight -BMI 16.97 kg/m. -Dietitian consulted. -Patient maintained on nutritional supplementation.   -Outpatient follow-up.     8.  Pressure injury, POA Pressure Injury 12/22/22 Ankle Anterior;Left Stage 2 -  Partial thickness loss of dermis presenting as a shallow open injury with a red, pink wound bed without slough. healing (Active)  12/22/22 0448  Location: Ankle  Location Orientation: Anterior;Left  Staging: Stage 2 -  Partial thickness loss of dermis presenting as a shallow open injury with a red, pink wound bed without slough.  Wound Description (Comments): healing  Present on Admission: Yes         Procedures: None  Consultations: ID: Dr. [name] 12/25/2022  Discharge Exam: Vitals:   12/26/22 0509 12/26/22 0835  BP: (!) 142/91 114/65  Pulse: 96 96  Resp: 18 18  Temp: 98.4 F (36.9 C) 98 F (36.7 C)  SpO2: 95% 98%    General: NAD Cardiovascular: RRR no murmurs rubs or gallops.  No JVD.  No lower extremity edema. Respiratory: CTAB anterior lung fields.  Discharge Instructions   Discharge Instructions     Diet - low sodium heart healthy   Complete by: As directed    Dysphagia 1 diet with thin liquids   Discharge wound care:   Complete by: As directed    Pressure Injury 12/22/22 Ankle Anterior;Left Stage 2 -  Partial thickness loss of dermis presenting as a shallow open injury with a red, pink wound bed without slough. healing   Increase activity slowly   Complete by: As directed       Allergies as of 12/26/2022       Reactions   Ceftriaxone Swelling, Other (See Comments)   Possible lips swelling (dose 07/21/22) "Allergic," per facility   Doxycycline Other (See Comments)    "ALLERGIC," per facility document        Medication List     TAKE these medications    acetaminophen 650 MG CR tablet Commonly known as: TYLENOL Take 650 mg by mouth as needed for pain or fever.   acetaminophen 500 MG tablet Commonly known as: TYLENOL Take 2 tablets (1,000 mg total) by mouth every 8 (eight) hours as needed for mild pain, headache or moderate pain.   albuterol (2.5 MG/3ML) 0.083% nebulizer solution Commonly known as: PROVENTIL Take 3 mLs (2.5 mg total) by nebulization every 4 (four) hours as needed for wheezing or shortness of breath. What changed: when to take this   AQUAPHOR OINTMENT BODY EX Apply 1 application  topically See admin instructions. Apply to the lower legs and feet every shift   aspirin 81 MG chewable tablet Chew 1 tablet (81 mg total) by mouth daily. What changed: when to take this   atenolol 25 MG tablet Commonly known as: TENORMIN Take 12.5 mg by mouth in the morning.   b complex vitamins capsule Take 1 capsule by mouth in the morning.   baclofen 10 MG tablet Commonly known as: LIORESAL Take 0.5 tablets (5 mg total) by mouth 2 (two) times daily. Take 7.5 mg by mouth in the morning and at 5 PM What changed: how much to take   clemastine  2.68 MG Tabs tablet Commonly known as: TAVIST Take 2.68 mg by mouth 2 (two) times daily. 1 tablet twice daily for seasonal allergies rhinitis   doxazosin 1 MG tablet Commonly known as: CARDURA Take 1 mg by mouth in the morning.   feeding supplement Liqd Take 237 mLs by mouth 2 (two) times daily between meals.   ferrous gluconate 324 MG tablet Commonly known as: FERGON Take 1 tablet (324 mg total) by mouth daily with breakfast.   guaiFENesin 100 MG/5ML liquid Commonly known as: ROBITUSSIN Take 15 mLs by mouth every 4 (four) hours as needed for cough or to loosen phlegm. 15 ml q 4h as needed for cough   loperamide 2 MG tablet Commonly known as: IMODIUM A-D Take 2 mg by mouth 4 (four) times  daily as needed for diarrhea or loose stools. Use as needed for loose stool   NON FORMULARY Take 120 mLs by mouth See admin instructions. MedPass 2.0 - Drink 120 ml's by mouth three times a day   OLANZapine zydis 5 MG disintegrating tablet Commonly known as: ZYPREXA Take 1 tablet (5 mg total) by mouth at bedtime.   OLANZapine 2.5 MG tablet Commonly known as: ZyPREXA Take 1 tablet (2.5 mg total) by mouth every morning.   polyethylene glycol 17 g packet Commonly known as: MIRALAX / GLYCOLAX Take 17 g by mouth daily as needed. What changed: reasons to take this   promethazine 25 MG tablet Commonly known as: PHENERGAN Take 25 mg by mouth every 4 (four) hours as needed for nausea or vomiting. As Needed   rosuvastatin 20 MG tablet Commonly known as: CRESTOR Take 1 tablet (20 mg total) by mouth daily. What changed: additional instructions   senna-docusate 8.6-50 MG tablet Commonly known as: Senokot-S Take 1 tablet by mouth at bedtime.   sodium chloride 0.65 % Soln nasal spray Commonly known as: OCEAN Place 1 spray into both nostrils as needed for congestion.               Discharge Care Instructions  (From admission, onward)           Start     Ordered   12/26/22 0000  Discharge wound care:       Comments: Pressure Injury 12/22/22 Ankle Anterior;Left Stage 2 -  Partial thickness loss of dermis presenting as a shallow open injury with a red, pink wound bed without slough. healing   12/26/22 1444           Allergies  Allergen Reactions   Ceftriaxone Swelling and Other (See Comments)    Possible lips swelling (dose 07/21/22) "Allergic," per facility   Doxycycline Other (See Comments)    "ALLERGIC," per facility document    Follow-up Information     Connect with your PCP/Specialist as discussed. Schedule an appointment as soon as possible for a visit .   Contact information: [email or web address] Call our physician referral line at  [phone].        MD at SNF Follow up.          [name], NP. Schedule an appointment as soon as possible for a visit in 2 week(s).   Specialty: Nurse Practitioner Contact information: [address] [place] [address] [phone]                  The results of significant diagnostics from this hospitalization (including imaging, microbiology, ancillary and laboratory) are listed below for reference.    Significant Diagnostic Studies: CT ANGIO GI BLEED  Result Date: 12/06/2022 CLINICAL DATA:  Rectal bleeding. EXAM: CTA ABDOMEN AND PELVIS WITHOUT AND WITH CONTRAST TECHNIQUE: Multidetector CT imaging of the abdomen and pelvis was performed using the standard protocol during bolus administration of intravenous contrast. Multiplanar reconstructed images and MIPs were obtained and reviewed to evaluate the vascular anatomy. RADIATION DOSE REDUCTION: This exam was performed according to the departmental dose-optimization program which includes automated exposure control, adjustment of the mA and/or kV according to patient size and/or use of iterative reconstruction technique. CONTRAST:  OMNIPAQUE IOHEXOL 350 MG/ML SOLN COMPARISON:  October 02, 2022. FINDINGS: VASCULAR Aorta: Atherosclerosis of abdominal aorta is noted without aneurysm or dissection. Celiac: Patent without evidence of aneurysm, dissection, vasculitis or significant stenosis. SMA: Patent without evidence of aneurysm, dissection, vasculitis or significant stenosis. Renals: Both renal arteries are patent without evidence of aneurysm, dissection, vasculitis, fibromuscular dysplasia or significant stenosis. IMA: Patent without evidence of aneurysm, dissection, vasculitis or significant stenosis. Inflow: Patent without evidence of aneurysm, dissection, vasculitis or significant stenosis. Proximal Outflow: Moderate focal stenosis is noted involving the proximal right superficial femoral artery. There is again noted  severe stenosis involving the proximal left superficial femoral artery. Veins: No obvious venous abnormality within the limitations of this arterial phase study. Review of the MIP images confirms the above findings. NON-VASCULAR Lower chest: No acute abnormality. Hepatobiliary: No focal liver abnormality is seen. No gallstones, gallbladder wall thickening, or biliary dilatation. Pancreas: Unremarkable. No pancreatic ductal dilatation or surrounding inflammatory changes. Spleen: Normal in size without focal abnormality. Adrenals/Urinary Tract: Adrenal glands and kidneys appear normal. No hydronephrosis or renal obstruction is noted. There remains severe diffuse wall thickening of the urinary bladder which is decompressed secondary to Foley catheter. Stomach/Bowel: Stomach is within normal limits. Appendix appears normal. No evidence of bowel wall thickening, distention, or inflammatory changes. There is no evidence of contrast extravasation within the bowel lumen to suggest gastrointestinal hemorrhage. Lymphatic: No adenopathy is noted. Reproductive: Stable prostatic enlargement. Other: No abdominal wall hernia or abnormality. No abdominopelvic ascites. Musculoskeletal: No acute or significant osseous findings. IMPRESSION: VASCULAR No definite evidence active gastrointestinal hemorrhage. Moderate focal stenosis seen involving the proximal right superficial femoral artery. Severe focal stenosis seen involving proximal left superficial femoral artery. Aortic Atherosclerosis (ICD10-I70.0). NON-VASCULAR Continued severe diffuse wall thickening of urinary bladder is noted which may be due to history of chronic bladder outlet obstruction, although cystitis cannot be excluded. Urinary bladder is decompressed secondary to Foley catheter. Stable prostatic enlargement. Electronically Signed   By: Lupita Raider M.D.   On: 12/06/2022 09:44    Microbiology: Recent Results (from the past 240 hour(s))  Urine Culture (for  pregnant, neutropenic or urologic patients or patients with an indwelling urinary catheter)     Status: Abnormal   Collection Time: 12/22/22  8:48 AM   Specimen: Urine, Catheterized  Result Value Ref Range Status   Specimen Description URINE, CATHETERIZED  Final   Special Requests   Final    NONE Performed at [facility], [address], [place], [place] [address]    Culture (A)  Final    >=100,000 COLONIES/mL METHICILLIN RESISTANT STAPHYLOCOCCUS AUREUS >=100,000 COLONIES/mL CITROBACTER FREUNDII 50,000 COLONIES/mL ENTEROBACTER INTERMEDIUS    Report Status 12/24/2022 FINAL  Final   Organism ID, Bacteria METHICILLIN RESISTANT STAPHYLOCOCCUS AUREUS (A)  Final   Organism ID, Bacteria CITROBACTER FREUNDII (A)  Final   Organism ID, Bacteria ENTEROBACTER INTERMEDIUS (A)  Final      Susceptibility   Citrobacter freundii - MIC*  CEFEPIME <=0.12 SENSITIVE Sensitive     CEFTRIAXONE <=0.25 SENSITIVE Sensitive     CIPROFLOXACIN 2 RESISTANT Resistant     GENTAMICIN <=1 SENSITIVE Sensitive     IMIPENEM 0.5 SENSITIVE Sensitive     NITROFURANTOIN <=16 SENSITIVE Sensitive     TRIMETH/SULFA >=320 RESISTANT Resistant     PIP/TAZO 8 SENSITIVE Sensitive     * >=100,000 COLONIES/mL CITROBACTER FREUNDII   Enterobacter intermedius - MIC*    AMPICILLIN >=32 RESISTANT Resistant     CEFEPIME <=0.12 SENSITIVE Sensitive     CEFTRIAXONE <=0.25 SENSITIVE Sensitive     CIPROFLOXACIN 2 RESISTANT Resistant     GENTAMICIN <=1 SENSITIVE Sensitive     IMIPENEM <=0.25 SENSITIVE Sensitive     NITROFURANTOIN 256 RESISTANT Resistant     TRIMETH/SULFA >=320 RESISTANT Resistant     AMPICILLIN/SULBACTAM 16 INTERMEDIATE Intermediate     PIP/TAZO <=4 SENSITIVE Sensitive     * 50,000 COLONIES/mL ENTEROBACTER INTERMEDIUS   Methicillin resistant staphylococcus aureus - MIC*    CIPROFLOXACIN >=8 RESISTANT Resistant     GENTAMICIN <=0.5 SENSITIVE Sensitive     NITROFURANTOIN <=16 SENSITIVE Sensitive     OXACILLIN  >=4 RESISTANT Resistant     TETRACYCLINE <=1 SENSITIVE Sensitive     VANCOMYCIN <=0.5 SENSITIVE Sensitive     TRIMETH/SULFA <=10 SENSITIVE Sensitive     CLINDAMYCIN >=8 RESISTANT Resistant     RIFAMPIN <=0.5 SENSITIVE Sensitive     Inducible Clindamycin NEGATIVE Sensitive     LINEZOLID 2 SENSITIVE Sensitive     * >=100,000 COLONIES/mL METHICILLIN RESISTANT STAPHYLOCOCCUS AUREUS  MRSA Next Gen by PCR, Nasal     Status: Abnormal   Collection Time: 12/23/22 11:14 AM   Specimen: Nasal Mucosa; Nasal Swab  Result Value Ref Range Status   MRSA by PCR Next Gen DETECTED (A) NOT DETECTED Final    Comment: RESULT CALLED TO, READ BACK BY AND VERIFIED WITH: RN KELLY DUFFY ON 12/23/22 @ 1609 BY DRT (NOTE) The GeneXpert MRSA Assay (FDA approved for NASAL specimens only), is one component of a comprehensive MRSA colonization surveillance program. It is not intended to diagnose MRSA infection nor to guide or monitor treatment for MRSA infections. Test performance is not FDA approved in patients less than 78 years old. Performed at [facility], [address] [place], [place] [address]      Labs: Basic Metabolic Panel: Recent Labs  Lab 12/22/22 0221 12/23/22 0124 12/24/22 0109 12/25/22 0311 12/26/22 0037  NA 139 143 139 142 141  K 3.9 4.1 3.8 3.9 4.1  CL 108 116* 107 105 103  CO2 21* 20* 23 24 27   GLUCOSE 115* 102* 130* 111* 113*  BUN 52* 37* 33* 27* 30*  CREATININE 3.00* 1.68* 1.16 1.16 1.14  CALCIUM 8.6* 8.4* 8.3* 8.8* 8.9  MG  --  2.1 1.9 1.8  --   PHOS  --  2.9 3.3 3.7  --    Liver Function Tests: Recent Labs  Lab 12/23/22 0124 12/24/22 0109 12/25/22 0311  AST 25 27 25   ALT 23 28 26   ALKPHOS 34* 38 37*  BILITOT 0.3 0.2* 0.4  PROT 6.7 6.5 6.5  ALBUMIN 2.7* 2.6* 2.7*   Recent Labs  Lab 12/21/22 2021  LIPASE 27   No results for input(s): "AMMONIA" in the last 168 hours. CBC: Recent Labs  Lab 12/21/22 2021 12/22/22 0221 12/23/22 0124 12/24/22 0109  12/25/22 0311 12/26/22 0037  WBC 7.1 7.9 5.8 6.2 6.8 7.5  NEUTROABS 4.6  --  3.0 3.2 3.8  --   HGB 9.3* 8.6* 7.8* 7.6* 8.0* 7.9*  HCT 29.2* 27.0* 25.6* 24.1* 25.1* 25.5*  MCV 93.6 92.5 95.5 94.1 93.7 94.1  PLT 266 249 248 238 255 291   Cardiac Enzymes: No results for input(s): "CKTOTAL", "CKMB", "CKMBINDEX", "TROPONINI" in the last 168 hours. BNP: BNP (last 3 results) No results for input(s): "BNP" in the last 8760 hours.  ProBNP (last 3 results) No results for input(s): "PROBNP" in the last 8760 hours.  CBG: No results for input(s): "GLUCAP" in the last 168 hours.     Signed:  Ramiro Harvest MD.  [facility] 12/26/2022, 2:54 PM

## 2022-12-27 DIAGNOSIS — Z8673 Personal history of transient ischemic attack (TIA), and cerebral infarction without residual deficits: Secondary | ICD-10-CM | POA: Diagnosis not present

## 2022-12-27 DIAGNOSIS — I10 Essential (primary) hypertension: Secondary | ICD-10-CM | POA: Diagnosis not present

## 2022-12-27 DIAGNOSIS — N179 Acute kidney failure, unspecified: Secondary | ICD-10-CM | POA: Diagnosis not present

## 2022-12-27 DIAGNOSIS — R339 Retention of urine, unspecified: Secondary | ICD-10-CM | POA: Diagnosis not present

## 2022-12-27 NOTE — Care Management Important Message (Signed)
Important Message  Patient Details  Name: Jason Jones MRN: [id number] Date of Birth: 12-02-1950   Medicare Important Message Given:  Yes     Renie Ora 12/27/2022, 3:08 PM

## 2022-12-27 NOTE — Progress Notes (Signed)
RN attempted to give report, no answer, will continue efforts.

## 2022-12-27 NOTE — Plan of Care (Signed)
   Problem: Health Behavior/Discharge Planning: Goal: Ability to manage health-related needs will improve Outcome: Progressing   Problem: Clinical Measurements: Goal: Will remain free from infection Outcome: Progressing Goal: Diagnostic test results will improve Outcome: Progressing Goal: Respiratory complications will improve Outcome: Progressing Goal: Cardiovascular complication will be avoided Outcome: Progressing   Problem: Activity: Goal: Risk for activity intolerance will decrease Outcome: Progressing   Problem: Nutrition: Goal: Adequate nutrition will be maintained Outcome: Progressing   Problem: Coping: Goal: Level of anxiety will decrease Outcome: Progressing   Problem: Elimination: Goal: Will not experience complications related to bowel motility Outcome: Progressing Goal: Will not experience complications related to urinary retention Outcome: Progressing   Problem: Pain Managment: Goal: General experience of comfort will improve Outcome: Progressing   Problem: Safety: Goal: Ability to remain free from injury will improve Outcome: Progressing   Problem: Skin Integrity: Goal: Risk for impaired skin integrity will decrease Outcome: Progressing   

## 2022-12-27 NOTE — Plan of Care (Signed)
  Problem: Education: Goal: Knowledge of General Education information will improve Description: Including pain rating scale, medication(s)/side effects and non-pharmacologic comfort measures Outcome: Adequate for Discharge   

## 2022-12-27 NOTE — Progress Notes (Signed)
Report given to [place].

## 2022-12-27 NOTE — Discharge Summary (Signed)
Physician Discharge Summary  Jason Jones WUJ:[id number] DOB: 1950/07/07 DOA: 12/21/2022  PCP: [name], NP  Admit date: 12/21/2022 Discharge date: 12/27/2022  Time spent: 55 minutes  Recommendations for Outpatient Follow-up:  Follow-up with MD at SNF.  Patient will need basic metabolic profile and CBC done in 1 week to follow-up on electrolytes, renal function and counts. Follow-up with [name], NP in 2 weeks.   Discharge Diagnoses:  Principal Problem:   Acute renal failure superimposed on stage 3a chronic kidney disease (HCC) Active Problems:   Normocytic anemia   Primary hypertension   History of CVA (cerebrovascular accident)   Urinary retention   Acute metabolic encephalopathy   Acute cystitis with hematuria   Bacteriuria   Discharge Condition: Stable and improved.  Diet recommendation: Heart healthy/dysphagia 1 diet.  Filed Weights   12/22/22 0300 12/24/22 0500  Weight: 55.6 kg 55.2 kg    History of present illness:  HPI per Dr. [name] is a 73 y.o. male with medical history significant for prostate cancer, BPH, chronic Foley catheter, and history of CVA who presents from his nursing facility for evaluation of altered mental status and gross hematuria.   Patient reportedly had his Foley catheter changed at his facility yesterday, was draining only frank blood from the catheter since then, and was then noted to be less conversant, more confused, and not eating much today.   ED Course: Upon arrival to the ED, patient is found to be afebrile and saturating well on room air with normal heart rate and stable blood pressure.  Labs are most notable for BUN 60 and creatinine 3.97.  Ultrasound revealed significant urinary retention.  Foley catheter was exchanged in the ED and is now draining significant amount of non-bloody urine.   Patient was given a liter of LR.  Hospital Course:  #1 acute kidney injury on CKD stage IIIa/polymicrobial UTI versus  bacteriuria/elevated anion gap metabolic acidosis -Patient noted on admission to have a creatinine up to 3.97 up from 1.082 weeks prior to admission. -Acute kidney injury felt secondary to postrenal azotemia secondary to malpositioning/obstructing Foley catheter. -Patient on presentation had presented with hematuria. -Foley catheter changed in the ED and patient initially draining purulent but nonbloody urine. -Urinalysis concerning for UTI, urine cultures polymicrobial with greater than 100,000 colonies of MRSA, Citrobacter freundii, Enterobacter intermedius. -Patient initially placed on Levaquin in the ED and received 2 doses of Levaquin, also received a dose of gentamicin. -Patient with good urine output, renal function improved with IV fluids, such that by day of discharge creatinine was down to 1.14 from 3.97 on admission.  -ID consulted for antibiotic recommendations and duration, patient seen in consultation by ID, Dr. [name] who recommended no further antibiotics needed at this time as patient's urine improved with exchange of Foley catheter and cultures per ID felt likely secondary to colonization in patient with chronic Foley catheter. -Patient remained afebrile, no leukocytosis and remained in stable and improved condition after Foley catheter exchange.   2.  History of CVA -Patient maintained on aspirin for secondary stroke prophylaxis as well as statin.   -Outpatient follow-up.     3.  Dementia -Delirium precautions. -Patient maintained on home regimen olanzapine.    4.  Hypertension -Patient maintained on home regimen atenolol during the hospitalization.    5.  Acute metabolic encephalopathy -Likely secondary to acute urinary retention and azotemia. -Foley catheter changed on presentation, renal function improved, mental status improved, and was likely at baseline by day of  discharge.   6.  Normocytic anemia  -Hemoglobin remained stable during the hospitalization and was 7.9  by day of discharge.  -Patient with no overt bleeding. -Noted to have had a recent positive FOBT and CT angiogram GI bleed which showed no definite evidence of acute gastrointestinal hemorrhage. -Patient maintained on oral iron supplementation. -Outpatient follow-up.   7.  Underweight -BMI 16.97 kg/m. -Dietitian consulted. -Patient maintained on nutritional supplementation.   -Outpatient follow-up.     8.  Pressure injury, POA Pressure Injury 12/22/22 Ankle Anterior;Left Stage 2 -  Partial thickness loss of dermis presenting as a shallow open injury with a red, pink wound bed without slough. healing (Active)  12/22/22 0448  Location: Ankle  Location Orientation: Anterior;Left  Staging: Stage 2 -  Partial thickness loss of dermis presenting as a shallow open injury with a red, pink wound bed without slough.  Wound Description (Comments): healing  Present on Admission: Yes         Procedures: None  Consultations: ID: Dr. [name] 12/25/2022  Discharge Exam: Vitals:   12/27/22 0700 12/27/22 0911  BP: 125/76 116/75  Pulse: 82 90  Resp: 18 18  Temp: 98.1 F (36.7 C) 98 F (36.7 C)  SpO2:  92%    General: NAD Cardiovascular: RRR no murmurs rubs or gallops.  No JVD.  No lower extremity edema. Respiratory: CTAB anterior lung fields.  Discharge Instructions   Discharge Instructions     Diet - low sodium heart healthy   Complete by: As directed    Dysphagia 1 diet with thin liquids   Discharge wound care:   Complete by: As directed    Pressure Injury 12/22/22 Ankle Anterior;Left Stage 2 -  Partial thickness loss of dermis presenting as a shallow open injury with a red, pink wound bed without slough. healing   Increase activity slowly   Complete by: As directed       Allergies as of 12/27/2022       Reactions   Ceftriaxone Swelling, Other (See Comments)   Possible lips swelling (dose 07/21/22) "Allergic," per facility   Doxycycline Other (See Comments)    "ALLERGIC," per facility document        Medication List     TAKE these medications    acetaminophen 650 MG CR tablet Commonly known as: TYLENOL Take 650 mg by mouth as needed for pain or fever.   acetaminophen 500 MG tablet Commonly known as: TYLENOL Take 2 tablets (1,000 mg total) by mouth every 8 (eight) hours as needed for mild pain, headache or moderate pain.   albuterol (2.5 MG/3ML) 0.083% nebulizer solution Commonly known as: PROVENTIL Take 3 mLs (2.5 mg total) by nebulization every 4 (four) hours as needed for wheezing or shortness of breath. What changed: when to take this   AQUAPHOR OINTMENT BODY EX Apply 1 application  topically See admin instructions. Apply to the lower legs and feet every shift   aspirin 81 MG chewable tablet Chew 1 tablet (81 mg total) by mouth daily. What changed: when to take this   atenolol 25 MG tablet Commonly known as: TENORMIN Take 12.5 mg by mouth in the morning.   b complex vitamins capsule Take 1 capsule by mouth in the morning.   baclofen 10 MG tablet Commonly known as: LIORESAL Take 0.5 tablets (5 mg total) by mouth 2 (two) times daily. Take 7.5 mg by mouth in the morning and at 5 PM What changed: how much to take   clemastine 2.68  MG Tabs tablet Commonly known as: TAVIST Take 2.68 mg by mouth 2 (two) times daily. 1 tablet twice daily for seasonal allergies rhinitis   doxazosin 1 MG tablet Commonly known as: CARDURA Take 1 mg by mouth in the morning.   feeding supplement Liqd Take 237 mLs by mouth 2 (two) times daily between meals.   ferrous gluconate 324 MG tablet Commonly known as: FERGON Take 1 tablet (324 mg total) by mouth daily with breakfast.   guaiFENesin 100 MG/5ML liquid Commonly known as: ROBITUSSIN Take 15 mLs by mouth every 4 (four) hours as needed for cough or to loosen phlegm. 15 ml q 4h as needed for cough   loperamide 2 MG tablet Commonly known as: IMODIUM A-D Take 2 mg by mouth 4 (four) times  daily as needed for diarrhea or loose stools. Use as needed for loose stool   NON FORMULARY Take 120 mLs by mouth See admin instructions. MedPass 2.0 - Drink 120 ml's by mouth three times a day   OLANZapine zydis 5 MG disintegrating tablet Commonly known as: ZYPREXA Take 1 tablet (5 mg total) by mouth at bedtime.   OLANZapine 2.5 MG tablet Commonly known as: ZyPREXA Take 1 tablet (2.5 mg total) by mouth every morning.   polyethylene glycol 17 g packet Commonly known as: MIRALAX / GLYCOLAX Take 17 g by mouth daily as needed. What changed: reasons to take this   promethazine 25 MG tablet Commonly known as: PHENERGAN Take 25 mg by mouth every 4 (four) hours as needed for nausea or vomiting. As Needed   rosuvastatin 20 MG tablet Commonly known as: CRESTOR Take 1 tablet (20 mg total) by mouth daily. What changed: additional instructions   senna-docusate 8.6-50 MG tablet Commonly known as: Senokot-S Take 1 tablet by mouth at bedtime.   sodium chloride 0.65 % Soln nasal spray Commonly known as: OCEAN Place 1 spray into both nostrils as needed for congestion.               Discharge Care Instructions  (From admission, onward)           Start     Ordered   12/26/22 0000  Discharge wound care:       Comments: Pressure Injury 12/22/22 Ankle Anterior;Left Stage 2 -  Partial thickness loss of dermis presenting as a shallow open injury with a red, pink wound bed without slough. healing   12/26/22 1444           Allergies  Allergen Reactions   Ceftriaxone Swelling and Other (See Comments)    Possible lips swelling (dose 07/21/22) "Allergic," per facility   Doxycycline Other (See Comments)    "ALLERGIC," per facility document    Follow-up Information     Connect with your PCP/Specialist as discussed. Schedule an appointment as soon as possible for a visit .   Contact information: [email or web address] Call our physician referral line at  [phone].        MD at SNF Follow up.          [name], NP. Schedule an appointment as soon as possible for a visit in 2 week(s).   Specialty: Nurse Practitioner Contact information: [address] [place] [address] [phone]                  The results of significant diagnostics from this hospitalization (including imaging, microbiology, ancillary and laboratory) are listed below for reference.    Significant Diagnostic Studies: CT ANGIO GI BLEED  Result Date: 12/06/2022 CLINICAL DATA:  Rectal bleeding. EXAM: CTA ABDOMEN AND PELVIS WITHOUT AND WITH CONTRAST TECHNIQUE: Multidetector CT imaging of the abdomen and pelvis was performed using the standard protocol during bolus administration of intravenous contrast. Multiplanar reconstructed images and MIPs were obtained and reviewed to evaluate the vascular anatomy. RADIATION DOSE REDUCTION: This exam was performed according to the departmental dose-optimization program which includes automated exposure control, adjustment of the mA and/or kV according to patient size and/or use of iterative reconstruction technique. CONTRAST:  OMNIPAQUE IOHEXOL 350 MG/ML SOLN COMPARISON:  October 02, 2022. FINDINGS: VASCULAR Aorta: Atherosclerosis of abdominal aorta is noted without aneurysm or dissection. Celiac: Patent without evidence of aneurysm, dissection, vasculitis or significant stenosis. SMA: Patent without evidence of aneurysm, dissection, vasculitis or significant stenosis. Renals: Both renal arteries are patent without evidence of aneurysm, dissection, vasculitis, fibromuscular dysplasia or significant stenosis. IMA: Patent without evidence of aneurysm, dissection, vasculitis or significant stenosis. Inflow: Patent without evidence of aneurysm, dissection, vasculitis or significant stenosis. Proximal Outflow: Moderate focal stenosis is noted involving the proximal right superficial femoral artery. There is again noted  severe stenosis involving the proximal left superficial femoral artery. Veins: No obvious venous abnormality within the limitations of this arterial phase study. Review of the MIP images confirms the above findings. NON-VASCULAR Lower chest: No acute abnormality. Hepatobiliary: No focal liver abnormality is seen. No gallstones, gallbladder wall thickening, or biliary dilatation. Pancreas: Unremarkable. No pancreatic ductal dilatation or surrounding inflammatory changes. Spleen: Normal in size without focal abnormality. Adrenals/Urinary Tract: Adrenal glands and kidneys appear normal. No hydronephrosis or renal obstruction is noted. There remains severe diffuse wall thickening of the urinary bladder which is decompressed secondary to Foley catheter. Stomach/Bowel: Stomach is within normal limits. Appendix appears normal. No evidence of bowel wall thickening, distention, or inflammatory changes. There is no evidence of contrast extravasation within the bowel lumen to suggest gastrointestinal hemorrhage. Lymphatic: No adenopathy is noted. Reproductive: Stable prostatic enlargement. Other: No abdominal wall hernia or abnormality. No abdominopelvic ascites. Musculoskeletal: No acute or significant osseous findings. IMPRESSION: VASCULAR No definite evidence active gastrointestinal hemorrhage. Moderate focal stenosis seen involving the proximal right superficial femoral artery. Severe focal stenosis seen involving proximal left superficial femoral artery. Aortic Atherosclerosis (ICD10-I70.0). NON-VASCULAR Continued severe diffuse wall thickening of urinary bladder is noted which may be due to history of chronic bladder outlet obstruction, although cystitis cannot be excluded. Urinary bladder is decompressed secondary to Foley catheter. Stable prostatic enlargement. Electronically Signed   By: Lupita Raider M.D.   On: 12/06/2022 09:44    Microbiology: Recent Results (from the past 240 hour(s))  Urine Culture (for  pregnant, neutropenic or urologic patients or patients with an indwelling urinary catheter)     Status: Abnormal   Collection Time: 12/22/22  8:48 AM   Specimen: Urine, Catheterized  Result Value Ref Range Status   Specimen Description URINE, CATHETERIZED  Final   Special Requests   Final    NONE Performed at [facility], [address], [place], [place] [address]    Culture (A)  Final    >=100,000 COLONIES/mL METHICILLIN RESISTANT STAPHYLOCOCCUS AUREUS >=100,000 COLONIES/mL CITROBACTER FREUNDII 50,000 COLONIES/mL ENTEROBACTER INTERMEDIUS    Report Status 12/24/2022 FINAL  Final   Organism ID, Bacteria METHICILLIN RESISTANT STAPHYLOCOCCUS AUREUS (A)  Final   Organism ID, Bacteria CITROBACTER FREUNDII (A)  Final   Organism ID, Bacteria ENTEROBACTER INTERMEDIUS (A)  Final      Susceptibility   Citrobacter freundii - MIC*  CEFEPIME <=0.12 SENSITIVE Sensitive     CEFTRIAXONE <=0.25 SENSITIVE Sensitive     CIPROFLOXACIN 2 RESISTANT Resistant     GENTAMICIN <=1 SENSITIVE Sensitive     IMIPENEM 0.5 SENSITIVE Sensitive     NITROFURANTOIN <=16 SENSITIVE Sensitive     TRIMETH/SULFA >=320 RESISTANT Resistant     PIP/TAZO 8 SENSITIVE Sensitive     * >=100,000 COLONIES/mL CITROBACTER FREUNDII   Enterobacter intermedius - MIC*    AMPICILLIN >=32 RESISTANT Resistant     CEFEPIME <=0.12 SENSITIVE Sensitive     CEFTRIAXONE <=0.25 SENSITIVE Sensitive     CIPROFLOXACIN 2 RESISTANT Resistant     GENTAMICIN <=1 SENSITIVE Sensitive     IMIPENEM <=0.25 SENSITIVE Sensitive     NITROFURANTOIN 256 RESISTANT Resistant     TRIMETH/SULFA >=320 RESISTANT Resistant     AMPICILLIN/SULBACTAM 16 INTERMEDIATE Intermediate     PIP/TAZO <=4 SENSITIVE Sensitive     * 50,000 COLONIES/mL ENTEROBACTER INTERMEDIUS   Methicillin resistant staphylococcus aureus - MIC*    CIPROFLOXACIN >=8 RESISTANT Resistant     GENTAMICIN <=0.5 SENSITIVE Sensitive     NITROFURANTOIN <=16 SENSITIVE Sensitive     OXACILLIN  >=4 RESISTANT Resistant     TETRACYCLINE <=1 SENSITIVE Sensitive     VANCOMYCIN <=0.5 SENSITIVE Sensitive     TRIMETH/SULFA <=10 SENSITIVE Sensitive     CLINDAMYCIN >=8 RESISTANT Resistant     RIFAMPIN <=0.5 SENSITIVE Sensitive     Inducible Clindamycin NEGATIVE Sensitive     LINEZOLID 2 SENSITIVE Sensitive     * >=100,000 COLONIES/mL METHICILLIN RESISTANT STAPHYLOCOCCUS AUREUS  MRSA Next Gen by PCR, Nasal     Status: Abnormal   Collection Time: 12/23/22 11:14 AM   Specimen: Nasal Mucosa; Nasal Swab  Result Value Ref Range Status   MRSA by PCR Next Gen DETECTED (A) NOT DETECTED Final    Comment: RESULT CALLED TO, READ BACK BY AND VERIFIED WITH: RN KELLY DUFFY ON 12/23/22 @ 1609 BY DRT (NOTE) The GeneXpert MRSA Assay (FDA approved for NASAL specimens only), is one component of a comprehensive MRSA colonization surveillance program. It is not intended to diagnose MRSA infection nor to guide or monitor treatment for MRSA infections. Test performance is not FDA approved in patients less than 75 years old. Performed at [facility], [address], [place], [place] [address]      Labs: Basic Metabolic Panel: Recent Labs  Lab 12/22/22 0221 12/23/22 0124 12/24/22 0109 12/25/22 0311 12/26/22 0037  NA 139 143 139 142 141  K 3.9 4.1 3.8 3.9 4.1  CL 108 116* 107 105 103  CO2 21* 20* 23 24 27   GLUCOSE 115* 102* 130* 111* 113*  BUN 52* 37* 33* 27* 30*  CREATININE 3.00* 1.68* 1.16 1.16 1.14  CALCIUM 8.6* 8.4* 8.3* 8.8* 8.9  MG  --  2.1 1.9 1.8  --   PHOS  --  2.9 3.3 3.7  --    Liver Function Tests: Recent Labs  Lab 12/23/22 0124 12/24/22 0109 12/25/22 0311  AST 25 27 25   ALT 23 28 26   ALKPHOS 34* 38 37*  BILITOT 0.3 0.2* 0.4  PROT 6.7 6.5 6.5  ALBUMIN 2.7* 2.6* 2.7*   Recent Labs  Lab 12/21/22 2021  LIPASE 27   No results for input(s): "AMMONIA" in the last 168 hours. CBC: Recent Labs  Lab 12/21/22 2021 12/22/22 0221 12/23/22 0124 12/24/22 0109  12/25/22 0311 12/26/22 0037  WBC 7.1 7.9 5.8 6.2 6.8 7.5  NEUTROABS 4.6  --  3.0 3.2 3.8  --   HGB 9.3* 8.6* 7.8* 7.6* 8.0* 7.9*  HCT 29.2* 27.0* 25.6* 24.1* 25.1* 25.5*  MCV 93.6 92.5 95.5 94.1 93.7 94.1  PLT 266 249 248 238 255 291   Cardiac Enzymes: No results for input(s): "CKTOTAL", "CKMB", "CKMBINDEX", "TROPONINI" in the last 168 hours. BNP: BNP (last 3 results) No results for input(s): "BNP" in the last 8760 hours.  ProBNP (last 3 results) No results for input(s): "PROBNP" in the last 8760 hours.  CBG: No results for input(s): "GLUCAP" in the last 168 hours.     Signed:  Ramiro Harvest MD.  [facility] 12/27/2022, 1:08 PM

## 2022-12-27 NOTE — Progress Notes (Signed)
Patient was to discharge yesterday, however did not go to SNF.  Patient currently stable, no significant change and stable for discharge to SNF.  No charge

## 2022-12-27 NOTE — TOC Progression Note (Signed)
Transition of Care [facility]) - Initial/Assessment Note    Patient Details  Name: Jason Jones MRN: [id number] Date of Birth: 29-Oct-1950  Transition of Care [facility]) CM/SW Contact:    Ralene Bathe, LCSW Phone Number: 12/27/2022, 10:49 AM  Clinical Narrative:                 LCSW contacted admissions at [facility] and provided update.  TOC following.    Patient Goals and CMS Choice            Expected Discharge Plan and Services         Expected Discharge Date: 12/27/22                                    Prior Living Arrangements/Services                       Activities of Daily Living      Permission Sought/Granted                  Emotional Assessment              Admission diagnosis:  Dehydration [E86.0] AKI (acute kidney injury) (HCC) [N17.9] Acute renal failure superimposed on stage 3a chronic kidney disease (HCC) [N17.9, N18.31] Patient Active Problem List   Diagnosis Date Noted   Bacteriuria 12/26/2022   Acute cystitis with hematuria 12/25/2022   Acute renal failure superimposed on stage 3a chronic kidney disease (HCC) 12/21/2022   Acute metabolic encephalopathy 12/21/2022   Urinary retention 07/31/2022   Hydronephrosis, bilateral 07/31/2022   Urinary tract infection associated with indwelling urethral catheter (HCC) 07/31/2022   Leukocytosis 07/30/2022   Pressure injury of skin 07/20/2022   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   Chronic hepatitis C (HCC) 07/20/2022   Protein-calorie malnutrition, severe (HCC) 07/20/2022   Rectal bleeding 07/20/2022   Normocytic anemia 06/28/2022   Hematuria 05/01/2022   History of CVA (cerebrovascular accident) 04/17/2022   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   Malnutrition of moderate degree 04/15/2022   Primary hypertension 03/25/2022   Dysphagia due to recent cerebrovascular accident (CVA) 03/25/2022   Elevated liver enzymes 03/25/2022   Rhabdomyolysis 03/25/2022    Prostate cancer (HCC) 02/20/2021   PCP:  [name], NP Pharmacy:   [facility] - [place], [place] - [address] [place] [address] Phone: [phone] Fax: [phone]     Social Determinants of Health (SDOH) Social History: SDOH Screenings   Food Insecurity: No Food Insecurity (08/03/2022)  Housing: Low Risk  (08/03/2022)  Transportation Needs: No Transportation Needs (08/03/2022)  Utilities: Not At Risk (08/03/2022)  Tobacco Use: High Risk (12/21/2022)   SDOH Interventions:     Readmission Risk Interventions    08/05/2022   12:17 PM 07/31/2022   12:11 PM  Readmission Risk Prevention Plan  Transportation Screening Complete Complete  PCP or Specialist Appt within 3-5 Days  Complete  HRI or Home Care Consult  Complete  Social Work Consult for Recovery Care Planning/Counseling  Complete  Palliative Care Screening  Not Applicable  Medication Review Oceanographer) Complete Complete  PCP or Specialist appointment within 3-5 days of discharge Complete   HRI or Home Care Consult Complete   SW Recovery Care/Counseling Consult Complete   Palliative Care Screening Not Applicable   Skilled Nursing Facility Complete

## 2022-12-27 NOTE — TOC Transition Note (Signed)
Transition of Care [facility]) - CM/SW Discharge Note   Patient Details  Name: Jason Jones MRN: [id number] Date of Birth: 1951-02-11  Transition [facility]) CM/SW Contact:  Ralene Bathe, LCSW Phone Number: 12/27/2022, 1:09 PM   Clinical Narrative:    Patient will DC to:  [facility] Anticipated DC date: 12/27/2022 Family notified: Yes Transport by: Sharin Mons   Per MD patient ready for DC to SNF. RN to call report prior to discharge [phone] room [phone]). RN patient's family, and facility notified of DC. Discharge Summary sent to facility.  Ambulance transport will be requested for patient.   CSW will sign off for now as social work intervention is no longer needed. Please consult [place] again if new needs arise.    Final next level of care: Skilled Nursing Facility Barriers to Discharge: No Barriers Identified   Patient Goals and CMS Choice      Discharge Placement                Patient chooses bed at: [facility] Patient to be transferred to facility by: [facility] Name of family member notified: Dunker,tonya (Daughter)  [phone] Patient and family notified of of transfer: 12/27/22  Discharge Plan and Services Additional resources added to the After Visit Summary for                                       Social Determinants of Health (SDOH) Interventions SDOH Screenings   Food Insecurity: No Food Insecurity (08/03/2022)  Housing: Low Risk  (08/03/2022)  Transportation Needs: No Transportation Needs (08/03/2022)  Utilities: Not At Risk (08/03/2022)  Tobacco Use: High Risk (12/21/2022)     Readmission Risk Interventions    08/05/2022   12:17 PM 07/31/2022   12:11 PM  Readmission Risk Prevention Plan  Transportation Screening Complete Complete  PCP or Specialist Appt within 3-5 Days  Complete  HRI or Home Care Consult  Complete  Social Work Consult for Recovery Care Planning/Counseling  Complete  Palliative Care Screening  Not Applicable  Medication  Review Oceanographer) Complete Complete  PCP or Specialist appointment within 3-5 days of discharge Complete   HRI or Home Care Consult Complete   SW Recovery Care/Counseling Consult Complete   Palliative Care Screening Not Applicable   Skilled Nursing Facility Complete

## 2023-02-04 ENCOUNTER — Encounter: Payer: Self-pay | Admitting: *Deleted

## 2023-05-22 ENCOUNTER — Other Ambulatory Visit: Payer: Self-pay | Admitting: Gastroenterology

## 2023-05-23 ENCOUNTER — Encounter (HOSPITAL_COMMUNITY): Payer: Self-pay | Admitting: Gastroenterology

## 2023-05-23 NOTE — Progress Notes (Signed)
Preop instructions for: Jason Jones    Date of Birth:  1951-02-08                Date of Procedure:  05/30/2023 Procedure:     Flexible Sigmoidoscopy Surgeon: Dr. Elnoria Howard Facility contact:   Aubery Lapping [place] Phone:  [phone]           RN contact name/phone#:            and Fax #: [phone]   Transportation contact phone#: Facility transportation  Time to arrive at [facility]: 0800   Report to: Admitting - Go through main entrance of hospital and tell front desk you are having a procedure done, and they will show how to get to admitting dept.   Do not eat solid food past midnight the night before your procedure.(To include any tube feedings-must be discontinued)  May have the following until 0530 am day of procedure  CLEAR LIQUID DIET Water Black Coffee (sugar ok, NO MILK/CREAM OR CREAMERS)  Tea (sugar ok, NO MILK/CREAM OR CREAMERS) regular and decaf                             Plain Jell-O (NO RED)                                           Fruit ices (not with fruit pulp, NO RED)                                     Popsicles (NO RED)                                                                  Juice: apple, WHITE grape, WHITE cranberry Sports drinks like Gatorade (NO RED)   Take these morning medications only with sips of water.(or give through gastrostomy or feeding tube):   Aspirin, Cardura, Zyprexa, Atenolol, NO Baclofen am of procedure  Note: No Insulin or Diabetic meds should be given or taken the morning of the procedure!   Please send day of procedure: current med list and meds last taken that day, confirm nothing by mouth status from what time, Patient Demographic info( to include DNR status, problem list, allergies)   Bring Insurance card and ID, can shower & use deodorant but nothing else on skin. Leave all jewelry and other valuables at place where living (anything with metal- remove) Any questions prior to procedure call pre  surg nurse Lyla Son [phone]  Any questions DAY OF procedure, call ENDO [phone]   Sent from :Lyla Son, RN-[facility]   [phone] Fax:[phone]

## 2023-05-30 ENCOUNTER — Ambulatory Visit (HOSPITAL_COMMUNITY)
Admission: RE | Admit: 2023-05-30 | Discharge: 2023-05-30 | Disposition: A | Discharge status: Skilled Nursing Facility | Payer: 59 | Attending: Gastroenterology | Admitting: Gastroenterology

## 2023-05-30 ENCOUNTER — Encounter (HOSPITAL_COMMUNITY): Payer: Self-pay | Admitting: Gastroenterology

## 2023-05-30 ENCOUNTER — Encounter (HOSPITAL_COMMUNITY)
Admission: RE | Disposition: A | Discharge status: Skilled Nursing Facility | Payer: Self-pay | Source: Home / Self Care | Attending: Gastroenterology

## 2023-05-30 ENCOUNTER — Encounter (HOSPITAL_COMMUNITY): Payer: Self-pay | Admitting: Anesthesiology

## 2023-05-30 DIAGNOSIS — K625 Hemorrhage of anus and rectum: Secondary | ICD-10-CM | POA: Insufficient documentation

## 2023-05-30 DIAGNOSIS — Z539 Procedure and treatment not carried out, unspecified reason: Secondary | ICD-10-CM | POA: Diagnosis not present

## 2023-05-30 SURGERY — CANCELLED PROCEDURE

## 2023-05-30 NOTE — Progress Notes (Signed)
Pt resident of [facility], presented today with caregiver from facility. She did not know answers to any medication questions. Pt states they did not give an enema and that he took medication this morning with a chocolate boost. Procedure cancelled, office will call to reschedule and give orders [facility]

## 2023-05-30 NOTE — Anesthesia Preprocedure Evaluation (Deleted)
Anesthesia Evaluation    Reviewed: Allergy & Precautions, Patient's Chart, lab work & pertinent test results, reviewed documented beta blocker date and time   History of Anesthesia Complications Negative for: history of anesthetic complications  Airway        Dental   Pulmonary asthma , Current Smoker          Cardiovascular hypertension, Pt. on home beta blockers and Pt. on medications      Neuro/Psych CVA (2023)    GI/Hepatic ,GERD  Medicated,,(+) Hepatitis -, CRectal bleeding   Endo/Other  negative endocrine ROS    Renal/GU Renal InsufficiencyRenal disease  negative genitourinary   Musculoskeletal  (+) Arthritis ,    Abdominal   Peds  Hematology negative hematology ROS (+)   Anesthesia Other Findings Day of surgery medications reviewed with patient.  Reproductive/Obstetrics                             Anesthesia Physical Anesthesia Plan  ASA: 2  Anesthesia Plan: MAC   Post-op Pain Management: Minimal or no pain anticipated   Induction:   PONV Risk Score and Plan: 0 and Treatment may vary due to age or medical condition and Propofol infusion  Airway Management Planned: Natural Airway and Simple Face Mask  Additional Equipment: None  Intra-op Plan:   Post-operative Plan:   Informed Consent:   Plan Discussed with:   Anesthesia Plan Comments:        Anesthesia Quick Evaluation

## 2023-06-23 ENCOUNTER — Other Ambulatory Visit: Payer: Self-pay | Admitting: Gastroenterology

## 2023-07-01 ENCOUNTER — Other Ambulatory Visit: Payer: Self-pay

## 2023-07-01 ENCOUNTER — Observation Stay (HOSPITAL_COMMUNITY)
Admission: EM | Admit: 2023-07-01 | Discharge: 2023-07-05 | Disposition: A | Discharge status: Home / Self Care | Payer: 59

## 2023-07-01 ENCOUNTER — Encounter (HOSPITAL_COMMUNITY): Payer: Self-pay | Admitting: Emergency Medicine

## 2023-07-01 DIAGNOSIS — R1310 Dysphagia, unspecified: Secondary | ICD-10-CM | POA: Insufficient documentation

## 2023-07-01 DIAGNOSIS — S0689AA Other specified intracranial injury with loss of consciousness status unknown, initial encounter: Secondary | ICD-10-CM | POA: Diagnosis present

## 2023-07-01 DIAGNOSIS — Z79899 Other long term (current) drug therapy: Secondary | ICD-10-CM | POA: Diagnosis not present

## 2023-07-01 DIAGNOSIS — K5521 Angiodysplasia of colon with hemorrhage: Secondary | ICD-10-CM | POA: Insufficient documentation

## 2023-07-01 DIAGNOSIS — K625 Hemorrhage of anus and rectum: Principal | ICD-10-CM

## 2023-07-01 DIAGNOSIS — I69391 Dysphagia following cerebral infarction: Secondary | ICD-10-CM

## 2023-07-01 DIAGNOSIS — N401 Enlarged prostate with lower urinary tract symptoms: Secondary | ICD-10-CM | POA: Diagnosis present

## 2023-07-01 DIAGNOSIS — I739 Peripheral vascular disease, unspecified: Secondary | ICD-10-CM | POA: Diagnosis not present

## 2023-07-01 DIAGNOSIS — I959 Hypotension, unspecified: Secondary | ICD-10-CM | POA: Insufficient documentation

## 2023-07-01 DIAGNOSIS — Z7982 Long term (current) use of aspirin: Secondary | ICD-10-CM | POA: Diagnosis not present

## 2023-07-01 DIAGNOSIS — K921 Melena: Principal | ICD-10-CM | POA: Insufficient documentation

## 2023-07-01 DIAGNOSIS — N400 Enlarged prostate without lower urinary tract symptoms: Secondary | ICD-10-CM | POA: Insufficient documentation

## 2023-07-01 DIAGNOSIS — B182 Chronic viral hepatitis C: Secondary | ICD-10-CM | POA: Insufficient documentation

## 2023-07-01 DIAGNOSIS — N138 Other obstructive and reflux uropathy: Secondary | ICD-10-CM | POA: Insufficient documentation

## 2023-07-01 DIAGNOSIS — Z8546 Personal history of malignant neoplasm of prostate: Secondary | ICD-10-CM | POA: Diagnosis not present

## 2023-07-01 DIAGNOSIS — C61 Malignant neoplasm of prostate: Secondary | ICD-10-CM | POA: Diagnosis present

## 2023-07-01 DIAGNOSIS — F17210 Nicotine dependence, cigarettes, uncomplicated: Secondary | ICD-10-CM | POA: Diagnosis not present

## 2023-07-01 DIAGNOSIS — D62 Acute posthemorrhagic anemia: Secondary | ICD-10-CM | POA: Diagnosis not present

## 2023-07-01 DIAGNOSIS — Z8673 Personal history of transient ischemic attack (TIA), and cerebral infarction without residual deficits: Secondary | ICD-10-CM | POA: Diagnosis not present

## 2023-07-01 DIAGNOSIS — I10 Essential (primary) hypertension: Secondary | ICD-10-CM | POA: Diagnosis present

## 2023-07-01 LAB — CBC
HCT: 31.4 % — ABNORMAL LOW (ref 39.0–52.0)
Hemoglobin: 9.8 g/dL — ABNORMAL LOW (ref 13.0–17.0)
MCH: 29.1 pg (ref 26.0–34.0)
MCHC: 31.2 g/dL (ref 30.0–36.0)
MCV: 93.2 fL (ref 80.0–100.0)
Platelets: 296 10*3/uL (ref 150–400)
RBC: 3.37 MIL/uL — ABNORMAL LOW (ref 4.22–5.81)
RDW: 15.8 % — ABNORMAL HIGH (ref 11.5–15.5)
WBC: 5.7 10*3/uL (ref 4.0–10.5)
nRBC: 0 % (ref 0.0–0.2)

## 2023-07-01 LAB — BASIC METABOLIC PANEL
Anion gap: 11 (ref 5–15)
BUN: 21 mg/dL (ref 8–23)
CO2: 24 mmol/L (ref 22–32)
Calcium: 9.1 mg/dL (ref 8.9–10.3)
Chloride: 106 mmol/L (ref 98–111)
Creatinine, Ser: 0.94 mg/dL (ref 0.61–1.24)
GFR, Estimated: >60 mL/min (ref >60)
Glucose, Bld: 113 mg/dL — ABNORMAL HIGH (ref 70–99)
Potassium: 4.1 mmol/L (ref 3.5–5.1)
Sodium: 141 mmol/L (ref 135–145)

## 2023-07-01 LAB — TYPE AND SCREEN
ABO/RH(D): A POS
Antibody Screen: NEGATIVE

## 2023-07-01 NOTE — ED Provider Notes (Incomplete)
[place] EMERGENCY DEPARTMENT AT [facility] Provider Note   CSN: [id number] Arrival date & time: 07/01/23  2120     History {Add pertinent medical, surgical, social history, OB history to HPI:1} Chief Complaint  Patient presents with  . Rectal Bleeding    Jason Jones is a 73 y.o. male.  This is a 73 year old male presenting emergency department for rectal bleeding.  Reportedly history of the same.  He denies abdominal pain.  Reports an episode earlier today of bloody bright red stool.  EMS reported bleeding x 3.  Not taking any blood thinners.   Rectal Bleeding      Home Medications Prior to Admission medications   Medication Sig Start Date End Date Taking? Authorizing Provider  acetaminophen (TYLENOL) 500 MG tablet Take 2 tablets (1,000 mg total) by mouth every 8 (eight) hours as needed for mild pain, headache or moderate pain. 08/05/22   Alwyn Ren, MD  acetaminophen (TYLENOL) 650 MG CR tablet Take 650 mg by mouth as needed for pain or fever.    [provider]  albuterol (PROVENTIL) (2.5 MG/3ML) 0.083% nebulizer solution Take 3 mLs (2.5 mg total) by nebulization every 4 (four) hours as needed for wheezing or shortness of breath. Patient taking differently: Take 2.5 mg by nebulization in the morning and at bedtime. 03/28/22   Marrianne Mood, MD  aspirin 81 MG chewable tablet Chew 1 tablet (81 mg total) by mouth daily. Patient taking differently: Chew 81 mg by mouth in the morning. 03/29/22   Marrianne Mood, MD  atenolol (TENORMIN) 25 MG tablet Take 12.5 mg by mouth in the morning.    [provider]  b complex vitamins capsule Take 1 capsule by mouth in the morning.    [provider]  baclofen (LIORESAL) 10 MG tablet Take 0.5 tablets (5 mg total) by mouth 2 (two) times daily. Take 7.5 mg by mouth in the morning and at 5 PM 12/26/22   Rodolph Bong, MD  clemastine (TAVIST) 2.68 MG TABS tablet Take 2.68 mg by mouth 2  (two) times daily. 1 tablet twice daily for seasonal allergies rhinitis    [provider]  doxazosin (CARDURA) 1 MG tablet Take 1 mg by mouth in the morning.    [provider]  Emollient (AQUAPHOR OINTMENT BODY EX) Apply 1 application  topically See admin instructions. Apply to the lower legs and feet every shift    [provider]  feeding supplement (ENSURE ENLIVE / ENSURE PLUS) LIQD Take 237 mLs by mouth 2 (two) times daily between meals. 03/28/22   Marrianne Mood, MD  ferrous gluconate (FERGON) 324 MG tablet Take 1 tablet (324 mg total) by mouth daily with breakfast. 12/26/22 06/17/23  Rodolph Bong, MD  guaiFENesin (ROBITUSSIN) 100 MG/5ML liquid Take 15 mLs by mouth every 4 (four) hours as needed for cough or to loosen phlegm. 15 ml q 4h as needed for cough    [provider]  loperamide (IMODIUM A-D) 2 MG tablet Take 2 mg by mouth 4 (four) times daily as needed for diarrhea or loose stools. Use as needed for loose stool    [provider]  NON FORMULARY Take 120 mLs by mouth See admin instructions. MedPass 2.0 - Drink 120 ml's by mouth three times a day    [provider]  OLANZapine (ZYPREXA) 2.5 MG tablet Take 1 tablet (2.5 mg total) by mouth every morning. 08/05/22 08/05/23  Alwyn Ren, MD  OLANZapine zydis (  ZYPREXA) 5 MG disintegrating tablet Take 1 tablet (5 mg total) by mouth at bedtime. 08/05/22   Alwyn Ren, MD  polyethylene glycol (MIRALAX / GLYCOLAX) 17 g packet Take 17 g by mouth daily as needed. Patient taking differently: Take 17 g by mouth daily as needed (for constipation). 04/16/22   Kathlen Mody, MD  promethazine (PHENERGAN) 25 MG tablet Take 25 mg by mouth every 4 (four) hours as needed for nausea or vomiting. As Needed    [provider]  rosuvastatin (CRESTOR) 20 MG tablet Take 1 tablet (20 mg total) by mouth daily. Patient taking differently: Take 20 mg by mouth daily. Take 0.5 mg by mouth  on Monday, Wednesday, and Friday.  Take 1 tablet by mouth every Tuesday, Thursday, Saturday, and Sunday 03/29/22   Marrianne Mood, MD  senna-docusate (SENOKOT-S) 8.6-50 MG tablet Take 1 tablet by mouth at bedtime.    [provider]  sodium chloride (OCEAN) 0.65 % SOLN nasal spray Place 1 spray into both nostrils as needed for congestion. 04/16/22   Kathlen Mody, MD      Allergies    Ceftriaxone and Doxycycline    Review of Systems   Review of Systems  Gastrointestinal:  Positive for hematochezia.    Physical Exam Updated Vital Signs BP 116/73   Pulse 81   Temp 98.4 F (36.9 C) (Oral)   Resp 18   SpO2 96%  Physical Exam  ED Results / Procedures / Treatments   Labs (all labs ordered are listed, but only abnormal results are displayed) Labs Reviewed  CBC - Abnormal; Notable for the following components:      Result Value   RBC 3.37 (*)    Hemoglobin 9.8 (*)    HCT 31.4 (*)    RDW 15.8 (*)    All other components within normal limits  BASIC METABOLIC PANEL - Abnormal; Notable for the following components:   Glucose, Bld 113 (*)    All other components within normal limits  TYPE AND SCREEN    EKG EKG Interpretation Date/Time:  Tuesday July 01 2023 21:38:15 EST Ventricular Rate:  77 PR Interval:  158 QRS Duration:  87 QT Interval:  411 QTC Calculation: 466 R Axis:   56  Text Interpretation: Sinus rhythm Atrial premature complex Confirmed by Estanislado Pandy [phone]) on 07/01/2023 10:47:26 PM  Radiology No results found.  Procedures Procedures  {Document cardiac monitor, telemetry assessment procedure when appropriate:1}  Medications Ordered in ED Medications - No data to display  ED Course/ Medical Decision Making/ A&P   {   Click here for ABCD2, HEART and other calculatorsREFRESH Note before signing :1}                              Medical Decision Making 73 year old male presenting emergency department for GI bleed.  Had bloody bowel  movement earlier.  He is afebrile nontachycardic hemodynamically stable.  Has grossly melanotic stool in his adult diaper.  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  Unable to locate records of prior colonoscopy or GI workup.  Hemoglobin is stable however at 9.8.  No significant metabolic derangements on BMP. Given patient's age and comorbid medical issues will admit for trending of hemoglobin further GI workup.  Amount and/or Complexity of Data Reviewed Independent Historian:     Details: EMS reported stable vitals External Data Reviewed:     Details: Not on blood thinner  per chart review Labs: ordered. Decision-making details documented in ED Course. Radiology:     Details: Considered CT scan, however reassuring vitals and exam.  Patient would benefit more from colonoscopy rather than CTA Discussion of management or test interpretation with external provider(s): Hospitalist, GI  Risk Decision regarding hospitalization.   ***  {Document critical care time when appropriate:1} {Document review of labs and clinical decision tools ie heart score, Chads2Vasc2 etc:1}  {Document your independent review of radiology images, and any outside records:1} {Document your discussion with family members, caretakers, and with consultants:1} {Document social determinants of health affecting pt's care:1} {Document your decision making why or why not admission, treatments were needed:1} Final Clinical Impression(s) / ED Diagnoses Final diagnoses:  None    Rx / DC Orders ED Discharge Orders     None

## 2023-07-01 NOTE — ED Triage Notes (Signed)
Patient BIB EMS from [facility] for evaluation of rectal bleeding.  Hx of anemia and GI bleeding.  Per report, patient had rectal bleeding x 3 today.  Pt has no reports of pain, nausea, or vomiting.

## 2023-07-01 NOTE — ED Provider Notes (Signed)
[place] EMERGENCY DEPARTMENT AT [facility] Provider Note   CSN: [id number] Arrival date & time: 07/01/23  2120     History  Chief Complaint  Patient presents with   Rectal Bleeding    Jason Jones is a 73 y.o. male.  This is a 73 year old male presenting emergency department for rectal bleeding.  Reportedly history of the same.  He denies abdominal pain.  Reports an episode earlier today of bloody bright red stool.  EMS reported bleeding x 3.  Not taking any blood thinners.   Rectal Bleeding      Home Medications Prior to Admission medications   Medication Sig Start Date End Date Taking? Authorizing Provider  acetaminophen (TYLENOL) 500 MG tablet Take 2 tablets (1,000 mg total) by mouth every 8 (eight) hours as needed for mild pain, headache or moderate pain. Patient taking differently: Take 1,000 mg by mouth in the morning and at bedtime. 08/05/22  Yes Alwyn Ren, MD  acetaminophen (TYLENOL) 650 MG CR tablet Take 650 mg by mouth as needed for pain or fever.   Yes [provider]  albuterol (PROVENTIL) (2.5 MG/3ML) 0.083% nebulizer solution Take 3 mLs (2.5 mg total) by nebulization every 4 (four) hours as needed for wheezing or shortness of breath. 03/28/22  Yes Marrianne Mood, MD  Amino Acids-Protein Hydrolys (PRO-STAT 64 PO) Take 30 mLs by mouth in the morning and at bedtime.   Yes [provider]  aspirin 81 MG chewable tablet Chew 1 tablet (81 mg total) by mouth daily. Patient taking differently: Chew 81 mg by mouth in the morning. 03/29/22  Yes Marrianne Mood, MD  atenolol (TENORMIN) 25 MG tablet Take 12.5 mg by mouth every 12 (twelve) hours as needed (BP>130).   Yes [provider]  b complex vitamins capsule Take 1 capsule by mouth in the morning.   Yes [provider]  baclofen (LIORESAL) 10 MG tablet Take 0.5 tablets (5 mg total) by mouth 2 (two) times daily. Take 7.5 mg by mouth in the morning and at 5  PM Patient taking differently: Take 7.5 mg by mouth 2 (two) times daily. 12/26/22  Yes Rodolph Bong, MD  Baclofen 5 MG TABS Take 7.5 mg by mouth in the morning and at bedtime.   Yes [provider]  doxazosin (CARDURA) 1 MG tablet Take 1 mg by mouth in the morning.   Yes [provider]  Emollient (AQUAPHOR OINTMENT BODY EX) Apply 1 application  topically See admin instructions. Apply to the lower legs and feet every shift   Yes [provider]  ferrous sulfate 325 (65 FE) MG EC tablet Take 325 mg by mouth daily with breakfast.   Yes [provider]  melatonin 5 MG TABS Take 5 mg by mouth at bedtime.   Yes [provider]  NON FORMULARY Take 120 mLs by mouth See admin instructions. MedPass 2.0 - Drink 120 ml's by mouth three times a day   Yes [provider]  omeprazole (PRILOSEC) 20 MG capsule Take 20 mg by mouth at bedtime.   Yes [provider]  polyethylene glycol (MIRALAX / GLYCOLAX) 17 g packet Take 17 g by mouth daily as needed. Patient taking differently: Take 17 g by mouth daily as needed (for constipation). 04/16/22  Yes Kathlen Mody, MD  rosuvastatin (CRESTOR) 20 MG tablet Take 1 tablet (20 mg total) by mouth daily. 03/29/22  Yes Marrianne Mood, MD  senna-docusate (SENOKOT-S) 8.6-50 MG tablet Take 2 tablets  by mouth at bedtime.   Yes [provider]  sodium chloride (OCEAN) 0.65 % SOLN nasal spray Place 1 spray into both nostrils as needed for congestion. 04/16/22  Yes Kathlen Mody, MD  ferrous gluconate (FERGON) 324 MG tablet Take 1 tablet (324 mg total) by mouth daily with breakfast. 12/26/22 06/17/23  Rodolph Bong, MD      Allergies    Ceftriaxone and Doxycycline    Review of Systems   Review of Systems  Gastrointestinal:  Positive for hematochezia.    Physical Exam Updated Vital Signs BP 116/73   Pulse 81   Temp 98.4 F (36.9 C) (Oral)   Resp 18   SpO2 96%  Physical Exam  ED Results /  Procedures / Treatments   Labs (all labs ordered are listed, but only abnormal results are displayed) Labs Reviewed  CBC - Abnormal; Notable for the following components:      Result Value   RBC 3.37 (*)    Hemoglobin 9.8 (*)    HCT 31.4 (*)    RDW 15.8 (*)    All other components within normal limits  BASIC METABOLIC PANEL - Abnormal; Notable for the following components:   Glucose, Bld 113 (*)    All other components within normal limits  TYPE AND SCREEN    EKG EKG Interpretation Date/Time:  Tuesday July 01 2023 21:38:15 EST Ventricular Rate:  77 PR Interval:  158 QRS Duration:  87 QT Interval:  411 QTC Calculation: 466 R Axis:   56  Text Interpretation: Sinus rhythm Atrial premature complex Confirmed by Estanislado Pandy [phone]) on 07/01/2023 10:47:26 PM  Radiology No results found.  Procedures Procedures    Medications Ordered in ED Medications - No data to display  ED Course/ Medical Decision Making/ A&P                                 Medical Decision Making 73 year old male presenting emergency department for GI bleed.  Had bloody bowel movement earlier.  He is afebrile nontachycardic hemodynamically stable.  Has grossly melanotic stool in his adult diaper.  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  Unable to locate records of prior colonoscopy or GI workup.  Hemoglobin is stable however at 9.8.  No significant metabolic derangements on BMP. Given patient's age and comorbid medical issues will admit for trending of hemoglobin further GI workup.   Amount and/or Complexity of Data Reviewed Independent Historian:     Details: EMS reported stable vitals External Data Reviewed:     Details: Not on blood thinner per chart review Labs: ordered. Decision-making details documented in ED Course. Radiology:     Details: Considered CT scan, however reassuring vitals and exam.  Patient would benefit more from colonoscopy rather than  CTA Discussion of management or test interpretation with external provider(s): Hospitalist, GI  Risk Decision regarding hospitalization.         Final Clinical Impression(s) / ED Diagnoses Final diagnoses:  Rectal bleeding    Rx / DC Orders ED Discharge Orders     None         Coral Spikes, DO 07/02/23 0008

## 2023-07-02 DIAGNOSIS — Z8673 Personal history of transient ischemic attack (TIA), and cerebral infarction without residual deficits: Secondary | ICD-10-CM | POA: Diagnosis not present

## 2023-07-02 DIAGNOSIS — I959 Hypotension, unspecified: Secondary | ICD-10-CM | POA: Diagnosis not present

## 2023-07-02 DIAGNOSIS — Z8546 Personal history of malignant neoplasm of prostate: Secondary | ICD-10-CM | POA: Diagnosis not present

## 2023-07-02 DIAGNOSIS — B182 Chronic viral hepatitis C: Secondary | ICD-10-CM | POA: Diagnosis not present

## 2023-07-02 DIAGNOSIS — F17210 Nicotine dependence, cigarettes, uncomplicated: Secondary | ICD-10-CM | POA: Diagnosis not present

## 2023-07-02 DIAGNOSIS — S0689AA Other specified intracranial injury with loss of consciousness status unknown, initial encounter: Secondary | ICD-10-CM | POA: Diagnosis present

## 2023-07-02 DIAGNOSIS — N400 Enlarged prostate without lower urinary tract symptoms: Secondary | ICD-10-CM | POA: Diagnosis not present

## 2023-07-02 DIAGNOSIS — K625 Hemorrhage of anus and rectum: Secondary | ICD-10-CM | POA: Diagnosis present

## 2023-07-02 DIAGNOSIS — K921 Melena: Secondary | ICD-10-CM | POA: Diagnosis not present

## 2023-07-02 DIAGNOSIS — R1310 Dysphagia, unspecified: Secondary | ICD-10-CM | POA: Diagnosis not present

## 2023-07-02 DIAGNOSIS — Z7982 Long term (current) use of aspirin: Secondary | ICD-10-CM | POA: Diagnosis not present

## 2023-07-02 DIAGNOSIS — D62 Acute posthemorrhagic anemia: Secondary | ICD-10-CM | POA: Diagnosis not present

## 2023-07-02 DIAGNOSIS — I739 Peripheral vascular disease, unspecified: Secondary | ICD-10-CM | POA: Diagnosis not present

## 2023-07-02 DIAGNOSIS — Z79899 Other long term (current) drug therapy: Secondary | ICD-10-CM | POA: Diagnosis not present

## 2023-07-02 DIAGNOSIS — N138 Other obstructive and reflux uropathy: Secondary | ICD-10-CM | POA: Diagnosis not present

## 2023-07-02 DIAGNOSIS — K5521 Angiodysplasia of colon with hemorrhage: Secondary | ICD-10-CM | POA: Diagnosis not present

## 2023-07-02 LAB — CBC
HCT: 30.1 % — ABNORMAL LOW (ref 39.0–52.0)
HCT: 31.6 % — ABNORMAL LOW (ref 39.0–52.0)
HCT: 30 % — ABNORMAL LOW (ref 39.0–52.0)
Hemoglobin: 9.3 g/dL — ABNORMAL LOW (ref 13.0–17.0)
Hemoglobin: 10 g/dL — ABNORMAL LOW (ref 13.0–17.0)
Hemoglobin: 9.6 g/dL — ABNORMAL LOW (ref 13.0–17.0)
MCH: 28.4 pg (ref 26.0–34.0)
MCH: 29 pg (ref 26.0–34.0)
MCH: 29 pg (ref 26.0–34.0)
MCHC: 30.9 g/dL (ref 30.0–36.0)
MCHC: 31.6 g/dL (ref 30.0–36.0)
MCHC: 32 g/dL (ref 30.0–36.0)
MCV: 92 fL (ref 80.0–100.0)
MCV: 91.6 fL (ref 80.0–100.0)
MCV: 90.6 fL (ref 80.0–100.0)
Platelets: 308 10*3/uL (ref 150–400)
Platelets: 327 10*3/uL (ref 150–400)
Platelets: 311 10*3/uL (ref 150–400)
RBC: 3.27 MIL/uL — ABNORMAL LOW (ref 4.22–5.81)
RBC: 3.45 MIL/uL — ABNORMAL LOW (ref 4.22–5.81)
RBC: 3.31 MIL/uL — ABNORMAL LOW (ref 4.22–5.81)
RDW: 15.7 % — ABNORMAL HIGH (ref 11.5–15.5)
RDW: 15.6 % — ABNORMAL HIGH (ref 11.5–15.5)
RDW: 15.8 % — ABNORMAL HIGH (ref 11.5–15.5)
WBC: 7 10*3/uL (ref 4.0–10.5)
WBC: 6.6 10*3/uL (ref 4.0–10.5)
WBC: 8.6 10*3/uL (ref 4.0–10.5)
nRBC: 0 % (ref 0.0–0.2)
nRBC: 0 % (ref 0.0–0.2)
nRBC: 0 % (ref 0.0–0.2)

## 2023-07-02 LAB — MRSA NEXT GEN BY PCR, NASAL: MRSA by PCR Next Gen: DETECTED — AB

## 2023-07-02 MED ORDER — ASPIRIN 81 MG PO CHEW: 81.0000 mg | CHEWABLE_TABLET | Freq: Every day | ORAL | Status: DC

## 2023-07-02 MED ORDER — BACLOFEN 10 MG PO TABS
10.0000 mg | ORAL_TABLET | Freq: Two times a day (BID) | ORAL | Status: DC
  Administered 2023-07-02 – 2023-07-04 (×5): 10 mg via ORAL
  Filled 2023-07-02 (×5): qty 1

## 2023-07-02 MED ORDER — ROSUVASTATIN CALCIUM 20 MG PO TABS
20.0000 mg | ORAL_TABLET | Freq: Every day | ORAL | Status: DC
  Administered 2023-07-02 – 2023-07-05 (×4): 20 mg via ORAL
  Filled 2023-07-02 (×4): qty 1

## 2023-07-02 MED ORDER — ONDANSETRON HCL 4 MG/2ML IJ SOLN: 4.0000 mg | Freq: Four times a day (QID) | INTRAMUSCULAR | Status: DC | PRN

## 2023-07-02 MED ORDER — ACETAMINOPHEN 325 MG PO TABS
650.0000 mg | ORAL_TABLET | Freq: Four times a day (QID) | ORAL | Status: DC | PRN
  Administered 2023-07-03: 650 mg via ORAL
  Filled 2023-07-02: qty 2

## 2023-07-02 MED ORDER — ONDANSETRON HCL 4 MG PO TABS: 4.0000 mg | ORAL_TABLET | Freq: Four times a day (QID) | ORAL | Status: DC | PRN

## 2023-07-02 MED ORDER — ATENOLOL 25 MG PO TABS: 12.5000 mg | ORAL_TABLET | Freq: Two times a day (BID) | ORAL | Status: DC | PRN

## 2023-07-02 MED ORDER — DOXAZOSIN MESYLATE 1 MG PO TABS
1.0000 mg | ORAL_TABLET | Freq: Every day | ORAL | Status: DC
  Administered 2023-07-02 – 2023-07-04 (×3): 1 mg via ORAL
  Filled 2023-07-02 (×3): qty 1

## 2023-07-02 MED ORDER — CHLORHEXIDINE GLUCONATE CLOTH 2 % EX PADS
6.0000 | MEDICATED_PAD | Freq: Every day | CUTANEOUS | Status: DC
  Administered 2023-07-02 – 2023-07-05 (×4): 6 via TOPICAL

## 2023-07-02 MED ORDER — ACETAMINOPHEN 650 MG RE SUPP: 650.0000 mg | Freq: Four times a day (QID) | RECTAL | Status: DC | PRN

## 2023-07-02 NOTE — ED Notes (Signed)
Patient transported to [facility] at this time via [facility] °

## 2023-07-02 NOTE — NC FL2 (Addendum)
[place] MEDICAID FL2 LEVEL OF CARE FORM     IDENTIFICATION  Patient Name: Armond Mussett Birthdate: Jul 16, 1950 Sex: male Admission Date (Current Location): 07/01/2023  [facility] and [place] Number:  Chiropodist and Address:  [facility], [address] [place], [place] [address]      Provider Number: [id number]  Attending Physician Name and Address:  Rolly Salter, MD  Relative Name and Phone Number:       Current Level of Care: SNF Recommended Level of Care: Skilled Nursing Facility Prior Approval Number:    Date Approved/Denied:   PASRR Number: [id number]  Discharge Plan: SNF    Current Diagnoses: Patient Active Problem List   Diagnosis Date Noted   Melena 07/01/2023   Bacteriuria 12/26/2022   Acute cystitis with hematuria 12/25/2022   Acute renal failure superimposed on stage 3a chronic kidney disease (HCC) 12/21/2022   Acute metabolic encephalopathy 12/21/2022   Urinary retention 07/31/2022   Hydronephrosis, bilateral 07/31/2022   Urinary tract infection associated with indwelling urethral catheter (HCC) 07/31/2022   Leukocytosis 07/30/2022   Pressure injury of skin 07/20/2022   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   Chronic hepatitis C (HCC) 07/20/2022   Protein-calorie malnutrition, severe (HCC) 07/20/2022   Rectal bleeding 07/20/2022   Normocytic anemia 06/28/2022   Hematuria 05/01/2022   History of CVA (cerebrovascular accident) 04/17/2022   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   Malnutrition of moderate degree 04/15/2022   Primary hypertension 03/25/2022   Dysphagia due to recent cerebrovascular accident (CVA) 03/25/2022   Elevated liver enzymes 03/25/2022   Rhabdomyolysis 03/25/2022   Prostate cancer (HCC) 02/20/2021    Orientation RESPIRATION BLADDER Height & Weight     Self, Time, Situation, Place  Normal Continent Weight:   Height:     BEHAVIORAL SYMPTOMS/MOOD NEUROLOGICAL BOWEL NUTRITION STATUS       Continent Diet (see DC summary)  AMBULATORY STATUS COMMUNICATION OF NEEDS Skin   Extensive Assist Verbally Normal                       Personal Care Assistance Level of Assistance  Bathing, Dressing, Feeding Bathing Assistance: Maximum assistance Feeding assistance: Limited assistance Dressing Assistance: Maximum assistance     Functional Limitations Info             SPECIAL CARE FACTORS FREQUENCY                       Contractures      Additional Factors Info  Code Status, Allergies Code Status Info: Full Allergies Info: Ceftriaxone, Doxycycline           Current Medications (07/02/2023):  This is the current hospital active medication list Current Facility-Administered Medications  Medication Dose Route Frequency Provider Last Rate Last Admin   acetaminophen (TYLENOL) tablet 650 mg  650 mg Oral Q6H PRN Dorrell, Robert, MD       Or   acetaminophen (TYLENOL) suppository 650 mg  650 mg Rectal Q6H PRN Dorrell, Robert, MD       atenolol (TENORMIN) tablet 12.5 mg  12.5 mg Oral Q12H PRN Dorrell, Robert, MD       baclofen (LIORESAL) tablet 10 mg  10 mg Oral BID Alan Mulder, MD   10 mg at 07/02/23 1127   Chlorhexidine Gluconate Cloth 2 % PADS 6 each  6 each Topical Daily Rolly Salter, MD   6 each at 07/02/23 1128   doxazosin (  CARDURA) tablet 1 mg  1 mg Oral Daily Dorrell, Robert, MD   1 mg at 07/02/23 1128   ondansetron (ZOFRAN) tablet 4 mg  4 mg Oral Q6H PRN Alan Mulder, MD       Or   ondansetron (ZOFRAN) injection 4 mg  4 mg Intravenous Q6H PRN Dorrell, Robert, MD       rosuvastatin (CRESTOR) tablet 20 mg  20 mg Oral Daily Alan Mulder, MD   20 mg at 07/02/23 1128     Discharge Medications: Please see discharge summary for a list of discharge medications.  Relevant Imaging Results:  Relevant Lab Results:   Additional Information SS# [id number]  Antion Felipa [place], Student-Social Work

## 2023-07-02 NOTE — H&P (Signed)
History and Physical    Jason Jones OJJ:[id number] DOB: [birth year] DOA: 07/01/2023  PCP: [name], NP   Chief Complaint: hematochezia  HPI: Jason Jones is a 74 y.o. male with medical history significant of prior stroke, hypertension who presents emergency department due to 2 bloody stools.  Patient noted bright blood per rectum earlier today.  He called EMS and was transported to the ER.  He subsequently had 3 bloody stools.  On arrival to the ER he was afebrile and hemodynamically stable.  Labs were obtained which demonstrated hemoglobin 9.8 baseline between 7 and 9, WBC 5.7, BMP unrevealing.  Patient subsequently had repeat maroon-colored stools in emergency department.  GI was consulted who recommended admission for further management.  On evaluation he was resting comfortably.  He did not have any symptoms of dizziness or abdominal pain.  He presented in April 2024 with similar complaints.  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  He was observed and eventually discharged.   Review of Systems: Review of Systems  Constitutional:  Negative for chills and fever.  HENT: Negative.    Eyes: Negative.   Respiratory: Negative.    Cardiovascular: Negative.   Gastrointestinal: Negative.   Genitourinary: Negative.   Musculoskeletal: Negative.   Skin: Negative.   Neurological: Negative.   Endo/Heme/Allergies: Negative.   Psychiatric/Behavioral: Negative.    All other systems reviewed and are negative.    As per HPI otherwise 10 point review of systems negative.   Allergies  Allergen Reactions   Ceftriaxone Swelling and Other (See Comments)    Possible lips swelling (dose 07/21/22) "Allergic," per facility   Doxycycline Other (See Comments)    "ALLERGIC," per facility document    Past Medical History:  Diagnosis Date   Acute ischemic right PCA stroke (HCC) 03/25/2022   Arthritis     Asthma    Cancer (HCC)    CAP (community acquired pneumonia) 07/20/2022   Chronic alcohol abuse    Dyspnea    GERD (gastroesophageal reflux disease)    PRN  ---  TAKES BAKING SODA IN WATER   History of pneumothorax    04-28-2006  fell, left fx rib--  resolved w/ chest tube   Hypertension    Nocturia    Poor dental hygiene    Right inguinal hernia    Weak urinary stream     Past Surgical History:  Procedure Laterality Date   COLONOSCOPY  01/08/2021   GOLD SEED IMPLANT N/A 05/23/2021   Procedure: GOLD SEED IMPLANT;  Surgeon: Rene Paci, MD;  Location: [facility];  Service: Urology;  Laterality: N/A;   INGUINAL HERNIA REPAIR Right 04/23/2017   Procedure: OPEN RIGHT INGUINAL HERNIA REPAIR WITH MESH;  Surgeon: Kinsinger, De Blanch, MD;  Location: [facility];  Service: General;  Laterality: Right;  GENERAL COMBINED WITH REGIONAL FOR POST OP PAIN    INSERTION OF MESH Right 04/23/2017   Procedure: INSERTION OF MESH;  Surgeon: Kinsinger, De Blanch, MD;  Location: [facility] [place];  Service: General;  Laterality: Right;  GENERAL COMBINED WITH REGIONAL FOR POST OP PAIN    LIPOMA EXCISION Right 02/22/2015   Procedure: RIGHT SHOULDER MASS EXCISION 15 CM SQ;  Surgeon: Rodman Pickle, MD;  Location: [facility];  Service: General;  Laterality: Right;   SPACE OAR INSTILLATION N/A 05/23/2021   Procedure: SPACE OAR INSTILLATION;  Surgeon: Rene Paci, MD;  Location: [facility];  Service: Urology;  Laterality: N/A;   TRANSRECTAL ULTRASOUND N/A 05/23/2021   Procedure: TRANSRECTAL ULTRASOUND;  Surgeon: Rene Paci, MD;  Location: [facility];  Service: Urology;  Laterality: N/A;     reports that he has been smoking cigarettes. He has a 25 pack-year smoking history. He has never used smokeless tobacco. He reports that he does not currently use alcohol. He reports that he does not currently use drugs after having used the  following drugs: Marijuana.  Family History  Problem Relation Age of Onset   Prostate cancer Cousin    Prostate cancer Cousin     Prior to Admission medications   Medication Sig Start Date End Date Taking? Authorizing Provider  acetaminophen (TYLENOL) 500 MG tablet Take 2 tablets (1,000 mg total) by mouth every 8 (eight) hours as needed for mild pain, headache or moderate pain. Patient taking differently: Take 1,000 mg by mouth in the morning and at bedtime. 08/05/22  Yes Alwyn Ren, MD  acetaminophen (TYLENOL) 650 MG CR tablet Take 650 mg by mouth as needed for pain or fever.   Yes [provider]  albuterol (PROVENTIL) (2.5 MG/3ML) 0.083% nebulizer solution Take 3 mLs (2.5 mg total) by nebulization every 4 (four) hours as needed for wheezing or shortness of breath. 03/28/22  Yes Marrianne Mood, MD  Amino Acids-Protein Hydrolys (PRO-STAT 64 PO) Take 30 mLs by mouth in the morning and at bedtime.   Yes [provider]  aspirin 81 MG chewable tablet Chew 1 tablet (81 mg total) by mouth daily. Patient taking differently: Chew 81 mg by mouth in the morning. 03/29/22  Yes Marrianne Mood, MD  atenolol (TENORMIN) 25 MG tablet Take 12.5 mg by mouth every 12 (twelve) hours as needed (BP>130).   Yes [provider]  b complex vitamins capsule Take 1 capsule by mouth in the morning.   Yes [provider]  baclofen (LIORESAL) 10 MG tablet Take 0.5 tablets (5 mg total) by mouth 2 (two) times daily. Take 7.5 mg by mouth in the morning and at 5 PM Patient taking differently: Take 7.5 mg by mouth 2 (two) times daily. 12/26/22  Yes Rodolph Bong, MD  Baclofen 5 MG TABS Take 7.5 mg by mouth in the morning and at bedtime.   Yes [provider]  doxazosin (CARDURA) 1 MG tablet Take 1 mg by mouth in the morning.   Yes [provider]  Emollient (AQUAPHOR OINTMENT BODY EX) Apply 1 application  topically See admin instructions. Apply to the  lower legs and feet every shift   Yes [provider]  ferrous sulfate 325 (65 FE) MG EC tablet Take 325 mg by mouth daily with breakfast.   Yes [provider]  melatonin 5 MG TABS Take 5 mg by mouth at bedtime.   Yes [provider]  NON FORMULARY Take 120 mLs by mouth See admin instructions. MedPass 2.0 - Drink 120 ml's by mouth three times a day   Yes [provider]  omeprazole (PRILOSEC) 20 MG capsule Take 20 mg by mouth at bedtime.   Yes [provider]  polyethylene glycol (MIRALAX / GLYCOLAX) 17 g packet Take 17 g by mouth daily as needed. Patient taking differently: Take 17 g by mouth daily as needed (for constipation). 04/16/22  Yes Kathlen Mody, MD  rosuvastatin (CRESTOR) 20 MG tablet Take 1 tablet (20 mg total) by mouth daily. 03/29/22  Yes Marrianne Mood, MD  senna-docusate (SENOKOT-S)  8.6-50 MG tablet Take 2 tablets by mouth at bedtime.   Yes [provider]  sodium chloride (OCEAN) 0.65 % SOLN nasal spray Place 1 spray into both nostrils as needed for congestion. 04/16/22  Yes Kathlen Mody, MD  ferrous gluconate (FERGON) 324 MG tablet Take 1 tablet (324 mg total) by mouth daily with breakfast. 12/26/22 06/17/23  Rodolph Bong, MD    Physical Exam: Vitals:   07/02/23 0100 07/02/23 0115 07/02/23 0145 07/02/23 0148  BP:   (!) 127/92   Pulse: 73 79 75 77  Resp: 17 17 18 18   Temp:    97.9 F (36.6 C)  TempSrc:    Oral  SpO2: 96% 95% 97% 96%   Physical Exam Constitutional:      Appearance: He is normal weight.  HENT:     Head: Normocephalic.     Nose: Nose normal.     Mouth/Throat:     Mouth: Mucous membranes are moist.     Pharynx: Oropharynx is clear.  Eyes:     Conjunctiva/sclera: Conjunctivae normal.     Pupils: Pupils are equal, round, and reactive to light.  Cardiovascular:     Rate and Rhythm: Normal rate and regular rhythm.     Pulses: Normal pulses.  Pulmonary:     Effort: Pulmonary effort is normal.   Abdominal:     General: Abdomen is flat. Bowel sounds are normal.  Musculoskeletal:        General: Normal range of motion.     Cervical back: Normal range of motion.  Skin:    General: Skin is warm.     Capillary Refill: Capillary refill takes less than 2 seconds.  Neurological:     General: No focal deficit present.     Mental Status: He is alert.  Psychiatric:        Mood and Affect: Mood normal.   Rectal exam shows maroon-colored stools  Labs on Admission: I have personally reviewed the patients's labs and imaging studies.  Assessment/Plan Principal Problem:   Melena  # Hematochezia - Patient presented hematochezia in setting of stable hemoglobin - History of prostate cancer s/p radiation - Differential diagnosis includes diverticular, hemorrhoidal, radiation proctitis - GI consulted Dr. Dulce Sellar who will assess patient  Plan: Placed on clear liquid diet Trend hemoglobin Appc GI recommendations  # History of CVA/dementia-continue aspirin  # Hypertension-continue atenolol  # Hyperlipidemia-continue Crestor  # PAD-continue aspirin   Admission status: Observation Med-Surg  Certification: The appropriate patient status for this patient is OBSERVATION. Observation status is judged to be reasonable and necessary in order to provide the required intensity of service to ensure the patient's safety. The patient's presenting symptoms, physical exam findings, and initial radiographic and laboratory data in the context of their medical condition is felt to place them at decreased risk for further clinical deterioration. Furthermore, it is anticipated that the patient will be medically stable for discharge from the hospital within 2 midnights of admission.     Alan Mulder MD [facility] If 7PM-7AM, please contact night-coverage [email or web address]  07/02/2023, 2:10 AM

## 2023-07-02 NOTE — ED Notes (Signed)
Attempted to call 3 [facility] for report.  Unable to give report at this time.  Instructed to call back shortly.

## 2023-07-02 NOTE — Care Management Obs Status (Signed)
MEDICARE OBSERVATION STATUS NOTIFICATION   Patient Details  Name: Jason Jones MRN: [id number] Date of Birth: Jun 04, 1951   Medicare Observation Status Notification Given:  Yes    Baldemar Lenis, LCSW 07/02/2023, 2:29 PM

## 2023-07-02 NOTE — ED Notes (Signed)
ED TO INPATIENT HANDOFF REPORT  ED Nurse Name and Phone #: Angelique Holm RN [phone]  S Name/Age/Gender Jason Jones 73 y.o. male Room/Bed: WA02/WA02  Code Status   Code Status: Prior  Home/SNF/Other Skilled nursing facility Patient oriented to: self, place, time, and situation Is this baseline? Yes   Triage Complete: Triage complete  Chief Complaint Melena [K92.1] Intracranial hematoma [facility]) [S06.89AA]  Triage Note Patient BIB EMS from [facility] for evaluation of rectal bleeding.  Hx of anemia and GI bleeding.  Per report, patient had rectal bleeding x 3 today.  Pt has no reports of pain, nausea, or vomiting.    Allergies Allergies  Allergen Reactions   Ceftriaxone Swelling and Other (See Comments)    Possible lips swelling (dose 07/21/22) "Allergic," per facility   Doxycycline Other (See Comments)    "ALLERGIC," per facility document    Level of Care/Admitting Diagnosis ED Disposition     ED Disposition  Admit   Condition  --   Comment  Hospital Area[facility] [[id number]]  Level of Care: Progressive [102]  Admit to Progressive based on following criteria: NEUROLOGICAL AND NEUROSURGICAL complex patients with significant risk of instability, who do not meet ICU criteria, yet require close observation or frequent assessment (< / = every 2 - 4 hours) with medical / nursing intervention.  May admit patient to [facility] or [facility] if equivalent level of care is available:: No  Covid Evaluation: Asymptomatic - no recent exposure (last 10 days) testing not required  Diagnosis: Intracranial hematoma [facility]) [[id number]]  Admitting Physician: Alan Mulder [[id number]]  Attending Physician: Alan Mulder [[id number]]  Certification:: I certify this patient will need inpatient services for at least 2 midnights  Expected Medical Readiness: 07/04/2023          B Medical/Surgery History Past Medical History:  Diagnosis Date   Acute  ischemic right PCA stroke (HCC) 03/25/2022   Arthritis    Asthma    Cancer (HCC)    CAP (community acquired pneumonia) 07/20/2022   Chronic alcohol abuse    Dyspnea    GERD (gastroesophageal reflux disease)    PRN  ---  TAKES BAKING SODA IN WATER   History of pneumothorax    04-28-2006  fell, left fx rib--  resolved w/ chest tube   Hypertension    Nocturia    Poor dental hygiene    Right inguinal hernia    Weak urinary stream    Past Surgical History:  Procedure Laterality Date   COLONOSCOPY  01/08/2021   GOLD SEED IMPLANT N/A 05/23/2021   Procedure: GOLD SEED IMPLANT;  Surgeon: Rene Paci, MD;  Location: [facility];  Service: Urology;  Laterality: N/A;   INGUINAL HERNIA REPAIR Right 04/23/2017   Procedure: OPEN RIGHT INGUINAL HERNIA REPAIR WITH MESH;  Surgeon: Kinsinger, De Blanch, MD;  Location: [facility];  Service: General;  Laterality: Right;  GENERAL COMBINED WITH REGIONAL FOR POST OP PAIN    INSERTION OF MESH Right 04/23/2017   Procedure: INSERTION OF MESH;  Surgeon: Kinsinger, De Blanch, MD;  Location: [facility] [place];  Service: General;  Laterality: Right;  GENERAL COMBINED WITH REGIONAL FOR POST OP PAIN    LIPOMA EXCISION Right 02/22/2015   Procedure: RIGHT SHOULDER MASS EXCISION 15 CM SQ;  Surgeon: Rodman Pickle, MD;  Location: [facility] [place];  Service: General;  Laterality: Right;   SPACE OAR INSTILLATION N/A 05/23/2021   Procedure: SPACE OAR INSTILLATION;  Surgeon: Rene Paci, MD;  Location[facility];  Service: Urology;  Laterality: N/A;   TRANSRECTAL ULTRASOUND N/A 05/23/2021   Procedure: TRANSRECTAL ULTRASOUND;  Surgeon: Rene Paci, MD;  Location: [facility];  Service: Urology;  Laterality: N/A;     A IV Location/Drains/Wounds Patient Lines/Drains/Airways Status     Active Line/Drains/Airways     Name Placement date Placement time Site Days   Peripheral IV 07/01/23 18 G  Distal;Posterior;Right Forearm 07/01/23  2150  Forearm  1   Urethral Catheter 16 Fr. 07/01/23  2213   --  1   Pressure Injury 12/22/22 Ankle Anterior;Left Stage 2 -  Partial thickness loss of dermis presenting as a shallow open injury with a red, pink wound bed without slough. healing 12/22/22  0448  -- 192            Intake/Output Last 24 hours No intake or output data in the 24 hours ending 07/02/23 0026  Labs/Imaging Results for orders placed or performed during the hospital encounter of 07/01/23 (from the past 48 hours)  CBC     Status: Abnormal   Collection Time: 07/01/23 10:07 PM  Result Value Ref Range   WBC 5.7 4.0 - 10.5 K/uL   RBC 3.37 (L) 4.22 - 5.81 MIL/uL   Hemoglobin 9.8 (L) 13.0 - 17.0 g/dL   HCT 43.3 (L) 29.5 - 18.8 %   MCV 93.2 80.0 - 100.0 fL   MCH 29.1 26.0 - 34.0 pg   MCHC 31.2 30.0 - 36.0 g/dL   RDW 41.6 (H) 60.6 - 30.1 %   Platelets 296 150 - 400 K/uL   nRBC 0.0 0.0 - 0.2 %    Comment: Performed at [facility], [address] [place], [place] [address]  Basic metabolic panel     Status: Abnormal   Collection Time: 07/01/23 10:07 PM  Result Value Ref Range   Sodium 141 135 - 145 mmol/L   Potassium 4.1 3.5 - 5.1 mmol/L   Chloride 106 98 - 111 mmol/L   CO2 24 22 - 32 mmol/L   Glucose, Bld 113 (H) 70 - 99 mg/dL    Comment: Glucose reference range applies only to samples taken after fasting for at least 8 hours.   BUN 21 8 - 23 mg/dL   Creatinine, Ser 3.23 0.61 - 1.24 mg/dL   Calcium 9.1 8.9 - 55.7 mg/dL   GFR, Estimated >32 >20 mL/min    Comment: (NOTE) Calculated using the CKD-EPI Creatinine Equation (2021)    Anion gap 11 5 - 15    Comment: Performed at [facility], [address], [place], [place] [address]  Type and screen [facility]     Status: None   Collection Time: 07/01/23 10:07 PM  Result Value Ref Range   ABO/RH(D) A POS    Antibody Screen NEG    Sample Expiration       07/04/2023,2359 Performed at [facility], [address] [place], [place] [address]    No results found.  Pending Labs Unresulted Labs (From admission, onward)    None       Vitals/Pain Today's Vitals   07/01/23 2138 07/01/23 2139 07/01/23 2142 07/01/23 2200  BP:   124/76 116/73  Pulse: 70  79 81  Resp: 17  20 18   Temp:   98.4 F (36.9 C)   TempSrc:   Oral   SpO2: 97%  96% 96%  PainSc:  0-No pain  Isolation Precautions No active isolations  Medications Medications - No data to display  Mobility non-ambulatory     Focused Assessments Neuro Assessment Handoff:  Swallow screen pass?  Not ordered Cardiac Rhythm: Normal sinus rhythm       Neuro Assessment:   Neuro Checks:      Has TPA been given? No If patient is a Neuro Trauma and patient is going to OR before floor call report to 4N Charge nurse: [phone] or [phone]   R Recommendations: See Admitting Provider Note  Report given to:   Additional Notes: .

## 2023-07-02 NOTE — Progress Notes (Signed)
[facility] PLAN OF CARE NOTE Patient: Jason Jones ZOX:[id number]   PCP: [name], NP DOB: 1951/06/01   DOA: 07/01/2023   DOS: 07/02/2023    Patient was admitted by my colleague earlier on 07/02/2023. I have reviewed the H&P as well as assessment and plan and agree with the same. Important changes in the plan are listed below.  Plan of care: Principal Problem:   Hematochezia Active Problems:   Prostate cancer (HCC)   Primary hypertension   Dysphagia due to recent cerebrovascular accident (CVA)   BPH with urinary obstruction   Chronic hepatitis C (HCC)   Patient has no bleeding today so far. H&H relatively stable. [facility] consulted.  Will monitor recommendation.  Level of care: Med-Surg  Author: Lynden Oxford, MD  [name] 07/02/2023 5:42 PM   If 7PM-7AM, please contact night-coverage at [email or web address]

## 2023-07-02 NOTE — Care Management CC44 (Signed)
Condition Code 44 Documentation Completed  Patient Details  Name: Jason Jones MRN: [id number] Date of Birth: July 06, 1950   Condition Code 44 given:  Yes Patient signature on Condition Code 44 notice:  Yes Documentation of 2 MD's agreement:  Yes Code 44 added to claim:  Yes    Baldemar Lenis, LCSW 07/02/2023, 2:29 PM

## 2023-07-02 NOTE — Consult Note (Addendum)
Reason for Consult: Rectal bleeding. Referring Physician: [facility] hospitalist.  Jason Jones is an 73 y.o. male.  HPI: Jason Jones is a 73 year old black male with multiple medical problems listed below admitted for rectal bleeding that started early yesterday morning. He was brought to the emergency room by EMS.  He had some additional bloody bowel movements in the emergency room.  He denied having abdominal pain nausea vomiting.  He has a history of chronic constipation and can go 1 to 2 to 3 days without having a BM.  He is has a history of prostate cancer for which she has had radiation in the past.  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 He was scheduled to have a flexible sigmoidoscopy in the recent past but as he was not prep at the nursing home the procedure was not done.  His last colonoscopy was done in January 2022 when a small tubular adenoma was removed from the colon. He is hemoglobin was 9.8 g/dL on admission and 10 gms/dl today  Past Medical History:  Diagnosis Date   Acute ischemic right PCA stroke (HCC) 03/25/2022   Arthritis    Asthma    Prostate cancer cancer (HCC)    CAP (community acquired pneumonia) 07/20/2022   Chronic alcohol abuse    GERD (gastroesophageal reflux disease)    PRN  ---  TAKES BAKING SODA IN WATER   History of pneumothorax    04-28-2006  fell, left fx rib--  resolved w/ chest tube   Hypertension    BPH with prostatomegaly     Hyperlipidemia    Right inguinal hernia    ?  Dementia    Past Surgical History:  Procedure Laterality Date   COLONOSCOPY  01/08/2021   GOLD SEED IMPLANT N/A 05/23/2021   Procedure: GOLD SEED IMPLANT;  Surgeon: Rene Paci, MD;  Location: [facility];  Service: Urology;  Laterality: N/A;   INGUINAL HERNIA REPAIR Right 04/23/2017   Procedure: OPEN RIGHT INGUINAL HERNIA REPAIR WITH MESH;  Surgeon: Kinsinger, De Blanch, MD;  Location:  [facility];  Service: General;  Laterality: Right;  GENERAL COMBINED WITH REGIONAL FOR POST OP PAIN    INSERTION OF MESH Right 04/23/2017   Procedure: INSERTION OF MESH;  Surgeon: Kinsinger, De Blanch, MD;  Location: [facility];  Service: General;  Laterality: Right;  GENERAL COMBINED WITH REGIONAL FOR POST OP PAIN    LIPOMA EXCISION Right 02/22/2015   Procedure: RIGHT SHOULDER MASS EXCISION 15 CM SQ;  Surgeon: Rodman Pickle, MD;  Location: [facility];  Service: General;  Laterality: Right;   SPACE OAR INSTILLATION N/A 05/23/2021   Procedure: SPACE OAR INSTILLATION;  Surgeon: Rene Paci, MD;  Location: [facility];  Service: Urology;  Laterality: N/A;   TRANSRECTAL ULTRASOUND N/A 05/23/2021   Procedure: TRANSRECTAL ULTRASOUND;  Surgeon: Rene Paci, MD;  Location: [facility];  Service: Urology;  Laterality: N/A;    Family History  Problem Relation Age of Onset   Prostate cancer Cousin    Prostate cancer Cousin    Social History:  reports that he has been smoking cigarettes. He has a 25 pack-year smoking history. He has never used smokeless tobacco. He reports that he does not currently use alcohol. He reports that he does not currently use drugs after having used the following drugs: Marijuana.  Allergies:  Allergies  Allergen Reactions   Ceftriaxone Swelling  and Other (See Comments)    Possible lips swelling (dose 07/21/22) "Allergic," per facility   Doxycycline Other (See Comments)    "ALLERGIC," per facility document   Medications: I have reviewed the patient's current medications. Prior to Admission:  Medications Prior to Admission  Medication Sig Dispense Refill Last Dose/Taking   acetaminophen (TYLENOL) 500 MG tablet Take 2 tablets (1,000 mg total) by mouth every 8 (eight) hours as needed for mild pain, headache or moderate pain. (Patient taking differently: Take 1,000 mg by mouth in the morning and at  bedtime.) 30 tablet 0 07/01/2023 at  8:00 PM   acetaminophen (TYLENOL) 650 MG CR tablet Take 650 mg by mouth as needed for pain or fever.   Taking As Needed   albuterol (PROVENTIL) (2.5 MG/3ML) 0.083% nebulizer solution Take 3 mLs (2.5 mg total) by nebulization every 4 (four) hours as needed for wheezing or shortness of breath. 75 mL 12 Taking As Needed   Amino Acids-Protein Hydrolys (PRO-STAT 64 PO) Take 30 mLs by mouth in the morning and at bedtime.   07/01/2023 at  8:00 PM   aspirin 81 MG chewable tablet Chew 1 tablet (81 mg total) by mouth daily. (Patient taking differently: Chew 81 mg by mouth in the morning.)   07/01/2023 at  9:00 AM   atenolol (TENORMIN) 25 MG tablet Take 12.5 mg by mouth every 12 (twelve) hours as needed (BP>130).   Taking As Needed   b complex vitamins capsule Take 1 capsule by mouth in the morning.   07/01/2023 at  9:00 AM   baclofen (LIORESAL) 10 MG tablet Take 0.5 tablets (5 mg total) by mouth 2 (two) times daily. Take 7.5 mg by mouth in the morning and at 5 PM (Patient taking differently: Take 7.5 mg by mouth 2 (two) times daily.) 30 each 0 07/01/2023 at  9:00 PM   Baclofen 5 MG TABS Take 7.5 mg by mouth in the morning and at bedtime.   07/01/2023 at  9:00 PM   doxazosin (CARDURA) 1 MG tablet Take 1 mg by mouth in the morning.   07/01/2023 at  9:00 AM   Emollient (AQUAPHOR OINTMENT BODY EX) Apply 1 application  topically See admin instructions. Apply to the lower legs and feet every shift   07/01/2023 Evening   ferrous sulfate 325 (65 FE) MG EC tablet Take 325 mg by mouth daily with breakfast.   07/01/2023 at  9:00 AM   melatonin 5 MG TABS Take 5 mg by mouth at bedtime.   07/01/2023 at  9:00 PM   NON FORMULARY Take 120 mLs by mouth See admin instructions. MedPass 2.0 - Drink 120 ml's by mouth three times a day   07/01/2023 at  8:00 PM   omeprazole (PRILOSEC) 20 MG capsule Take 20 mg by mouth at bedtime.   07/01/2023 at  8:00 PM   polyethylene glycol (MIRALAX / GLYCOLAX) 17 g packet  Take 17 g by mouth daily as needed. (Patient taking differently: Take 17 g by mouth daily as needed (for constipation).) 14 each 0 Taking Differently   rosuvastatin (CRESTOR) 20 MG tablet Take 1 tablet (20 mg total) by mouth daily.   07/01/2023 at  9:00 PM   senna-docusate (SENOKOT-S) 8.6-50 MG tablet Take 2 tablets by mouth at bedtime.   07/01/2023 at  9:00 PM   sodium chloride (OCEAN) 0.65 % SOLN nasal spray Place 1 spray into both nostrils as needed for congestion. 15 mL 0 Taking As Needed   ferrous  gluconate (FERGON) 324 MG tablet Take 1 tablet (324 mg total) by mouth daily with breakfast.      Scheduled:  baclofen  10 mg Oral BID   Chlorhexidine Gluconate Cloth  6 each Topical Daily   doxazosin  1 mg Oral Daily   rosuvastatin  20 mg Oral Daily   Continuous: QQV:ZDGLOVFIEPPIR **OR** acetaminophen, atenolol, ondansetron **OR** ondansetron (ZOFRAN) IV  Results for orders placed or performed during the hospital encounter of 07/01/23 (from the past 48 hours)  CBC     Status: Abnormal   Collection Time: 07/01/23 10:07 PM  Result Value Ref Range   WBC 5.7 4.0 - 10.5 K/uL   RBC 3.37 (L) 4.22 - 5.81 MIL/uL   Hemoglobin 9.8 (L) 13.0 - 17.0 g/dL   HCT 51.8 (L) 84.1 - 66.0 %   MCV 93.2 80.0 - 100.0 fL   MCH 29.1 26.0 - 34.0 pg   MCHC 31.2 30.0 - 36.0 g/dL   RDW 63.0 (H) 16.0 - 10.9 %   Platelets 296 150 - 400 K/uL   nRBC 0.0 0.0 - 0.2 %    Comment: Performed at [facility], [address], [place], [place] [address]  Basic metabolic panel     Status: Abnormal   Collection Time: 07/01/23 10:07 PM  Result Value Ref Range   Sodium 141 135 - 145 mmol/L   Potassium 4.1 3.5 - 5.1 mmol/L   Chloride 106 98 - 111 mmol/L   CO2 24 22 - 32 mmol/L   Glucose, Bld 113 (H) 70 - 99 mg/dL    Comment: Glucose reference range applies only to samples taken after fasting for at least 8 hours.   BUN 21 8 - 23 mg/dL   Creatinine, Ser 7.32 0.61 - 1.24 mg/dL   Calcium 9.1 8.9 - 20.2 mg/dL    GFR, Estimated >54 >27 mL/min    Comment: (NOTE) Calculated using the CKD-EPI Creatinine Equation (2021)    Anion gap 11 5 - 15    Comment: Performed at [facility], [address], [place], [place] [address]  Type and screen [facility]     Status: None   Collection Time: 07/01/23 10:07 PM  Result Value Ref Range   ABO/RH(D) A POS    Antibody Screen NEG    Sample Expiration      07/04/2023,2359 Performed at [facility], [address], [place], [place] [address]   CBC     Status: Abnormal   Collection Time: 07/02/23  5:10 AM  Result Value Ref Range   WBC 7.0 4.0 - 10.5 K/uL   RBC 3.27 (L) 4.22 - 5.81 MIL/uL   Hemoglobin 9.3 (L) 13.0 - 17.0 g/dL   HCT 51.7 (L) 61.6 - 07.3 %   MCV 92.0 80.0 - 100.0 fL   MCH 28.4 26.0 - 34.0 pg   MCHC 30.9 30.0 - 36.0 g/dL   RDW 71.0 (H) 62.6 - 94.8 %   Platelets 308 150 - 400 K/uL   nRBC 0.0 0.0 - 0.2 %    Comment: Performed at [facility], [address] [place], [place] [address]  CBC     Status: Abnormal   Collection Time: 07/02/23 11:45 AM  Result Value Ref Range   WBC 6.6 4.0 - 10.5 K/uL   RBC 3.45 (L) 4.22 - 5.81 MIL/uL   Hemoglobin 10.0 (L) 13.0 - 17.0 g/dL   HCT 03.5 (L) 00.9 - 38.1 %  MCV 91.6 80.0 - 100.0 fL   MCH 29.0 26.0 - 34.0 pg   MCHC 31.6 30.0 - 36.0 g/dL   RDW 16.1 (H) 09.6 - 04.5 %   Platelets 327 150 - 400 K/uL   nRBC 0.0 0.0 - 0.2 %    Comment: Performed at [facility], [address], [place], [place] [address]    No results found.  Review of Systems  Constitutional:  Negative for activity change, appetite change, chills, diaphoresis, fatigue, fever and unexpected weight change.  HENT: Negative.    Eyes: Negative.   Respiratory: Negative.    Cardiovascular: Negative.   Gastrointestinal:  Positive for anal bleeding, blood in stool and constipation. Negative for abdominal distention, abdominal pain, diarrhea, nausea, rectal pain and  vomiting.  Genitourinary:  Positive for decreased urine volume, difficulty urinating and frequency.  Musculoskeletal:  Positive for arthralgias.  Allergic/Immunologic: Negative.   Neurological: Negative.   Hematological: Negative.   Psychiatric/Behavioral:  Positive for confusion.    Blood pressure 107/61, pulse 83, temperature 98.1 F (36.7 C), temperature source Oral, resp. rate 16, SpO2 97%. Physical Exam Constitutional:      General: He is not in acute distress.    Comments: Elderly black male with contractured extremities  HENT:     Head: Normocephalic and atraumatic.     Comments: Patient is edentulous    Mouth/Throat:     Mouth: Mucous membranes are dry.  Eyes:     Pupils: Pupils are equal, round, and reactive to light.  Cardiovascular:     Rate and Rhythm: Normal rate and regular rhythm.     Pulses: Normal pulses.     Heart sounds: Normal heart sounds.  Pulmonary:     Effort: Pulmonary effort is normal.     Breath sounds: Normal breath sounds.  Abdominal:     General: Bowel sounds are normal. There is no distension.     Palpations: There is no mass.     Tenderness: There is no abdominal tenderness. There is no right CVA tenderness, guarding or rebound.     Hernia: No hernia is present.  Musculoskeletal:     Cervical back: Neck supple.  Skin:    General: Skin is warm and dry.  Neurological:     Mental Status: He is alert.    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 Patient will benefit from a flexible sigmoidoscopy to be done over the next couple of days.   2) History of GERD on Omeprazole. 3) Chronic constipation. 4) History of tubular adenomatous polyps in the colon 5) Hypertension/hyperlipidemia 6) PAD on Aspirin 7) History of CVA/dementia. 8) History of BPH/prostate cancer. Charna Elizabeth 07/02/2023, 2:11 PM

## 2023-07-02 NOTE — ED Notes (Signed)
Bedside report given to [facility].  SBAR charted to floor review

## 2023-07-02 NOTE — TOC Initial Note (Signed)
Transition of Care [facility]) - Initial/Assessment Note    Patient Details  Name: Jason Jones MRN: [id number] Date of Birth: 06-20-1950  [facility] CM/SW Contact:    Evaleigh Mccamy Felipa [place], Student-Social Work Phone Number: 07/02/2023, 2:36 PM  Clinical Narrative:   MSW Student and CSW met with patient in room and spoke to him about wanting to get back to [facility]. Patient was agreeable and indicated that he wants to go back after he gets the help he needs.                 Expected Discharge Plan: Skilled Nursing Facility Barriers to Discharge: Continued Medical Work up   Patient Goals and CMS Choice Patient states their goals for this hospitalization and ongoing recovery are:: To get back to SNF [facility].gov Compare Post Acute Care list provided to:: Patient Choice offered to / list presented to : Patient [place] ownership interest in [facility].provided to:: Patient    Expected Discharge Plan and Services     Post Acute Care Choice: Skilled Nursing Facility Living arrangements for the past 2 months: Skilled Nursing Facility                                      Prior Living Arrangements/Services Living arrangements for the past 2 months: Skilled Nursing Facility Lives with:: Self Patient language and need for interpreter reviewed:: No Do you feel safe going back to the place where you live?: Yes      Need for Family Participation in Patient Care: No (Comment) Care giver support system in place?: Yes (comment)   Criminal Activity/Legal Involvement Pertinent to Current Situation/Hospitalization: No - Comment as needed  Activities of Daily Living      Permission Sought/Granted Permission sought to share information with : Case Manager, Family Supports Permission granted to share information with : Yes, Verbal Permission Granted  Share Information with NAME: Shellia Carwin  Permission granted to share info w AGENCY:  [facility]  Permission granted to share info w Relationship: Daughter, Brother     Emotional Assessment Appearance:: Appears stated age Attitude/Demeanor/Rapport: Engaged Affect (typically observed): Appropriate Orientation: : Oriented to Self, Oriented to Place, Oriented to  Time, Oriented to Situation Alcohol / Substance Use: Not Applicable Psych Involvement: No (comment)  Admission diagnosis:  Hematochezia [K92.1] Melena [K92.1] Rectal bleeding [K62.5] Intracranial hematoma (HCC) [S06.89AA] Patient Active Problem List   Diagnosis Date Noted   Melena 07/01/2023   Bacteriuria 12/26/2022   Acute cystitis with hematuria 12/25/2022   Acute renal failure superimposed on stage 3a chronic kidney disease (HCC) 12/21/2022   Acute metabolic encephalopathy 12/21/2022   Urinary retention 07/31/2022   Hydronephrosis, bilateral 07/31/2022   Urinary tract infection associated with indwelling urethral catheter (HCC) 07/31/2022   Leukocytosis 07/30/2022   Pressure injury of skin 07/20/2022   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   Chronic hepatitis C (HCC) 07/20/2022   Protein-calorie malnutrition, severe (HCC) 07/20/2022   Rectal bleeding 07/20/2022   Normocytic anemia 06/28/2022   Hematuria 05/01/2022   History of CVA (cerebrovascular accident) 04/17/2022   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   Malnutrition of moderate degree 04/15/2022   Primary hypertension 03/25/2022   Dysphagia due to recent cerebrovascular accident (CVA) 03/25/2022   Elevated liver enzymes 03/25/2022   Rhabdomyolysis 03/25/2022   Prostate cancer (HCC) 02/20/2021   PCP:  [name], NP Pharmacy:   [facility] [place], [place] - [address] [place] [address] Phone: [phone] Fax: [phone]     Social Drivers of Health (SDOH) Social History: SDOH Screenings   Food Insecurity: No Food Insecurity (07/02/2023)  Housing: Low Risk  (07/02/2023)   Transportation Needs: No Transportation Needs (07/02/2023)  Utilities: Not At Risk (07/02/2023)  Tobacco Use: High Risk (07/01/2023)   SDOH Interventions:     Readmission Risk Interventions    08/05/2022   12:17 PM 07/31/2022   12:11 PM  Readmission Risk Prevention Plan  Transportation Screening Complete Complete  PCP or Specialist Appt within 3-5 Days  Complete  HRI or Home Care Consult  Complete  Social Work Consult for Recovery Care Planning/Counseling  Complete  Palliative Care Screening  Not Applicable  Medication Review Oceanographer) Complete Complete  PCP or Specialist appointment within 3-5 days of discharge Complete   HRI or Home Care Consult Complete   SW Recovery Care/Counseling Consult Complete   Palliative Care Screening Not Applicable   Skilled Nursing Facility Complete

## 2023-07-03 DIAGNOSIS — K921 Melena: Secondary | ICD-10-CM | POA: Diagnosis not present

## 2023-07-03 LAB — CBC
HCT: 29.6 % — ABNORMAL LOW (ref 39.0–52.0)
HCT: 31 % — ABNORMAL LOW (ref 39.0–52.0)
HCT: 32.2 % — ABNORMAL LOW (ref 39.0–52.0)
Hemoglobin: 9.3 g/dL — ABNORMAL LOW (ref 13.0–17.0)
Hemoglobin: 9.7 g/dL — ABNORMAL LOW (ref 13.0–17.0)
Hemoglobin: 10.2 g/dL — ABNORMAL LOW (ref 13.0–17.0)
MCH: 28.6 pg (ref 26.0–34.0)
MCH: 28.4 pg (ref 26.0–34.0)
MCH: 28.9 pg (ref 26.0–34.0)
MCHC: 31.4 g/dL (ref 30.0–36.0)
MCHC: 31.3 g/dL (ref 30.0–36.0)
MCHC: 31.7 g/dL (ref 30.0–36.0)
MCV: 91.1 fL (ref 80.0–100.0)
MCV: 90.9 fL (ref 80.0–100.0)
MCV: 91.2 fL (ref 80.0–100.0)
Platelets: 312 10*3/uL (ref 150–400)
Platelets: 324 10*3/uL (ref 150–400)
Platelets: 330 10*3/uL (ref 150–400)
RBC: 3.25 MIL/uL — ABNORMAL LOW (ref 4.22–5.81)
RBC: 3.41 MIL/uL — ABNORMAL LOW (ref 4.22–5.81)
RBC: 3.53 MIL/uL — ABNORMAL LOW (ref 4.22–5.81)
RDW: 15.8 % — ABNORMAL HIGH (ref 11.5–15.5)
RDW: 15.8 % — ABNORMAL HIGH (ref 11.5–15.5)
RDW: 15.9 % — ABNORMAL HIGH (ref 11.5–15.5)
WBC: 6 10*3/uL (ref 4.0–10.5)
WBC: 5.6 10*3/uL (ref 4.0–10.5)
WBC: 6.9 10*3/uL (ref 4.0–10.5)
nRBC: 0 % (ref 0.0–0.2)
nRBC: 0 % (ref 0.0–0.2)
nRBC: 0 % (ref 0.0–0.2)

## 2023-07-03 LAB — TYPE AND SCREEN
ABO/RH(D): A POS
Antibody Screen: NEGATIVE

## 2023-07-03 MED ORDER — CHLORHEXIDINE GLUCONATE CLOTH 2 % EX PADS
6.0000 | MEDICATED_PAD | Freq: Every day | CUTANEOUS | Status: DC
  Administered 2023-07-04: 6 via TOPICAL

## 2023-07-03 MED ORDER — MUPIROCIN 2 % EX OINT
1.0000 | TOPICAL_OINTMENT | Freq: Two times a day (BID) | CUTANEOUS | Status: DC
  Administered 2023-07-03 – 2023-07-05 (×4): 1 via NASAL
  Filled 2023-07-03 (×2): qty 22

## 2023-07-03 MED ORDER — FLEET ENEMA RE ENEM
2.0000 | ENEMA | Freq: Once | RECTAL | Status: AC
  Administered 2023-07-04: 2 via RECTAL
  Filled 2023-07-03: qty 2

## 2023-07-03 NOTE — Progress Notes (Signed)
pt's chronic foley cath came out while he was getting bathed by NT--NT states it was not pulled on, "it was laying on the bed".  The balloon was noted to be  broken, not intact, no fluid in balloon area.  Pt was admitted with foley cath in place from SNF. Pt denies pain, no bleeding noted.  Secure chat message sent to MD of the above and request to place a new foley cath.  Jerek Meulemans,RN

## 2023-07-03 NOTE — Plan of Care (Signed)

## 2023-07-03 NOTE — Plan of Care (Signed)

## 2023-07-03 NOTE — H&P (View-Only) (Signed)
Subjective: No further hematochezia.  Objective: Vital signs in last 24 hours: Temp:  [98 F (36.7 C)-98.9 F (37.2 C)] 98.5 F (36.9 C) (01/23 1104) Pulse Rate:  [74-98] 86 (01/23 1104) Resp:  [16-18] 16 (01/23 1104) BP: (81-127)/(61-76) 94/65 (01/23 1104) SpO2:  [93 %-97 %] 94 % (01/23 1104) Last BM Date : 07/02/23  Intake/Output from previous day: 01/22 0701 - 01/23 0700 In: -  Out: 1275 [Urine:1275] Intake/Output this shift: Total I/O In: -  Out: 200 [Urine:200]  General appearance: alert and no distress GI: soft, non-tender; bowel sounds normal; no masses,  no organomegaly  Lab Results: Recent Labs    07/02/23 1753 07/03/23 0505 07/03/23 1055  WBC 8.6 6.0 5.6  HGB 9.6* 9.3* 9.7*  HCT 30.0* 29.6* 31.0*  PLT 311 312 324   BMET Recent Labs    07/01/23 2207  NA 141  K 4.1  CL 106  CO2 24  GLUCOSE 113*  BUN 21  CREATININE 0.94  CALCIUM 9.1   LFT No results for input(s): "PROT", "ALBUMIN", "AST", "ALT", "ALKPHOS", "BILITOT", "BILIDIR", "IBILI" in the last 72 hours. PT/INR No results for input(s): "LABPROT", "INR" in the last 72 hours. Hepatitis Panel No results for input(s): "HEPBSAG", "HCVAB", "HEPAIGM", "HEPBIGM" in the last 72 hours. C-Diff No results for input(s): "CDIFFTOX" in the last 72 hours. Fecal Lactopherrin No results for input(s): "FECLLACTOFRN" in the last 72 hours.  Studies/Results: No results found.  Medications: Scheduled:  baclofen  10 mg Oral BID   Chlorhexidine Gluconate Cloth  6 each Topical Daily   Chlorhexidine Gluconate Cloth  6 each Topical Q0600   doxazosin  1 mg Oral Daily   mupirocin ointment  1 Application Nasal BID   rosuvastatin  20 mg Oral Daily   [START ON 07/04/2023] sodium phosphate  2 enema Rectal Once   Continuous:  Assessment/Plan: 1) Hematochezia. 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   The patient will be prepped for a FFS with possible APC tomorrow.  He was supposed to have this procedure to address his hematochezia  several weeks ago, but his nursing facility did not prep him for the procedure.  His HGB is stable.  Plan: 1) FFS with possible APC.  LOS: 1 day   Danylah Holden D 07/03/2023, 11:33 AM

## 2023-07-03 NOTE — Progress Notes (Signed)
[facility] Progress Note Patient: Jason Jones VOZ:[id number] DOB: [birth year] DOA: 07/01/2023  DOS: the patient was seen and examined on 07/03/2023  Brief Hospital Course: Airon Shifflett is a 73 y.o. male with medical history significant for prostate cancer, BPH, chronic Foley catheter, and history of CVA presented from [facility] with hematochezia. GI consulted. Currently plan for flexible sigmoidoscopy with APC. Assessment and Plan: Hematochezia. Acute blood loss anemia Appreciate GI consultation. Scheduled for flexible sigmoidoscopy. 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  Patient may require APC.  Will monitor. N.p.o. after midnight.  HTN. Will continue current medication.  Prior history of CVA with left-sided weakness. On aspirin currently on hold due to hematochezia.  HLD. Continue Crestor.  PAD. On aspirin. Again hold due to hematochezia.  Subjective: No nausea no vomiting no fever no chills.  Unfortunately his Foley catheter was dislodged.  No trauma.  Physical Exam: General: in Mild distress, No Rash Cardiovascular: S1 and S2 Present, No Murmur Respiratory: Good respiratory effort, Bilateral Air entry present. No Crackles, No wheezes Abdomen: Bowel Sound present, No tenderness Extremities: No edema Neuro: Alert and oriented x3, no new focal deficit, chronic dysarthria, chronic left upper extremity contracture and weakness.  Data Reviewed: I have Reviewed nursing notes, Vitals, and Lab results. Since last encounter, pertinent lab results CBC and BMP   . I have ordered test including CBC and BMP  .   Disposition: Status is: Observation  SCDs Start: 07/02/23 0207   Family Communication: No one at bedside Level of care: Med-Surg   Vitals:   07/03/23 0005 07/03/23 0900 07/03/23 1104 07/03/23 1515  BP: (!) 81/67 93/63 94/65  90/60  Pulse: 98 74 86 100  Resp:  17 16 20   Temp: 98 F (36.7 C) 98.9 F (37.2 C) 98.5 F (36.9 C) 99 F (37.2 C)   TempSrc: Oral Oral Oral Oral  SpO2: 93% 96% 94% 99%     Author: Lynden Oxford, MD 07/03/2023 7:07 PM  Please look on [email or web address] to find out who is on call.

## 2023-07-03 NOTE — Progress Notes (Signed)
Subjective: No further hematochezia.  Objective: Vital signs in last 24 hours: Temp:  [98 F (36.7 C)-98.9 F (37.2 C)] 98.5 F (36.9 C) (01/23 1104) Pulse Rate:  [74-98] 86 (01/23 1104) Resp:  [16-18] 16 (01/23 1104) BP: (81-127)/(61-76) 94/65 (01/23 1104) SpO2:  [93 %-97 %] 94 % (01/23 1104) Last BM Date : 07/02/23  Intake/Output from previous day: 01/22 0701 - 01/23 0700 In: -  Out: 1275 [Urine:1275] Intake/Output this shift: Total I/O In: -  Out: 200 [Urine:200]  General appearance: alert and no distress GI: soft, non-tender; bowel sounds normal; no masses,  no organomegaly  Lab Results: Recent Labs    07/02/23 1753 07/03/23 0505 07/03/23 1055  WBC 8.6 6.0 5.6  HGB 9.6* 9.3* 9.7*  HCT 30.0* 29.6* 31.0*  PLT 311 312 324   BMET Recent Labs    07/01/23 2207  NA 141  K 4.1  CL 106  CO2 24  GLUCOSE 113*  BUN 21  CREATININE 0.94  CALCIUM 9.1   LFT No results for input(s): "PROT", "ALBUMIN", "AST", "ALT", "ALKPHOS", "BILITOT", "BILIDIR", "IBILI" in the last 72 hours. PT/INR No results for input(s): "LABPROT", "INR" in the last 72 hours. Hepatitis Panel No results for input(s): "HEPBSAG", "HCVAB", "HEPAIGM", "HEPBIGM" in the last 72 hours. C-Diff No results for input(s): "CDIFFTOX" in the last 72 hours. Fecal Lactopherrin No results for input(s): "FECLLACTOFRN" in the last 72 hours.  Studies/Results: No results found.  Medications: Scheduled:  baclofen  10 mg Oral BID   Chlorhexidine Gluconate Cloth  6 each Topical Daily   Chlorhexidine Gluconate Cloth  6 each Topical Q0600   doxazosin  1 mg Oral Daily   mupirocin ointment  1 Application Nasal BID   rosuvastatin  20 mg Oral Daily   [START ON 07/04/2023] sodium phosphate  2 enema Rectal Once   Continuous:  Assessment/Plan: 1) Hematochezia. 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   The patient will be prepped for a FFS with possible APC tomorrow.  He was supposed to have this procedure to address his hematochezia  several weeks ago, but his nursing facility did not prep him for the procedure.  His HGB is stable.  Plan: 1) FFS with possible APC.  LOS: 1 day   Jaykob Minichiello D 07/03/2023, 11:33 AM

## 2023-07-03 NOTE — Progress Notes (Signed)
Patient has excellent vasculature in right arm with tourniquet; please assess/attempt prior to IVT consult

## 2023-07-04 ENCOUNTER — Observation Stay (HOSPITAL_COMMUNITY): Payer: 59 | Admitting: Certified Registered"

## 2023-07-04 ENCOUNTER — Encounter (HOSPITAL_COMMUNITY): Admission: EM | Disposition: A | Discharge status: Home / Self Care | Payer: Self-pay | Source: Home / Self Care

## 2023-07-04 DIAGNOSIS — I129 Hypertensive chronic kidney disease with stage 1 through stage 4 chronic kidney disease, or unspecified chronic kidney disease: Secondary | ICD-10-CM

## 2023-07-04 DIAGNOSIS — N1831 Chronic kidney disease, stage 3a: Secondary | ICD-10-CM | POA: Diagnosis not present

## 2023-07-04 DIAGNOSIS — K921 Melena: Secondary | ICD-10-CM | POA: Diagnosis not present

## 2023-07-04 DIAGNOSIS — K627 Radiation proctitis: Secondary | ICD-10-CM

## 2023-07-04 HISTORY — PX: FLEXIBLE SIGMOIDOSCOPY: SHX5431

## 2023-07-04 SURGERY — SIGMOIDOSCOPY, FLEXIBLE
Anesthesia: Monitor Anesthesia Care

## 2023-07-04 MED ORDER — PROPOFOL 500 MG/50ML IV EMUL
INTRAVENOUS | Status: DC | PRN
  Administered 2023-07-04: 35 ug/kg/min via INTRAVENOUS

## 2023-07-04 MED ORDER — PROPOFOL 10 MG/ML IV BOLUS
INTRAVENOUS | Status: DC | PRN
  Administered 2023-07-04: 20 mg via INTRAVENOUS

## 2023-07-04 MED ORDER — PHENYLEPHRINE 80 MCG/ML (10ML) SYRINGE FOR IV PUSH (FOR BLOOD PRESSURE SUPPORT)
PREFILLED_SYRINGE | INTRAVENOUS | Status: DC | PRN
  Administered 2023-07-04: 80 ug via INTRAVENOUS
  Administered 2023-07-04 (×2): 100 ug via INTRAVENOUS
  Administered 2023-07-04: 80 ug via INTRAVENOUS

## 2023-07-04 MED ORDER — LIDOCAINE 2% (20 MG/ML) 5 ML SYRINGE
INTRAMUSCULAR | Status: DC | PRN
  Administered 2023-07-04: 40 mg via INTRAVENOUS

## 2023-07-04 MED ORDER — LACTATED RINGERS IV SOLN: INTRAVENOUS | Status: DC | PRN

## 2023-07-04 MED ORDER — BACLOFEN 10 MG PO TABS
5.0000 mg | ORAL_TABLET | Freq: Two times a day (BID) | ORAL | Status: DC
  Administered 2023-07-04 – 2023-07-05 (×2): 5 mg via ORAL
  Filled 2023-07-04 (×2): qty 1

## 2023-07-04 NOTE — Anesthesia Preprocedure Evaluation (Signed)
Anesthesia Evaluation  Patient identified by MRN, date of birth, ID band Patient awake    Reviewed: Allergy & Precautions, NPO status , Patient's Chart, lab work & pertinent test results  Airway Mallampati: II  TM Distance: >3 FB     Dental  (+) Dental Advisory Given   Pulmonary shortness of breath, asthma , Current Smoker   breath sounds clear to auscultation       Cardiovascular hypertension,  Rhythm:Regular Rate:Normal     Neuro/Psych  Neuromuscular disease CVA    GI/Hepatic ,GERD  ,,(+) Hepatitis -, CGI bleed    Endo/Other  negative endocrine ROS    Renal/GU Renal disease     Musculoskeletal   Abdominal   Peds  Hematology  (+) Blood dyscrasia, anemia   Anesthesia Other Findings   Reproductive/Obstetrics                             Anesthesia Physical Anesthesia Plan  ASA: 4  Anesthesia Plan: MAC   Post-op Pain Management: Minimal or no pain anticipated   Induction:   PONV Risk Score and Plan: 0 and Propofol infusion  Airway Management Planned: Natural Airway and Simple Face Mask  Additional Equipment: None  Intra-op Plan:   Post-operative Plan:   Informed Consent: I have reviewed the patients History and Physical, chart, labs and discussed the procedure including the risks, benefits and alternatives for the proposed anesthesia with the patient or authorized representative who has indicated his/her understanding and acceptance.   Patient has DNR.  Discussed DNR with patient and Suspend DNR.     Plan Discussed with:   Anesthesia Plan Comments:        Anesthesia Quick Evaluation

## 2023-07-04 NOTE — Progress Notes (Addendum)
[facility] Progress Note Patient: Jason Jones ZOX:[id number] DOB: 30-Aug-1950 DOA: 07/01/2023  DOS: the patient was seen and examined on 07/04/2023  Brief Hospital Course: Jason Jones is a 73 y.o. male with medical history significant for prostate cancer, BPH, chronic Foley catheter, and history of CVA presented from [facility] with hematochezia. GI consulted. Currently plan for flexible sigmoidoscopy with APC. Assessment and Plan: Hematochezia. Acute blood loss anemia Appreciate GI consultation. Scheduled for flexible sigmoidoscopy. 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  Underwent flexible sigmoidoscopy although bowel was not prepped well.  Unable to perform procedure in the hospital and GI recommending outpatient follow-up for now.  HTN. Hypotension Blood dropping lower postprocedure. Will discontinue doxazosin as there is no indication to continue this medication when he has chronic Foley catheter.  Will reduce the dose of the baclofen as well.  And to discontinue atenolol.  Prior history of CVA with left-sided weakness. On aspirin currently on hold due to hematochezia.  HLD. Continue Crestor.  PAD. On aspirin. Again hold due to hematochezia.  Subjective: No complaint.  No nausea no vomiting.  Physical Exam: Clear to auscultation. Bowel sound present.  Nontender.  Data Reviewed: I have Reviewed nursing notes, Vitals, and Lab results. Discussed with GI. Reviewed CBC.  Disposition: Status is: Observation back to [facility].  SCDs Start: 07/02/23 0207   Family Communication: No one at bedside Level of care: Med-Surg   Vitals:   07/04/23 1445 07/04/23 1450 07/04/23 1527 07/04/23 1942  BP:  120/62 (!) 96/58 113/72  Pulse: 81 73 92 (!) 111  Resp: 17 16 18 18   Temp:   98 F (36.7 C) (!) 97.4 F (36.3 C)  TempSrc:    Oral  SpO2: 95% 94% 94% 95%  Weight:      Height:         Author: Lynden Oxford, MD 07/04/2023 8:10 PM  Please look  on [email or web address] to find out who is on call.

## 2023-07-04 NOTE — Transfer of Care (Signed)
Immediate Anesthesia Transfer of Care Note  Patient: Jason Jones  Procedure(s) Performed: FLEXIBLE SIGMOIDOSCOPY  Patient Location: PACU  Anesthesia Type:MAC  Level of Consciousness: awake, alert , oriented, and patient cooperative  Airway & Oxygen Therapy: Patient Spontanous Breathing and Patient connected to nasal cannula oxygen  Post-op Assessment: Report given to RN and Post -op Vital signs reviewed and stable  Post vital signs: Reviewed and stable  Last Vitals:  Vitals Value Taken Time  BP    Temp    Pulse    Resp    SpO2      Last Pain:  Vitals:   07/04/23 1252  TempSrc: Temporal  PainSc: 0-No pain         Complications: No notable events documented.

## 2023-07-04 NOTE — NC FL2 (Signed)
[place] MEDICAID FL2 LEVEL OF CARE FORM     IDENTIFICATION  Patient Name: Jason Jones Birthdate: [birth year] Sex: male Admission Date (Current Location): 07/01/2023  [facility] and [place] Number:  Chiropodist and Address:  [facility], [address] [place], [place] [address]      Provider Number: [id number]  Attending Physician Name and Address:  Rolly Salter, MD  Relative Name and Phone Number:       Current Level of Care: Hospital Recommended Level of Care: Skilled Nursing Facility Prior Approval Number:    Date Approved/Denied:   PASRR Number: [id number]  Discharge Plan: SNF    Current Diagnoses: Patient Active Problem List   Diagnosis Date Noted   Hematochezia 07/02/2023   Bacteriuria 12/26/2022   Acute cystitis with hematuria 12/25/2022   Acute renal failure superimposed on stage 3a chronic kidney disease (HCC) 12/21/2022   Acute metabolic encephalopathy 12/21/2022   Urinary retention 07/31/2022   Hydronephrosis, bilateral 07/31/2022   Urinary tract infection associated with indwelling urethral catheter (HCC) 07/31/2022   Leukocytosis 07/30/2022   Pressure injury of skin 07/20/2022   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   Chronic hepatitis C (HCC) 07/20/2022   Protein-calorie malnutrition, severe (HCC) 07/20/2022   Rectal bleeding 07/20/2022   Normocytic anemia 06/28/2022   Hematuria 05/01/2022   History of CVA (cerebrovascular accident) 04/17/2022   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   Malnutrition of moderate degree 04/15/2022   Primary hypertension 03/25/2022   Dysphagia due to recent cerebrovascular accident (CVA) 03/25/2022   Elevated liver enzymes 03/25/2022   Rhabdomyolysis 03/25/2022   Prostate cancer (HCC) 02/20/2021    Orientation RESPIRATION BLADDER Height & Weight     Self, Time, Situation, Place  Normal Continent, Indwelling catheter Weight:   Height:     BEHAVIORAL SYMPTOMS/MOOD  NEUROLOGICAL BOWEL NUTRITION STATUS      Continent Diet (see DC summary)  AMBULATORY STATUS COMMUNICATION OF NEEDS Skin   Extensive Assist Verbally Normal                       Personal Care Assistance Level of Assistance  Bathing, Dressing, Feeding Bathing Assistance: Maximum assistance Feeding assistance: Limited assistance Dressing Assistance: Maximum assistance     Functional Limitations Info             SPECIAL CARE FACTORS FREQUENCY                       Contractures Contractures Info: Not present    Additional Factors Info  Code Status, Allergies, Isolation Precautions Code Status Info: DNR Allergies Info: Ceftriaxone, Doxycycline     Isolation Precautions Info: OMDRO, MRSA     Current Medications (07/04/2023):  This is the current hospital active medication list Current Facility-Administered Medications  Medication Dose Route Frequency Provider Last Rate Last Admin   acetaminophen (TYLENOL) tablet 650 mg  650 mg Oral Q6H PRN Alan Mulder, MD   650 mg at 07/03/23 2202   Or   acetaminophen (TYLENOL) suppository 650 mg  650 mg Rectal Q6H PRN Alan Mulder, MD       atenolol (TENORMIN) tablet 12.5 mg  12.5 mg Oral Q12H PRN Dorrell, Robert, MD       baclofen (LIORESAL) tablet 10 mg  10 mg Oral BID Alan Mulder, MD   10 mg at 07/03/23 2149   Chlorhexidine Gluconate Cloth 2 % PADS 6 each  6 each Topical Daily Lynden Oxford  M, MD   6 each at 07/03/23 1000   Chlorhexidine Gluconate Cloth 2 % PADS 6 each  6 each Topical Q0600 Rolly Salter, MD   6 each at 07/04/23 0521   doxazosin (CARDURA) tablet 1 mg  1 mg Oral Daily Dorrell, Robert, MD   1 mg at 07/03/23 1020   mupirocin ointment (BACTROBAN) 2 % 1 Application  1 Application Nasal BID Rolly Salter, MD   1 Application at 07/03/23 2149   ondansetron (ZOFRAN) tablet 4 mg  4 mg Oral Q6H PRN Alan Mulder, MD       Or   ondansetron [facility]) injection 4 mg  4 mg Intravenous Q6H PRN Alan Mulder,  MD       rosuvastatin (CRESTOR) tablet 20 mg  20 mg Oral Daily Alan Mulder, MD   20 mg at 07/03/23 1020   sodium phosphate (FLEET) enema 2 enema  2 enema Rectal Once Jeani Hawking, MD         Discharge Medications: Please see discharge summary for a list of discharge medications.  Relevant Imaging Results:  Relevant Lab Results:   Additional Information SS# [id number]  Mearl Latin, LCSW

## 2023-07-04 NOTE — TOC Progression Note (Signed)
Transition of Care [facility]) - Progression Note    Patient Details  Name: Jason Jones MRN: [id number] Date of Birth: 11/28/1950  [facility]) CM/SW Contact  Mearl Latin, LCSW Phone Number: 07/04/2023, 4:08 PM  Clinical Narrative:    CSW updated Lacinda Axon to expect patient tomorrow.    Expected Discharge Plan: Skilled Nursing Facility Barriers to Discharge: Continued Medical Work up  Expected Discharge Plan and Services     Post Acute Care Choice: Skilled Nursing Facility Living arrangements for the past 2 months: Skilled Nursing Facility                                       Social Determinants of Health (SDOH) Interventions SDOH Screenings   Food Insecurity: No Food Insecurity (07/02/2023)  Housing: Low Risk  (07/02/2023)  Transportation Needs: No Transportation Needs (07/02/2023)  Utilities: Not At Risk (07/02/2023)  Social Connections: Unknown (07/04/2023)  Tobacco Use: High Risk (07/01/2023)    Readmission Risk Interventions    08/05/2022   12:17 PM 07/31/2022   12:11 PM  Readmission Risk Prevention Plan  Transportation Screening Complete Complete  PCP or Specialist Appt within 3-5 Days  Complete  HRI or Home Care Consult  Complete  Social Work Consult for Recovery Care Planning/Counseling  Complete  Palliative Care Screening  Not Applicable  Medication Review Oceanographer) Complete Complete  PCP or Specialist appointment within 3-5 days of discharge Complete   HRI or Home Care Consult Complete   SW Recovery Care/Counseling Consult Complete   Palliative Care Screening Not Applicable   Skilled Nursing Facility Complete

## 2023-07-04 NOTE — Plan of Care (Signed)
Unable to complete Flex sig. Possible DC tomorrow.   Problem: Activity: Goal: Risk for activity intolerance will decrease Outcome: Not Met (add Reason)   Problem: Nutrition: Goal: Adequate nutrition will be maintained Outcome: Not Met (add Reason)   Problem: Coping: Goal: Level of anxiety will decrease Outcome: Not Met (add Reason)   Problem: Elimination: Goal: Will not experience complications related to bowel motility Outcome: Not Met (add Reason) Goal: Will not experience complications related to urinary retention Outcome: Not Met (add Reason)   Problem: Safety: Goal: Ability to remain free from injury will improve Outcome: Not Met (add Reason)   Problem: Pain Managment: Goal: General experience of comfort will improve and/or be controlled Outcome: Not Met (add Reason)   Problem: Skin Integrity: Goal: Risk for impaired skin integrity will decrease Outcome: Not Met (add Reason)

## 2023-07-04 NOTE — Interval H&P Note (Signed)
History and Physical Interval Note:  07/04/2023 1:23 PM  Jason Jones  has presented today for surgery, with the diagnosis of Hematochezia.  The various methods of treatment have been discussed with the patient and family. After consideration of risks, benefits and other options for treatment, the patient has consented to  Procedure(s): FLEXIBLE SIGMOIDOSCOPY (N/A) as a surgical intervention.  The patient's history has been reviewed, patient examined, no change in status, stable for surgery.  I have reviewed the patient's chart and labs.  Questions were answered to the patient's satisfaction.     Savannaha Stonerock D

## 2023-07-05 DIAGNOSIS — K921 Melena: Secondary | ICD-10-CM | POA: Diagnosis not present

## 2023-07-05 MED ORDER — ASPIRIN 81 MG PO TBEC: 81.0000 mg | DELAYED_RELEASE_TABLET | Freq: Every day | ORAL | Status: AC

## 2023-07-05 MED ORDER — POLYETHYLENE GLYCOL 3350 17 G PO PACK
17.0000 g | PACK | Freq: Two times a day (BID) | ORAL | Status: AC | PRN
Start: 2023-07-05 — End: ?

## 2023-07-05 MED ORDER — DOCUSATE SODIUM 100 MG PO CAPS
100.0000 mg | ORAL_CAPSULE | Freq: Two times a day (BID) | ORAL | 0 refills | Status: AC
Start: 2023-07-05 — End: ?

## 2023-07-05 NOTE — TOC Transition Note (Signed)
Transition of Care [facility]) - Discharge Note   Patient Details  Name: Jason Jones MRN: [id number] Date of Birth: [birth year]  Transition of Care [facility]) CM/SW Contact:  Deatra Robinson, [place] Phone Number: 07/05/2023, 12:10 PM   Clinical Narrative:  Pt for dc back to [facility] where he is a LTC resident. Spoke to [place] in admissions who confirmed pt is able to return to room 405. Pt and pt's dtr [name] aware of dc and report agreeable. RN provided with number for report and PTAR arranged for transport. SW signing off at dc.   Dellie Burns, MSW, LCSW [phone] (coverage)      Final next level of care: Skilled Nursing Facility Barriers to Discharge: Barriers Resolved   Patient Goals and CMS Choice Patient states their goals for this hospitalization and ongoing recovery are:: To get back to SNF [facility].gov Compare Post Acute Care list provided to:: Patient Choice offered to / list presented to : Patient [place] ownership interest in [facility].provided to:: Patient    Discharge Placement              Patient chooses bed at: [facility] Patient to be transferred to facility by: [facility] Name of family member notified: [name]/dtr Patient and family notified of of transfer: 07/05/23  Discharge Plan and Services Additional resources added to the After Visit Summary for       Post Acute Care Choice: Skilled Nursing Facility                               Social Drivers of Health (SDOH) Interventions SDOH Screenings   Food Insecurity: No Food Insecurity (07/02/2023)  Housing: Low Risk  (07/02/2023)  Transportation Needs: No Transportation Needs (07/02/2023)  Utilities: Not At Risk (07/02/2023)  Social Connections: Unknown (07/04/2023)  Tobacco Use: High Risk (07/01/2023)     Readmission Risk Interventions    08/05/2022   12:17 PM 07/31/2022   12:11 PM  Readmission Risk Prevention Plan  Transportation Screening Complete Complete   PCP or Specialist Appt within 3-5 Days  Complete  HRI or Home Care Consult  Complete  Social Work Consult for Recovery Care Planning/Counseling  Complete  Palliative Care Screening  Not Applicable  Medication Review Oceanographer) Complete Complete  PCP or Specialist appointment within 3-5 days of discharge Complete   HRI or Home Care Consult Complete   SW Recovery Care/Counseling Consult Complete   Palliative Care Screening Not Applicable   Skilled Nursing Facility Complete

## 2023-07-05 NOTE — Discharge Summary (Addendum)
Physician Discharge Summary   Patient: Jason Jones MRN: [id number] DOB: 26-Jun-1950  Admit date:     07/01/2023  Discharge date: 07/05/23  Discharge Physician: Lynden Oxford  PCP: [name], NP  Recommendations at discharge:  Follow up with Gastroenterology as recommended  Repeat CBC in 1 week   Contact information for follow-up providers     [name], NP. Schedule an appointment as soon as possible for a visit in 1 week(s).   Specialty: Nurse Practitioner Why: with CBC lab to look at blood counts Contact information: [address] [place] [address] [phone]         Jeani Hawking, MD. Go to.   Specialty: Gastroenterology Why: the Appointment As Scheduled Contact information: [address] [place] [address] [phone]         [facility] ENDOSCOPY. Go on 07/25/2023.   Why: the Appointment As Scheduled,  Date: 07/25/2023 Time: 0930 Location: [facility] ENDOSCOPY, Surgery FLEXIBLE SIGMOIDOSCOPY Contact information: [address] [place] [address] [phone]             Contact information for after-discharge care     Destination     [facility] .   Service: Skilled Nursing Contact information: [address] [place] [address] [phone]                    Discharge Diagnoses: Principal Problem:   Hematochezia Active Problems:   Prostate cancer [facility])   Primary hypertension   Dysphagia due to recent cerebrovascular accident (CVA)   BPH with urinary obstruction   Chronic hepatitis C [facility])  Hospital Course: Waverly Chavarria is a 73 y.o. male with medical history significant for prostate cancer, BPH, chronic Foley catheter, and history of CVA presented from [facility] with hematochezia. GI consulted. Currently plan for flexible sigmoidoscopy with APC. Assessment and Plan: Hematochezia. Acute blood loss anemia Appreciate GI  consultation. Scheduled for flexible sigmoidoscopy. 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  On 1/15 Gastroenterology attempted to perform a Flex sig to treat his radiation proctitis. It did not appear that he had a prep. He had solid stool that prevents adequate visualization and the presence of stool makes it a high risk for a colonic explosion. Unable to reattempt in hospital in short term due to endoscopy being backed up with procedures. Since Hb is stable and no active bleeding seen here,. Gastroenterology recommended to have him undergo the procedure as an outpatient versus waiting until Monday.  HTN. Hypotension Blood was low postprocedure. Will discontinue doxazosin as there is no indication to continue this medication when he has chronic Foley catheter. BP improved.    Prior history of CVA with left-sided weakness. On aspirin was on hold due to hematochezia. Will resume.    HLD. Continue Crestor.   PAD. On aspirin. held due to hematochezia. Resumed  Consultants:  Gastroenterology D Elnoria Howard  Procedures performed:  Attempted Flexible sigmoidoscopy  DISCHARGE MEDICATION: Allergies as of 07/05/2023       Reactions   Ceftriaxone Swelling, Other (See Comments)   Possible lips swelling (dose 07/21/22) "Allergic," per facility   Doxycycline Other (See Comments)   "ALLERGIC," per facility document        Medication List     STOP taking these medications    aspirin 81 MG chewable tablet Replaced by: aspirin EC 81 MG tablet   doxazosin 1 MG tablet Commonly known as: CARDURA   ferrous sulfate 325 (65 FE) MG EC  tablet       TAKE these medications    acetaminophen 500 MG tablet Commonly known as: TYLENOL Take 2 tablets (1,000 mg total) by mouth every 8 (eight) hours as needed for mild pain, headache or moderate pain. What changed:  when to take this Another medication with the same name was removed. Continue taking this medication, and follow the directions  you see here.   albuterol (2.5 MG/3ML) 0.083% nebulizer solution Commonly known as: PROVENTIL Take 3 mLs (2.5 mg total) by nebulization every 4 (four) hours as needed for wheezing or shortness of breath.   AQUAPHOR OINTMENT BODY EX Apply 1 application  topically See admin instructions. Apply to the lower legs and feet every shift   aspirin EC 81 MG tablet Take 1 tablet (81 mg total) by mouth daily. Swallow whole. Replaces: aspirin 81 MG chewable tablet   atenolol 25 MG tablet Commonly known as: TENORMIN Take 12.5 mg by mouth every 12 (twelve) hours as needed (BP>130).   b complex vitamins capsule Take 1 capsule by mouth in the morning.   Baclofen 5 MG Tabs Take 7.5 mg by mouth in the morning and at bedtime. What changed: Another medication with the same name was removed. Continue taking this medication, and follow the directions you see here.   docusate sodium 100 MG capsule Commonly known as: Colace Take 1 capsule (100 mg total) by mouth 2 (two) times daily.   ferrous gluconate 324 MG tablet Commonly known as: FERGON Take 1 tablet (324 mg total) by mouth daily with breakfast.   melatonin 5 MG Tabs Take 5 mg by mouth at bedtime.   NON FORMULARY Take 120 mLs by mouth See admin instructions. MedPass 2.0 - Drink 120 ml's by mouth three times a day   omeprazole 20 MG capsule Commonly known as: PRILOSEC Take 20 mg by mouth at bedtime.   polyethylene glycol 17 g packet Commonly known as: MIRALAX / GLYCOLAX Take 17 g by mouth daily as needed. What changed: reasons to take this   polyethylene glycol 17 g packet Commonly known as: MiraLax Take 17 g by mouth 2 (two) times daily as needed (bowel prep for sigmoidoscopy, start 2/22). What changed: You were already taking a medication with the same name, and this prescription was added. Make sure you understand how and when to take each.   PRO-STAT 64 PO Take 30 mLs by mouth in the morning and at bedtime.   rosuvastatin 20 MG  tablet Commonly known as: CRESTOR Take 1 tablet (20 mg total) by mouth daily.   senna-docusate 8.6-50 MG tablet Commonly known as: Senokot-S Take 2 tablets by mouth at bedtime.   sodium chloride 0.65 % Soln nasal spray Commonly known as: OCEAN Place 1 spray into both nostrils as needed for congestion.       Disposition: SNF Diet recommendation: Cardiac diet  Discharge Exam: Vitals:   07/04/23 1942 07/04/23 2322 07/05/23 0314 07/05/23 0834  BP: 113/72 (!) 144/118 94/71 93/60  Pulse: (!) 111 (!) 107 89 88  Resp: 18 18 18 18   Temp: (!) 97.4 F (36.3 C) 99.7 F (37.6 C) (!) 97.3 F (36.3 C) 98 F (36.7 C)  TempSrc: Oral Oral Oral   SpO2: 95% 95% 94% 100%  Weight:      Height:       General: Appear in no distress; no visible Abnormal Neck Mass Or lumps, Conjunctiva normal Cardiovascular: S1 and S2 Present, no Murmur, Respiratory: good respiratory effort, Bilateral Air entry  present and CTA, no Crackles, no wheezes Abdomen: Bowel Sound present, Non tender  Extremities: no Pedal edema Neurology: alert and oriented to time, place, and person chronic left contracture.  Filed Weights   07/04/23 1252  Weight: 57 kg   Condition at discharge: stable  The results of significant diagnostics from this hospitalization (including imaging, microbiology, ancillary and laboratory) are listed below for reference.   Imaging Studies: No results found.  Microbiology: Results for orders placed or performed during the hospital encounter of 07/01/23  MRSA Next Gen by PCR, Nasal     Status: Abnormal   Collection Time: 07/02/23  1:08 PM   Specimen: Nasal Mucosa; Nasal Swab  Result Value Ref Range Status   MRSA by PCR Next Gen DETECTED (A) NOT DETECTED Final    Comment: RESULT CALLED TO, READ BACK BY AND VERIFIED WITH: RN TIANDRA BARNES ON 07/02/23 @ 1824 BY DRT (NOTE) The GeneXpert MRSA Assay (FDA approved for NASAL specimens only), is one component of a comprehensive MRSA colonization  surveillance program. It is not intended to diagnose MRSA infection nor to guide or monitor treatment for MRSA infections. Test performance is not FDA approved in patients less than 73 years old. Performed at [facility], [address], [place], [place] [address]    Labs: CBC: Recent Labs  Lab 07/02/23 1145 07/02/23 1753 07/03/23 0505 07/03/23 1055 07/03/23 1819  WBC 6.6 8.6 6.0 5.6 6.9  HGB 10.0* 9.6* 9.3* 9.7* 10.2*  HCT 31.6* 30.0* 29.6* 31.0* 32.2*  MCV 91.6 90.6 91.1 90.9 91.2  PLT 327 311 312 324 330   Basic Metabolic Panel: Recent Labs  Lab 07/01/23 2207  NA 141  K 4.1  CL 106  CO2 24  GLUCOSE 113*  BUN 21  CREATININE 0.94  CALCIUM 9.1   Liver Function Tests: No results for input(s): "AST", "ALT", "ALKPHOS", "BILITOT", "PROT", "ALBUMIN" in the last 168 hours. CBG: No results for input(s): "GLUCAP" in the last 168 hours.  Discharge time spent: greater than 30 minutes.  Author: Lynden Oxford, MD  [name]

## 2023-07-05 NOTE — Plan of Care (Signed)

## 2023-07-07 ENCOUNTER — Encounter (HOSPITAL_COMMUNITY): Payer: Self-pay | Admitting: Gastroenterology

## 2023-07-07 NOTE — Anesthesia Postprocedure Evaluation (Signed)
Anesthesia Post Note  Patient: Jason Jones  Procedure(s) Performed: FLEXIBLE SIGMOIDOSCOPY     Patient location during evaluation: PACU Anesthesia Type: MAC Level of consciousness: awake and alert Pain management: pain level controlled Vital Signs Assessment: post-procedure vital signs reviewed and stable Respiratory status: spontaneous breathing, nonlabored ventilation, respiratory function stable and patient connected to nasal cannula oxygen Cardiovascular status: stable and blood pressure returned to baseline Postop Assessment: no apparent nausea or vomiting Anesthetic complications: no   No notable events documented.  Last Vitals:  Vitals:   07/05/23 0314 07/05/23 0834  BP: 94/71 93/60  Pulse: 89 88  Resp: 18 18  Temp: (!) 36.3 C 36.7 C  SpO2: 94% 100%    Last Pain:  Vitals:   07/05/23 0900  TempSrc:   PainSc: 0-No pain                 Kennieth Rad

## 2023-07-11 NOTE — Op Note (Signed)
[facility] Patient Name: Jason Jones Procedure Date : 07/04/2023 MRN: [id number] Attending MD: Jeani Hawking , MD, [id number] Date of Birth: December 24, 1950 CSN: [id number] Age: 73 Admit Type: Inpatient Procedure:                Flexible Sigmoidoscopy Indications:              Hematochezia Providers:                Jeani Hawking, MD, Jacquelyn "Jaci" Clelia Croft, RN,                            Sunday Corn Mbumina, Technician Referring MD:              Medicines:                Propofol per Anesthesia Complications:            No immediate complications. Estimated Blood Loss:     Estimated blood loss: none. Procedure:                Pre-Anesthesia Assessment:                           - Prior to the procedure, a History and Physical                            was performed, and patient medications and                            allergies were reviewed. The patient's tolerance of                            previous anesthesia was also reviewed. The risks                            and benefits of the procedure and the sedation                            options and risks were discussed with the patient.                            All questions were answered, and informed consent                            was obtained. Prior Anticoagulants: The patient has                            taken no anticoagulant or antiplatelet agents. ASA                            Grade Assessment: III - A patient with severe                            systemic disease. After reviewing the risks and  benefits, the patient was deemed in satisfactory                            condition to undergo the procedure.                           - Sedation was administered by an anesthesia                            professional. Deep sedation was attained.                           After obtaining informed consent, the scope was                            passed under direct vision. The  [id number]-H190 ([id number])                            Olympus endoscope was introduced through the anus                            and advanced to the the rectum. The flexible                            sigmoidoscopy was performed with difficulty due to                            poor bowel prep. The patient tolerated the                            procedure well. The quality of the bowel                            preparation was poor. Scope In: 2:07:42 PM Scope Out: 2:12:39 PM Total Procedure Duration: 0 hours 4 minutes 57 seconds  Findings:      Multiple medium-sized patchy angioectasias with stigmata of recent       bleeding were found in the rectum. Impression:               - Preparation of the colon was poor.                           - Multiple recently bleeding colonic angioectasias.                           - No specimens collected. Recommendation:           - Arrange for outpatient treatment. An oral colonic                            prep will be used. Procedure Code(s):        --- Professional ---                           [phone], 52, Sigmoidoscopy, flexible; diagnostic,  including collection of specimen(s) by brushing or                            washing, when performed (separate procedure) Diagnosis Code(s):        --- Professional ---                           K55.21, Angiodysplasia of colon with hemorrhage                           K92.1, Melena (includes Hematochezia) CPT copyright 2022 American Medical Association. All rights reserved. The codes documented in this report are preliminary and upon coder review may  be revised to meet current compliance requirements. Jeani Hawking, MD Jeani Hawking, MD 07/11/2023 8:30:21 AM This report has been signed electronically. Number of Addenda: 0

## 2023-07-18 ENCOUNTER — Encounter (HOSPITAL_COMMUNITY): Payer: Self-pay | Admitting: Gastroenterology

## 2023-07-24 ENCOUNTER — Other Ambulatory Visit: Payer: Self-pay

## 2023-07-24 ENCOUNTER — Emergency Department (HOSPITAL_COMMUNITY)
Admission: EM | Admit: 2023-07-24 | Discharge: 2023-07-24 | Disposition: A | Discharge status: Home / Self Care | Payer: 59 | Attending: Emergency Medicine | Admitting: Emergency Medicine

## 2023-07-24 DIAGNOSIS — T83021A Displacement of indwelling urethral catheter, initial encounter: Secondary | ICD-10-CM | POA: Insufficient documentation

## 2023-07-24 DIAGNOSIS — Y738 Miscellaneous gastroenterology and urology devices associated with adverse incidents, not elsewhere classified: Secondary | ICD-10-CM | POA: Insufficient documentation

## 2023-07-24 MED ORDER — LIDOCAINE HCL URETHRAL/MUCOSAL 2 % EX GEL
1.0000 | Freq: Once | CUTANEOUS | Status: AC
  Administered 2023-07-24: 1 via URETHRAL
  Filled 2023-07-24: qty 11

## 2023-07-24 NOTE — ED Triage Notes (Signed)
Pt BIB [facility] coming from [facility] and [facility]. States that his catheter came out and the nurse in [facility] was unable to insert a new foley in. States he had some bleeding with the attempts. Denies any pain at this time but hurts when he tries urinating.  BP 110/56, HR 78, CBG 126

## 2023-07-24 NOTE — ED Notes (Signed)
[facility] contacted for transport back to [facility]

## 2023-07-24 NOTE — ED Provider Notes (Signed)
[place] EMERGENCY DEPARTMENT AT [facility] Provider Note   CSN: [id number] Arrival date & time: 07/24/23  0455     History  Chief Complaint  Patient presents with   Catheter Issue    Jason Jones is a 73 y.o. male.  The history is provided by the patient.  Jason Jones is a 73 y.o. male who presents to the Emergency Department complaining of Foley catheter problem.  He presents to the emergency department by EMS from [facility] and rehab after his Foley catheter accidentally got pulled out.  He states that the nurse at the facility tried 3 separate times to get the catheter in place and he started bleeding and he told her to stop.  He reports recently being admitted to the hospital for rectal bleeding and is scheduled to have a follow-up colonoscopy tomorrow.  No fevers, abdominal pain, dysuria.  He is not on anticoagulation at this time.  He has a chronic indwelling Foley catheter for BPH.     Home Medications Prior to Admission medications   Medication Sig Start Date End Date Taking? Authorizing Provider  acetaminophen (TYLENOL) 500 MG tablet Take 2 tablets (1,000 mg total) by mouth every 8 (eight) hours as needed for mild pain, headache or moderate pain. Patient taking differently: Take 1,000 mg by mouth in the morning and at bedtime. 08/05/22   Alwyn Ren, MD  albuterol (PROVENTIL) (2.5 MG/3ML) 0.083% nebulizer solution Take 3 mLs (2.5 mg total) by nebulization every 4 (four) hours as needed for wheezing or shortness of breath. 03/28/22   Marrianne Mood, MD  Amino Acids-Protein Hydrolys (PRO-STAT 64 PO) Take 30 mLs by mouth in the morning and at bedtime.    [provider]  aspirin EC 81 MG tablet Take 1 tablet (81 mg total) by mouth daily. Swallow whole. 07/05/23 07/04/24  Rolly Salter, MD  atenolol (TENORMIN) 25 MG tablet Take 12.5 mg by mouth every 12 (twelve) hours as needed (BP>130).    [provider]  b complex  vitamins capsule Take 1 capsule by mouth in the morning.    [provider]  Baclofen 5 MG TABS Take 7.5 mg by mouth in the morning and at bedtime.    [provider]  docusate sodium (COLACE) 100 MG capsule Take 1 capsule (100 mg total) by mouth 2 (two) times daily. 07/05/23   Rolly Salter, MD  Emollient (AQUAPHOR OINTMENT BODY EX) Apply 1 application  topically See admin instructions. Apply to the lower legs and feet every shift    [provider]  ferrous gluconate (FERGON) 324 MG tablet Take 1 tablet (324 mg total) by mouth daily with breakfast. 12/26/22 06/17/23  Rodolph Bong, MD  melatonin 5 MG TABS Take 5 mg by mouth at bedtime.    [provider]  NON FORMULARY Take 120 mLs by mouth See admin instructions. MedPass 2.0 - Drink 120 ml's by mouth three times a day    [provider]  omeprazole (PRILOSEC) 20 MG capsule Take 20 mg by mouth at bedtime.    [provider]  polyethylene glycol (MIRALAX / GLYCOLAX) 17 g packet Take 17 g by mouth daily as needed. Patient taking differently: Take 17 g by mouth daily as needed (for constipation). 04/16/22   Kathlen Mody, MD  polyethylene glycol (MIRALAX) 17 g packet Take 17 g by mouth 2 (two) times daily as needed (bowel prep for sigmoidoscopy, start 2/22). 07/05/23   Rolly Salter,  MD  rosuvastatin (CRESTOR) 20 MG tablet Take 1 tablet (20 mg total) by mouth daily. 03/29/22   Marrianne Mood, MD  senna-docusate (SENOKOT-S) 8.6-50 MG tablet Take 2 tablets by mouth at bedtime.    [provider]  sodium chloride (OCEAN) 0.65 % SOLN nasal spray Place 1 spray into both nostrils as needed for congestion. 04/16/22   Kathlen Mody, MD      Allergies    Ceftriaxone and Doxycycline    Review of Systems   Review of Systems  All other systems reviewed and are negative.   Physical Exam Updated Vital Signs BP 117/71 (BP Location: Right Arm)   Pulse 78   Temp 98 F (36.7 C) (Oral)    Resp 18   Ht 5\' 11"  (1.803 m)   Wt 59.9 kg   SpO2 92%   BMI 18.41 kg/m  Physical Exam Vitals and nursing note reviewed.  Constitutional:      Appearance: He is well-developed.  HENT:     Head: Normocephalic and atraumatic.  Cardiovascular:     Rate and Rhythm: Normal rate and regular rhythm.  Pulmonary:     Effort: Pulmonary effort is normal. No respiratory distress.  Abdominal:     Palpations: Abdomen is soft.     Tenderness: There is no abdominal tenderness. There is no guarding or rebound.  Genitourinary:    Comments: Uncircumcised penis with phimosis.  Glans/meatus without any active bleeding Musculoskeletal:        General: No tenderness.  Skin:    General: Skin is warm and dry.  Neurological:     Mental Status: He is alert and oriented to person, place, and time.     Comments: Left hemiparesis  Psychiatric:        Behavior: Behavior normal.     ED Results / Procedures / Treatments   Labs (all labs ordered are listed, but only abnormal results are displayed) Labs Reviewed - No data to display  EKG None  Radiology No results found.  Procedures Procedures    Medications Ordered in ED Medications  lidocaine (XYLOCAINE) 2 % jelly 1 Application (1 Application Urethral Given 07/24/23 1027)    ED Course/ Medical Decision Making/ A&P                                 Medical Decision Making  Patient brought in for Foley catheter dislodgment.  This occurred just prior to ED arrival.  Nursing replaced Foley catheter without difficulty with a 16 French catheter.  Patient without any complaints on reassessment.  Patient's daughter updated over the phone.  Plan to discharge back to facility with outpatient follow-up and return precautions.        Final Clinical Impression(s) / ED Diagnoses Final diagnoses:  Displacement of Foley catheter, initial encounter [facility])    Rx / DC Orders ED Discharge Orders     None         Tilden Fossa, MD 07/24/23  [phone]

## 2023-07-25 ENCOUNTER — Other Ambulatory Visit: Payer: Self-pay

## 2023-07-25 ENCOUNTER — Encounter (HOSPITAL_COMMUNITY)
Admission: RE | Disposition: A | Discharge status: Home / Self Care | Payer: Self-pay | Source: Home / Self Care | Attending: Gastroenterology

## 2023-07-25 ENCOUNTER — Encounter (HOSPITAL_COMMUNITY): Payer: Self-pay

## 2023-07-25 ENCOUNTER — Ambulatory Visit (HOSPITAL_COMMUNITY): Payer: 59

## 2023-07-25 ENCOUNTER — Ambulatory Visit (HOSPITAL_COMMUNITY): Admit: 2023-07-25 | Payer: 59 | Admitting: Gastroenterology

## 2023-07-25 ENCOUNTER — Ambulatory Visit (HOSPITAL_COMMUNITY)
Admission: RE | Admit: 2023-07-25 | Discharge: 2023-07-25 | Disposition: A | Discharge status: Home / Self Care | Payer: 59 | Attending: Gastroenterology | Admitting: Gastroenterology

## 2023-07-25 DIAGNOSIS — Y842 Radiological procedure and radiotherapy as the cause of abnormal reaction of the patient, or of later complication, without mention of misadventure at the time of the procedure: Secondary | ICD-10-CM | POA: Insufficient documentation

## 2023-07-25 DIAGNOSIS — Z8673 Personal history of transient ischemic attack (TIA), and cerebral infarction without residual deficits: Secondary | ICD-10-CM | POA: Diagnosis not present

## 2023-07-25 DIAGNOSIS — F17210 Nicotine dependence, cigarettes, uncomplicated: Secondary | ICD-10-CM

## 2023-07-25 DIAGNOSIS — I129 Hypertensive chronic kidney disease with stage 1 through stage 4 chronic kidney disease, or unspecified chronic kidney disease: Secondary | ICD-10-CM | POA: Diagnosis not present

## 2023-07-25 DIAGNOSIS — N1831 Chronic kidney disease, stage 3a: Secondary | ICD-10-CM | POA: Diagnosis not present

## 2023-07-25 DIAGNOSIS — K627 Radiation proctitis: Secondary | ICD-10-CM | POA: Insufficient documentation

## 2023-07-25 DIAGNOSIS — I10 Essential (primary) hypertension: Secondary | ICD-10-CM | POA: Diagnosis not present

## 2023-07-25 DIAGNOSIS — K5521 Angiodysplasia of colon with hemorrhage: Secondary | ICD-10-CM | POA: Diagnosis not present

## 2023-07-25 DIAGNOSIS — J45909 Unspecified asthma, uncomplicated: Secondary | ICD-10-CM | POA: Diagnosis not present

## 2023-07-25 DIAGNOSIS — K921 Melena: Secondary | ICD-10-CM | POA: Diagnosis present

## 2023-07-25 DIAGNOSIS — K219 Gastro-esophageal reflux disease without esophagitis: Secondary | ICD-10-CM | POA: Diagnosis not present

## 2023-07-25 DIAGNOSIS — K5520 Angiodysplasia of colon without hemorrhage: Secondary | ICD-10-CM

## 2023-07-25 HISTORY — PX: HOT HEMOSTASIS: SHX5433

## 2023-07-25 HISTORY — PX: FLEXIBLE SIGMOIDOSCOPY: SHX5431

## 2023-07-25 SURGERY — SIGMOIDOSCOPY, FLEXIBLE
Anesthesia: Monitor Anesthesia Care

## 2023-07-25 MED ORDER — PROPOFOL 10 MG/ML IV BOLUS
INTRAVENOUS | Status: DC | PRN
  Administered 2023-07-25: 20 mg via INTRAVENOUS

## 2023-07-25 MED ORDER — LIDOCAINE HCL (CARDIAC) PF 100 MG/5ML IV SOSY
PREFILLED_SYRINGE | INTRAVENOUS | Status: DC | PRN
  Administered 2023-07-25: 40 mg via INTRAVENOUS

## 2023-07-25 MED ORDER — SODIUM CHLORIDE 0.9 % IV SOLN: INTRAVENOUS | Status: DC | PRN

## 2023-07-25 MED ORDER — PROPOFOL 500 MG/50ML IV EMUL
INTRAVENOUS | Status: DC | PRN
  Administered 2023-07-25: 75 ug/kg/min via INTRAVENOUS

## 2023-07-25 MED ORDER — VASOPRESSIN 20 UNIT/ML IV SOLN
INTRAVENOUS | Status: DC | PRN
Start: 2023-07-25 — End: 2023-07-25
  Administered 2023-07-25 (×4): 1 [IU] via INTRAVENOUS

## 2023-07-25 NOTE — Anesthesia Preprocedure Evaluation (Signed)
Anesthesia Evaluation  Patient identified by MRN, date of birth, ID band Patient awake    Reviewed: Allergy & Precautions, NPO status , Patient's Chart, lab work & pertinent test results  Airway Mallampati: II  TM Distance: >3 FB     Dental  (+) Dental Advisory Given   Pulmonary asthma , Current Smoker   breath sounds clear to auscultation       Cardiovascular hypertension,  Rhythm:Regular Rate:Normal     Neuro/Psych  Neuromuscular disease CVA    GI/Hepatic ,GERD  ,,(+) Hepatitis -, Crectal bleeding    Endo/Other  negative endocrine ROS    Renal/GU CRFRenal disease     Musculoskeletal  (+) Arthritis ,    Abdominal   Peds  Hematology  (+) Blood dyscrasia, anemia   Anesthesia Other Findings   Reproductive/Obstetrics                              Anesthesia Physical Anesthesia Plan  ASA: 4  Anesthesia Plan: MAC   Post-op Pain Management: Minimal or no pain anticipated   Induction:   PONV Risk Score and Plan: 0 and Propofol infusion  Airway Management Planned: Natural Airway and Simple Face Mask  Additional Equipment: None  Intra-op Plan:   Post-operative Plan:   Informed Consent: I have reviewed the patients History and Physical, chart, labs and discussed the procedure including the risks, benefits and alternatives for the proposed anesthesia with the patient or authorized representative who has indicated his/her understanding and acceptance.   Patient has DNR.  Discussed DNR with patient and Suspend DNR.     Plan Discussed with:   Anesthesia Plan Comments:         Anesthesia Quick Evaluation

## 2023-07-25 NOTE — H&P (Signed)
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  He was recently admitted for hematochezia and anemia.  During his hospitalization he was not properly prepped.  Past Medical History:  Diagnosis Date   Acute ischemic right PCA stroke (HCC) 03/25/2022   Arthritis    Asthma    Cancer (HCC)    CAP (community acquired pneumonia) 07/20/2022   Chronic alcohol abuse    Dyspnea    GERD (gastroesophageal reflux disease)    PRN  ---  TAKES BAKING SODA IN WATER   History of pneumothorax    04-28-2006  fell, left fx rib--  resolved w/ chest tube   Hypertension    Nocturia    Poor dental hygiene    Right inguinal hernia    Weak urinary stream     Past Surgical History:  Procedure Laterality Date   COLONOSCOPY  01/08/2021   FLEXIBLE SIGMOIDOSCOPY N/A 07/04/2023   Procedure: FLEXIBLE SIGMOIDOSCOPY;  Surgeon: Jeani Hawking, MD;  Location: [facility] ENDOSCOPY;  Service: Gastroenterology;  Laterality: N/A;   GOLD SEED IMPLANT N/A 05/23/2021   Procedure: GOLD SEED IMPLANT;  Surgeon: Rene Paci, MD;  Location: [facility] ORS;  Service: Urology;  Laterality: N/A;   INGUINAL HERNIA REPAIR Right 04/23/2017   Procedure: OPEN RIGHT INGUINAL HERNIA REPAIR WITH MESH;  Surgeon: Kinsinger, De Blanch, MD;  Location: [facility];  Service: General;  Laterality: Right;  GENERAL COMBINED WITH REGIONAL FOR POST OP PAIN    INSERTION OF MESH Right 04/23/2017   Procedure: INSERTION OF MESH;  Surgeon: Kinsinger, De Blanch, MD;  Location: [facility];  Service: General;  Laterality: Right;  GENERAL COMBINED WITH REGIONAL FOR POST OP PAIN    LIPOMA EXCISION Right 02/22/2015   Procedure: RIGHT SHOULDER MASS EXCISION 15 CM SQ;  Surgeon: Rodman Pickle, MD;  Location: [facility];  Service: General;  Laterality: Right;   SPACE OAR INSTILLATION N/A 05/23/2021   Procedure: SPACE OAR INSTILLATION;  Surgeon: Rene Paci, MD;   Location: [facility];  Service: Urology;  Laterality: N/A;   TRANSRECTAL ULTRASOUND N/A 05/23/2021   Procedure: TRANSRECTAL ULTRASOUND;  Surgeon: Rene Paci, MD;  Location: [facility] ORS;  Service: Urology;  Laterality: N/A;    Family History  Problem Relation Age of Onset   Prostate cancer Cousin    Prostate cancer Cousin     Social History:  reports that he has been smoking cigarettes. He has a 25 pack-year smoking history. He has never used smokeless tobacco. He reports that he does not currently use alcohol. He reports that he does not currently use drugs after having used the following drugs: Marijuana.  Allergies:  Allergies  Allergen Reactions   Ceftriaxone Swelling and Other (See Comments)    Possible lips swelling (dose 07/21/22) "Allergic," per facility   Doxycycline Other (See Comments)    "ALLERGIC," per facility document    Medications: Scheduled: Continuous:  No results found for this or any previous visit (from the past 24 hours).   No results found.  ROS:  As stated above in the HPI otherwise negative.  Blood pressure 132/87, pulse 90, temperature 98.7 F (37.1 C), temperature source Temporal, resp. rate 17, height 5\' 11"  (1.803 m), SpO2 94%.    PE: Gen: NAD, Alert and Oriented HEENT:  [place]/AT, EOMI Neck: Supple, no LAD Lungs: CTA Bilaterally CV: RRR without M/G/R ABD: Soft, NTND, +BS Ext: No C/C/E  Assessment/Plan: 1) Radiation proctitis - FFS with  APCJeani Hawking D 07/25/2023, 9:11 AM

## 2023-07-25 NOTE — Op Note (Signed)
[facility] Patient Name: Jason Jones Procedure Date: 07/25/2023 MRN: [id number] Attending MD: Jeani Hawking , MD, [id number] Date of Birth: March 26, 1951 CSN: [id number] Age: 73 Admit Type: Outpatient Procedure:                Flexible Sigmoidoscopy Indications:              Hematochezia Providers:                Jeani Hawking, MD, Fransisca Connors, Rhodia Albright,                            Technician Referring MD:              Medicines:                Propofol per Anesthesia Complications:            No immediate complications. Estimated Blood Loss:     Estimated blood loss: none. Procedure:                Pre-Anesthesia Assessment:                           - Prior to the procedure, a History and Physical                            was performed, and patient medications and                            allergies were reviewed. The patient's tolerance of                            previous anesthesia was also reviewed. The risks                            and benefits of the procedure and the sedation                            options and risks were discussed with the patient.                            All questions were answered, and informed consent                            was obtained. Prior Anticoagulants: The patient has                            taken no anticoagulant or antiplatelet agents. ASA                            Grade Assessment: III - A patient with severe                            systemic disease. After reviewing the risks and                            benefits, the patient  was deemed in satisfactory                            condition to undergo the procedure.                           - Sedation was administered by an anesthesia                            professional. Deep sedation was attained.                           After obtaining informed consent, the scope was                            passed under direct vision. The GIF-H190  ([id number]                            Olympus endoscope was introduced through the anus                            and advanced to the the sigmoid colon. The flexible                            sigmoidoscopy was accomplished without difficulty.                            The patient tolerated the procedure well. The                            quality of the bowel preparation was good. Scope In: 10:40:49 AM Scope Out: 10:49:42 AM Total Procedure Duration: 0 hours 8 minutes 53 seconds  Findings:      Multiple medium-sized patchy angiodysplastic lesions without bleeding       were found in the rectum. Coagulation for tissue destruction using       monopolar probe was successful. Impression:               - Multiple non-bleeding colonic angiodysplastic                            lesions. Treated with a monopolar probe.                           - No specimens collected. Moderate Sedation:      Not Applicable - Patient had care per Anesthesia. Recommendation:           - Patient has a contact number available for                            emergencies. The signs and symptoms of potential                            delayed complications were discussed with the  patient. Return to normal activities tomorrow.                            Written discharge instructions were provided to the                            patient.                           - Resume previous diet. Procedure Code(s):        --- Professional ---                           [phone], Sigmoidoscopy, flexible; with ablation of                            tumor(s), polyp(s), or other lesion(s) (includes                            pre- and post-dilation and guide wire passage, when                            performed) Diagnosis Code(s):        --- Professional ---                           K55.20, Angiodysplasia of colon without hemorrhage                           K92.1, Melena (includes  Hematochezia) CPT copyright 2022 American Medical Association. All rights reserved. The codes documented in this report are preliminary and upon coder review may  be revised to meet current compliance requirements. Jeani Hawking, MD Jeani Hawking, MD 07/25/2023 10:54:22 AM This report has been signed electronically. Number of Addenda: 0

## 2023-07-25 NOTE — Discharge Instructions (Signed)

## 2023-07-25 NOTE — Transfer of Care (Signed)
Immediate Anesthesia Transfer of Care Note  Patient: Jason Jones  Procedure(s) Performed: FLEXIBLE SIGMOIDOSCOPY HOT HEMOSTASIS (ARGON PLASMA COAGULATION/BICAP)  Patient Location: PACU  Anesthesia Type:MAC  Level of Consciousness: awake and alert   Airway & Oxygen Therapy: Patient Spontanous Breathing and Patient connected to nasal cannula oxygen  Post-op Assessment: Report given to RN and Post -op Vital signs reviewed and stable  Post vital signs: Reviewed and stable  Last Vitals:  Vitals Value Taken Time  BP    Temp    Pulse    Resp    SpO2      Last Pain:  Vitals:   07/25/23 0850  TempSrc: Temporal         Complications: No notable events documented.

## 2023-07-25 NOTE — Anesthesia Postprocedure Evaluation (Signed)
Anesthesia Post Note  Patient: Shariff Lasky  Procedure(s) Performed: FLEXIBLE SIGMOIDOSCOPY HOT HEMOSTASIS (ARGON PLASMA COAGULATION/BICAP)     Patient location during evaluation: PACU Anesthesia Type: MAC Level of consciousness: awake and alert Pain management: pain level controlled Vital Signs Assessment: post-procedure vital signs reviewed and stable Respiratory status: spontaneous breathing, nonlabored ventilation, respiratory function stable and patient connected to nasal cannula oxygen Cardiovascular status: stable and blood pressure returned to baseline Postop Assessment: no apparent nausea or vomiting Anesthetic complications: no   No notable events documented.  Last Vitals:  Vitals:   07/25/23 1130 07/25/23 1135  BP: (!) 100/56 107/72  Pulse: 72 82  Resp: 14 14  Temp:    SpO2: 92% 94%    Last Pain:  Vitals:   07/25/23 1135  TempSrc:   PainSc: 0-No pain                 [place]

## 2023-07-28 ENCOUNTER — Encounter (HOSPITAL_COMMUNITY): Payer: Self-pay | Admitting: Gastroenterology

## 2023-08-07 ENCOUNTER — Encounter (HOSPITAL_COMMUNITY): Payer: Self-pay | Admitting: Emergency Medicine

## 2023-08-07 ENCOUNTER — Other Ambulatory Visit: Payer: Self-pay

## 2023-08-07 ENCOUNTER — Emergency Department (HOSPITAL_COMMUNITY)
Admission: EM | Admit: 2023-08-07 | Discharge: 2023-08-08 | Disposition: A | Discharge status: Home / Self Care | Payer: 59 | Attending: Emergency Medicine | Admitting: Emergency Medicine

## 2023-08-07 DIAGNOSIS — Z7982 Long term (current) use of aspirin: Secondary | ICD-10-CM | POA: Diagnosis not present

## 2023-08-07 DIAGNOSIS — Z8673 Personal history of transient ischemic attack (TIA), and cerebral infarction without residual deficits: Secondary | ICD-10-CM | POA: Insufficient documentation

## 2023-08-07 DIAGNOSIS — R338 Other retention of urine: Secondary | ICD-10-CM | POA: Insufficient documentation

## 2023-08-07 DIAGNOSIS — R300 Dysuria: Secondary | ICD-10-CM | POA: Diagnosis present

## 2023-08-07 DIAGNOSIS — Z79899 Other long term (current) drug therapy: Secondary | ICD-10-CM | POA: Diagnosis not present

## 2023-08-07 LAB — URINALYSIS, W/ REFLEX TO CULTURE (INFECTION SUSPECTED)
Bilirubin Urine: NEGATIVE
Glucose, UA: NEGATIVE mg/dL
Ketones, ur: NEGATIVE mg/dL
Nitrite: NEGATIVE
Protein, ur: >=300 mg/dL — AB
RBC / HPF: >50 RBC/hpf (ref 0–5)
Specific Gravity, Urine: 1.016 (ref 1.005–1.030)
pH: 8 (ref 5.0–8.0)

## 2023-08-07 LAB — CBC WITH DIFFERENTIAL/PLATELET
Abs Immature Granulocytes: 0.01 10*3/uL (ref 0.00–0.07)
Basophils Absolute: 0 10*3/uL (ref 0.0–0.1)
Basophils Relative: 1 %
Eosinophils Absolute: 0.4 10*3/uL (ref 0.0–0.5)
Eosinophils Relative: 6 %
HCT: 35.5 % — ABNORMAL LOW (ref 39.0–52.0)
Hemoglobin: 10.9 g/dL — ABNORMAL LOW (ref 13.0–17.0)
Immature Granulocytes: 0 %
Lymphocytes Relative: 30 %
Lymphs Abs: 2 10*3/uL (ref 0.7–4.0)
MCH: 28.2 pg (ref 26.0–34.0)
MCHC: 30.7 g/dL (ref 30.0–36.0)
MCV: 91.7 fL (ref 80.0–100.0)
Monocytes Absolute: 0.6 10*3/uL (ref 0.1–1.0)
Monocytes Relative: 10 %
Neutro Abs: 3.5 10*3/uL (ref 1.7–7.7)
Neutrophils Relative %: 53 %
Platelets: 328 10*3/uL (ref 150–400)
RBC: 3.87 MIL/uL — ABNORMAL LOW (ref 4.22–5.81)
RDW: 15.8 % — ABNORMAL HIGH (ref 11.5–15.5)
WBC: 6.4 10*3/uL (ref 4.0–10.5)
nRBC: 0 % (ref 0.0–0.2)

## 2023-08-07 LAB — BASIC METABOLIC PANEL
Anion gap: 8 (ref 5–15)
BUN: 26 mg/dL — ABNORMAL HIGH (ref 8–23)
CO2: 23 mmol/L (ref 22–32)
Calcium: 9 mg/dL (ref 8.9–10.3)
Chloride: 107 mmol/L (ref 98–111)
Creatinine, Ser: 1.09 mg/dL (ref 0.61–1.24)
GFR, Estimated: >60 mL/min (ref >60)
Glucose, Bld: 125 mg/dL — ABNORMAL HIGH (ref 70–99)
Potassium: 3.5 mmol/L (ref 3.5–5.1)
Sodium: 138 mmol/L (ref 135–145)

## 2023-08-07 NOTE — ED Triage Notes (Signed)
 73 y/o male comes in by EMS c/o painful urination. Pt has a chronic foley for retention and reports it "hurts" when he tries to urinate. Last catheter change was 2 weeks ago. Cloudy amber colored urine noted with urine leaking around the catheter.

## 2023-08-07 NOTE — Discharge Instructions (Signed)
 You were seen in the emergency department for difficulty passing urine through catheter.  Your catheter was replaced with improvement in your symptoms.  A urine culture was sent because your urine is very cloudy.  We will call you if antibiotics need to be started.

## 2023-08-07 NOTE — ED Provider Notes (Signed)
 [place] [facility]ERG[facility] DEPARTMENT AT [facility] Provider Note   CSN: [id number] Arrival date & time: 08/07/23  1939     History {Add pertinent medical, surgical, social history, OB history to HPI:1} Chief Complaint  Patient presents with   Dysuria    73 y/o male comes in by EMS c/o painful urination. Pt has a chronic foley for retention and reports it "hurts" when he tries to urinate. Last catheter change was 2 weeks ago. Cloudy amber colored urine noted with urine leaking around the catheter.      Jason Jones is a 73 y.o. male.  He has a history of stroke, prostate cancer and is bedbound.  Chronic Foley use.  He has had his Foley is not draining and he has lower abdominal pain that started around 5 PM.  Leaking around the catheter.  He thinks he needs his catheter changed.  He denies any fever or vomiting.  The history is provided by the patient.  Male GU Problem Presenting symptoms: dysuria   Context: after urination   Relieved by:  Nothing Associated symptoms: abdominal pain and urinary retention   Associated symptoms: no fever and no vomiting   Risk factors: urinary catheter        Home Medications Prior to Admission medications   Medication Sig Start Date End Date Taking? Authorizing Provider  acetaminophen (TYLENOL) 500 MG tablet Take 2 tablets (1,000 mg total) by mouth every 8 (eight) hours as needed for mild pain, headache or moderate pain. Patient taking differently: Take 1,000 mg by mouth in the morning and at bedtime. 08/05/22   Alwyn Ren, MD  albuterol (PROVENTIL) (2.5 MG/3ML) 0.083% nebulizer solution Take 3 mLs (2.5 mg total) by nebulization every 4 (four) hours as needed for wheezing or shortness of breath. 03/28/22   Marrianne Mood, MD  Amino Acids-Protein Hydrolys (PRO-STAT 64 PO) Take 30 mLs by mouth in the morning and at bedtime.    [provider]  aspirin EC 81 MG tablet Take 1 tablet (81 mg total) by mouth daily. Swallow  whole. 07/05/23 07/04/24  Rolly Salter, MD  atenolol (TENORMIN) 25 MG tablet Take 12.5 mg by mouth every 12 (twelve) hours as needed (BP>130).    [provider]  b complex vitamins capsule Take 1 capsule by mouth in the morning.    [provider]  Baclofen 5 MG TABS Take 7.5 mg by mouth in the morning and at bedtime.    [provider]  docusate sodium (COLACE) 100 MG capsule Take 1 capsule (100 mg total) by mouth 2 (two) times daily. 07/05/23   Rolly Salter, MD  Emollient (AQUAPHOR OINTMENT BODY EX) Apply 1 application  topically See admin instructions. Apply to the lower legs and feet every shift    [provider]  ferrous gluconate (FERGON) 324 MG tablet Take 1 tablet (324 mg total) by mouth daily with breakfast. 12/26/22 06/17/23  Rodolph Bong, MD  melatonin 5 MG TABS Take 5 mg by mouth at bedtime.    [provider]  NON FORMULARY Take 120 mLs by mouth See admin instructions. MedPass 2.0 - Drink 120 ml's by mouth three times a day    [provider]  omeprazole (PRILOSEC) 20 MG capsule Take 20 mg by mouth at bedtime.    [provider]  polyethylene glycol (MIRALAX / GLYCOLAX) 17 g packet Take 17 g by mouth daily as needed. Patient taking differently: Take 17 g by mouth daily  as needed (for constipation). 04/16/22   Kathlen Mody, MD  polyethylene glycol (MIRALAX) 17 g packet Take 17 g by mouth 2 (two) times daily as needed (bowel prep for sigmoidoscopy, start 2/22). 07/05/23   Rolly Salter, MD  rosuvastatin (CRESTOR) 20 MG tablet Take 1 tablet (20 mg total) by mouth daily. 03/29/22   Marrianne Mood, MD  senna-docusate (SENOKOT-S) 8.6-50 MG tablet Take 2 tablets by mouth at bedtime.    [provider]  sodium chloride (OCEAN) 0.65 % SOLN nasal spray Place 1 spray into both nostrils as needed for congestion. 04/16/22   Kathlen Mody, MD      Allergies    Ceftriaxone and Doxycycline    Review of Systems    Review of Systems  Constitutional:  Negative for fever.  Gastrointestinal:  Positive for abdominal pain. Negative for vomiting.  Genitourinary:  Positive for dysuria.    Physical Exam Updated Vital Signs BP 126/87 (BP Location: Right Arm)   Pulse 61   Temp 98 F (36.7 C) (Oral)   Resp 17   Ht 5\' 11"  (1.803 m)   Wt 59.9 kg   SpO2 92%   BMI 18.42 kg/m  Physical Exam Vitals and nursing note reviewed.  Constitutional:      General: He is not in acute distress.    Appearance: Normal appearance. He is well-developed.  HENT:     Head: Normocephalic and atraumatic.  Eyes:     Conjunctiva/sclera: Conjunctivae normal.  Cardiovascular:     Rate and Rhythm: Normal rate and regular rhythm.     Heart sounds: No murmur heard. Pulmonary:     Effort: Pulmonary effort is normal. No respiratory distress.     Breath sounds: Normal breath sounds.  Abdominal:     Palpations: Abdomen is soft.     Tenderness: There is abdominal tenderness (suprapubic). There is no guarding or rebound.  Musculoskeletal:        General: No swelling.     Cervical back: Neck supple.  Skin:    General: Skin is warm and dry.     Capillary Refill: Capillary refill takes less than 2 seconds.  Neurological:     Mental Status: He is alert.     Comments: He is alert.  He has minimal use of his arms and his legs and is contracted.     ED Results / Procedures / Treatments   Labs (all labs ordered are listed, but only abnormal results are displayed) Labs Reviewed - No data to display  EKG None  Radiology No results found.  Procedures Procedures  {Document cardiac monitor, telemetry assessment procedure when appropriate:1}  Medications Ordered in ED Medications - No data to display  ED Course/ Medical Decision Making/ A&P   {   Click here for ABCD2, HEART and other calculatorsREFRESH Note before signing :1}                              Medical Decision Making Amount and/or Complexity of Data  Reviewed Labs: ordered.   This patient complains of ***; this involves an extensive number of treatment Options and is a complaint that carries with it a high risk of complications and morbidity. The differential includes ***  I ordered, reviewed and interpreted labs, which included *** I ordered medication *** and reviewed PMP when indicated. I ordered imaging studies which included *** and I independently    visualized and interpreted imaging which showed ***  Additional history obtained from *** Previous records obtained and reviewed *** I consulted *** and discussed lab and imaging findings and discussed disposition.  Cardiac monitoring reviewed, *** Social determinants considered, *** Critical Interventions: ***  After the interventions stated above, I reevaluated the patient and found *** Admission and further testing considered, ***   {Document critical care time when appropriate:1} {Document review of labs and clinical decision tools ie heart score, Chads2Vasc2 etc:1}  {Document your independent review of radiology images, and any outside records:1} {Document your discussion with family members, caretakers, and with consultants:1} {Document social determinants of health affecting pt's care:1} {Document your decision making why or why not admission, treatments were needed:1} Final Clinical Impression(s) / ED Diagnoses Final diagnoses:  None    Rx / DC Orders ED Discharge Orders     None

## 2023-08-10 LAB — URINE CULTURE: Culture: >=100000 — AB

## 2023-08-11 ENCOUNTER — Telehealth (HOSPITAL_BASED_OUTPATIENT_CLINIC_OR_DEPARTMENT_OTHER): Payer: Self-pay

## 2023-08-11 NOTE — Telephone Encounter (Signed)
 Post ED Visit - Positive Culture Follow-up  Culture report reviewed by antimicrobial stewardship pharmacist: [facility] []  Enzo Bi, Pharm.D. []  Celedonio Miyamoto, Pharm.D., BCPS AQ-ID []  Garvin Fila, Pharm.D., BCPS []  Georgina Pillion, Pharm.D., BCPS []  [place], [address].D., BCPS, AAHIVP []  Estella Husk, Pharm.D., BCPS, AAHIVP []  Lysle Pearl, PharmD, BCPS []  Phillips Climes, PharmD, BCPS []  Agapito Games, PharmD, BCPS []  Verlan Friends, PharmD []  Mervyn Gay, PharmD, BCPS []  Vinnie Level, PharmD  [facility] [x]  Georgina Pillion, PharmD []  Greer Pickerel, PharmD []  Adalberto Cole, PharmD []  Perlie Gold, Rph []  Lonell Face) Jean Rosenthal, PharmD []  Earl Many, PharmD []  Junita Push, PharmD []  Dorna Leitz, PharmD []  Terrilee Files, PharmD []  Lynann Beaver, PharmD []  Keturah Barre, PharmD []  Loralee Pacas, PharmD []  Bernadene Person, PharmD   Positive urine culture Reviewed by ED provider - Alvira Monday, MD Likely colonization, no need to treat and no further patient follow-up is required at this time.  Sandria Senter 08/11/2023, 12:45 PM

## 2023-08-11 NOTE — Progress Notes (Signed)
 ED Antimicrobial Stewardship Positive Culture Follow Up   Jason Jones is an 73 y.o. male who presented to [facility] on 08/07/2023 with a chief complaint of  Chief Complaint  Patient presents with   Dysuria    73 y/o male comes in by EMS c/o painful urination. Pt has a chronic foley for retention and reports it "hurts" when he tries to urinate. Last catheter change was 2 weeks ago. Cloudy amber colored urine noted with urine leaking around the catheter.      Recent Results (from the past 720 hours)  Urine Culture     Status: Abnormal   Collection Time: 08/07/23  9:15 PM   Specimen: Urine, Random  Result Value Ref Range Status   Specimen Description   Final    URINE, RANDOM Performed at [facility], [address] [place], [place] [address]    Special Requests   Final    NONE Reflexed from [id number] Performed at [facility], [address], [place], [place] [address]    Culture (A)  Final    >=100,000 COLONIES/mL PROTEUS VULGARIS WITHIN MIXED UROGENITAL FLORA Performed at [facility], [address], [place], [place] [address]    Report Status 08/10/2023 FINAL  Final   Organism ID, Bacteria PROTEUS VULGARIS (A)  Final      Susceptibility   Proteus vulgaris - MIC*    AMPICILLIN >=32 RESISTANT Resistant     CEFEPIME <=0.12 SENSITIVE Sensitive     CIPROFLOXACIN <=0.25 SENSITIVE Sensitive     GENTAMICIN <=1 SENSITIVE Sensitive     IMIPENEM 0.5 SENSITIVE Sensitive     NITROFURANTOIN RESISTANT Resistant     TRIMETH/SULFA <=20 SENSITIVE Sensitive     AMPICILLIN/SULBACTAM 4 SENSITIVE Sensitive     PIP/TAZO <=4 SENSITIVE Sensitive ug/mL    * >=100,000 COLONIES/mL PROTEUS VULGARIS    [x]  No treatment indicated  72 YOM with abd pain/leaking around catheter resolved with foley exchange. 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   No antibiotics prescribed at discharge - none recommended at this  time.   ED Provider: Alvira Monday, MD  Thank you for allowing pharmacy to be a part of this patient's care.  Georgina Pillion, PharmD, BCPS, BCIDP Infectious Diseases Clinical Pharmacist 08/11/2023 10:13 AM   **Pharmacist phone directory can now be found on [email or web address] (PW [email or web address]).  Listed under [facility].

## 2023-08-14 NOTE — ED Notes (Signed)
 08/14/23 1210 opened chart to reprint Med form for [facility]

## 2024-01-04 ENCOUNTER — Emergency Department (HOSPITAL_COMMUNITY)
Admission: EM | Admit: 2024-01-04 | Discharge: 2024-01-05 | Disposition: A | Discharge status: Home / Self Care | Attending: Emergency Medicine | Admitting: Emergency Medicine

## 2024-01-04 ENCOUNTER — Other Ambulatory Visit: Payer: Self-pay

## 2024-01-04 DIAGNOSIS — Y732 Prosthetic and other implants, materials and accessory gastroenterology and urology devices associated with adverse incidents: Secondary | ICD-10-CM | POA: Insufficient documentation

## 2024-01-04 DIAGNOSIS — T83511A Infection and inflammatory reaction due to indwelling urethral catheter, initial encounter: Secondary | ICD-10-CM | POA: Diagnosis not present

## 2024-01-04 DIAGNOSIS — N390 Urinary tract infection, site not specified: Secondary | ICD-10-CM | POA: Insufficient documentation

## 2024-01-04 DIAGNOSIS — T83091A Other mechanical complication of indwelling urethral catheter, initial encounter: Secondary | ICD-10-CM | POA: Diagnosis present

## 2024-01-04 DIAGNOSIS — T839XXA Unspecified complication of genitourinary prosthetic device, implant and graft, initial encounter: Secondary | ICD-10-CM

## 2024-01-04 LAB — URINALYSIS, ROUTINE W REFLEX MICROSCOPIC
Bilirubin Urine: NEGATIVE
Glucose, UA: NEGATIVE mg/dL
Ketones, ur: NEGATIVE mg/dL
Nitrite: NEGATIVE
Protein, ur: 100 mg/dL — AB
RBC / HPF: >50 RBC/hpf (ref 0–5)
Specific Gravity, Urine: 1.017 (ref 1.005–1.030)
WBC, UA: >50 WBC/hpf (ref 0–5)
pH: 7 (ref 5.0–8.0)

## 2024-01-04 MED ORDER — CIPROFLOXACIN HCL 500 MG PO TABS: 500.0000 mg | ORAL_TABLET | Freq: Two times a day (BID) | ORAL | 0 refills | Status: AC

## 2024-01-04 MED ORDER — CIPROFLOXACIN HCL 500 MG PO TABS
500.0000 mg | ORAL_TABLET | Freq: Two times a day (BID) | ORAL | 0 refills | Status: AC
Start: 2024-01-04 — End: 2024-01-09

## 2024-01-04 NOTE — Discharge Instructions (Addendum)
 You were seen in the ER today for catheter concerns. This was replaced for you. You were found to have an infection in your urine for which I have started you on antibiotics. Please take this as prescribed. Return to the ER for any concerns of new or worsening symptoms.

## 2024-01-04 NOTE — ED Provider Notes (Signed)
 [place] EMERGENCY DEPARTMENT AT [facility] Provider Note   CSN: [id number] Arrival date & time: 01/04/24  2038     Patient presents with: Foley Catheter Complications   Jason Jones is a 73 y.o. male.  Patient with history significant for known prostate cancer currently with indwelling Foley catheter presents the emergency department concerns of a Foley catheter issue.  Patient reportedly is having routine replacement of his catheter at his facility and they are unable to pass catheter through 2 attempts.  Denies any urinary symptoms at this time.   HPI     Prior to Admission medications   Medication Sig Start Date End Date Taking? Authorizing Provider  ciprofloxacin  (CIPRO ) 500 MG tablet Take 1 tablet (500 mg total) by mouth 2 (two) times daily for 5 days. 01/04/24 01/09/24 Yes Linken Mcglothen A, PA-C  ciprofloxacin  (CIPRO ) 500 MG tablet Take 1 tablet (500 mg total) by mouth every 12 (twelve) hours for 5 days. 01/04/24 01/09/24 Yes Ry Moody A, PA-C  acetaminophen  (TYLENOL ) 500 MG tablet Take 2 tablets (1,000 mg total) by mouth every 8 (eight) hours as needed for mild pain, headache or moderate pain. Patient taking differently: Take 1,000 mg by mouth in the morning and at bedtime. 08/05/22   Will Almarie MATSU, MD  albuterol  (PROVENTIL ) (2.5 MG/3ML) 0.083% nebulizer solution Take 3 mLs (2.5 mg total) by nebulization every 4 (four) hours as needed for wheezing or shortness of breath. 03/28/22   Norrine Sharper, MD  Amino Acids-Protein Hydrolys (PRO-STAT 64 PO) Take 30 mLs by mouth in the morning and at bedtime.    [provider]  aspirin  EC 81 MG tablet Take 1 tablet (81 mg total) by mouth daily. Swallow whole. 07/05/23 07/04/24  Tobie Yetta HERO, MD  atenolol  (TENORMIN ) 25 MG tablet Take 12.5 mg by mouth every 12 (twelve) hours as needed (BP>130).    [provider]  b complex vitamins capsule Take 1 capsule by mouth in the morning.    [provider]  Baclofen  5 MG TABS Take 7.5 mg by mouth in the morning and at bedtime.    [provider]  docusate sodium  (COLACE) 100 MG capsule Take 1 capsule (100 mg total) by mouth 2 (two) times daily. 07/05/23   Tobie Yetta HERO, MD  Emollient (AQUAPHOR OINTMENT BODY EX) Apply 1 application  topically See admin instructions. Apply to the lower legs and feet every shift    [provider]  ferrous gluconate  (FERGON) 324 MG tablet Take 1 tablet (324 mg total) by mouth daily with breakfast. 12/26/22 06/17/23  Sebastian Toribio GAILS, MD  melatonin 5 MG TABS Take 5 mg by mouth at bedtime.    [provider]  NON FORMULARY Take 120 mLs by mouth See admin instructions. MedPass 2.0 - Drink 120 ml's by mouth three times a day    [provider]  omeprazole (PRILOSEC) 20 MG capsule Take 20 mg by mouth at bedtime.    [provider]  polyethylene glycol (MIRALAX  / GLYCOLAX ) 17 g packet Take 17 g by mouth daily as needed. Patient taking differently: Take 17 g by mouth daily as needed (for constipation). 04/16/22   Akula, Vijaya, MD  polyethylene glycol (MIRALAX ) 17 g packet Take 17 g by mouth 2 (two) times daily as needed (bowel prep for sigmoidoscopy, start 2/22). 07/05/23   Tobie Yetta HERO, MD  rosuvastatin  (CRESTOR ) 20 MG tablet Take 1 tablet (20 mg total) by mouth daily. 03/29/22  Norrine Sharper, MD  senna-docusate (SENOKOT-S) 8.6-50 MG tablet Take 2 tablets by mouth at bedtime.    [provider]  sodium chloride  (OCEAN) 0.65 % SOLN nasal spray Place 1 spray into both nostrils as needed for congestion. 04/16/22   Akula, Vijaya, MD    Allergies: Ceftriaxone  and Doxycycline    Review of Systems  Genitourinary:        Catheter issues  All other systems reviewed and are negative.   Updated Vital Signs BP 108/83 (BP Location: Right Arm)   Pulse 89   Temp 98.1 F (36.7 C) (Oral)   Resp 15   Ht 5' 11 (1.803 m)   Wt 64.4 kg   SpO2 92%   BMI 19.80  kg/m   Physical Exam Vitals and nursing note reviewed.  Constitutional:      General: He is not in acute distress.    Appearance: He is well-developed.  HENT:     Head: Normocephalic and atraumatic.  Eyes:     Conjunctiva/sclera: Conjunctivae normal.  Cardiovascular:     Rate and Rhythm: Normal rate and regular rhythm.     Heart sounds: No murmur heard. Pulmonary:     Effort: Pulmonary effort is normal. No respiratory distress.     Breath sounds: Normal breath sounds.  Abdominal:     Palpations: Abdomen is soft.     Tenderness: There is no abdominal tenderness.  Genitourinary:    Comments: Indwelling catheter placed with no leaking present. Musculoskeletal:        General: No swelling.     Cervical back: Neck supple.  Skin:    General: Skin is warm and dry.     Capillary Refill: Capillary refill takes less than 2 seconds.  Neurological:     Mental Status: He is alert.  Psychiatric:        Mood and Affect: Mood normal.     (all labs ordered are listed, but only abnormal results are displayed) Labs Reviewed  URINALYSIS, ROUTINE W REFLEX MICROSCOPIC - Abnormal; Notable for the following components:      Result Value   APPearance TURBID (*)    Hgb urine dipstick MODERATE (*)    Protein, ur 100 (*)    Leukocytes,Ua MODERATE (*)    Bacteria, UA MANY (*)    All other components within normal limits  URINE CULTURE    EKG: None  Radiology: No results found.   Procedures   Medications Ordered in the ED - No data to display                                  Medical Decision Making Amount and/or Complexity of Data Reviewed Labs: ordered.  Risk Prescription drug management.   This patient presents to the ED for concern of foley catheter concerns. Differential diagnosis includes catheter removal, urinary obstruction, UTI   Lab Tests:  I Ordered, and personally interpreted labs.  The pertinent results include:  UA findings consistent with likely developing  infection with bacteria and leukocytes, no nitrites present, urine turbid in appearance   Problem List / ED Course:  Patient presents to the emergency department concerns catheter concerns.  Patient was brought in after his facility was unable to replace his Foley catheter after 2 attempts.  He does have a history of prostate cancer and unsure this may be contributing to the difficulty placing the Foley. Condom catheter was placed as a temporary  solution.  Patient is positive for concerns.  Will contact catheter replaced patient Nurse was able to replace foley with coude catheter. Tolerating well and no leaking around site. Will run UA given malodorous urine. Doubt pyelonephritis given no reported symptoms and no fevers present. UA consistent with likely developing infection. Given catheter associated concern, will start on ciprofloxacin  and send urine off for culture. Tried calling patient's daughter to inform her of course and disposition and was unable to speak with her but left a voicemail. Return precautions discussed. Discharged home in stable condition.   Social Determinants of Health:  Resident of skilled nursing facility  Final diagnoses:  Foley catheter problem, initial encounter [facility])  Urinary tract infection associated with indwelling urethral catheter, initial encounter [facility])    ED Discharge Orders          Ordered    ciprofloxacin  (CIPRO ) 500 MG tablet  2 times daily        01/04/24 2330    ciprofloxacin  (CIPRO ) 500 MG tablet  Every 12 hours        01/04/24 2338               Treina Arscott A, PA-C 01/04/24 2344    Cottie Donnice PARAS, MD 01/05/24 1501

## 2024-01-04 NOTE — ED Triage Notes (Signed)
 Pt BIBA from [facility] with foley catheter complications. Facility attempted x2 to routinely to replace catheter but unsuccessful. Catheter leaking around site and Pt stated very painful to urinate therefore facility pulled it.  HR:94 BP:118(Palpated) O2: 94%RA

## 2024-01-04 NOTE — ED Notes (Signed)
 [facility] transportation set-up for Pt's return back to facility.

## 2024-01-05 NOTE — ED Notes (Signed)
 This RN attempted x2 to call facility for care handoff report. No answer.

## 2024-01-05 NOTE — ED Notes (Addendum)
 This RN attempted x1 to call [phone] and give care handoff report. No answer.

## 2024-01-05 NOTE — ED Notes (Signed)
 This RN attempted x3 to call facility for care handoff report. No answer.

## 2024-01-07 LAB — URINE CULTURE
Culture: >=100000 — AB
Special Requests: NORMAL

## 2024-01-08 ENCOUNTER — Telehealth (HOSPITAL_BASED_OUTPATIENT_CLINIC_OR_DEPARTMENT_OTHER): Payer: Self-pay

## 2024-01-08 NOTE — Progress Notes (Signed)
 ED Antimicrobial Stewardship Positive Culture Follow Up   Jason Jones is an 73 y.o. male who presented to [facility] on 01/04/2024 with a chief complaint of  Chief Complaint  Patient presents with   Foley Catheter Complications    Recent Results (from the past 720 hours)  Urine Culture     Status: Abnormal   Collection Time: 01/04/24 10:30 PM   Specimen: Urine, Clean Catch  Result Value Ref Range Status   Specimen Description   Final    URINE, CLEAN CATCH Performed at [facility], [address], [place], [place] [address]    Special Requests   Final    Normal Performed at [facility], [address], [place], [place] [address]    Culture (A)  Final    >=100,000 COLONIES/mL ESCHERICHIA COLI Confirmed Extended Spectrum Beta-Lactamase Producer (ESBL).  In bloodstream infections from ESBL organisms, carbapenems are preferred over piperacillin/tazobactam. They are shown to have a lower risk of mortality. 50,000 COLONIES/mL PROTEUS MIRABILIS    Report Status 01/07/2024 FINAL  Final   Organism ID, Bacteria ESCHERICHIA COLI (A)  Final   Organism ID, Bacteria PROTEUS MIRABILIS (A)  Final      Susceptibility   Escherichia coli - MIC*    AMPICILLIN >=32 RESISTANT Resistant     CEFAZOLIN  >=64 RESISTANT Resistant     CEFEPIME >=32 RESISTANT Resistant     CEFTRIAXONE  >=64 RESISTANT Resistant     CIPROFLOXACIN  >=4 RESISTANT Resistant     GENTAMICIN  <=1 SENSITIVE Sensitive     IMIPENEM <=0.25 SENSITIVE Sensitive     NITROFURANTOIN <=16 SENSITIVE Sensitive     TRIMETH/SULFA <=20 SENSITIVE Sensitive     AMPICILLIN/SULBACTAM >=32 RESISTANT Resistant     PIP/TAZO 8 SENSITIVE Sensitive ug/mL    * >=100,000 COLONIES/mL ESCHERICHIA COLI   Proteus mirabilis - MIC*    AMPICILLIN <=2 SENSITIVE Sensitive     CEFAZOLIN  <=4 SENSITIVE Sensitive     CEFEPIME <=0.12 SENSITIVE Sensitive     CEFTRIAXONE  <=0.25 SENSITIVE Sensitive     CIPROFLOXACIN  2 RESISTANT  Resistant     GENTAMICIN  <=1 SENSITIVE Sensitive     IMIPENEM 4 SENSITIVE Sensitive     NITROFURANTOIN 128 RESISTANT Resistant     TRIMETH/SULFA <=20 SENSITIVE Sensitive     AMPICILLIN/SULBACTAM <=2 SENSITIVE Sensitive     PIP/TAZO <=4 SENSITIVE Sensitive ug/mL    * 50,000 COLONIES/mL PROTEUS MIRABILIS    [x]  Treated with ciprofloxacin , organism resistant to prescribed antimicrobial  73 YOM who presented due to facility unable to exchange foley due for replacement after two attempts. Exchange done in the ED. No urinary complaints or systemic signs, UTI treatment done based off of +UA only. Organisms likely colonization and resistant to prescribed antimicrobial.   Call the patient to STOP ciprofloxacin  - no additional antibiotics indicated at this time  ED Provider: Thom Fetters, MD  Thank you for allowing pharmacy to be a part of this patient's care.  Almarie Lunger, PharmD, BCPS, BCIDP Infectious Diseases Clinical Pharmacist 01/08/2024 8:28 AM   **Pharmacist phone directory can now be found on [email or web address] ([email or web address]).  Listed under [facility].

## 2024-01-08 NOTE — Telephone Encounter (Signed)
 Post ED Visit - Positive Culture Follow-up: Successful Patient Follow-Up  Culture assessed and recommendations reviewed by:  []  Rankin Dee, Pharm.D. []  Venetia Gully, Pharm.D., BCPS AQ-ID []  Garrel Crews, Pharm.D., BCPS [x]  Almarie Lunger, Pharm.D., BCPS []  [place], [address].D., BCPS, AAHIVP []  Rosaline Bihari, Pharm.D., BCPS, AAHIVP []  Vernell Meier, PharmD, BCPS []  Latanya Hint, PharmD, BCPS []  Donald Medley, PharmD, BCPS []  Rocky Bold, PharmD  Positive urine culture  []  Patient discharged without antimicrobial prescription and treatment is now indicated []  Organism is resistant to prescribed ED discharge antimicrobial []  Patient with positive blood cultures  Changes discussed with ED provider: Thom Fetters, MD  Plan: call pt to stop taking Cipro , no abx needed.  Called and spoke with RN at [facility]. Results faxed to [facility].   Contacted patient caregiver, date 01/08/24, time 10:15 am   Ruth Camelia Elbe 01/08/2024, 10:16 AM

## 2024-04-12 ENCOUNTER — Emergency Department (HOSPITAL_COMMUNITY)
Admission: EM | Admit: 2024-04-12 | Discharge: 2024-04-12 | Disposition: A | Discharge status: Skilled Nursing Facility | Attending: Emergency Medicine | Admitting: Emergency Medicine

## 2024-04-12 ENCOUNTER — Encounter (HOSPITAL_COMMUNITY): Payer: Self-pay

## 2024-04-12 DIAGNOSIS — Z8673 Personal history of transient ischemic attack (TIA), and cerebral infarction without residual deficits: Secondary | ICD-10-CM | POA: Insufficient documentation

## 2024-04-12 DIAGNOSIS — R339 Retention of urine, unspecified: Secondary | ICD-10-CM | POA: Diagnosis present

## 2024-04-12 DIAGNOSIS — I10 Essential (primary) hypertension: Secondary | ICD-10-CM | POA: Diagnosis not present

## 2024-04-12 DIAGNOSIS — Y732 Prosthetic and other implants, materials and accessory gastroenterology and urology devices associated with adverse incidents: Secondary | ICD-10-CM | POA: Diagnosis not present

## 2024-04-12 DIAGNOSIS — D72829 Elevated white blood cell count, unspecified: Secondary | ICD-10-CM | POA: Diagnosis not present

## 2024-04-12 DIAGNOSIS — T83091A Other mechanical complication of indwelling urethral catheter, initial encounter: Secondary | ICD-10-CM | POA: Diagnosis not present

## 2024-04-12 DIAGNOSIS — Z7951 Long term (current) use of inhaled steroids: Secondary | ICD-10-CM | POA: Diagnosis not present

## 2024-04-12 DIAGNOSIS — Z7982 Long term (current) use of aspirin: Secondary | ICD-10-CM | POA: Diagnosis not present

## 2024-04-12 DIAGNOSIS — Z8546 Personal history of malignant neoplasm of prostate: Secondary | ICD-10-CM | POA: Insufficient documentation

## 2024-04-12 DIAGNOSIS — J45909 Unspecified asthma, uncomplicated: Secondary | ICD-10-CM | POA: Insufficient documentation

## 2024-04-12 DIAGNOSIS — T839XXA Unspecified complication of genitourinary prosthetic device, implant and graft, initial encounter: Secondary | ICD-10-CM

## 2024-04-12 DIAGNOSIS — Z79899 Other long term (current) drug therapy: Secondary | ICD-10-CM | POA: Insufficient documentation

## 2024-04-12 LAB — CBC WITH DIFFERENTIAL/PLATELET
Abs Immature Granulocytes: 0.02 K/uL (ref 0.00–0.07)
Basophils Absolute: 0 K/uL (ref 0.0–0.1)
Basophils Relative: 1 %
Eosinophils Absolute: 0.5 K/uL (ref 0.0–0.5)
Eosinophils Relative: 7 %
HCT: 41.4 % (ref 39.0–52.0)
Hemoglobin: 13.1 g/dL (ref 13.0–17.0)
Immature Granulocytes: 0 %
Lymphocytes Relative: 22 %
Lymphs Abs: 1.6 K/uL (ref 0.7–4.0)
MCH: 28.5 pg (ref 26.0–34.0)
MCHC: 31.6 g/dL (ref 30.0–36.0)
MCV: 90 fL (ref 80.0–100.0)
Monocytes Absolute: 0.7 K/uL (ref 0.1–1.0)
Monocytes Relative: 9 %
Neutro Abs: 4.7 K/uL (ref 1.7–7.7)
Neutrophils Relative %: 61 %
Platelets: 256 K/uL (ref 150–400)
RBC: 4.6 MIL/uL (ref 4.22–5.81)
RDW: 15 % (ref 11.5–15.5)
WBC: 7.5 K/uL (ref 4.0–10.5)
nRBC: 0 % (ref 0.0–0.2)

## 2024-04-12 LAB — URINALYSIS, ROUTINE W REFLEX MICROSCOPIC
Bilirubin Urine: NEGATIVE
Glucose, UA: NEGATIVE mg/dL
Ketones, ur: NEGATIVE mg/dL
Nitrite: NEGATIVE
Protein, ur: 100 mg/dL — AB
RBC / HPF: >50 RBC/hpf (ref 0–5)
Specific Gravity, Urine: 1.015 (ref 1.005–1.030)
WBC, UA: >50 WBC/hpf (ref 0–5)
pH: 7 (ref 5.0–8.0)

## 2024-04-12 LAB — BASIC METABOLIC PANEL WITH GFR
Anion gap: 9 (ref 5–15)
BUN: 18 mg/dL (ref 8–23)
CO2: 26 mmol/L (ref 22–32)
Calcium: 9.4 mg/dL (ref 8.9–10.3)
Chloride: 109 mmol/L (ref 98–111)
Creatinine, Ser: 1.12 mg/dL (ref 0.61–1.24)
GFR, Estimated: >60 mL/min (ref >60)
Glucose, Bld: 127 mg/dL — ABNORMAL HIGH (ref 70–99)
Potassium: 3.9 mmol/L (ref 3.5–5.1)
Sodium: 144 mmol/L (ref 135–145)

## 2024-04-12 NOTE — ED Triage Notes (Signed)
 Patient arrived with complaints of urinary retention, foley has not been draining over the last 12 hours

## 2024-04-12 NOTE — ED Provider Notes (Signed)
 [place] EMERGENCY DEPARTMENT AT [facility] Provider Note  CSN: [id number] Arrival date & time: 04/12/24 9792  Chief Complaint(s) Urinary Retention  HPI Jason Jones is a 73 y.o. male with a past medical history listed below including prior stroke with left-sided deficits, urinary retention requiring indwelling Foley who presents to the emergency department with sensation of urinary retention.  Patient reports that the Foley catheter has not been changed since he was last seen here in July.  Denies any other physical complaints.  HPI  Past Medical History Past Medical History:  Diagnosis Date   Acute ischemic right PCA stroke (HCC) 03/25/2022   Arthritis    Asthma    Cancer (HCC)    CAP (community acquired pneumonia) 07/20/2022   Chronic alcohol abuse    Dyspnea    GERD (gastroesophageal reflux disease)    PRN  ---  TAKES BAKING SODA IN WATER   History of pneumothorax    04-28-2006  fell, left fx rib--  resolved w/ chest tube   Hypertension    Nocturia    Poor dental hygiene    Right inguinal hernia    Weak urinary stream    Patient Active Problem List   Diagnosis Date Noted   Hematochezia 07/02/2023   Bacteriuria 12/26/2022   Acute cystitis with hematuria 12/25/2022   Acute renal failure superimposed on stage 3a chronic kidney disease (HCC) 12/21/2022   Acute metabolic encephalopathy 12/21/2022   Urinary retention 07/31/2022   Hydronephrosis, bilateral 07/31/2022   Urinary tract infection associated with indwelling urethral catheter 07/31/2022   Leukocytosis 07/30/2022   Pressure injury of skin 07/20/2022   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   Chronic hepatitis C (HCC) 07/20/2022   Protein-calorie malnutrition, severe 07/20/2022   Rectal bleeding 07/20/2022   Normocytic anemia 06/28/2022   Hematuria 05/01/2022   History of CVA (cerebrovascular accident) 04/17/2022   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   Malnutrition of moderate degree  04/15/2022   Primary hypertension 03/25/2022   Dysphagia due to recent cerebrovascular accident (CVA) 03/25/2022   Elevated liver enzymes 03/25/2022   Rhabdomyolysis 03/25/2022   Prostate cancer (HCC) 02/20/2021   Home Medication(s) Prior to Admission medications   Medication Sig Start Date End Date Taking? Authorizing Provider  acetaminophen  (TYLENOL ) 500 MG tablet Take 2 tablets (1,000 mg total) by mouth every 8 (eight) hours as needed for mild pain, headache or moderate pain. Patient taking differently: Take 1,000 mg by mouth in the morning and at bedtime. 08/05/22   Will Almarie MATSU, MD  albuterol  (PROVENTIL ) (2.5 MG/3ML) 0.083% nebulizer solution Take 3 mLs (2.5 mg total) by nebulization every 4 (four) hours as needed for wheezing or shortness of breath. 03/28/22   Norrine Sharper, MD  Amino Acids-Protein Hydrolys (PRO-STAT 64 PO) Take 30 mLs by mouth in the morning and at bedtime.    [provider]  aspirin  EC 81 MG tablet Take 1 tablet (81 mg total) by mouth daily. Swallow whole. 07/05/23 07/04/24  Tobie Yetta HERO, MD  atenolol  (TENORMIN ) 25 MG tablet Take 12.5 mg by mouth every 12 (twelve) hours as needed (BP>130).    [provider]  b complex vitamins capsule Take 1 capsule by mouth in the morning.    [provider]  Baclofen  5 MG TABS Take 7.5 mg by mouth in the morning and at bedtime.    [provider]  docusate sodium  (COLACE) 100 MG capsule Take 1 capsule (100 mg total) by mouth 2 (two) times daily.  07/05/23   Tobie Yetta HERO, MD  Emollient (AQUAPHOR OINTMENT BODY EX) Apply 1 application  topically See admin instructions. Apply to the lower legs and feet every shift    [provider]  ferrous gluconate  (FERGON) 324 MG tablet Take 1 tablet (324 mg total) by mouth daily with breakfast. 12/26/22 06/17/23  Sebastian Toribio GAILS, MD  melatonin 5 MG TABS Take 5 mg by mouth at bedtime.    [provider]  NON FORMULARY Take 120 mLs by  mouth See admin instructions. MedPass 2.0 - Drink 120 ml's by mouth three times a day    [provider]  omeprazole (PRILOSEC) 20 MG capsule Take 20 mg by mouth at bedtime.    [provider]  polyethylene glycol (MIRALAX  / GLYCOLAX ) 17 g packet Take 17 g by mouth daily as needed. Patient taking differently: Take 17 g by mouth daily as needed (for constipation). 04/16/22   Akula, Vijaya, MD  polyethylene glycol (MIRALAX ) 17 g packet Take 17 g by mouth 2 (two) times daily as needed (bowel prep for sigmoidoscopy, start 2/22). 07/05/23   Tobie Yetta HERO, MD  rosuvastatin  (CRESTOR ) 20 MG tablet Take 1 tablet (20 mg total) by mouth daily. 03/29/22   Norrine Sharper, MD  senna-docusate (SENOKOT-S) 8.6-50 MG tablet Take 2 tablets by mouth at bedtime.    [provider]  sodium chloride  (OCEAN) 0.65 % SOLN nasal spray Place 1 spray into both nostrils as needed for congestion. 04/16/22   Akula, Vijaya, MD                                                                                                                                    Allergies Ceftriaxone  and Doxycycline  Review of Systems Review of Systems As noted in HPI  Physical Exam Vital Signs  I have reviewed the triage vital signs BP (!) 143/84   Pulse 75   Temp 97.9 F (36.6 C) (Oral)   Resp 17   SpO2 98%   Physical Exam Vitals reviewed.  Constitutional:      General: He is not in acute distress.    Appearance: He is well-developed. He is not diaphoretic.  HENT:     Head: Normocephalic and atraumatic.     Right Ear: External ear normal.     Left Ear: External ear normal.     Nose: Nose normal.     Mouth/Throat:     Mouth: Mucous membranes are moist.  Eyes:     General: No scleral icterus.    Conjunctiva/sclera: Conjunctivae normal.  Neck:     Trachea: Phonation normal.  Cardiovascular:     Rate and Rhythm: Normal rate and regular rhythm.  Pulmonary:     Effort: Pulmonary effort is normal. No  respiratory distress.     Breath sounds: No stridor.  Abdominal:     General: There is no distension.  Genitourinary:  Comments: Foley catheter in place with cloudy urine in bag Musculoskeletal:        General: Normal range of motion.     Cervical back: Normal range of motion.  Neurological:     Mental Status: He is alert and oriented to person, place, and time.  Psychiatric:        Behavior: Behavior normal.     ED Results and Treatments Labs (all labs ordered are listed, but only abnormal results are displayed) Labs Reviewed  URINALYSIS, ROUTINE W REFLEX MICROSCOPIC - Abnormal; Notable for the following components:      Result Value   APPearance CLOUDY (*)    Hgb urine dipstick MODERATE (*)    Protein, ur 100 (*)    Leukocytes,Ua LARGE (*)    Bacteria, UA FEW (*)    All other components within normal limits  BASIC METABOLIC PANEL WITH GFR - Abnormal; Notable for the following components:   Glucose, Bld 127 (*)    All other components within normal limits  URINE CULTURE  CBC WITH DIFFERENTIAL/PLATELET                                                                                                                         EKG  EKG Interpretation Date/Time:    Ventricular Rate:    PR Interval:    QRS Duration:    QT Interval:    QTC Calculation:   R Axis:      Text Interpretation:         Radiology No results found.  Medications Ordered in ED Medications - No data to display Procedures Procedures  (including critical care time) Medical Decision Making / ED Course   Medical Decision Making Amount and/or Complexity of Data Reviewed Labs: ordered.   Bladder scan <100cc  Foley catheter replaced and confirmed volume. CBC leukocytosis.  BMP without renal insufficiency.  UA similar to prior but no bacteria in the last few.  He previously had E. Coli and Proteus previously.  This was felt to be colonization given his lack of symptoms.  Patient is currently  denying any suprapubic discomfort.  Will send culture and hold treatment for now.     Final Clinical Impression(s) / ED Diagnoses Final diagnoses:  Complication of Foley catheter, initial encounter   The patient appears reasonably screened and/or stabilized for discharge and I doubt any other medical condition or other [facility] requiring further screening, evaluation, or treatment in the ED at this time. I have discussed the findings, Dx and Tx plan with the patient/family who expressed understanding and agree(s) with the plan. Discharge instructions discussed at length. The patient/family was given strict return precautions who verbalized understanding of the instructions. No further questions at time of discharge.  Disposition: Discharge  Condition: Good  ED Discharge Orders     None        Follow Up: Leontine Cramp, NP [address] [place] [address] [phone]  Call  to schedule an appointment  for close follow up     This chart was dictated using voice recognition software.  Despite best efforts to proofread,  errors can occur which can change the documentation meaning.    Trine Raynell Moder, MD 04/12/24 [phone]

## 2024-04-12 NOTE — ED Notes (Signed)
 [facility] called and transport arranged.

## 2024-04-16 LAB — URINE CULTURE: Culture: >=100000 — AB

## 2024-04-28 ENCOUNTER — Emergency Department (HOSPITAL_COMMUNITY)
Admission: EM | Admit: 2024-04-28 | Discharge: 2024-04-29 | Disposition: A | Discharge status: Home / Self Care | Attending: Emergency Medicine | Admitting: Emergency Medicine

## 2024-04-28 ENCOUNTER — Other Ambulatory Visit: Payer: Self-pay

## 2024-04-28 DIAGNOSIS — Z8546 Personal history of malignant neoplasm of prostate: Secondary | ICD-10-CM | POA: Diagnosis not present

## 2024-04-28 DIAGNOSIS — F17200 Nicotine dependence, unspecified, uncomplicated: Secondary | ICD-10-CM | POA: Insufficient documentation

## 2024-04-28 DIAGNOSIS — Z960 Presence of urogenital implants: Secondary | ICD-10-CM | POA: Diagnosis not present

## 2024-04-28 DIAGNOSIS — Z7982 Long term (current) use of aspirin: Secondary | ICD-10-CM | POA: Insufficient documentation

## 2024-04-28 DIAGNOSIS — R350 Frequency of micturition: Secondary | ICD-10-CM | POA: Diagnosis present

## 2024-04-28 DIAGNOSIS — N390 Urinary tract infection, site not specified: Secondary | ICD-10-CM | POA: Insufficient documentation

## 2024-04-28 NOTE — ED Triage Notes (Signed)
 Pt BIB [facility] fro [facility]. 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 His bag has been leaking around the catheter. Pt also has had pus and pain around insertion site.  128/78 88HR 94% RA

## 2024-04-29 DIAGNOSIS — N390 Urinary tract infection, site not specified: Secondary | ICD-10-CM | POA: Diagnosis not present

## 2024-04-29 LAB — CBC WITH DIFFERENTIAL/PLATELET
Abs Immature Granulocytes: 0.02 K/uL (ref 0.00–0.07)
Basophils Absolute: 0 K/uL (ref 0.0–0.1)
Basophils Relative: 1 %
Eosinophils Absolute: 0.5 K/uL (ref 0.0–0.5)
Eosinophils Relative: 7 %
HCT: 41.6 % (ref 39.0–52.0)
Hemoglobin: 13.4 g/dL (ref 13.0–17.0)
Immature Granulocytes: 0 %
Lymphocytes Relative: 30 %
Lymphs Abs: 2 K/uL (ref 0.7–4.0)
MCH: 29.1 pg (ref 26.0–34.0)
MCHC: 32.2 g/dL (ref 30.0–36.0)
MCV: 90.2 fL (ref 80.0–100.0)
Monocytes Absolute: 0.6 K/uL (ref 0.1–1.0)
Monocytes Relative: 9 %
Neutro Abs: 3.5 K/uL (ref 1.7–7.7)
Neutrophils Relative %: 53 %
Platelets: 257 K/uL (ref 150–400)
RBC: 4.61 MIL/uL (ref 4.22–5.81)
RDW: 15 % (ref 11.5–15.5)
WBC: 6.6 K/uL (ref 4.0–10.5)
nRBC: 0 % (ref 0.0–0.2)

## 2024-04-29 LAB — URINALYSIS, ROUTINE W REFLEX MICROSCOPIC
Bilirubin Urine: NEGATIVE
Glucose, UA: NEGATIVE mg/dL
Ketones, ur: NEGATIVE mg/dL
Nitrite: NEGATIVE
Protein, ur: 100 mg/dL — AB
RBC / HPF: >50 RBC/hpf (ref 0–5)
Specific Gravity, Urine: 1.016 (ref 1.005–1.030)
WBC, UA: >50 WBC/hpf (ref 0–5)
pH: 8 (ref 5.0–8.0)

## 2024-04-29 LAB — BASIC METABOLIC PANEL WITH GFR
Anion gap: 10 (ref 5–15)
BUN: 20 mg/dL (ref 8–23)
CO2: 24 mmol/L (ref 22–32)
Calcium: 9.6 mg/dL (ref 8.9–10.3)
Chloride: 114 mmol/L — ABNORMAL HIGH (ref 98–111)
Creatinine, Ser: 1.14 mg/dL (ref 0.61–1.24)
GFR, Estimated: >60 mL/min (ref >60)
Glucose, Bld: 114 mg/dL — ABNORMAL HIGH (ref 70–99)
Potassium: 5.1 mmol/L (ref 3.5–5.1)
Sodium: 148 mmol/L — ABNORMAL HIGH (ref 135–145)

## 2024-04-29 MED ORDER — CIPROFLOXACIN HCL 500 MG PO TABS
500.0000 mg | ORAL_TABLET | Freq: Once | ORAL | Status: AC
  Administered 2024-04-29: 500 mg via ORAL
  Filled 2024-04-29: qty 1

## 2024-04-29 MED ORDER — FOSFOMYCIN TROMETHAMINE 3 G PO PACK: 3.0000 g | PACK | Freq: Once | ORAL | Status: DC

## 2024-04-29 MED ORDER — CIPROFLOXACIN HCL 500 MG PO TABS: 500.0000 mg | ORAL_TABLET | Freq: Two times a day (BID) | ORAL | 0 refills | Status: AC

## 2024-04-29 NOTE — ED Notes (Signed)
 Transport crew has arrived.   All necessary paperwork and belongings handed off to crew.

## 2024-04-29 NOTE — ED Provider Notes (Signed)
 [place] EMERGENCY DEPARTMENT AT [facility] Provider Note   CSN: [id number] Arrival date & time: 04/28/24  2153     Patient presents with: Urinary Frequency   Jason Jones is a 73 y.o. male.  Patient with past medical history significant for CVA, prostate cancer, UTI associated with indwelling urethral catheter presents to the emergency department via EMS from [employer].  Patient reports that patient has been retaining urine and has been leaking urine around his catheter.  They also report pus and pain around the urethral meatus.  Patient endorses some mild suprapubic discomfort but denies other complaints.  He states he has the catheter due to his stroke and is unclear as to how long it has been in place.  Chart review shows that patient apparently had catheter replaced at the beginning of this month.    Urinary Frequency       Prior to Admission medications   Medication Sig Start Date End Date Taking? Authorizing Provider  ciprofloxacin  (CIPRO ) 500 MG tablet Take 1 tablet (500 mg total) by mouth every 12 (twelve) hours for 7 days. 04/29/24 05/06/24 Yes Logan Ubaldo NOVAK, PA-C  acetaminophen  (TYLENOL ) 500 MG tablet Take 2 tablets (1,000 mg total) by mouth every 8 (eight) hours as needed for mild pain, headache or moderate pain. Patient taking differently: Take 1,000 mg by mouth in the morning and at bedtime. 08/05/22   Will Almarie MATSU, MD  albuterol  (PROVENTIL ) (2.5 MG/3ML) 0.083% nebulizer solution Take 3 mLs (2.5 mg total) by nebulization every 4 (four) hours as needed for wheezing or shortness of breath. 03/28/22   Norrine Sharper, MD  Amino Acids-Protein Hydrolys (PRO-STAT 64 PO) Take 30 mLs by mouth in the morning and at bedtime.    [provider]  aspirin  EC 81 MG tablet Take 1 tablet (81 mg total) by mouth daily. Swallow whole. 07/05/23 07/04/24  Tobie Yetta HERO, MD  atenolol  (TENORMIN ) 25 MG tablet Take 12.5 mg by mouth every 12 (twelve) hours as  needed (BP>130).    [provider]  b complex vitamins capsule Take 1 capsule by mouth in the morning.    [provider]  Baclofen  5 MG TABS Take 7.5 mg by mouth in the morning and at bedtime.    [provider]  docusate sodium  (COLACE) 100 MG capsule Take 1 capsule (100 mg total) by mouth 2 (two) times daily. 07/05/23   Tobie Yetta HERO, MD  Emollient (AQUAPHOR OINTMENT BODY EX) Apply 1 application  topically See admin instructions. Apply to the lower legs and feet every shift    [provider]  ferrous gluconate  (FERGON) 324 MG tablet Take 1 tablet (324 mg total) by mouth daily with breakfast. 12/26/22 06/17/23  Sebastian Toribio GAILS, MD  melatonin 5 MG TABS Take 5 mg by mouth at bedtime.    [provider]  NON FORMULARY Take 120 mLs by mouth See admin instructions. MedPass 2.0 - Drink 120 ml's by mouth three times a day    [provider]  omeprazole (PRILOSEC) 20 MG capsule Take 20 mg by mouth at bedtime.    [provider]  polyethylene glycol (MIRALAX  / GLYCOLAX ) 17 g packet Take 17 g by mouth daily as needed. Patient taking differently: Take 17 g by mouth daily as needed (for constipation). 04/16/22   Akula, Vijaya, MD  polyethylene glycol (MIRALAX ) 17 g packet Take 17 g by mouth 2 (two) times daily as needed (bowel prep for sigmoidoscopy, start 2/22).  07/05/23   Tobie Yetta HERO, MD  rosuvastatin  (CRESTOR ) 20 MG tablet Take 1 tablet (20 mg total) by mouth daily. 03/29/22   Norrine Sharper, MD  senna-docusate (SENOKOT-S) 8.6-50 MG tablet Take 2 tablets by mouth at bedtime.    [provider]  sodium chloride  (OCEAN) 0.65 % SOLN nasal spray Place 1 spray into both nostrils as needed for congestion. 04/16/22   Akula, Vijaya, MD    Allergies: Ceftriaxone  and Doxycycline    Review of Systems  Genitourinary:  Positive for frequency.    Updated Vital Signs BP (!) 125/90   Pulse 87   Temp 98 F (36.7 C) (Oral)   Resp 18    SpO2 93%   Physical Exam Vitals and nursing note reviewed.  HENT:     Head: Normocephalic and atraumatic.  Eyes:     Pupils: Pupils are equal, round, and reactive to light.  Cardiovascular:     Rate and Rhythm: Normal rate.  Pulmonary:     Effort: Pulmonary effort is normal. No respiratory distress.  Abdominal:     Palpations: Abdomen is soft.     Tenderness: There is abdominal tenderness.     Comments: Mild suprapubic tenderness  Musculoskeletal:        General: No signs of injury.     Cervical back: Normal range of motion.  Skin:    General: Skin is dry.  Neurological:     Mental Status: He is alert.  Psychiatric:        Speech: Speech normal.        Behavior: Behavior normal.     (all labs ordered are listed, but only abnormal results are displayed) Labs Reviewed  BASIC METABOLIC PANEL WITH GFR - Abnormal; Notable for the following components:      Result Value   Sodium 148 (*)    Chloride 114 (*)    Glucose, Bld 114 (*)    All other components within normal limits  URINALYSIS, ROUTINE W REFLEX MICROSCOPIC - Abnormal; Notable for the following components:   APPearance CLOUDY (*)    Hgb urine dipstick MODERATE (*)    Protein, ur 100 (*)    Leukocytes,Ua MODERATE (*)    Bacteria, UA MANY (*)    All other components within normal limits  URINE CULTURE  CBC WITH DIFFERENTIAL/PLATELET  CBC WITH DIFFERENTIAL/PLATELET    EKG: None  Radiology: No results found.   Ultrasound ED Renal  Date/Time: 04/29/2024 2:43 AM  Performed by: Logan Ubaldo NOVAK, PA-C Authorized by: Logan Ubaldo NOVAK, PA-C   Procedure details:    Indications: urinary retention and urinary tract infection     Technique:  BladderImages: archived Bladder findings:    Bladder:  Visualized   Volume:  87    Medications Ordered in the ED  ciprofloxacin  (CIPRO ) tablet 500 mg (has no administration in time range)                                    Medical Decision Making Amount and/or  Complexity of Data Reviewed Labs: ordered.   This patient presents to the ED for concern of Foley catheter issue, this involves an extensive number of treatment options, and is a complaint that carries with it a high risk of complications and morbidity.  The differential diagnosis includes blockage, infection, others   Co morbidities / Chronic conditions that complicate the patient evaluation  CVA   Additional  history obtained:  Additional history obtained from EMR   Lab Tests:  I Ordered, and personally interpreted labs.  The pertinent results include: Sodium 148, was 144 2 weeks ago.  Normal creatinine.  UA with moderate hemoglobin, moderate leukocytes, greater than 50 WBCs, many bacteria.  Urine culture sent   Problem List / ED Course / Critical interventions / Medication management   I ordered medication including Cipro  Reevaluation of the patient after these medicines showed that the patient stayed the same I have reviewed the patients home medicines and have made adjustments as needed   Social Determinants of Health:  Patient is a daily smoker   Test / Admission - Considered:  Foley catheter was exchanged.  Patient with evidence of catheter related UTI with sample being taken from fresh catheter.  Plan to send urine for culture.  Patient has allergies to Doxy and Keflex.  Will start the patient on Cipro .  No indication for admission or further emergent workup at this time.  Patient stable for discharge back to his facility      Final diagnoses:  Urinary tract infection associated with indwelling urethral catheter, initial encounter    ED Discharge Orders          Ordered    ciprofloxacin  (CIPRO ) 500 MG tablet  Every 12 hours        04/29/24 0247               Logan Ubaldo NOVAK, PA-C 04/29/24 0254    Randol Simmonds, MD 04/30/24 [phone]

## 2024-04-29 NOTE — Discharge Instructions (Signed)
 Please take the prescribed antibiotics for your urinary tract infection.  A urine culture has been sent for further analysis.  If you develop any emergent symptoms please return to the emergency department.

## 2024-05-02 LAB — URINE CULTURE: Culture: >=100000 — AB

## 2024-05-03 ENCOUNTER — Telehealth (HOSPITAL_BASED_OUTPATIENT_CLINIC_OR_DEPARTMENT_OTHER): Payer: Self-pay | Admitting: *Deleted

## 2024-05-03 NOTE — Telephone Encounter (Signed)
 Post ED Visit - Positive Culture Follow-up: Unsuccessful Patient Follow-up  Culture assessed and recommendations reviewed by:  [x]  Prentice Favors, Pharm.D. []  Venetia Gully, Pharm.D., BCPS AQ-ID []  Garrel Crews, Pharm.D., BCPS []  Almarie Lunger, [address].D., BCPS []  [place], [address].D., BCPS, AAHIVP []  Rosaline Bihari, Pharm.D., BCPS, AAHIVP []  Massie Rigg, PharmD []  Jodie Rower, PharmD, BCPS  Positive urine culture  []  Patient discharged without antimicrobial prescription and treatment is now indicated [x]  Organism is resistant to prescribed ED discharge antimicrobial []  Patient with positive blood cultures  Plan per Dr. Ruthe: D/C Ciprofloxacin ; Start Bactrim DS 1 po BID x 7 days and  Amoxicillin  500 mg TID x 7 days. Unable to contact patient after 3 attempts, letter will be sent to address on file  Jason Jones 05/03/2024, 11:40 AM

## 2024-05-03 NOTE — Telephone Encounter (Signed)
 Post ED Visit - Positive Culture Follow-up: Successful Patient Follow-Up  Culture assessed and recommendations reviewed by:  [x]  Prentice Favors, Pharm.D. []  Venetia Gully, [address].D., BCPS AQ-ID []  Garrel Crews, Pharm.D., BCPS []  Almarie Lunger, Pharm.D., BCPS []  [place], [place].D., BCPS, AAHIVP []  Rosaline Bihari, Pharm.D., BCPS, AAHIVP []  Vernell Meier, PharmD, BCPS []  Latanya Hint, PharmD, BCPS []  Donald Medley, PharmD, BCPS []  Rocky Bold, PharmD  Positive urine culture  []  Patient discharged without antimicrobial prescription and treatment is now indicated [x]  Organism is resistant to prescribed ED discharge antimicrobial []  Patient with positive blood cultures  Changes discussed with ED provider: Juliene Bicker, DO New antibiotic prescription: D/C Ciprofloxacin ; Start Bactrim DS 1 tab po BID x 7 days; Start Amoxicillin  500 mg po TID x 7 days. Called and faxed to Dorina, RN at [facility]  Contacted patient's nurse Isa), date 05/03/24, time 1320.   Lorita Barnie Pereyra 05/03/2024, 1:24 PM

## 2024-05-03 NOTE — Progress Notes (Signed)
 ED Antimicrobial Stewardship Positive Culture Follow Up   Jason Jones is an 73 y.o. male who presented to [facility] on 04/28/2024 with a chief complaint of  Chief Complaint  Patient presents with   Urinary Frequency    Recent Results (from the past 720 hours)  Remove and replace urinary cath (placed > 5 days) then obtain urine culture from new indwelling urinary catheter.     Status: Abnormal   Collection Time: 04/12/24  3:48 AM   Specimen: Urine, Catheterized  Result Value Ref Range Status   Specimen Description   Final    URINE, CATHETERIZED Performed at [facility], [address], [place], [place] [address]    Special Requests   Final    NONE Performed at [facility], [address], [place], [place] [address]    Culture (A)  Final    >=100,000 COLONIES/mL ESCHERICHIA COLI Confirmed Extended Spectrum Beta-Lactamase Producer (ESBL).  In bloodstream infections from ESBL organisms, carbapenems are preferred over piperacillin/tazobactam. They are shown to have a lower risk of mortality. >=100,000 COLONIES/mL PROVIDENCIA RETTGERI    Report Status 04/16/2024 FINAL  Final   Organism ID, Bacteria ESCHERICHIA COLI (A)  Final   Organism ID, Bacteria PROVIDENCIA RETTGERI (A)  Final      Susceptibility   Escherichia coli - MIC*    AMPICILLIN >=32 RESISTANT Resistant     CEFAZOLIN  (URINE) Value in next row Resistant      >=32 RESISTANTThis is a modified FDA-approved test that has been validated and its performance characteristics determined by the reporting laboratory.  This laboratory is certified under the Clinical Laboratory Improvement Amendments CLIA as qualified to perform high complexity clinical laboratory testing.    CEFEPIME Value in next row Resistant      >=32 RESISTANTThis is a modified FDA-approved test that has been validated and its performance characteristics determined by the reporting laboratory.  This laboratory is certified  under the Clinical Laboratory Improvement Amendments CLIA as qualified to perform high complexity clinical laboratory testing.    ERTAPENEM Value in next row Sensitive      >=32 RESISTANTThis is a modified FDA-approved test that has been validated and its performance characteristics determined by the reporting laboratory.  This laboratory is certified under the Clinical Laboratory Improvement Amendments CLIA as qualified to perform high complexity clinical laboratory testing.    CEFTRIAXONE  Value in next row Resistant      >=32 RESISTANTThis is a modified FDA-approved test that has been validated and its performance characteristics determined by the reporting laboratory.  This laboratory is certified under the Clinical Laboratory Improvement Amendments CLIA as qualified to perform high complexity clinical laboratory testing.    CIPROFLOXACIN  Value in next row Resistant      >=32 RESISTANTThis is a modified FDA-approved test that has been validated and its performance characteristics determined by the reporting laboratory.  This laboratory is certified under the Clinical Laboratory Improvement Amendments CLIA as qualified to perform high complexity clinical laboratory testing.    GENTAMICIN  Value in next row Sensitive      >=32 RESISTANTThis is a modified FDA-approved test that has been validated and its performance characteristics determined by the reporting laboratory.  This laboratory is certified under the Clinical Laboratory Improvement Amendments CLIA as qualified to perform high complexity clinical laboratory testing.    NITROFURANTOIN Value in next row Sensitive      >=32 RESISTANTThis is a modified FDA-approved test that has been validated and its performance characteristics determined  by the reporting laboratory.  This laboratory is certified under the Clinical Laboratory Improvement Amendments CLIA as qualified to perform high complexity clinical laboratory testing.    TRIMETH/SULFA Value in  next row Sensitive      >=32 RESISTANTThis is a modified FDA-approved test that has been validated and its performance characteristics determined by the reporting laboratory.  This laboratory is certified under the Clinical Laboratory Improvement Amendments CLIA as qualified to perform high complexity clinical laboratory testing.    AMPICILLIN/SULBACTAM Value in next row Resistant      >=32 RESISTANTThis is a modified FDA-approved test that has been validated and its performance characteristics determined by the reporting laboratory.  This laboratory is certified under the Clinical Laboratory Improvement Amendments CLIA as qualified to perform high complexity clinical laboratory testing.    PIP/TAZO Value in next row Sensitive      8 SENSITIVEThis is a modified FDA-approved test that has been validated and its performance characteristics determined by the reporting laboratory.  This laboratory is certified under the Clinical Laboratory Improvement Amendments CLIA as qualified to perform high complexity clinical laboratory testing.    MEROPENEM Value in next row Sensitive      8 SENSITIVEThis is a modified FDA-approved test that has been validated and its performance characteristics determined by the reporting laboratory.  This laboratory is certified under the Clinical Laboratory Improvement Amendments CLIA as qualified to perform high complexity clinical laboratory testing.    * >=100,000 COLONIES/mL ESCHERICHIA COLI   Providencia rettgeri - MIC*    AMPICILLIN Value in next row Sensitive      8 SENSITIVEThis is a modified FDA-approved test that has been validated and its performance characteristics determined by the reporting laboratory.  This laboratory is certified under the Clinical Laboratory Improvement Amendments CLIA as qualified to perform high complexity clinical laboratory testing.    CEFEPIME Value in next row Sensitive      8 SENSITIVEThis is a modified FDA-approved test that has been  validated and its performance characteristics determined by the reporting laboratory.  This laboratory is certified under the Clinical Laboratory Improvement Amendments CLIA as qualified to perform high complexity clinical laboratory testing.    ERTAPENEM Value in next row Sensitive      8 SENSITIVEThis is a modified FDA-approved test that has been validated and its performance characteristics determined by the reporting laboratory.  This laboratory is certified under the Clinical Laboratory Improvement Amendments CLIA as qualified to perform high complexity clinical laboratory testing.    CEFTRIAXONE  Value in next row Sensitive      8 SENSITIVEThis is a modified FDA-approved test that has been validated and its performance characteristics determined by the reporting laboratory.  This laboratory is certified under the Clinical Laboratory Improvement Amendments CLIA as qualified to perform high complexity clinical laboratory testing.    CIPROFLOXACIN  Value in next row Sensitive      8 SENSITIVEThis is a modified FDA-approved test that has been validated and its performance characteristics determined by the reporting laboratory.  This laboratory is certified under the Clinical Laboratory Improvement Amendments CLIA as qualified to perform high complexity clinical laboratory testing.    GENTAMICIN  Value in next row Sensitive      8 SENSITIVEThis is a modified FDA-approved test that has been validated and its performance characteristics determined by the reporting laboratory.  This laboratory is certified under the Clinical Laboratory Improvement Amendments CLIA as qualified to perform high complexity clinical laboratory testing.    NITROFURANTOIN Value in next row Resistant  8 SENSITIVEThis is a modified FDA-approved test that has been validated and its performance characteristics determined by the reporting laboratory.  This laboratory is certified under the Clinical Laboratory Improvement Amendments  CLIA as qualified to perform high complexity clinical laboratory testing.    TRIMETH/SULFA Value in next row Sensitive      8 SENSITIVEThis is a modified FDA-approved test that has been validated and its performance characteristics determined by the reporting laboratory.  This laboratory is certified under the Clinical Laboratory Improvement Amendments CLIA as qualified to perform high complexity clinical laboratory testing.    AMPICILLIN/SULBACTAM Value in next row Sensitive      8 SENSITIVEThis is a modified FDA-approved test that has been validated and its performance characteristics determined by the reporting laboratory.  This laboratory is certified under the Clinical Laboratory Improvement Amendments CLIA as qualified to perform high complexity clinical laboratory testing.    PIP/TAZO Value in next row Resistant      >=128 RESISTANTThis is a modified FDA-approved test that has been validated and its performance characteristics determined by the reporting laboratory.  This laboratory is certified under the Clinical Laboratory Improvement Amendments CLIA as qualified to perform high complexity clinical laboratory testing.    MEROPENEM Value in next row Sensitive      >=128 RESISTANTThis is a modified FDA-approved test that has been validated and its performance characteristics determined by the reporting laboratory.  This laboratory is certified under the Clinical Laboratory Improvement Amendments CLIA as qualified to perform high complexity clinical laboratory testing.    * >=100,000 COLONIES/mL PROVIDENCIA RETTGERI  Urine Culture     Status: Abnormal   Collection Time: 04/29/24  1:57 AM   Specimen: Urine, Catheterized  Result Value Ref Range Status   Specimen Description   Final    URINE, CATHETERIZED Performed at [facility], [address] [place], [place] [address]    Special Requests   Final    NONE Performed at [facility], [address] [place], [place] [address]    Culture (A)  Final    >=100,000 COLONIES/mL ESCHERICHIA COLI Confirmed Extended Spectrum Beta-Lactamase Producer (ESBL).  In bloodstream infections from ESBL organisms, carbapenems are preferred over piperacillin/tazobactam. They are shown to have a lower risk of mortality. 80,000 COLONIES/mL PROVIDENCIA RETTGERI 60,000 COLONIES/mL ENTEROCOCCUS FAECALIS    Report Status 05/02/2024 FINAL  Final   Organism ID, Bacteria ESCHERICHIA COLI (A)  Final   Organism ID, Bacteria PROVIDENCIA RETTGERI (A)  Final   Organism ID, Bacteria ENTEROCOCCUS FAECALIS (A)  Final      Susceptibility   Escherichia coli - MIC*    AMPICILLIN >=32 RESISTANT Resistant     CEFAZOLIN  (URINE) Value in next row Resistant      >=32 RESISTANTThis is a modified FDA-approved test that has been validated and its performance characteristics determined by the reporting laboratory.  This laboratory is certified under the Clinical Laboratory Improvement Amendments CLIA as qualified to perform high complexity clinical laboratory testing.    CEFEPIME Value in next row Resistant      >=32 RESISTANTThis is a modified FDA-approved test that has been validated and its performance characteristics determined by the reporting laboratory.  This laboratory is certified under the Clinical Laboratory Improvement Amendments CLIA as qualified to perform high complexity clinical laboratory testing.    ERTAPENEM Value in next row Sensitive      >=32 RESISTANTThis is a modified FDA-approved test that has been validated and its performance characteristics determined by  the reporting laboratory.  This laboratory is certified under the Clinical Laboratory Improvement Amendments CLIA as qualified to perform high complexity clinical laboratory testing.    CEFTRIAXONE  Value in next row Resistant      >=32 RESISTANTThis is a modified FDA-approved test that has been validated and its performance characteristics determined by the  reporting laboratory.  This laboratory is certified under the Clinical Laboratory Improvement Amendments CLIA as qualified to perform high complexity clinical laboratory testing.    CIPROFLOXACIN  Value in next row Resistant      >=32 RESISTANTThis is a modified FDA-approved test that has been validated and its performance characteristics determined by the reporting laboratory.  This laboratory is certified under the Clinical Laboratory Improvement Amendments CLIA as qualified to perform high complexity clinical laboratory testing.    GENTAMICIN  Value in next row Sensitive      >=32 RESISTANTThis is a modified FDA-approved test that has been validated and its performance characteristics determined by the reporting laboratory.  This laboratory is certified under the Clinical Laboratory Improvement Amendments CLIA as qualified to perform high complexity clinical laboratory testing.    NITROFURANTOIN Value in next row Sensitive      >=32 RESISTANTThis is a modified FDA-approved test that has been validated and its performance characteristics determined by the reporting laboratory.  This laboratory is certified under the Clinical Laboratory Improvement Amendments CLIA as qualified to perform high complexity clinical laboratory testing.    TRIMETH/SULFA Value in next row Sensitive      >=32 RESISTANTThis is a modified FDA-approved test that has been validated and its performance characteristics determined by the reporting laboratory.  This laboratory is certified under the Clinical Laboratory Improvement Amendments CLIA as qualified to perform high complexity clinical laboratory testing.    AMPICILLIN/SULBACTAM Value in next row Resistant      >=32 RESISTANTThis is a modified FDA-approved test that has been validated and its performance characteristics determined by the reporting laboratory.  This laboratory is certified under the Clinical Laboratory Improvement Amendments CLIA as qualified to perform high  complexity clinical laboratory testing.    PIP/TAZO Value in next row Sensitive      8 SENSITIVEThis is a modified FDA-approved test that has been validated and its performance characteristics determined by the reporting laboratory.  This laboratory is certified under the Clinical Laboratory Improvement Amendments CLIA as qualified to perform high complexity clinical laboratory testing.    MEROPENEM Value in next row Sensitive      8 SENSITIVEThis is a modified FDA-approved test that has been validated and its performance characteristics determined by the reporting laboratory.  This laboratory is certified under the Clinical Laboratory Improvement Amendments CLIA as qualified to perform high complexity clinical laboratory testing.    * >=100,000 COLONIES/mL ESCHERICHIA COLI   Enterococcus faecalis - MIC*    AMPICILLIN Value in next row Sensitive      8 SENSITIVEThis is a modified FDA-approved test that has been validated and its performance characteristics determined by the reporting laboratory.  This laboratory is certified under the Clinical Laboratory Improvement Amendments CLIA as qualified to perform high complexity clinical laboratory testing.    NITROFURANTOIN Value in next row Sensitive      8 SENSITIVEThis is a modified FDA-approved test that has been validated and its performance characteristics determined by the reporting laboratory.  This laboratory is certified under the Clinical Laboratory Improvement Amendments CLIA as qualified to perform high complexity clinical laboratory testing.    VANCOMYCIN  Value in next row Sensitive  8 SENSITIVEThis is a modified FDA-approved test that has been validated and its performance characteristics determined by the reporting laboratory.  This laboratory is certified under the Clinical Laboratory Improvement Amendments CLIA as qualified to perform high complexity clinical laboratory testing.    * 60,000 COLONIES/mL ENTEROCOCCUS FAECALIS   Providencia  rettgeri - MIC*    AMPICILLIN Value in next row Resistant      8 SENSITIVEThis is a modified FDA-approved test that has been validated and its performance characteristics determined by the reporting laboratory.  This laboratory is certified under the Clinical Laboratory Improvement Amendments CLIA as qualified to perform high complexity clinical laboratory testing.    CEFEPIME Value in next row Sensitive      8 SENSITIVEThis is a modified FDA-approved test that has been validated and its performance characteristics determined by the reporting laboratory.  This laboratory is certified under the Clinical Laboratory Improvement Amendments CLIA as qualified to perform high complexity clinical laboratory testing.    ERTAPENEM Value in next row Sensitive      8 SENSITIVEThis is a modified FDA-approved test that has been validated and its performance characteristics determined by the reporting laboratory.  This laboratory is certified under the Clinical Laboratory Improvement Amendments CLIA as qualified to perform high complexity clinical laboratory testing.    CEFTRIAXONE  Value in next row Sensitive      8 SENSITIVEThis is a modified FDA-approved test that has been validated and its performance characteristics determined by the reporting laboratory.  This laboratory is certified under the Clinical Laboratory Improvement Amendments CLIA as qualified to perform high complexity clinical laboratory testing.    CIPROFLOXACIN  Value in next row Sensitive      8 SENSITIVEThis is a modified FDA-approved test that has been validated and its performance characteristics determined by the reporting laboratory.  This laboratory is certified under the Clinical Laboratory Improvement Amendments CLIA as qualified to perform high complexity clinical laboratory testing.    GENTAMICIN  Value in next row Sensitive      8 SENSITIVEThis is a modified FDA-approved test that has been validated and its performance characteristics  determined by the reporting laboratory.  This laboratory is certified under the Clinical Laboratory Improvement Amendments CLIA as qualified to perform high complexity clinical laboratory testing.    NITROFURANTOIN Value in next row Resistant      8 SENSITIVEThis is a modified FDA-approved test that has been validated and its performance characteristics determined by the reporting laboratory.  This laboratory is certified under the Clinical Laboratory Improvement Amendments CLIA as qualified to perform high complexity clinical laboratory testing.    TRIMETH/SULFA Value in next row Sensitive      8 SENSITIVEThis is a modified FDA-approved test that has been validated and its performance characteristics determined by the reporting laboratory.  This laboratory is certified under the Clinical Laboratory Improvement Amendments CLIA as qualified to perform high complexity clinical laboratory testing.    AMPICILLIN/SULBACTAM Value in next row Sensitive      8 SENSITIVEThis is a modified FDA-approved test that has been validated and its performance characteristics determined by the reporting laboratory.  This laboratory is certified under the Clinical Laboratory Improvement Amendments CLIA as qualified to perform high complexity clinical laboratory testing.    PIP/TAZO Value in next row Sensitive      <=4 SENSITIVEThis is a modified FDA-approved test that has been validated and its performance characteristics determined by the reporting laboratory.  This laboratory is certified under the Clinical Laboratory Improvement Amendments CLIA as qualified  to perform high complexity clinical laboratory testing.    MEROPENEM Value in next row Sensitive      <=4 SENSITIVEThis is a modified FDA-approved test that has been validated and its performance characteristics determined by the reporting laboratory.  This laboratory is certified under the Clinical Laboratory Improvement Amendments CLIA as qualified to perform high  complexity clinical laboratory testing.    * 80,000 COLONIES/mL PROVIDENCIA RETTGERI    [x]  Treated with Ciprofloxacin , organism resistant to prescribed antimicrobial []  Patient discharged originally without antimicrobial agent and treatment is now indicated  New antibiotic prescription: Bactrim DS 1 tab PO BID x 7 days + Amoxicillin  500 mg TID x7 days  ED Provider: Juliene Bicker, DO   Prentice DOROTHA Favors, PharmD PGY1 [facility] Pharm[facility] Administration and Leadership Resident [facility]  05/03/2024 9:00 AM

## 2024-06-29 ENCOUNTER — Emergency Department (HOSPITAL_COMMUNITY)
Admission: EM | Admit: 2024-06-29 | Discharge: 2024-06-29 | Disposition: A | Discharge status: Skilled Nursing Facility | Attending: Emergency Medicine | Admitting: Emergency Medicine

## 2024-06-29 DIAGNOSIS — Y732 Prosthetic and other implants, materials and accessory gastroenterology and urology devices associated with adverse incidents: Secondary | ICD-10-CM | POA: Insufficient documentation

## 2024-06-29 DIAGNOSIS — N390 Urinary tract infection, site not specified: Secondary | ICD-10-CM | POA: Insufficient documentation

## 2024-06-29 DIAGNOSIS — Z7982 Long term (current) use of aspirin: Secondary | ICD-10-CM | POA: Diagnosis not present

## 2024-06-29 DIAGNOSIS — T83091A Other mechanical complication of indwelling urethral catheter, initial encounter: Secondary | ICD-10-CM | POA: Diagnosis present

## 2024-06-29 DIAGNOSIS — T839XXA Unspecified complication of genitourinary prosthetic device, implant and graft, initial encounter: Secondary | ICD-10-CM

## 2024-06-29 LAB — URINALYSIS, ROUTINE W REFLEX MICROSCOPIC
Bilirubin Urine: NEGATIVE
Glucose, UA: NEGATIVE mg/dL
Ketones, ur: NEGATIVE mg/dL
Nitrite: NEGATIVE
Protein, ur: 100 mg/dL — AB
RBC / HPF: >50 RBC/hpf (ref 0–5)
Specific Gravity, Urine: 1.014 (ref 1.005–1.030)
WBC, UA: >50 WBC/hpf (ref 0–5)
pH: 7 (ref 5.0–8.0)

## 2024-06-29 MED ORDER — SULFAMETHOXAZOLE-TRIMETHOPRIM 800-160 MG PO TABS: 1.0000 | ORAL_TABLET | Freq: Two times a day (BID) | ORAL | 0 refills | Status: AC

## 2024-06-29 MED ORDER — SULFAMETHOXAZOLE-TRIMETHOPRIM 800-160 MG PO TABS
1.0000 | ORAL_TABLET | Freq: Once | ORAL | Status: AC
  Administered 2024-06-29: 1 via ORAL
  Filled 2024-06-29: qty 1

## 2024-06-29 MED ORDER — LIDOCAINE HCL URETHRAL/MUCOSAL 2 % EX GEL
1.0000 | Freq: Once | CUTANEOUS | Status: AC
  Administered 2024-06-29: 1 via URETHRAL
  Filled 2024-06-29: qty 11

## 2024-06-29 NOTE — ED Provider Notes (Signed)
 " [place] EMERGENCY DEPARTMENT AT [facility] Provider Note   CSN: [id number] Arrival date & time: 06/29/24  1240     Patient presents with: No chief complaint on file.   Jason Jones is a 74 y.o. male.   HPI Pt arrives via [facility] from [facility] for a foley catheter problem. Staff reported that he may have had his foley come out yesterday at some point. Unclear of why. Pt has bag and foley in place currently with dark brown, cloudy urine in the bag.   Patient denies other pain, complaints.  No EMS report of hemodynamic stability in transport.    Prior to Admission medications  Medication Sig Start Date End Date Taking? Authorizing Provider  acetaminophen  (TYLENOL ) 500 MG tablet Take 2 tablets (1,000 mg total) by mouth every 8 (eight) hours as needed for mild pain, headache or moderate pain. Patient taking differently: Take 1,000 mg by mouth in the morning and at bedtime. 08/05/22   Will Almarie MATSU, MD  albuterol  (PROVENTIL ) (2.5 MG/3ML) 0.083% nebulizer solution Take 3 mLs (2.5 mg total) by nebulization every 4 (four) hours as needed for wheezing or shortness of breath. 03/28/22   Norrine Sharper, MD  Amino Acids-Protein Hydrolys (PRO-STAT 64 PO) Take 30 mLs by mouth in the morning and at bedtime.    [provider]  aspirin  EC 81 MG tablet Take 1 tablet (81 mg total) by mouth daily. Swallow whole. 07/05/23 07/04/24  Tobie Yetta HERO, MD  atenolol  (TENORMIN ) 25 MG tablet Take 12.5 mg by mouth every 12 (twelve) hours as needed (BP>130).    [provider]  b complex vitamins capsule Take 1 capsule by mouth in the morning.    [provider]  Baclofen  5 MG TABS Take 7.5 mg by mouth in the morning and at bedtime.    [provider]  docusate sodium  (COLACE) 100 MG capsule Take 1 capsule (100 mg total) by mouth 2 (two) times daily. 07/05/23   Tobie Yetta HERO, MD  Emollient (AQUAPHOR OINTMENT BODY EX) Apply 1 application  topically  See admin instructions. Apply to the lower legs and feet every shift    [provider]  ferrous gluconate  (FERGON) 324 MG tablet Take 1 tablet (324 mg total) by mouth daily with breakfast. 12/26/22 06/17/23  Sebastian Toribio GAILS, MD  melatonin 5 MG TABS Take 5 mg by mouth at bedtime.    [provider]  NON FORMULARY Take 120 mLs by mouth See admin instructions. MedPass 2.0 - Drink 120 ml's by mouth three times a day    [provider]  omeprazole (PRILOSEC) 20 MG capsule Take 20 mg by mouth at bedtime.    [provider]  polyethylene glycol (MIRALAX  / GLYCOLAX ) 17 g packet Take 17 g by mouth daily as needed. Patient taking differently: Take 17 g by mouth daily as needed (for constipation). 04/16/22   Akula, Vijaya, MD  polyethylene glycol (MIRALAX ) 17 g packet Take 17 g by mouth 2 (two) times daily as needed (bowel prep for sigmoidoscopy, start 2/22). 07/05/23   Tobie Yetta HERO, MD  rosuvastatin  (CRESTOR ) 20 MG tablet Take 1 tablet (20 mg total) by mouth daily. 03/29/22   Norrine Sharper, MD  senna-docusate (SENOKOT-S) 8.6-50 MG tablet Take 2 tablets by mouth at bedtime.    [provider]  sodium chloride  (OCEAN) 0.65 % SOLN nasal spray Place 1 spray into both nostrils as needed for congestion. 04/16/22   Akula, Vijaya, MD  Allergies: Ceftriaxone  and Doxycycline    Review of Systems  Updated Vital Signs BP 133/83 (BP Location: Right Arm)   Pulse 85   Temp 98.2 F (36.8 C) (Oral)   Resp 16   SpO2 93%   Physical Exam Vitals and nursing note reviewed.  Constitutional:      General: He is not in acute distress.    Appearance: He is well-developed. He is ill-appearing (Chronically ill-appearing).  HENT:     Head: Normocephalic and atraumatic.  Eyes:     Conjunctiva/sclera: Conjunctivae normal.  Cardiovascular:     Rate and Rhythm: Normal rate and regular rhythm.  Pulmonary:     Effort: Pulmonary effort is normal. No respiratory distress.      Breath sounds: No stridor.  Abdominal:     General: There is no distension.  Genitourinary:    Penis: No phimosis or paraphimosis.     Skin:    General: Skin is warm and dry.  Neurological:     Mental Status: He is alert and oriented to person, place, and time.     (all labs ordered are listed, but only abnormal results are displayed) Labs Reviewed  URINALYSIS, ROUTINE W REFLEX MICROSCOPIC - Abnormal; Notable for the following components:      Result Value   APPearance CLOUDY (*)    Hgb urine dipstick MODERATE (*)    Protein, ur 100 (*)    Leukocytes,Ua LARGE (*)    Bacteria, UA MANY (*)    All other components within normal limits    EKG: None  Radiology: No results found.   Procedures   Medications Ordered in the ED  sulfamethoxazole -trimethoprim  (BACTRIM  DS) 800-160 MG per tablet 1 tablet (has no administration in time range)  lidocaine  (XYLOCAINE ) 2 % jelly 1 Application (1 Application Urethral Given 06/29/24 1358)                                    Medical Decision Making Adult male with indwelling Foley catheter presents with drainage of brown urine, decreased, concern for dysfunction versus dysfunction.  Patient is awake, alert, hemodynamically unremarkable.  After nursing was unable to remove the catheter, I was able to do so, Foley was replaced, urine sent for evaluation.  Amount and/or Complexity of Data Reviewed Independent Historian: EMS External Data Reviewed: notes. Labs: ordered. Decision-making details documented in ED Course.  Risk Prescription drug management. Decision regarding hospitalization. Diagnosis or treatment significantly limited by social determinants of health.   3:13 PM Patient in no distress, hemodynamically unremarkable urinalysis with evidence for infection, though this may be artifact.  However,  with new replacement of Foley catheter, malodorous/opaque urine patient will start antibiotics, pending cultures.  According to  prior cultures, last urinalysis Bactrim  sensitive.    Final diagnoses:  Lower urinary tract infectious disease  Complication of Foley catheter, initial encounter     [name], MD 06/29/24 1518  "

## 2024-06-29 NOTE — ED Triage Notes (Signed)
 Pt arrives via [facility] from [facility] for a foley catheter problem. Staff reported that he may have had his foley come out yesterday at some point. Unclear of why. Pt has bag and foley in place currently with dark brown, cloudy urine in the bag.

## 2024-06-29 NOTE — Discharge Instructions (Signed)
 You have been diagnosed with a urinary tract infection.  Take all medication as prescribed.  Be sure to follow-up with your primary care physician and your urologist.

## 2024-06-29 NOTE — ED Notes (Signed)
 Attempted to call for report at facility x 3.
